# Patient Record
Sex: Male | Born: 1960 | Race: White | Hispanic: No | Marital: Married | Smoking: Former smoker
Health system: Southern US, Community
[De-identification: ages and names within clinical notes are randomized; demographics above are authoritative.]

## PROBLEM LIST (undated history)

## (undated) DIAGNOSIS — J449 Chronic obstructive pulmonary disease, unspecified: Secondary | ICD-10-CM

## (undated) DIAGNOSIS — F419 Anxiety disorder, unspecified: Secondary | ICD-10-CM

## (undated) DIAGNOSIS — G4730 Sleep apnea, unspecified: Secondary | ICD-10-CM

## (undated) DIAGNOSIS — K5792 Diverticulitis of intestine, part unspecified, without perforation or abscess without bleeding: Secondary | ICD-10-CM

## (undated) DIAGNOSIS — IMO0002 Reserved for concepts with insufficient information to code with codable children: Secondary | ICD-10-CM

## (undated) DIAGNOSIS — K219 Gastro-esophageal reflux disease without esophagitis: Secondary | ICD-10-CM

## (undated) DIAGNOSIS — R519 Headache, unspecified: Secondary | ICD-10-CM

## (undated) DIAGNOSIS — I639 Cerebral infarction, unspecified: Secondary | ICD-10-CM

## (undated) DIAGNOSIS — K759 Inflammatory liver disease, unspecified: Secondary | ICD-10-CM

## (undated) DIAGNOSIS — R590 Localized enlarged lymph nodes: Secondary | ICD-10-CM

## (undated) DIAGNOSIS — R51 Headache: Secondary | ICD-10-CM

## (undated) DIAGNOSIS — R569 Unspecified convulsions: Secondary | ICD-10-CM

## (undated) DIAGNOSIS — J45909 Unspecified asthma, uncomplicated: Secondary | ICD-10-CM

## (undated) DIAGNOSIS — I10 Essential (primary) hypertension: Secondary | ICD-10-CM

## (undated) HISTORY — PX: CHOLECYSTECTOMY: SHX55

## (undated) HISTORY — PX: ELBOW SURGERY: SHX618

## (undated) HISTORY — DX: Gastro-esophageal reflux disease without esophagitis: K21.9

## (undated) HISTORY — PX: CERVICAL DISC SURGERY: SHX588

## (undated) HISTORY — DX: Essential (primary) hypertension: I10

## (undated) HISTORY — DX: Reserved for concepts with insufficient information to code with codable children: IMO0002

## (undated) HISTORY — DX: Chronic obstructive pulmonary disease, unspecified: J44.9

---

## 2000-01-30 ENCOUNTER — Encounter: Payer: Self-pay | Admitting: Neurosurgery

## 2000-01-30 ENCOUNTER — Ambulatory Visit (HOSPITAL_COMMUNITY): Admission: RE | Admit: 2000-01-30 | Discharge: 2000-01-30 | Payer: Self-pay | Admitting: Neurosurgery

## 2000-08-19 ENCOUNTER — Emergency Department (HOSPITAL_COMMUNITY): Admission: EM | Admit: 2000-08-19 | Discharge: 2000-08-19 | Payer: Self-pay

## 2000-08-19 ENCOUNTER — Encounter: Payer: Self-pay | Admitting: Neurological Surgery

## 2000-09-30 ENCOUNTER — Ambulatory Visit (HOSPITAL_COMMUNITY): Admission: RE | Admit: 2000-09-30 | Discharge: 2000-09-30 | Payer: Self-pay | Admitting: Neurosurgery

## 2000-09-30 ENCOUNTER — Encounter: Payer: Self-pay | Admitting: Neurosurgery

## 2000-10-11 ENCOUNTER — Encounter: Payer: Self-pay | Admitting: Neurosurgery

## 2000-10-13 ENCOUNTER — Encounter: Payer: Self-pay | Admitting: Neurosurgery

## 2000-10-13 ENCOUNTER — Inpatient Hospital Stay (HOSPITAL_COMMUNITY): Admission: RE | Admit: 2000-10-13 | Discharge: 2000-10-15 | Payer: Self-pay | Admitting: Neurosurgery

## 2000-11-03 ENCOUNTER — Encounter: Payer: Self-pay | Admitting: Neurosurgery

## 2000-11-03 ENCOUNTER — Encounter: Admission: RE | Admit: 2000-11-03 | Discharge: 2000-11-03 | Payer: Self-pay | Admitting: Neurosurgery

## 2003-04-21 ENCOUNTER — Encounter: Payer: Self-pay | Admitting: Emergency Medicine

## 2003-04-21 ENCOUNTER — Inpatient Hospital Stay (HOSPITAL_COMMUNITY): Admission: EM | Admit: 2003-04-21 | Discharge: 2003-04-23 | Payer: Self-pay | Admitting: Emergency Medicine

## 2003-04-22 ENCOUNTER — Encounter: Payer: Self-pay | Admitting: Internal Medicine

## 2003-04-22 ENCOUNTER — Encounter: Payer: Self-pay | Admitting: Cardiology

## 2003-12-18 ENCOUNTER — Inpatient Hospital Stay (HOSPITAL_COMMUNITY): Admission: EM | Admit: 2003-12-18 | Discharge: 2003-12-22 | Payer: Self-pay | Admitting: Emergency Medicine

## 2004-05-09 ENCOUNTER — Ambulatory Visit (HOSPITAL_COMMUNITY): Admission: RE | Admit: 2004-05-09 | Discharge: 2004-05-09 | Payer: Self-pay | Admitting: Neurosurgery

## 2004-05-26 ENCOUNTER — Ambulatory Visit (HOSPITAL_COMMUNITY): Admission: RE | Admit: 2004-05-26 | Discharge: 2004-05-26 | Payer: Self-pay | Admitting: Neurosurgery

## 2004-06-22 ENCOUNTER — Ambulatory Visit (HOSPITAL_COMMUNITY): Admission: RE | Admit: 2004-06-22 | Discharge: 2004-06-22 | Payer: Self-pay | Admitting: Pulmonary Disease

## 2004-06-24 ENCOUNTER — Ambulatory Visit (HOSPITAL_COMMUNITY): Admission: RE | Admit: 2004-06-24 | Discharge: 2004-06-24 | Payer: Self-pay | Admitting: Pulmonary Disease

## 2004-10-27 ENCOUNTER — Emergency Department (HOSPITAL_COMMUNITY): Admission: EM | Admit: 2004-10-27 | Discharge: 2004-10-28 | Payer: Self-pay | Admitting: Emergency Medicine

## 2006-05-01 ENCOUNTER — Ambulatory Visit (HOSPITAL_COMMUNITY): Admission: RE | Admit: 2006-05-01 | Discharge: 2006-05-01 | Payer: Self-pay | Admitting: Pulmonary Disease

## 2006-05-11 ENCOUNTER — Ambulatory Visit (HOSPITAL_COMMUNITY): Admission: RE | Admit: 2006-05-11 | Discharge: 2006-05-11 | Payer: Self-pay | Admitting: Pulmonary Disease

## 2006-08-02 ENCOUNTER — Ambulatory Visit: Payer: Self-pay | Admitting: Cardiology

## 2006-11-24 ENCOUNTER — Emergency Department (HOSPITAL_COMMUNITY): Admission: EM | Admit: 2006-11-24 | Discharge: 2006-11-24 | Payer: Self-pay | Admitting: Emergency Medicine

## 2006-11-27 ENCOUNTER — Ambulatory Visit: Payer: Self-pay | Admitting: Orthopedic Surgery

## 2006-12-11 ENCOUNTER — Ambulatory Visit: Payer: Self-pay | Admitting: Orthopedic Surgery

## 2008-04-29 ENCOUNTER — Ambulatory Visit (HOSPITAL_COMMUNITY): Admission: RE | Admit: 2008-04-29 | Discharge: 2008-04-29 | Payer: Self-pay | Admitting: Family Medicine

## 2009-11-12 ENCOUNTER — Ambulatory Visit (HOSPITAL_COMMUNITY): Admission: RE | Admit: 2009-11-12 | Discharge: 2009-11-12 | Payer: Self-pay | Admitting: Family Medicine

## 2010-10-03 ENCOUNTER — Encounter: Payer: Self-pay | Admitting: Neurosurgery

## 2010-11-17 ENCOUNTER — Ambulatory Visit (HOSPITAL_COMMUNITY)
Admission: RE | Admit: 2010-11-17 | Discharge: 2010-11-17 | Disposition: A | Discharge status: Home / Self Care | Payer: Medicare Other | Source: Ambulatory Visit | Attending: Internal Medicine | Admitting: Internal Medicine

## 2010-11-17 ENCOUNTER — Ambulatory Visit (INDEPENDENT_AMBULATORY_CARE_PROVIDER_SITE_OTHER): Payer: Medicare Other | Admitting: Internal Medicine

## 2010-11-17 ENCOUNTER — Other Ambulatory Visit (INDEPENDENT_AMBULATORY_CARE_PROVIDER_SITE_OTHER): Payer: Self-pay | Admitting: Internal Medicine

## 2010-11-17 DIAGNOSIS — R1310 Dysphagia, unspecified: Secondary | ICD-10-CM

## 2010-11-29 ENCOUNTER — Encounter (HOSPITAL_BASED_OUTPATIENT_CLINIC_OR_DEPARTMENT_OTHER): Payer: Medicare Other | Admitting: Internal Medicine

## 2010-11-29 ENCOUNTER — Ambulatory Visit (HOSPITAL_COMMUNITY)
Admission: RE | Admit: 2010-11-29 | Discharge: 2010-11-29 | Disposition: A | Discharge status: Home / Self Care | Payer: Medicare Other | Source: Ambulatory Visit | Attending: Internal Medicine | Admitting: Internal Medicine

## 2010-11-29 DIAGNOSIS — Z79899 Other long term (current) drug therapy: Secondary | ICD-10-CM | POA: Insufficient documentation

## 2010-11-29 DIAGNOSIS — I10 Essential (primary) hypertension: Secondary | ICD-10-CM | POA: Insufficient documentation

## 2010-11-29 DIAGNOSIS — R1310 Dysphagia, unspecified: Secondary | ICD-10-CM

## 2010-11-29 DIAGNOSIS — J449 Chronic obstructive pulmonary disease, unspecified: Secondary | ICD-10-CM | POA: Insufficient documentation

## 2010-11-29 DIAGNOSIS — K2980 Duodenitis without bleeding: Secondary | ICD-10-CM | POA: Insufficient documentation

## 2010-11-29 DIAGNOSIS — J4489 Other specified chronic obstructive pulmonary disease: Secondary | ICD-10-CM | POA: Insufficient documentation

## 2010-11-29 DIAGNOSIS — E119 Type 2 diabetes mellitus without complications: Secondary | ICD-10-CM | POA: Insufficient documentation

## 2010-11-29 DIAGNOSIS — K2970 Gastritis, unspecified, without bleeding: Secondary | ICD-10-CM | POA: Insufficient documentation

## 2010-11-30 LAB — H. PYLORI ANTIBODY, IGG: H Pylori IgG: <0.4 {ISR}

## 2010-12-14 NOTE — Op Note (Signed)
  NAMEHAYWOOD, [name]               ACCOUNT NO.:  [id number]  MEDICAL RECORD NO.:  [id number]           PATIENT TYPE:  O  LOCATION:  DAYP                          FACILITY:  [facility]  PHYSICIAN:  Lionel December, M.D.    DATE OF BIRTH:  1960-12-15  DATE OF PROCEDURE:  11/29/2010 DATE OF DISCHARGE:                              OPERATIVE REPORT   PROCEDURE:  Esophagogastroduodenoscopy with esophageal dilation.   INDICATIONS:  [name] is a 50 year old Caucasian male with multiple medical problems who has dysphagia primarily to solids.  He had barium study recently which did not show any esophageal abnormality.  The patient is on NSAID therapy among other things.  He is undergoing diagnostic and possibly therapeutic EGD.  Procedure risks were reviewed with the patient and informed consent was obtained.  MEDICATIONS FOR CONSCIOUS SEDATION:  Cetacaine spray for pharyngeal topical anesthesia, Demerol 50 mg IV, and Versed 6 mg IV.  FINDINGS:  Procedure performed in the Endoscopy Suite.  The patient's vital signs and O2 sat were monitored during the procedure and remained stable.  The patient was placed in left lateral recumbent position. Pentax videoscope was passed via oropharynx into laryngopharynx beyond. Laryngeal inlet appeared to be normal with very symmetrical vocal cords.  Esophagus:  Mucosa of the esophagus normal.  There was focal erythema at GE junction which was located at 39 cm from the incisors, but no ring or stricture was noted.  Hiatus was at 41.  Stomach:  It was empty and distended, very well insufflation.  Folds of proximal stomach were normal.  Examination of mucosa at body was normal. There were multiple antral erosions along with 3-4 mm prepyloric ulcer which appeared to be healing.  There was also edema and erythema to mucosa of pyloric channel, but it was patent.  Angularis, fundus, and cardia were examined by retroflexion of scope and were normal.  Duodenum:  Few  scattered bulbar erosions were noted, but no ulcer crater was present.  Scope was passed in second part of duodenal mucosa and folds were normal.  Endoscope was withdrawn.  Esophageal dilation was performed by passing 56-French Maloney dilator full insertion.  Esophageal mucosa reexamined postdilation and no disruption noted.  The patient tolerated the procedure well.  Small sliding hiatal hernia with mild changes of esophagitis limited to GE junction.  No evidence of the ring, stricture or web.  Nevertheless esophagus dilated by passing 56-French [facility] dilator.  Erosive antral gastritis and bulbar duodenitis along with 3-4 mm prepyloric ulcer.  Suspect this may be due to NSAID therapy, but need to rule out H. pylori infection.  RECOMMENDATIONS:  Antireflux measures.  The patient is advised to cut back on the dose of naproxen at least on Sundays.  We will check his H. pylori serology today.  Pantoprazole 40 mg p.o. q.a.m., prescription given for 30 with 11 refills.     Lionel December, M.D.     NR/MEDQ  D:  11/29/2010  T:  11/29/2010  Job:  [id number]  Electronically Signed by Lionel December M.D. on 12/14/2010 12:44:54 PM

## 2010-12-14 NOTE — Consult Note (Signed)
[name], [name]               ACCOUNT NO.:  [id number]  MEDICAL RECORD NO.:  [id number]           PATIENT TYPE:  LOCATION:                                 FACILITY:  PHYSICIAN:  Lionel December, M.D.    DATE OF BIRTH:  [birth year]  DATE OF CONSULTATION: DATE OF DISCHARGE:                                CONSULTATION   HISTORY OF PRESENT ILLNESS:  [name] is a 50 year old male referred to our office by Dr. Regino Schultze for dysphagia.  [name] [name] states he has had symptoms of dysphagia for a couple of years.  His symptoms have been worse in the past 6 months.  He states he is choking checking on his vitamins and meats are lodging.  He has to cough the bolus back up. Foods are lodging in his upper esophagus.  Please note, he has had neck surgery x4 in the past.  He complains of a lot of pressure in his upper esophagus.  He says dysphagia is occurring with most every meal.  He has had a 10-15 pound weight loss over the past 6 months, but is gradually putting it back on.  He says his appetite is good.  He does, however, state he at times chokes on his saliva.  He occasionally has heartburn. No fever or fatigue.  ALLERGIES:  No known allergies.  HOME MEDICATIONS: 1. Ventolin 2 puffs as needed. 2. Xanax 2 mg 1 tablet every 6 hours as needed. 3. OxyContin 40 mg 1 tablet 3 times a day. 4. Oxycodone 15 mg every 4 hours as needed. 5. Carbamazepine 200 mg 1 tablet twice a day. 6. Cipro 500 mg 1 tablet twice a day for prostatitis. 7. Prednisone 5 mg a day. 8. Lisinopril 10 mg a day. 9. Tizanidine 4 mg 1 tablet 3 times a day. 10.Naproxen 500 mg twice a day. 11.Spiriva nebulizer 18 mcg once a day.  SURGERIES:  He has had neck surgery x4.  He has had a cholecystectomy and he had a joint removed from his right elbow.  MEDICAL HISTORY: 1. Hypertension. 2. COPD. 3. Chronic back pain. 4. Prostatitis. 5. Seizures.  FAMILY HISTORY:  His mother is alive in good health.  His father is alive  in good health with hypertension.  Two sisters in good health.  SOCIAL HISTORY:  He is married.  He does, however, have a good relationship with his ex-wife.  He smokes 1 pack a day for 40 years.  He rarely drinks alcohol and he has 3 children in good health.  OBJECTIVE:  VITAL SIGNS:  His weight is 184.  His height is 5 feet and 8 inches.  His temperature is 98.5.  his blood pressure is 160/94.  His pulse is 80. HEENT:   His conjunctivae is pink. His sclerae are anicteric. NECK:  His thyroid is normal.  There is no cervical lymphadenopathy. LUNGS:  Clear. HEART:  Regular rate and rhythm. ABDOMEN:  Soft.  Bowel sounds are positive.  No masses. EXTREMITIES:  There is no edema to his extremities.  LABORATORY DATA:  On September 17, 2010, his hemoglobin was 15.8 and hematocrit 46.8.  ASSESSMENT:  [name] [name] is a 50 year old male presenting today with complaints of dysphagia to solids and sometimes liquids.  He also has pill dysphagia.  This has been occurring for over 2 years which has progressively worsened over the last 6 months, with weight loss.  RECOMMENDATIONS:  I did discuss this case with Dr. Karilyn Cota.  We will get a barium pill study on him and further recommendations once we have the study back and he will be scheduled today for this.    ______________________________ Dorene Ar, NP   ______________________________ Lionel December, M.D.    TS/MEDQ  D:  11/17/2010  T:  11/18/2010  Job:  [id number]  cc:   Kirk Ruths, M.D. Fax: [phone]  Electronically Signed by Dorene Ar PA on 12/03/2010 11:40:39 AM Electronically Signed by Lionel December M.D. on 12/14/2010 12:43:59 PM

## 2011-01-28 NOTE — Consult Note (Signed)
NAME:  [name] [name], [name] [name]                         ACCOUNT NO.:  [id number]   MEDICAL RECORD NO.:  [id number]                   PATIENT TYPE:  INP   LOCATION:  3731                                 FACILITY:  [facility]   PHYSICIAN:  Meade Maw, M.D.                 DATE OF BIRTH:  05/24/1961   DATE OF CONSULTATION:  DATE OF DISCHARGE:                                   CONSULTATION   PRIMARY CARE PHYSICIAN:  Donia Guiles, M.D.   INDICATION FOR CONSULT:  Chest pain.   HISTORY OF PRESENT ILLNESS:  [name] [name] is a 50 year old gentleman who presented  to the emergency room with ongoing substernal chest pain.  He notes that the  first episode of chest pain actually occurred approximately eight months  prior to this presentation.  It resolved in approximately one-and-a-half  hours spontaneously and was described as a 3/10 in severity.  He had a  subsequent episode on Sunday which persisted for about 45 minutes,  associated with dizziness and resolved spontaneously in one-and-a-half  hours.  He subsequently had another episode which was more severe in nature  and prompted his presentation to the ER.  There was again associated  dizziness and diaphoresis, but no nausea or vomiting.  His coronary risk  factors are significant for male sex, equivocal family history (father had  myocardial infarction at age 52), and ongoing tobacco use.  His cholesterol  status is unknown.  There is no history of hypertension or diabetes.   PAST MEDICAL HISTORY:  Significant for sleep apnea which was diagnosed in  2001.   PAST SURGICAL HISTORY:  Significant for:  1. Cervical fusion in 1995, 1997, and 1998.  2. Status post cholecystectomy in 1994.   CURRENT MEDICATIONS:  1. Vioxx 25 mg daily.  2. Valium 5 mg p.r.n.   ALLERGIES:  He has no known drug allergies.   FAMILY HISTORY:  His father had myocardial infarction at age 86.  Two  sisters are in good health.   SOCIAL HISTORY:  He continues to smoke one pack  per day.  Occasional  alcohol.  He has been disabled since 1995 and is not involved in any regular  exercise program.   REVIEW OF SYSTEMS:  He has noted no significant gastritis.  Denies nausea  and vomiting.  No change in his stool.  Review of systems is otherwise  negative.   PHYSICAL EXAMINATION:  GENERAL APPEARANCE:  A 50 year old male appearing  older than his stated age.  VITAL SIGNS:  The blood pressure is 110-140 systolic and in the 70s  diastolic.  The heart rate is 60.  His O2 saturation is 100% on 1.5 L.  HEENT:  He has limited range of motion of his neck.  There is no neck vein  distention.  No carotid bruits are noted.  CARDIOVASCULAR:  Regular rate and rhythm.  Normal S1 and S2.  No murmurs,  rubs, or gallops are noted.  His PMI is not displaced.  ABDOMEN:  Soft, benign, and nontender.  No unusual bruits or pulsations are  noted.  EXTREMITIES:  No peripheral edema.  His distal pulses are equal and  palpable.   LABORATORY DATA:  His ECG reveals a sinus rhythm, normal axis, normal R-wave  progression, and no ST changes.  Serial cardiac enzymes have been negative.  His white count is 7.6 and hemoglobin 15.  He has normal electrolytes.   IMPRESSION:  1. A 50 year old gentleman with recurrent chest pain.  Limited risk factors.     The chest pain has been relieved, however, during this admission with     sublingual nitroglycerin.  In that he has normal EKG and normal cardiac     enzymes, we will schedule for an adenosine Cardiolite for further     evaluation.  The patient is unable to ambulate on the treadmill secondary     to his neck fusion.  I agree with Pepcid 20 mg p.o. b.i.d.  Will continue     with Lovenox and aspirin as you have ordered.  2. Ongoing tobacco use.  Smoking cessation was strongly discussed.  Will     obtain a smoking cessation consult as well.  3. Health maintenance.  The patient will need a fasting lipid profile.                                                Meade Maw, M.D.    HP/MEDQ  D:  04/22/2003  T:  04/22/2003  Job:  [id number]

## 2011-01-28 NOTE — H&P (Signed)
[place]  Patient:    [name] [name], [name] [name]                      MRN: [id number] Adm. Date:  [id number] Attending:  Armanda Heritage CC:         [facility]   History and Physical  EMERGENCY ROOM VISIT  CHIEF COMPLAINT:  Larey Seat in the bathtub this morning.  HISTORY OF PRESENT ILLNESS:  The patient is a 50 year old right-handed individual who has had previous neck surgeries, the last being in 1999.  He has a C4-5 arthrodesis with a plate fixation (performed by Dr. Payton Doughty). He also has other arthrodesis in the lower cervical spine.  The patient notes that he slipped and fell in the bathtub, hitting the left side of his head.  He complains of pain in the region of the right shoulder and the interscapular region on the right side, as well as some occasional dysesthesias in the right arm and hand.  He contacted me this afternoon and I advised the patient to come in for some x-rays of his neck.  He notes that his neck is particularly tender in the posterior aspect of the neck and down to the region of both shoulders, and the interscapular area.  He denies any lower extremity dysfunction or any problems with bowel or bladder control.  PAST MEDICAL HISTORY:  Notable that the patient is generally healthy.  He reports no significant medical problems, no chronic medication usage.  PHYSICAL EXAMINATION:  GENERAL:  Reveals an alert and oriented individual, in no overt distress.  NECK:  Tenderness to palpation in the right parascapular areas and interscapular region; also on the right midline portion of the neck.  NEUROLOGIC:  Motor strength in the upper extremities reveals the deltoids, triceps, biceps grips to be 4/5 on the right side and 5/5 on the left.  The deep tendon reflexes are 2+ in the biceps, 1+ in the triceps, 2+ in the patellae, 1+ in the Achilles.  Babinski downgoing.  Station and gait are normal.  Cranial nerve  examination is within normal limits.  RADIOGRAPHIC:  The examination reveals that his arthrodesis from C4 on down appears to be stable.  No abnormal motion is detected.  However, on flexion and extension films the patient does not allow for significant motion in the upper portions of his cervical spine.  RECOMMENDATION:  I advised at this time that the patient maintain himself in the cervical collar.  He has Vicodin and Valium at home for pain medication and muscle relaxer.  He will be in contact with the office during this coming week, if by Tuesday he does not have significant relief of his symptoms or if he feels the symptoms are worsening. DD:  08/19/00 TD:  08/20/00 Job: [id number]

## 2011-01-28 NOTE — Group Therapy Note (Signed)
NAME:  [name] [name], [name] [name]                         ACCOUNT NO.:  [id number]   MEDICAL RECORD NO.:  [id number]                   PATIENT TYPE:  INP   LOCATION:  A213                                 FACILITY:  [facility]   PHYSICIAN:  Edward L. Juanetta Gosling, M.D.             DATE OF BIRTH:  Sep 16, 1960   DATE OF PROCEDURE:  12/20/2003  DATE OF DISCHARGE:                                   PROGRESS NOTE   PROBLEM:  Chronic obstructive pulmonary disease with exacerbation.   SUBJECTIVE:  [name] says that he is better, but still having a lot of  problems with cough, congestion, etc.   OBJECTIVE:  His exam today shows that his temperature is 97.4, pulse 103,  respirations 18, blood pressure 137/76.  O2 saturation 95% on 2-1/2 liters.  His chest shows rhonchi and it makes him cough when he takes a deep breath.   ASSESSMENT:  He is about the same.   PLAN:  Continue treatments including his O2 nebulizer treatments, steroids,  and antibiotics.      ___________________________________________                                            Oneal Deputy Juanetta Gosling, M.D.   ELH/MEDQ  D:  12/20/2003  T:  12/20/2003  Job:  [id number]

## 2011-01-28 NOTE — H&P (Signed)
NAME:  [name] [name], [name] [name]                         ACCOUNT NO.:  [id number]   MEDICAL RECORD NO.:  [id number]                   PATIENT TYPE:  INP   LOCATION:  A213                                 FACILITY:  [facility]   PHYSICIAN:  Tesfaye D. Felecia Shelling, M.D.              DATE OF BIRTH:  Jul 21, 1961   DATE OF ADMISSION:  12/18/2003  DATE OF DISCHARGE:                                HISTORY & PHYSICAL   CHIEF COMPLAINT:  Chest pain.   HISTORY OF PRESENT ILLNESS:  This is a 50 year old male patient with a  history of seizure disorder and bronchitis.  He came to the emergency room  with the above complaint.  The patient claims that he had a severe chest  pain since last three days.  The pain is in his mid sternal area.  The  patient feels like something is sitting on his chest.  He has also tightness  and difficulty breathing.  The patient has intermittent cough with yellowish  phlegm.  He has a history of tobacco smoking for several years.  The patient  had a similar episode in July of last year for which he was admitted to  [facility] and was evaluated.  The patient was diagnosed as a  possible case of bronchitis with a possible bronchospasm, and esophagitis.  He was discharged for Protonix 40 mg p.o. daily.  However, the patient is  not currently taking the medication.  He continued to smoke cigarettes  heavily.  The patient was evaluated in the emergency room and a CT scan of  the chest was done to rule out thoracic aneurysm.  His CT scan of the chest  was negative.  The patient had cardiac enzymes and EKG which was also  negative.  The patient felt better after he was given nitroglycerin and  oxygen in the emergency room.  The patient was then admitted for further  evaluation and treatment.   REVIEW OF SYSTEMS:  The patient has no headache, fever, chills or abdominal  pain.  No nausea, dysuria, urgency, or frequency of urination.  The patient  complains of tightness of the chest, cough  and intermittent wheezing.   PAST MEDICAL HISTORY:  1. History of recurrent chest pain.  2. History of esophagitis.  3. Bronchitis.  4. Seizure disorder.  5. C3-C4 disk prolapse.   CURRENT MEDICATIONS:  1. Carbatrol 200 mg p.o. daily.  2. Valium p.r.n.  3. Vicodin 5/500, one tablet p.o. p.r.n.   SOCIAL HISTORY:  The patient has been smoking for over 35 years.  The  patient denies any history of alcohol or substance abuse.   FAMILY HISTORY:  The patient's father died at the age of 33 due to  myocardial infarction.  His mother has no cardiac history.   PHYSICAL EXAMINATION:  GENERAL:  The patient is alert, awake, sick looking.  VITAL SIGNS:  Blood pressure 113/77, pulse 85, respiratory rate  24,  temperature 98 degrees Fahrenheit.  HEENT:  Pupils are equal and reactive.  NECK:  Supple.  CHEST:  Decreased air entry, bilateral rhonchi and expiratory wheezes.  CARDIOVASCULAR:  First and second heart sounds heard, no murmur, no gallop.  ABDOMEN:  Soft, relaxed.  Bowel sounds positive.  No mass, no organomegaly.  EXTREMITIES:  No leg edema.   LABORATORY DATA:  CBC:  WBC 6.4, hemoglobin 17.0, hematocrit 38.4.  Platelets 241.  Sodium 136, potassium 3.7, chloride 106, carbon dioxide 22,  glucose 104, BUN 11, creatinine 1.0.  CPK 86, CK-MB 1.2, and troponin less  than 0.01.   ASSESSMENT:  1. Chest pain, etiology unclear, rule out unstable angina.  However,     possibility of gastroesophageal reflux disease with esophagitis has to be     considered.  2. Possible chronic obstructive pulmonary disease.  3. History of seizure disorder.  4. History of discogenic disease.  5. Nicotine addition.   PLAN:  1. We will continue serial EKG's and cardiac enzymes.  2. We will start the patient on nebulizer treatment and oral antibiotics.  3. We will start the patient also on proton pump inhibitors.  4. We will continue the patient on his regular medications.      ___________________________________________                                         Eustaquio Maize Felecia Shelling, M.D.   TDF/MEDQ  D:  12/18/2003  T:  12/19/2003  Job:  [id number]

## 2011-01-28 NOTE — Discharge Summary (Signed)
NAME:  [name] [name], BHATNAGAR                         ACCOUNT NO.:  [id number]   MEDICAL RECORD NO.:  [id number]                   PATIENT TYPE:  INP   LOCATION:  A213                                 FACILITY:  [facility]   PHYSICIAN:  Edward L. Juanetta Gosling, M.D.             DATE OF BIRTH:  1961/01/19   DATE OF ADMISSION:  12/18/2003  DATE OF DISCHARGE:                                 DISCHARGE SUMMARY   FINAL DISCHARGE DIAGNOSES:  1. Chronic obstructive pulmonary disease with exacerbation.  2. Chest pain, secondary to #1.  3. Myocardial infarction ruled out.  4. Seizure disorder.  5. Chronic back pain.  6. Gastroesophageal reflux disease.  7. C3-C4 disk prolapse.   HISTORY:  This is a 50 year old with a history of seizure disorder and  bronchitis.  He came to the emergency room because of chest pain for about 3  days which is in the midsternal area and feels like someone is sitting on  his chest.  He had a similar episode in July 2004 and was worked up and had  no evidence of myocardial infarction or of coronary disease at all.  He was  felt to have bronchitis with bronchospasm and esophagitis discharged on  Protonix which he stopped.  He has continued to smoke about 3 packs of  cigarettes daily.   PHYSICAL EXAMINATION:  GENERAL:  His physical exam on admission showed that  he is alert, awake, acute ill.  VITAL SIGNS:  The blood pressure 113/77, pulse is 85, respirations 24,  temperature 98.  CHEST:  His chest showed decreased breath sounds with some rhonchi and  wheezes.  HEART:  Heart is regular without gallop.  ABDOMEN:  Soft.  EXTREMITIES:  Extremities showed no edema.   LAB WORK:  Showed that his white count is 6400, hemoglobin 17.  Electrolytes  are normal.  CK 86, MB of 1.2, troponin less than 0.01.   HOSPITAL COURSE:  He was ruled out for myocardial infarction with serial  cardiac enzymes.  He was placed on intravenous antibiotics, IV steroids and  inhaled bronchodilators and  improved.  He remained hypoxic; however, and is  discharged home.   DISCHARGE MEDICATIONS:  1. Levaquin 500 mg daily x7 days.  2. A Medrol Dosepak.  3. Albuterol and Atrovent nebulizer treatments 4 times a day.  4. O2 at 2-1/2 to 3 liters.  He is going to continue his previous medications which include:  1. Carbatrol 200 mg daily.  2. Valium 10 mg up to 6 times a day.  3. Hydrocodone q.4h. p.r.n.   DISCHARGE INSTRUCTIONS:  He is going to follow up in my office in about a  month.     ___________________________________________  Edward L. Juanetta Gosling, M.D.   ELH/MEDQ  D:  12/22/2003  T:  12/22/2003  Job:  [id number]

## 2011-01-28 NOTE — Group Therapy Note (Signed)
NAME:  AMED, DATTA                         ACCOUNT NO.:  [id number]   MEDICAL RECORD NO.:  [id number]                   PATIENT TYPE:  INP   LOCATION:  A213                                 FACILITY:  [facility]   PHYSICIAN:  Edward L. Juanetta Gosling, M.D.             DATE OF BIRTH:  July 13, 1961   DATE OF PROCEDURE:  DATE OF DISCHARGE:                                   PROGRESS NOTE   PROBLEM:  Chronic obstructive pulmonary disease with exacerbation.   SUBJECTIVE:  Mr. Orourke continues to cough and be congested, but overall  feels better and wants to go home.  He has no other new complaints today.  His chest pain has resolved.   PHYSICAL EXAMINATION:  GENERAL:  He still appears to be mildly dyspneic, but  he says that he is back to baseline for him.  VITAL SIGNS:  Temperature 98.9; pulse 97; respirations 20; blood pressure  130/76; O2 sats 97% on three liters, but 87% on room air.  CHEST:  Clearer, but still with some rhonchi.  HEART:  Regular.  ABDOMEN:  Soft.   ASSESSMENT:  He is better.   PLAN:  He is going to be discharged home today.  Please see discharge  summary for details.      ___________________________________________                                            Oneal Deputy Juanetta Gosling, M.D.   ELH/MEDQ  D:  12/22/2003  T:  12/22/2003  Job:  [id number]

## 2011-01-28 NOTE — H&P (Signed)
NAME:  [name] [name], [name] [name]                         ACCOUNT NO.:  [id number]   MEDICAL RECORD NO.:  [id number]                   PATIENT TYPE:  INP   LOCATION:  3731                                 FACILITY:  [facility]   PHYSICIAN:  Sherin Quarry, MD                   DATE OF BIRTH:  1960/10/13   DATE OF ADMISSION:  04/21/2003  DATE OF DISCHARGE:                                HISTORY & PHYSICAL   PROBLEM LIST:  1. Chest pain, rule out myocardial infarction.  2. A 35 pack year smoking history.  3. Family history of myocardial infarction.  4. History of four previous cervical laminectomies, most recently in 2001.   HISTORY OF PRESENT ILLNESS:  This 50 year old man reports that on the night  prior to admission before going to sleep he noted the sudden onset of  midsternal chest pain associated with dizziness that lasted for about 1-1/2  hours and then resolved spontaneously.  He felt slightly nauseated during  this period.  He went to sleep and felt well when he got up.  Then this  evening while riding in a car, he noted the acute recurrence of chest pain  with associated diaphoresis and dizziness again described as substernal.  This episode lasted about 45 minutes, and resulted in his wife bringing him  to the emergency room where his evaluation has included normal labs, normal  enzymes, and negative electrocardiogram.  He is admitted to rule out  myocardial infarction.  His wife reports that about eight months prior to  this presentation, he had an episode of chest pain, also which apparently  was not associated with exertion, and lasted for about 30 to 45 minutes.  He  mentioned this to his wife, but did not want to see a doctor about it.   ALLERGIES:  No known drug allergies.   CURRENT MEDICATIONS:  1. Vioxx 25 mg daily.  2. Valium 5 mg p.r.n. for anxiety.  3. Occasional multivitamins.   PAST MEDICAL HISTORY:  He has had no serious illnesses.   PAST SURGICAL HISTORY:  The patient  reports he has had four previous  cervical laminectomies performed by Dr. Channing Mutters, most recently in 2001.   FAMILY HISTORY:  The patient's father had a MI at age 52 and underwent a  stent.  His brother and sister are said to be in good health.  His mother  does not have any history of cardiac disease.   SOCIAL HISTORY:  The patient smokes one pack of cigarettes a day.  He denies  alcohol or drug abuse.  He has been disabled since 1995, due to neck  problems.   REVIEW OF SYSTEMS:  HEAD:  He denies headache or dizziness.  EYES:  He  denies visual blurring or diplopia.  EARS, NOSE, AND THROAT:  He denies  earaches, sinus pain, or sore throat.  CHEST:  Denies coughing or wheezing.  He has not noticed any previous history of dyspnea on exertion.  CARDIOVASCULAR:  See above.  GASTROINTESTINAL:  There has been no recent  history of indigestion or heartburn.  No melena or hematochezia.  GENITOURINARY:  Denies dysuria or urinary frequency.  RHEUMATOLOGIC:  Denies  back pain or joint pain.  HEMATOLOGIC:  He denies easy bleeding or bruising.  NEUROLOGIC:  He denies history of seizure or stroke.   PHYSICAL EXAMINATION:  HEENT:  Within normal limits.  NECK:  The carotids are 2+ without bruits.  CHEST:  Clear.  BACK:  No severe point tenderness.  CARDIOVASCULAR:  Normal S1 and S2.  There are no murmurs, rubs, or gallops.  ABDOMEN:  Benign, normal bowel sounds without masses, tenderness, or  organomegaly.  NEUROLOGIC:  Normal.  EXTREMITIES:  Normal.   LABORATORY DATA:  Laboratory studies were reviewed and included negative  cardiac enzymes.  Electrocardiogram was within normal limits.   ASSESSMENT AND PLAN:  The patient will be admitted to rule out myocardial  infarction.  He is instructed in the importance of discontinuing cigarette  smoking.  A Cardiolite stress will be arranged if enzymes are negative to  further evaluate his heart status, and lipid profile will also be checked.                                                 Sherin Quarry, MD    SY/MEDQ  D:  04/21/2003  T:  04/22/2003  Job:  [id number]   cc:   Donia Guiles, M.D.  [address] [place]  [place] [address]  Fax: [phone]   Lyn Records III, M.D.  [address]  [place]  [place] [address]  Fax: [phone]

## 2011-01-28 NOTE — Procedures (Signed)
NAMEKAIYON, HYNES               ACCOUNT NO.:  [id number]   MEDICAL RECORD NO.:  [id number]          PATIENT TYPE:  OUT   LOCATION:  RESP                          FACILITY:  [facility]   PHYSICIAN:  Edward L. Juanetta Gosling, M.D.DATE OF BIRTH:  Jan 08, 1961   DATE OF PROCEDURE:  06/24/2004  DATE OF DISCHARGE:                              PULMONARY FUNCTION TEST   RESULTS:  1.  Spirometry shows a severe ventilatory defect with evidence of air flow      obstruction.  2.  Lung volumes show reduction in total lung capacity suggesting a separate      restrictive change.  3.  DLCO is near normal which makes me wonder if the total lung capacity      measurement is accurate.  4.  There is significant response to inhaled bronchodilator.  5.  Arterial blood gas is normal on 3 L of O2.     Edwa   ELH/MEDQ  D:  06/24/2004  T:  06/24/2004  Job:  [id number]

## 2011-01-28 NOTE — H&P (Signed)
[place]. [facility]  Patient:    [name] [name], [name] [name]                        MRN: [id number] Adm. Date:  10/13/00 Attending:  Payton Doughty, M.D.                         History and Physical  ADMISSION DIAGNOSIS:  Herniated disk at C3-4.  HISTORY OF PRESENT ILLNESS:  This is a 50 year old right-handed white gentleman who has had a C3-4, C4-5, C5-6, and C6-7 anterior cervical diskectomy and fusion in the past.  Postoperatively, he has done well.  He fell in the bathtub in December and has had increasing pain in his neck and out his right arm and he presents now with a herniated disk at C3-4 for an anterior cervical diskectomy and fusion.  PAST MEDICAL HISTORY:  He has no significant medical problems.  He is not on any medication at this time other than p.r.n. pain medicines.  ALLERGIES:  No known drug allergies.  PAST SURGICAL HISTORY:  He had a cholecystectomy in March of 1993.  SOCIAL HISTORY:  He smokes a pack of cigarettes a day and drinks alcohol socially.  FAMILY HISTORY:  Mother is 47 and in good health.  Father is 9 and in good health.  REVIEW OF SYSTEMS:  Noncontributory for bowel or bladder dysfunction.  He does have neck and arm pain.  PHYSICAL EXAMINATION:  HEENT:  Within normal limits.  NECK:  Limited range of motion with pain turning in either direction.  CHEST:  Diffuse crackles.  HEART:  Regular rate and rhythm.  ABDOMEN:  Nontender with no hepatosplenomegaly.  EXTREMITIES:  Without clubbing, cyanosis, or edema.  GENITOURINARY:  Deferred.  Peripheral pulses are good.  NEUROLOGICAL:  He is awake, alert, and oriented.  Cranial nerves II-XII intact.  Motor examination shows 5/5 strength throughout the upper and lower extremities.  There is no current sensory deficit.  Reflexes are 1 throughout the upper extremities, 1 at the knees, 1 at the ankles.  Toes are downgoing bilaterally.  Hoffmans is negative.  His MRI shows a large  herniated disk at C3-4 slightly eccentric to the left with a large midline component.  IMPRESSION:  Herniated disk at C3-4 with intractable neck pain.  PLAN:  Anterior cervical diskectomy and fusion at C3-4.  The risks and benefits of this approach have been discussed with him and he wishes to proceed. DD:  10/13/00 TD:  10/14/00 Job: [id number]

## 2011-01-28 NOTE — Group Therapy Note (Signed)
NAME:  [name] [name], [name] [name]                         ACCOUNT NO.:  [id number]   MEDICAL RECORD NO.:  [id number]                   PATIENT TYPE:  INP   LOCATION:  A213                                 FACILITY:  [facility]   PHYSICIAN:  Edward L. Juanetta Gosling, M.D.             DATE OF BIRTH:  [birth year]   DATE OF PROCEDURE:  DATE OF DISCHARGE:                                   PROGRESS NOTE   PROBLEM:  Chronic obstructive pulmonary disease with exacerbation.   SUBJECTIVE:  [name] [name] is a little better.  His medications were not how  he was taking them at home and I think that he actually had some  benzodiazepine withdrawal syndrome.  He says that he is feeling a little  better now.  He got up last night and fell.   PHYSICAL EXAMINATION:  VITAL SIGNS:  Temperature 96.7; pulse 96;  respirations 20; blood pressure 124/65; O2 sat 95% on 2.5 liters and it  drops when he takes his oxygen off.  CHEST:  Fairly clear, but with decreased breath sounds and rhonchi when he  coughs.  HEART:  Regular.  ABDOMEN:  Soft, but he is complaining of nausea now.   ASSESSMENT:  He continues to be quite sick.   PLAN:  To continue with his current treatments, including nebulizers,  antibiotics, etc.  I have changed his medications for his anxiety to try to  get it more like what he is taking at home.      ___________________________________________                                            Oneal Deputy. Juanetta Gosling, M.D.   ELH/MEDQ  D:  12/21/2003  T:  12/22/2003  Job:  [id number]

## 2011-01-28 NOTE — Group Therapy Note (Signed)
NAME:  [name], [name]                         ACCOUNT NO.:  [id number]   MEDICAL RECORD NO.:  [id number]                   PATIENT TYPE:  INP   LOCATION:  A213                                 FACILITY:  [facility]   PHYSICIAN:  Edward L. Juanetta Gosling, M.D.             DATE OF BIRTH:  [birth year]   DATE OF PROCEDURE:  DATE OF DISCHARGE:                                   PROGRESS NOTE   PROBLEM:  Chronic obstructive pulmonary disease with exacerbation.   SUBJECTIVE:  [name] says he feels better.  He has a lot less  congestion.  He has no other complaints except his IV is painful.   OBJECTIVE:  His exam shows his temperature is 97.1, pulse 85, respirations  20.  Blood pressure 126/71.  O2 saturations are 99%.  His chest is fairly  clear.  His heart is regular.  His abdomen is soft.  Extremities showed no  edema.   ASSESSMENT:  He is much improved.   PLAN:  Move his IV.  I am going to go ahead and put him on Rocephin as well  as his other antibiotics.  I am going to give him some intravenous steroids.      ___________________________________________                                            Oneal Deputy Juanetta Gosling, M.D.   ELH/MEDQ  D:  12/19/2003  T:  12/19/2003  Job:  [id number]

## 2011-01-28 NOTE — Op Note (Signed)
[place]. [facility]  Patient:    [name] [name], [name] [name]                      MRN: [id number] Proc. Date: 10/13/00 Adm. Date:  [id number] Disc. Date: [id number] Attending:  Emeterio Reeve                           Operative Report  PREOPERATIVE DIAGNOSIS:  Herniated disk at C3-C4.  POSTOPERATIVE DIAGNOSIS:  Herniated disk at C3-C4.  OPERATION PERFORMED:  C3-C4 anterior cervical diskectomy and fusion with tether plate.  SURGEON:  Payton Doughty, M.D.  ANESTHESIA:  General endotracheal.  PREP:  Sterile Betadine prep and scrub with alcohol wipe.  COMPLICATIONS:  None.  ASSISTANT:    Stefani Dama, M.D.  DESCRIPTION OF PROCEDURE:  The patient is a 50 year old right-handed white gentleman with C3-4 disk.  He had had previous fusions at C4-5, C5-6 and C6-7. The patient was taken to the operating room, smoothly anesthetized and intubated, and placed supine on the operating table.  Following shave, prep and drape in the usual sterile fashion, the skin was incised in the midline at the medial border of the sternocleidomastoid on the left side.  Platysma was identified, elevated, divided and undermined.  Sternocleidomastoid was identified and medial dissection revealed a carotid artery at its bifurcation and it was retracted laterally.  The trachea and esophagus retracted laterally to the right, exposing the bones of the anterior cervical spine.  A marker was placed and intraoperative x-ray was obtained to confirm correctness of level. Having confirmed correctness of level, the longus colli was taken down bilaterally, slightly above and below the disk space and the disk removed grossly.  Then the operating microscope was brought in and working through the operating microscope, the anterior epidural space was dissected and herniated disk was removed.  The disk itself had not penetrated the posterior longitudinal ligament but had simply elevated it.  Following  division of the posterior longitudinal ligament removing all disk material, the neural foramina were carefully explored and found to be open.  Bleeding was controlled with thrombin-soaked Gelfoam.  A 7 mm bone graft was fashioned from patellar allograft and tapped into place.  A 14 mm tether plate was then placed with 12 mm screws, two in C3 and two in C4.  Intraoperative x-ray showed good placement of the plate, bone graft and screws.  The wound was irrigated and hemostasis ensured.  The platysma was reapproximated with 3-0 Vicryl in interrupted fashion and subcutaneous tissues reapproximated with 3-0 Vicryl in interrupted.  The skin was closed with 4-0 Vicryl in running subcuticular fashion.  Benzoin and Steri-Strips were placed and made occlusive with Telfa and Op-Site.  The patient was then placed in a [place] collar and returned to the recovery room in good condition. DD:  10/16/00 TD:  10/16/00 Job: [id number]

## 2011-01-28 NOTE — Procedures (Signed)
[name] [name], [name] [name]               ACCOUNT NO.:  [id number]   MEDICAL RECORD NO.:  [id number]          PATIENT TYPE:  OUT   LOCATION:  RAD                           FACILITY:  [facility]   PHYSICIAN:  Vida Roller, M.D.   DATE OF BIRTH:  1961/07/10   DATE OF PROCEDURE:  06/22/2004  DATE OF DISCHARGE:                                  ECHOCARDIOGRAM   PRIMARY CARE PHYSICIAN:  Dr. Ramon Dredge L. Hawkins.   TAPE NUMBER:  LB [id number], tape count 4118 through [id number].   INDICATION:  A 50 year old man with shortness of breath.  No previous  echocardiograms.   TECHNICAL QUALITY:  Adequate.   M-MODE TRACINGS:  1.  The aorta is 30 mm.  2.  The left atrium is 43 mm.  3.  The septum is 10 mm.  4.  Posterior wall is 10 mm.  5.  Diastolic dimension is 44 mm.  6.  Left ventricular systolic dimension is 31 mm.   2-D AND DOPPLER IMAGING:  1.  The left ventricle is normal size with normal systolic function.      Estimated ejection fraction is 60-65%.  There is no left ventricular      hypertrophy.  There are no wall-motion abnormalities.  Diastolic      function was not assessed.  2.  The right ventricle is top normal in size with normal systolic function.  3.  Both atria appear to be slightly enlarged, the left greater than right.  4.  The aortic valve is morphologically unremarkable with no stenosis or      regurgitation.  5.  The mitral valve is normal with trivial insufficiency. No stenosis is      seen.  6.  The tricuspid valve is unremarkable with no stenosis or regurgitation.  7.  The pulmonic valve is not well seen.  8.  The pericardial structures are without effusion.  9.  Inferior vena cava appears to be normal size.  10. The ascending aorta is not well seen.     Trey Paula   JH/MEDQ  D:  06/22/2004  T:  06/22/2004  Job:  [id number]

## 2011-01-28 NOTE — Procedures (Signed)
[place]               ACCOUNT NO.:  [id number]   MEDICAL RECORD NO.:  [id number]          PATIENT TYPE:  OUT   LOCATION:                                FACILITY:  [facility]   PHYSICIAN:  Dani Gobble, MD       DATE OF BIRTH:  1961/06/23   DATE OF PROCEDURE:  DATE OF DISCHARGE:                                  ECHOCARDIOGRAM   REFERRING:  Dr. Juanetta Gosling, Dr. Domingo Sep   INDICATION:  A 50 year old gentleman with TIAs without prior cardiac  history.   The technical quality of the study is reasonable.   The aorta measures normally at 2.8 cm.   The left atrium also measures normally at 3.7 cm.  The patient appeared to  be in sinus rhythm with frequent ectopic beats.  No obvious clots or masses  were noted but this study is not adequate to assess for subtle findings.   The interventricular septum and posterior wall are within normal limits at  1.0 cm for each.   The aortic valve is trileaflet with normal leaflet excursion.  No  significant aortic insufficiency is noted.  Doppler interrogation of the  aortic valve is within normal limits.   The mitral valve also appears structurally normal.  Mitral valve prolapse is  noted.  Trivial mitral regurgitation is noted.  Doppler interrogation of the  mitral valve is within normal limits.   The pulmonic valve is not well visualized.   The tricuspid valve is also not well visualized but appears to be grossly  structurally normal.  Mild tricuspid regurgitation is noted.   The left ventricle measures normal in size with the LV IDD measured at 4.6  cm and the LV ISD measured at 3.5 cm.  Overall left ventricular systolic  function is low-normal to normal.  No regional wall motion abnormality is  noted.   The right atrium appears borderline generous, while the right ventricle is  moderately dilated with preserved right ventricular systolic function.   No significant pericardial effusion is noted.   IMPRESSION:  1. Trivial mitral and  mild tricuspid regurgitation.  2. Normal left ventricular size with left systolic function low-normal to      normal.  No regional wall motion abnormalities are noted.  3. Moderate right ventricular enlargement with preserved right ventricular      systolic function.  4. No obvious clots, masses or vegetations were noted but this study is      not adequate to assess for subtle findings.  Consider transesophageal      echocardiogram if clinically indicated.           ______________________________  Dani Gobble, MD     AB/MEDQ  D:  05/16/2006  T:  05/17/2006  Job:  [id number]   cc:   Ramon Dredge L. Juanetta Gosling, M.D.  Fax: [phone]

## 2011-01-28 NOTE — Discharge Summary (Signed)
NAME:  [name] [name], [name] [name]                         ACCOUNT NO.:  [id number]   MEDICAL RECORD NO.:  [id number]                   PATIENT TYPE:  INP   LOCATION:  3731                                 FACILITY:  [facility]   PHYSICIAN:  Corinna L. Lendell Caprice, MD             DATE OF BIRTH:  11-Jan-1961   DATE OF ADMISSION:  04/21/2003  DATE OF DISCHARGE:  04/23/2003                                 DISCHARGE SUMMARY   DIAGNOSES:  1. Chest pain, suspect bronchitis with bronchospasm versus gastritis or     esophagitis.  2. Tobacco abuse.   DISCHARGE MEDICATIONS:  1. Protonix 40 mg p.o. q.d.  2. Z-Pack.  3. Combivent two puffs q.4h. p.r.n. shortness of breath.   FOLLOW UP:  Follow up with Dr. Arvilla Market at 11:30 a.m. on August 30.   ACTIVITY:  Ad-lib.   DIET:  Regular diet.   He is encouraged to quit smoking.   CONDITION ON DISCHARGE:  Stable.   PERTINENT LABORATORY DATA:  His cardiac enzymes and troponins were negative  x3. Total cholesterol was 177, triglycerides 189, HDL 44, LDL 95. CBC was  normal.   SPECIAL STUDIES AND RADIOLOGY:  Chest x-ray negative. Stress Cardiolite  showed no ischemia or infarct. EF was 51%. EKGs showed normal sinus rhythm  with possible septal infarct which was done serially and remained  essentially unchanged. Spiral CT of the chest, wet reading, negative for  pulmonary embolus.   CONSULTANTS:  Dr. Fraser Din.   HISTORY AND HOSPITAL COURSE:  [name] [name] is a 50 year old white male  smoker who presented to the emergency room with complaints of chest pain and  shortness of breath. It was substernal. It was relieved by nitroglycerin. He  was admitted to telemetry where he ruled out for myocardial infarction. He  continued to have chest pain periodically, however. Again this was  alleviated by nitroglycerin. His physical examination admission was normal  as were his vital signs and oxygen saturations. He remained in normal sinus  rhythm. He was started on aspirin  and Lovenox and subsequently nitroglycerin  paste. This was stopped, however, due to an episode of asymptomatic  hypotension to a systolic of 80. At the time of discharge, he had no further  chest pain, but he was complaining of nonproductive cough. He had no  wheezing, no fevers, and no leukocytosis. He also had a negative  CT of the chest for pulmonary embolus. This is a wet reading, and the  official result is at this time pending. He is being discharged on a Z-Pack  with Combivent as needed for shortness of breath as well as Protonix. As he  continues to have this pain, he may warrant further GI workup.  Corinna L. Lendell Caprice, MD    CLS/MEDQ  D:  04/23/2003  T:  04/24/2003  Job:  [id number]   cc:   Meade Maw, M.D.  [address]  [place]  [place] [address]  Fax: [phone]   Donia Guiles, M.D.  [address] [place]  [place] [address]  Fax: [phone]

## 2011-03-07 ENCOUNTER — Emergency Department (HOSPITAL_COMMUNITY)
Admission: EM | Admit: 2011-03-07 | Discharge: 2011-03-07 | Disposition: A | Discharge status: Home / Self Care | Payer: Medicare Other | Attending: Emergency Medicine | Admitting: Emergency Medicine

## 2011-03-07 ENCOUNTER — Emergency Department (HOSPITAL_COMMUNITY): Payer: Medicare Other

## 2011-03-07 DIAGNOSIS — F17200 Nicotine dependence, unspecified, uncomplicated: Secondary | ICD-10-CM | POA: Insufficient documentation

## 2011-03-07 DIAGNOSIS — G40909 Epilepsy, unspecified, not intractable, without status epilepticus: Secondary | ICD-10-CM | POA: Insufficient documentation

## 2011-03-07 DIAGNOSIS — R071 Chest pain on breathing: Secondary | ICD-10-CM | POA: Insufficient documentation

## 2011-03-07 DIAGNOSIS — R112 Nausea with vomiting, unspecified: Secondary | ICD-10-CM | POA: Insufficient documentation

## 2011-03-07 DIAGNOSIS — J40 Bronchitis, not specified as acute or chronic: Secondary | ICD-10-CM | POA: Insufficient documentation

## 2011-03-07 DIAGNOSIS — M542 Cervicalgia: Secondary | ICD-10-CM | POA: Insufficient documentation

## 2011-03-07 DIAGNOSIS — J4489 Other specified chronic obstructive pulmonary disease: Secondary | ICD-10-CM | POA: Insufficient documentation

## 2011-03-07 DIAGNOSIS — J449 Chronic obstructive pulmonary disease, unspecified: Secondary | ICD-10-CM | POA: Insufficient documentation

## 2011-03-07 LAB — CBC
Hemoglobin: 18 g/dL — ABNORMAL HIGH (ref 13.0–17.0)
MCV: 88.9 fL (ref 78.0–100.0)

## 2011-03-07 LAB — DIFFERENTIAL
Basophils Relative: 1 % (ref 0–1)
Eosinophils Absolute: 0 10*3/uL (ref 0.0–0.7)
Eosinophils Relative: 0 % (ref 0–5)
Lymphs Abs: 1.1 10*3/uL (ref 0.7–4.0)
Monocytes Absolute: 0.6 10*3/uL (ref 0.1–1.0)
Monocytes Relative: 11 % (ref 3–12)
Neutrophils Relative %: 71 % (ref 43–77)

## 2011-03-07 LAB — TROPONIN I: Troponin I: <0.3 ng/mL (ref <0.30)

## 2011-03-07 LAB — BASIC METABOLIC PANEL
CO2: 24 mEq/L (ref 19–32)
Chloride: 101 mEq/L (ref 96–112)
Potassium: 4.2 mEq/L (ref 3.5–5.1)

## 2011-03-07 LAB — CK TOTAL AND CKMB (NOT AT ARMC)
CK, MB: 1.3 ng/mL (ref 0.3–4.0)
Relative Index: INVALID (ref 0.0–2.5)
Total CK: 95 U/L (ref 7–232)

## 2011-03-17 ENCOUNTER — Other Ambulatory Visit (HOSPITAL_COMMUNITY): Payer: Self-pay | Admitting: Family Medicine

## 2011-03-17 DIAGNOSIS — R29898 Other symptoms and signs involving the musculoskeletal system: Secondary | ICD-10-CM

## 2011-03-17 DIAGNOSIS — K2970 Gastritis, unspecified, without bleeding: Secondary | ICD-10-CM

## 2011-03-17 DIAGNOSIS — Z09 Encounter for follow-up examination after completed treatment for conditions other than malignant neoplasm: Secondary | ICD-10-CM

## 2011-03-17 DIAGNOSIS — K2990 Gastroduodenitis, unspecified, without bleeding: Secondary | ICD-10-CM

## 2011-03-17 DIAGNOSIS — M5090 Cervical disc disorder, unspecified, unspecified cervical region: Secondary | ICD-10-CM

## 2011-03-18 ENCOUNTER — Ambulatory Visit (HOSPITAL_COMMUNITY)
Admission: RE | Admit: 2011-03-18 | Discharge: 2011-03-18 | Disposition: A | Discharge status: Home / Self Care | Payer: Medicare Other | Source: Ambulatory Visit | Attending: Family Medicine | Admitting: Family Medicine

## 2011-03-18 DIAGNOSIS — M5090 Cervical disc disorder, unspecified, unspecified cervical region: Secondary | ICD-10-CM

## 2011-03-18 DIAGNOSIS — Z09 Encounter for follow-up examination after completed treatment for conditions other than malignant neoplasm: Secondary | ICD-10-CM

## 2011-03-18 DIAGNOSIS — M542 Cervicalgia: Secondary | ICD-10-CM | POA: Insufficient documentation

## 2011-03-18 DIAGNOSIS — R29898 Other symptoms and signs involving the musculoskeletal system: Secondary | ICD-10-CM

## 2011-03-18 DIAGNOSIS — Z981 Arthrodesis status: Secondary | ICD-10-CM | POA: Insufficient documentation

## 2011-05-19 ENCOUNTER — Other Ambulatory Visit: Payer: Self-pay | Admitting: Neurosurgery

## 2011-05-19 DIAGNOSIS — M47812 Spondylosis without myelopathy or radiculopathy, cervical region: Secondary | ICD-10-CM

## 2011-05-20 ENCOUNTER — Ambulatory Visit
Admission: RE | Admit: 2011-05-20 | Discharge: 2011-05-20 | Disposition: A | Discharge status: Home / Self Care | Payer: Medicare Other | Source: Ambulatory Visit | Attending: Neurosurgery | Admitting: Neurosurgery

## 2011-05-20 ENCOUNTER — Other Ambulatory Visit: Payer: Self-pay | Admitting: Neurosurgery

## 2011-05-20 DIAGNOSIS — M47812 Spondylosis without myelopathy or radiculopathy, cervical region: Secondary | ICD-10-CM

## 2011-05-20 DIAGNOSIS — M47816 Spondylosis without myelopathy or radiculopathy, lumbar region: Secondary | ICD-10-CM

## 2011-05-20 DIAGNOSIS — M542 Cervicalgia: Secondary | ICD-10-CM

## 2011-05-20 MED ORDER — IOHEXOL 300 MG/ML  SOLN
1.0000 mL | Freq: Once | INTRAMUSCULAR | Status: AC | PRN
  Administered 2011-05-20: 1 mL via EPIDURAL

## 2011-05-20 MED ORDER — TRIAMCINOLONE ACETONIDE 40 MG/ML IJ SUSP (RADIOLOGY)
60.0000 mg | Freq: Once | INTRAMUSCULAR | Status: AC
  Administered 2011-05-20: 60 mg via EPIDURAL

## 2011-05-27 ENCOUNTER — Ambulatory Visit
Admission: RE | Admit: 2011-05-27 | Discharge: 2011-05-27 | Disposition: A | Discharge status: Home / Self Care | Payer: Medicare Other | Source: Ambulatory Visit | Attending: Neurosurgery | Admitting: Neurosurgery

## 2011-05-27 DIAGNOSIS — M47816 Spondylosis without myelopathy or radiculopathy, lumbar region: Secondary | ICD-10-CM

## 2011-06-27 ENCOUNTER — Other Ambulatory Visit: Payer: Self-pay | Admitting: Neurosurgery

## 2011-06-27 DIAGNOSIS — M47816 Spondylosis without myelopathy or radiculopathy, lumbar region: Secondary | ICD-10-CM

## 2011-07-11 ENCOUNTER — Other Ambulatory Visit: Payer: Medicare Other

## 2011-07-11 ENCOUNTER — Ambulatory Visit
Admission: RE | Admit: 2011-07-11 | Discharge: 2011-07-11 | Disposition: A | Discharge status: Home / Self Care | Payer: Medicare Other | Source: Ambulatory Visit | Attending: Neurosurgery | Admitting: Neurosurgery

## 2011-07-11 DIAGNOSIS — M47816 Spondylosis without myelopathy or radiculopathy, lumbar region: Secondary | ICD-10-CM

## 2011-07-11 MED ORDER — METHYLPREDNISOLONE ACETATE 40 MG/ML INJ SUSP (RADIOLOG
120.0000 mg | Freq: Once | INTRAMUSCULAR | Status: AC
  Administered 2011-07-11: 120 mg via EPIDURAL

## 2011-07-11 MED ORDER — IOHEXOL 180 MG/ML  SOLN
1.0000 mL | Freq: Once | INTRAMUSCULAR | Status: AC | PRN
  Administered 2011-07-11: 1 mL via EPIDURAL

## 2011-07-22 ENCOUNTER — Encounter: Payer: Self-pay | Admitting: Internal Medicine

## 2011-07-25 ENCOUNTER — Ambulatory Visit (INDEPENDENT_AMBULATORY_CARE_PROVIDER_SITE_OTHER): Payer: Medicare Other | Admitting: Gastroenterology

## 2011-07-25 ENCOUNTER — Encounter: Payer: Self-pay | Admitting: Gastroenterology

## 2011-07-25 VITALS — BP 131/78 | HR 75 | Temp 97.6°F | Ht 66.0 in | Wt 177.4 lb

## 2011-07-25 DIAGNOSIS — Z1211 Encounter for screening for malignant neoplasm of colon: Secondary | ICD-10-CM | POA: Insufficient documentation

## 2011-07-25 DIAGNOSIS — Z139 Encounter for screening, unspecified: Secondary | ICD-10-CM

## 2011-07-25 NOTE — Progress Notes (Signed)
Cc to PCP 

## 2011-07-25 NOTE — Patient Instructions (Signed)
We have set you up for a colonoscopy with Dr. Jena Gauss in the near future.  Further recommendations to follow when this is completed.

## 2011-07-25 NOTE — Progress Notes (Addendum)
Primary Care Physician:  MCGOUGH,WILLIAM M, MD Primary Gastroenterologist:  Dr. Rourk   Chief Complaint  Patient presents with  . Colonoscopy    HPI:   [name] is a 50-year-old male who presents today for a visit prior to initial screening colonoscopy. He denies any abdominal pain, melena, hematochezia, constipation, diarrhea. He denies any weight loss or lack of appetite. He is completely devoid of any lower or upper GI symptoms.  As of note, he is on several narcotics for chronic back pain. However, he is adamantly refusing proceeding in the OR. He states he will do "just fine" with standard sedation. I discussed with him the reasons we proceed with Propofol in certain cases, and he still wants to "try" standard sedation. I did inform him that there is a possibility his procedure could be rescheduled if sedation was not adequate. He thoroughly understands this.   Past Medical History  Diagnosis Date  . COPD (chronic obstructive pulmonary disease)   . DDD (degenerative disc disease)   . DM (diabetes mellitus)     Type II  . HTN (hypertension)   . GERD (gastroesophageal reflux disease)     Past Surgical History  Procedure Date  . Cholecystectomy   . Cervical disc surgery     X4  . Elbow surgery     right    Current Outpatient Prescriptions  Medication Sig Dispense Refill  . alprazolam (XANAX) 2 MG tablet Take 2 mg by mouth at bedtime as needed.        . butalbital-aspirin-caffeine (BUTALBITAL COMPOUND/ASA) 50-325-40 MG per tablet Take 1 tablet by mouth 2 (two) times daily as needed.        . carbamazepine (TEGRETOL) 200 MG tablet Take 200 mg by mouth 3 (three) times daily.        . lisinopril (PRINIVIL,ZESTRIL) 20 MG tablet Take 20 mg by mouth daily.        . naproxen (NAPROSYN) 500 MG tablet Take 500 mg by mouth 2 (two) times daily with a meal.        . ondansetron (ZOFRAN) 4 MG tablet Take 4 mg by mouth every 8 (eight) hours as needed.        . oxyCODONE (OXYCONTIN) 15  MG TB12 Take 15 mg by mouth every 12 (twelve) hours.        . oxyCODONE (OXYCONTIN) 40 MG 12 hr tablet Take 40 mg by mouth every 12 (twelve) hours.        . pantoprazole (PROTONIX) 40 MG tablet Take 40 mg by mouth daily.        . predniSONE (DELTASONE) 5 MG tablet Take 5 mg by mouth daily.          Allergies as of 07/25/2011  . (No Known Allergies)    Family History  Problem Relation Age of Onset  . Colon cancer Neg Hx     History   Social History  . Marital Status: Divorced    Spouse Name: N/A    Number of Children: N/A  . Years of Education: N/A   Occupational History  . disability    Social History Main Topics  . Smoking status: Current Some Day Smoker    Types: Cigarettes  . Smokeless tobacco: Not on file   Comment: 1-3 cigarettes   . Alcohol Use: No  . Drug Use: No  . Sexually Active: Not on file   Other Topics Concern  . Not on file   Social History Narrative  . No   narrative on file    Review of Systems: Gen: Denies any fever, chills, fatigue, weight loss, lack of appetite.  CV: Denies chest pain, heart palpitations, peripheral edema, syncope.  Resp: Denies shortness of breath at rest or with exertion. Denies wheezing or cough.  GI: Denies dysphagia or odynophagia. Denies jaundice, hematemesis, fecal incontinence. GU : Denies urinary burning, urinary frequency, urinary hesitancy MS: Denies joint pain, muscle weakness, cramps, or limitation of movement.  Derm: Denies rash, itching, dry skin Psych: Denies depression, anxiety, memory loss, and confusion Heme: Denies bruising, bleeding, and enlarged lymph nodes.  Physical Exam: BP 131/78  Pulse 75  Temp(Src) 97.6 F (36.4 C) (Temporal)  Ht 5' 6" (1.676 m)  Wt 177 lb 6.4 oz (80.468 kg)  BMI 28.63 kg/m2 General:   Alert and oriented. Pleasant and cooperative. Well-nourished and well-developed.  Head:  Normocephalic and atraumatic. Eyes:  Without icterus, sclera clear and conjunctiva pink.  Ears:  Normal  auditory acuity. Nose:  No deformity, discharge,  or lesions. Mouth:  No deformity or lesions, oral mucosa pink.  Neck:  Supple, without mass or thyromegaly. Lungs:  Clear to auscultation bilaterally. No wheezes, rales, or rhonchi. No distress.  Heart:  S1, S2 present without murmurs appreciated.  Abdomen:  +BS, soft, non-tender and non-distended. No HSM noted. No guarding or rebound. No masses appreciated.  Rectal:  Deferred  Msk:  Symmetrical without gross deformities. Normal posture. Extremities:  Without clubbing or edema. Neurologic:  Alert and  oriented x4;  grossly normal neurologically. Skin:  Intact without significant lesions or rashes. Cervical Nodes:  No significant cervical adenopathy. Psych:  Alert and cooperative. Normal mood and affect.    

## 2011-07-25 NOTE — Assessment & Plan Note (Signed)
50 year old male with need for initial screening colonoscopy. He has no lower or upper GI symptoms at this time. He denies any melena or hematochezia, change in bowel habits. He is on chronic narcotics for back pain; however, he is refusing to proceed with the case in the OR with Propofol. The reason for recommending this avenue of sedation was discussed in detail with him; I also informed him of the possibility of having to postpone the case if he does not tolerate standard sedation. He is continuing to refuse Propofol.   We will proceed with standard sedation. Proceed with TCS with Dr. Jena Gauss in near future: the risks, benefits, and alternatives have been discussed with the patient in detail. The patient states understanding and desires to proceed.

## 2011-07-25 NOTE — Consult Note (Signed)
Primary Care Physician:  Kirk Ruths, MD Primary Gastroenterologist:  Dr. Jena Gauss   Chief Complaint  Patient presents with  . Colonoscopy    HPI:   [name] [name] is a 50 year old male who presents today for a visit prior to initial screening colonoscopy. He denies any abdominal pain, melena, hematochezia, constipation, diarrhea. He denies any weight loss or lack of appetite. He is completely devoid of any lower or upper GI symptoms.  As of note, he is on several narcotics for chronic back pain. However, he is adamantly refusing proceeding in the OR. He states he will do "just fine" with standard sedation. I discussed with him the reasons we proceed with Propofol in certain cases, and he still wants to "try" standard sedation. I did inform him that there is a possibility his procedure could be rescheduled if sedation was not adequate. He thoroughly understands this.   Past Medical History  Diagnosis Date  . COPD (chronic obstructive pulmonary disease)   . DDD (degenerative disc disease)   . DM (diabetes mellitus)     Type II  . HTN (hypertension)   . GERD (gastroesophageal reflux disease)     Past Surgical History  Procedure Date  . Cholecystectomy   . Cervical disc surgery     X4  . Elbow surgery     right    Current Outpatient Prescriptions  Medication Sig Dispense Refill  . alprazolam (XANAX) 2 MG tablet Take 2 mg by mouth at bedtime as needed.        . butalbital-aspirin-caffeine (BUTALBITAL COMPOUND/ASA) 50-325-40 MG per tablet Take 1 tablet by mouth 2 (two) times daily as needed.        . carbamazepine (TEGRETOL) 200 MG tablet Take 200 mg by mouth 3 (three) times daily.        Marland Kitchen lisinopril (PRINIVIL,ZESTRIL) 20 MG tablet Take 20 mg by mouth daily.        . naproxen (NAPROSYN) 500 MG tablet Take 500 mg by mouth 2 (two) times daily with a meal.        . ondansetron (ZOFRAN) 4 MG tablet Take 4 mg by mouth every 8 (eight) hours as needed.        Marland Kitchen oxyCODONE (OXYCONTIN) 15  MG TB12 Take 15 mg by mouth every 12 (twelve) hours.        Marland Kitchen oxyCODONE (OXYCONTIN) 40 MG 12 hr tablet Take 40 mg by mouth every 12 (twelve) hours.        . pantoprazole (PROTONIX) 40 MG tablet Take 40 mg by mouth daily.        . predniSONE (DELTASONE) 5 MG tablet Take 5 mg by mouth daily.          Allergies as of 07/25/2011  . (No Known Allergies)    Family History  Problem Relation Age of Onset  . Colon cancer Neg Hx     History   Social History  . Marital Status: Divorced    Spouse Name: N/A    Number of Children: N/A  . Years of Education: N/A   Occupational History  . disability    Social History Main Topics  . Smoking status: Current Some Day Smoker    Types: Cigarettes  . Smokeless tobacco: Not on file   Comment: 1-3 cigarettes   . Alcohol Use: No  . Drug Use: No  . Sexually Active: Not on file   Other Topics Concern  . Not on file   Social History Narrative  . No  narrative on file    Review of Systems: Gen: Denies any fever, chills, fatigue, weight loss, lack of appetite.  CV: Denies chest pain, heart palpitations, peripheral edema, syncope.  Resp: Denies shortness of breath at rest or with exertion. Denies wheezing or cough.  GI: Denies dysphagia or odynophagia. Denies jaundice, hematemesis, fecal incontinence. GU : Denies urinary burning, urinary frequency, urinary hesitancy MS: Denies joint pain, muscle weakness, cramps, or limitation of movement.  Derm: Denies rash, itching, dry skin Psych: Denies depression, anxiety, memory loss, and confusion Heme: Denies bruising, bleeding, and enlarged lymph nodes.  Physical Exam: BP 131/78  Pulse 75  Temp(Src) 97.6 F (36.4 C) (Temporal)  Ht 5\' 6"  (1.676 m)  Wt 177 lb 6.4 oz (80.468 kg)  BMI 28.63 kg/m2 General:   Alert and oriented. Pleasant and cooperative. Well-nourished and well-developed.  Head:  Normocephalic and atraumatic. Eyes:  Without icterus, sclera clear and conjunctiva pink.  Ears:  Normal  auditory acuity. Nose:  No deformity, discharge,  or lesions. Mouth:  No deformity or lesions, oral mucosa pink.  Neck:  Supple, without mass or thyromegaly. Lungs:  Clear to auscultation bilaterally. No wheezes, rales, or rhonchi. No distress.  Heart:  S1, S2 present without murmurs appreciated.  Abdomen:  +BS, soft, non-tender and non-distended. No HSM noted. No guarding or rebound. No masses appreciated.  Rectal:  Deferred  Msk:  Symmetrical without gross deformities. Normal posture. Extremities:  Without clubbing or edema. Neurologic:  Alert and  oriented x4;  grossly normal neurologically. Skin:  Intact without significant lesions or rashes. Cervical Nodes:  No significant cervical adenopathy. Psych:  Alert and cooperative. Normal mood and affect.

## 2011-08-15 ENCOUNTER — Encounter (HOSPITAL_COMMUNITY): Payer: Self-pay | Admitting: Pharmacy Technician

## 2011-08-18 ENCOUNTER — Other Ambulatory Visit: Payer: Self-pay | Admitting: Neurosurgery

## 2011-08-18 DIAGNOSIS — M47812 Spondylosis without myelopathy or radiculopathy, cervical region: Secondary | ICD-10-CM

## 2011-08-19 ENCOUNTER — Ambulatory Visit
Admission: RE | Admit: 2011-08-19 | Discharge: 2011-08-19 | Disposition: A | Discharge status: Home / Self Care | Payer: Medicare Other | Source: Ambulatory Visit | Attending: Neurosurgery | Admitting: Neurosurgery

## 2011-08-19 DIAGNOSIS — M47812 Spondylosis without myelopathy or radiculopathy, cervical region: Secondary | ICD-10-CM

## 2011-08-19 MED ORDER — IOHEXOL 300 MG/ML  SOLN
1.0000 mL | Freq: Once | INTRAMUSCULAR | Status: AC | PRN
  Administered 2011-08-19: 1 mL via EPIDURAL

## 2011-08-19 MED ORDER — TRIAMCINOLONE ACETONIDE 40 MG/ML IJ SUSP (RADIOLOGY)
60.0000 mg | Freq: Once | INTRAMUSCULAR | Status: AC
  Administered 2011-08-19: 60 mg via EPIDURAL

## 2011-08-23 MED ORDER — SODIUM CHLORIDE 0.45 % IV SOLN
Freq: Once | INTRAVENOUS | Status: AC
  Administered 2011-08-24: 07:00:00 via INTRAVENOUS

## 2011-08-24 ENCOUNTER — Other Ambulatory Visit: Payer: Self-pay | Admitting: Internal Medicine

## 2011-08-24 ENCOUNTER — Ambulatory Visit (HOSPITAL_COMMUNITY)
Admission: RE | Admit: 2011-08-24 | Discharge: 2011-08-24 | Disposition: A | Discharge status: Home / Self Care | Payer: Medicare Other | Source: Ambulatory Visit | Attending: Internal Medicine | Admitting: Internal Medicine

## 2011-08-24 ENCOUNTER — Encounter (HOSPITAL_COMMUNITY)
Admission: RE | Disposition: A | Discharge status: Home / Self Care | Payer: Self-pay | Source: Ambulatory Visit | Attending: Internal Medicine

## 2011-08-24 ENCOUNTER — Encounter (HOSPITAL_COMMUNITY): Payer: Self-pay

## 2011-08-24 DIAGNOSIS — D126 Benign neoplasm of colon, unspecified: Secondary | ICD-10-CM | POA: Insufficient documentation

## 2011-08-24 DIAGNOSIS — Z139 Encounter for screening, unspecified: Secondary | ICD-10-CM

## 2011-08-24 DIAGNOSIS — J449 Chronic obstructive pulmonary disease, unspecified: Secondary | ICD-10-CM | POA: Insufficient documentation

## 2011-08-24 DIAGNOSIS — J4489 Other specified chronic obstructive pulmonary disease: Secondary | ICD-10-CM | POA: Insufficient documentation

## 2011-08-24 DIAGNOSIS — Z1211 Encounter for screening for malignant neoplasm of colon: Secondary | ICD-10-CM

## 2011-08-24 DIAGNOSIS — Z79899 Other long term (current) drug therapy: Secondary | ICD-10-CM | POA: Insufficient documentation

## 2011-08-24 DIAGNOSIS — K5730 Diverticulosis of large intestine without perforation or abscess without bleeding: Secondary | ICD-10-CM

## 2011-08-24 DIAGNOSIS — I10 Essential (primary) hypertension: Secondary | ICD-10-CM | POA: Insufficient documentation

## 2011-08-24 DIAGNOSIS — E119 Type 2 diabetes mellitus without complications: Secondary | ICD-10-CM | POA: Insufficient documentation

## 2011-08-24 HISTORY — DX: Anxiety disorder, unspecified: F41.9

## 2011-08-24 HISTORY — DX: Unspecified convulsions: R56.9

## 2011-08-24 HISTORY — PX: COLONOSCOPY: SHX5424

## 2011-08-24 SURGERY — COLONOSCOPY
Anesthesia: Moderate Sedation

## 2011-08-24 MED ORDER — MEPERIDINE HCL 100 MG/ML IJ SOLN
INTRAMUSCULAR | Status: AC
  Filled 2011-08-24: qty 2

## 2011-08-24 MED ORDER — MIDAZOLAM HCL 5 MG/5ML IJ SOLN
INTRAMUSCULAR | Status: AC
  Filled 2011-08-24: qty 10

## 2011-08-24 MED ORDER — SIMETHICONE 40 MG/0.6ML PO SUSP
ORAL | Status: DC | PRN
  Administered 2011-08-24: 07:00:00

## 2011-08-24 NOTE — H&P (Signed)
  I have seen & examined the patient prior to the procedure(s) today and reviewed the history and physical/consultation.  There have been no changes.  After consideration of the risks, benefits, alternatives and imponderables, the patient has consented to the procedure(s).   

## 2011-08-24 NOTE — Op Note (Signed)
[facility] [address] [place], [place]  [address]  COLONOSCOPY PROCEDURE REPORT  PATIENT:  Graves, Nipp  MR#:  [id number] BIRTHDATE:  08/01/1961, 50 yrs. old  GENDER:  male ENDOSCOPIST:  R. Roetta Sessions, MD FACP [facility] REF. BY:           Dr. Maretta Los B. PROCEDURE DATE:  08/24/2011 PROCEDURE:   Colonoscopy  with biopsy  INDICATIONS:   First-ever average risk screening colonoscopy. the patient wishes to have this done without any sedation  INFORMED CONSENT:  The risks, benefits, alternatives and imponderables including but not limited to bleeding, perforation as well as the possibility of a missed lesion have been reviewed. The potential for biopsy, lesion removal, etc. have also been discussed.  Questions have been answered.  All parties agreeable. Please see the history and physical in the medical record for more information.  MEDICATIONS:   none  DESCRIPTION OF PROCEDURE:  After a digital rectal exam was performed, the EC-[id number] ([id number]) colonoscope was advanced from the anus through the rectum and colon to the area of the cecum, ileocecal valve and appendiceal orifice.  The cecum was deeply intubated.  These structures were well-seen and photographed for the record.  From the level of the cecum and ileocecal valve, the scope was slowly and cautiously withdrawn.  The mucosal surfaces were carefully surveyed utilizing scope tip deflection to facilitate fold flattening as needed.  The scope was pulled down into the rectum where a thorough examination including retroflexion was performed. <<PROCEDUREIMAGES>>  FINDINGS:   Adequate preparation. anal papilla; otherwise normal rectum. Sigmoid diverticula. 4 mm area of pale mucosa with an erythematous border in the mid descending colon (see image 5). remainder of the colonic mucosa appeared normal  THERAPEUTIC / DIAGNOSTIC MANEUVERS PERFORMED: abnormal area of colonic mucosa described above  COMPLICATIONS:   none.    Patient tolerated the procedure very well without any conscious sedation whatsoever.  CECAL WITHDRAWAL TIME: 10 minutes  IMPRESSION:    Colonic diverticulosis. Small focal lesion in the mid descending colon of uncertain significance - Status post biopsy  RECOMMENDATIONS:   Diverticulosis information provided. Follow up on pathology.  ______________________________ R. Roetta Sessions, MD Caleen Essex  CC:  Karleen Hampshire, MD  n. eSIGNED:   R. Roetta Sessions at 08/24/2011 08:07 AM  Alarie, Kathlene November, [phone]

## 2011-09-01 ENCOUNTER — Telehealth: Payer: Self-pay | Admitting: Gastroenterology

## 2011-09-01 NOTE — Telephone Encounter (Signed)
Spoke with pt- he said that RMR only took a bx of the polyp and didn't remove the entire polyp. Pt doesn't want to wait 5 years to have it removed and wants to know if RMR can go back in now and remove the whole polyp. Pt is concerned that it will turn into cancer before he is to come back in 5 years.   Informed pt that I would let RMR know and would call him back next week. Pt ok to wait for response until then.

## 2011-09-01 NOTE — Telephone Encounter (Signed)
Pt called with questions regarding the letter he received about his bx results- please call him @ [phone] or [phone]

## 2011-09-07 NOTE — Telephone Encounter (Signed)
I called patient back. I explained in this gentleman this polyp was tiny and was excised with the cold biopsy technique. He doesn't need a colonoscopy for 5 years. He understands.                                                                              I spoke to patient about biopsies. This was a very small polyp. It was excised completely with cold biopsy forcep technique. No need to do anything else other than to have the patient come back in 5 years for repeat colonoscopy

## 2011-09-08 ENCOUNTER — Encounter (HOSPITAL_COMMUNITY): Payer: Self-pay | Admitting: Internal Medicine

## 2011-09-16 DIAGNOSIS — Z6827 Body mass index (BMI) 27.0-27.9, adult: Secondary | ICD-10-CM | POA: Diagnosis not present

## 2011-09-16 DIAGNOSIS — G8929 Other chronic pain: Secondary | ICD-10-CM | POA: Diagnosis not present

## 2011-09-16 DIAGNOSIS — E119 Type 2 diabetes mellitus without complications: Secondary | ICD-10-CM | POA: Diagnosis not present

## 2011-10-27 DIAGNOSIS — M47812 Spondylosis without myelopathy or radiculopathy, cervical region: Secondary | ICD-10-CM | POA: Diagnosis not present

## 2011-10-27 DIAGNOSIS — M47817 Spondylosis without myelopathy or radiculopathy, lumbosacral region: Secondary | ICD-10-CM | POA: Diagnosis not present

## 2011-12-13 DIAGNOSIS — Z6828 Body mass index (BMI) 28.0-28.9, adult: Secondary | ICD-10-CM | POA: Diagnosis not present

## 2011-12-13 DIAGNOSIS — G8929 Other chronic pain: Secondary | ICD-10-CM | POA: Diagnosis not present

## 2011-12-13 DIAGNOSIS — F411 Generalized anxiety disorder: Secondary | ICD-10-CM | POA: Diagnosis not present

## 2011-12-13 DIAGNOSIS — E119 Type 2 diabetes mellitus without complications: Secondary | ICD-10-CM | POA: Diagnosis not present

## 2011-12-20 DIAGNOSIS — E119 Type 2 diabetes mellitus without complications: Secondary | ICD-10-CM | POA: Diagnosis not present

## 2011-12-28 DIAGNOSIS — H2589 Other age-related cataract: Secondary | ICD-10-CM | POA: Diagnosis not present

## 2011-12-28 DIAGNOSIS — H2510 Age-related nuclear cataract, unspecified eye: Secondary | ICD-10-CM | POA: Diagnosis not present

## 2012-02-02 DIAGNOSIS — M47817 Spondylosis without myelopathy or radiculopathy, lumbosacral region: Secondary | ICD-10-CM | POA: Diagnosis not present

## 2012-02-02 DIAGNOSIS — M47812 Spondylosis without myelopathy or radiculopathy, cervical region: Secondary | ICD-10-CM | POA: Diagnosis not present

## 2012-02-29 DIAGNOSIS — H5200 Hypermetropia, unspecified eye: Secondary | ICD-10-CM | POA: Diagnosis not present

## 2012-03-09 DIAGNOSIS — G8929 Other chronic pain: Secondary | ICD-10-CM | POA: Diagnosis not present

## 2012-03-09 DIAGNOSIS — I10 Essential (primary) hypertension: Secondary | ICD-10-CM | POA: Diagnosis not present

## 2012-03-09 DIAGNOSIS — Z6828 Body mass index (BMI) 28.0-28.9, adult: Secondary | ICD-10-CM | POA: Diagnosis not present

## 2012-05-10 ENCOUNTER — Inpatient Hospital Stay (HOSPITAL_COMMUNITY)
Admission: EM | Admit: 2012-05-10 | Discharge: 2012-05-12 | DRG: 069 | Disposition: A | Discharge status: Home / Self Care | Payer: Medicare Other | Attending: Internal Medicine | Admitting: Internal Medicine

## 2012-05-10 ENCOUNTER — Emergency Department (HOSPITAL_COMMUNITY): Payer: Medicare Other

## 2012-05-10 ENCOUNTER — Encounter (HOSPITAL_COMMUNITY): Payer: Self-pay | Admitting: Vascular Surgery

## 2012-05-10 DIAGNOSIS — IMO0002 Reserved for concepts with insufficient information to code with codable children: Secondary | ICD-10-CM

## 2012-05-10 DIAGNOSIS — I208 Other forms of angina pectoris: Secondary | ICD-10-CM

## 2012-05-10 DIAGNOSIS — I209 Angina pectoris, unspecified: Secondary | ICD-10-CM | POA: Diagnosis not present

## 2012-05-10 DIAGNOSIS — G459 Transient cerebral ischemic attack, unspecified: Secondary | ICD-10-CM | POA: Diagnosis not present

## 2012-05-10 DIAGNOSIS — E119 Type 2 diabetes mellitus without complications: Secondary | ICD-10-CM | POA: Diagnosis not present

## 2012-05-10 DIAGNOSIS — F411 Generalized anxiety disorder: Secondary | ICD-10-CM | POA: Diagnosis present

## 2012-05-10 DIAGNOSIS — R079 Chest pain, unspecified: Secondary | ICD-10-CM

## 2012-05-10 DIAGNOSIS — Z87891 Personal history of nicotine dependence: Secondary | ICD-10-CM

## 2012-05-10 DIAGNOSIS — J449 Chronic obstructive pulmonary disease, unspecified: Secondary | ICD-10-CM | POA: Diagnosis present

## 2012-05-10 DIAGNOSIS — Z8249 Family history of ischemic heart disease and other diseases of the circulatory system: Secondary | ICD-10-CM

## 2012-05-10 DIAGNOSIS — I10 Essential (primary) hypertension: Secondary | ICD-10-CM | POA: Diagnosis not present

## 2012-05-10 DIAGNOSIS — R569 Unspecified convulsions: Secondary | ICD-10-CM | POA: Diagnosis present

## 2012-05-10 DIAGNOSIS — K219 Gastro-esophageal reflux disease without esophagitis: Secondary | ICD-10-CM | POA: Diagnosis present

## 2012-05-10 DIAGNOSIS — R0789 Other chest pain: Secondary | ICD-10-CM | POA: Diagnosis present

## 2012-05-10 DIAGNOSIS — J4489 Other specified chronic obstructive pulmonary disease: Secondary | ICD-10-CM | POA: Diagnosis present

## 2012-05-10 DIAGNOSIS — M5030 Other cervical disc degeneration, unspecified cervical region: Secondary | ICD-10-CM | POA: Diagnosis present

## 2012-05-10 DIAGNOSIS — R0602 Shortness of breath: Secondary | ICD-10-CM | POA: Diagnosis not present

## 2012-05-10 DIAGNOSIS — G8929 Other chronic pain: Secondary | ICD-10-CM

## 2012-05-10 DIAGNOSIS — Z79899 Other long term (current) drug therapy: Secondary | ICD-10-CM

## 2012-05-10 LAB — CBC WITH DIFFERENTIAL/PLATELET
Basophils Relative: 1 % (ref 0–1)
Hemoglobin: 16.9 g/dL (ref 13.0–17.0)
Lymphs Abs: 2 10*3/uL (ref 0.7–4.0)
MCHC: 35.1 g/dL (ref 30.0–36.0)
Monocytes Relative: 6 % (ref 3–12)
Neutro Abs: 4.4 10*3/uL (ref 1.7–7.7)
Neutrophils Relative %: 63 % (ref 43–77)
RBC: 5.4 MIL/uL (ref 4.22–5.81)
WBC: 7 10*3/uL (ref 4.0–10.5)

## 2012-05-10 LAB — TROPONIN I
Troponin I: <0.3 ng/mL (ref <0.30)
Troponin I: <0.3 ng/mL (ref <0.30)

## 2012-05-10 LAB — BASIC METABOLIC PANEL
BUN: 15 mg/dL (ref 6–23)
Chloride: 104 mEq/L (ref 96–112)
GFR calc Af Amer: >90 mL/min (ref >90)
Potassium: 4.1 mEq/L (ref 3.5–5.1)

## 2012-05-10 LAB — GLUCOSE, CAPILLARY: Glucose-Capillary: 114 mg/dL — ABNORMAL HIGH (ref 70–99)

## 2012-05-10 MED ORDER — OXYCODONE HCL 5 MG PO TABS
15.0000 mg | ORAL_TABLET | Freq: Four times a day (QID) | ORAL | Status: DC | PRN
  Administered 2012-05-11: 15 mg via ORAL
  Filled 2012-05-10: qty 3

## 2012-05-10 MED ORDER — LISINOPRIL 10 MG PO TABS
20.0000 mg | ORAL_TABLET | Freq: Two times a day (BID) | ORAL | Status: DC
  Administered 2012-05-11 – 2012-05-12 (×2): 20 mg via ORAL
  Filled 2012-05-10 (×3): qty 2

## 2012-05-10 MED ORDER — ALPRAZOLAM 1 MG PO TABS
2.0000 mg | ORAL_TABLET | Freq: Every evening | ORAL | Status: DC | PRN
  Filled 2012-05-10: qty 1
  Filled 2012-05-10: qty 2

## 2012-05-10 MED ORDER — ALBUTEROL SULFATE HFA 108 (90 BASE) MCG/ACT IN AERS
2.0000 | INHALATION_SPRAY | Freq: Four times a day (QID) | RESPIRATORY_TRACT | Status: DC | PRN
Start: 2012-05-10 — End: 2012-05-12

## 2012-05-10 MED ORDER — NAPROXEN 250 MG PO TABS
500.0000 mg | ORAL_TABLET | Freq: Two times a day (BID) | ORAL | Status: DC
  Administered 2012-05-10 – 2012-05-12 (×3): 500 mg via ORAL
  Filled 2012-05-10 (×4): qty 2

## 2012-05-10 MED ORDER — ACETAMINOPHEN 325 MG PO TABS
650.0000 mg | ORAL_TABLET | ORAL | Status: DC | PRN
  Administered 2012-05-10: 650 mg via ORAL
  Filled 2012-05-10: qty 2

## 2012-05-10 MED ORDER — SIMVASTATIN 20 MG PO TABS
20.0000 mg | ORAL_TABLET | Freq: Every day | ORAL | Status: DC
  Administered 2012-05-10 – 2012-05-11 (×2): 20 mg via ORAL
  Filled 2012-05-10 (×2): qty 1

## 2012-05-10 MED ORDER — SODIUM CHLORIDE 0.9 % IJ SOLN: 3.0000 mL | INTRAMUSCULAR | Status: DC | PRN

## 2012-05-10 MED ORDER — ASPIRIN 81 MG PO CHEW
324.0000 mg | CHEWABLE_TABLET | Freq: Once | ORAL | Status: AC
  Administered 2012-05-10: 324 mg via ORAL
  Filled 2012-05-10: qty 4

## 2012-05-10 MED ORDER — ONDANSETRON HCL 4 MG/2ML IJ SOLN: 4.0000 mg | Freq: Three times a day (TID) | INTRAMUSCULAR | Status: DC | PRN

## 2012-05-10 MED ORDER — METOPROLOL TARTRATE 25 MG PO TABS
12.5000 mg | ORAL_TABLET | Freq: Two times a day (BID) | ORAL | Status: DC
  Administered 2012-05-10 (×2): 12.5 mg via ORAL
  Filled 2012-05-10 (×2): qty 1

## 2012-05-10 MED ORDER — SODIUM CHLORIDE 0.9 % IV SOLN: INTRAVENOUS | Status: DC

## 2012-05-10 MED ORDER — OXYCODONE HCL 20 MG PO TB12: 40.0000 mg | ORAL_TABLET | Freq: Two times a day (BID) | ORAL | Status: DC

## 2012-05-10 MED ORDER — MORPHINE SULFATE 2 MG/ML IJ SOLN: 1.0000 mg | INTRAMUSCULAR | Status: DC | PRN

## 2012-05-10 MED ORDER — SODIUM CHLORIDE 0.9 % IV SOLN: 250.0000 mL | INTRAVENOUS | Status: DC | PRN

## 2012-05-10 MED ORDER — NITROGLYCERIN 0.4 MG SL SUBL: 0.4000 mg | SUBLINGUAL_TABLET | SUBLINGUAL | Status: DC | PRN

## 2012-05-10 MED ORDER — OXYCODONE HCL 15 MG PO TB12
15.0000 mg | ORAL_TABLET | Freq: Two times a day (BID) | ORAL | Status: DC
  Administered 2012-05-10 (×2): 15 mg via ORAL
  Filled 2012-05-10 (×2): qty 1

## 2012-05-10 MED ORDER — OXYCODONE HCL 20 MG PO TB12
40.0000 mg | ORAL_TABLET | Freq: Two times a day (BID) | ORAL | Status: DC
  Filled 2012-05-10 (×2): qty 2

## 2012-05-10 MED ORDER — INSULIN ASPART 100 UNIT/ML ~~LOC~~ SOLN
0.0000 [IU] | Freq: Three times a day (TID) | SUBCUTANEOUS | Status: DC
  Administered 2012-05-10: 3 [IU] via SUBCUTANEOUS

## 2012-05-10 MED ORDER — LISINOPRIL 10 MG PO TABS
20.0000 mg | ORAL_TABLET | Freq: Every day | ORAL | Status: DC
  Administered 2012-05-10: 20 mg via ORAL
  Filled 2012-05-10: qty 2

## 2012-05-10 MED ORDER — ASPIRIN EC 81 MG PO TBEC
81.0000 mg | DELAYED_RELEASE_TABLET | Freq: Every day | ORAL | Status: DC
  Administered 2012-05-11 – 2012-05-12 (×2): 81 mg via ORAL
  Filled 2012-05-10 (×2): qty 1

## 2012-05-10 MED ORDER — CARBAMAZEPINE 200 MG PO TABS
200.0000 mg | ORAL_TABLET | Freq: Two times a day (BID) | ORAL | Status: DC
  Administered 2012-05-10 – 2012-05-12 (×5): 200 mg via ORAL
  Filled 2012-05-10 (×6): qty 1

## 2012-05-10 MED ORDER — ENOXAPARIN SODIUM 40 MG/0.4ML ~~LOC~~ SOLN
40.0000 mg | SUBCUTANEOUS | Status: DC
  Administered 2012-05-10 – 2012-05-11 (×2): 40 mg via SUBCUTANEOUS
  Filled 2012-05-10 (×2): qty 0.4

## 2012-05-10 MED ORDER — ONDANSETRON HCL 4 MG/2ML IJ SOLN
4.0000 mg | Freq: Four times a day (QID) | INTRAMUSCULAR | Status: DC | PRN
  Administered 2012-05-11 (×2): 4 mg via INTRAVENOUS
  Filled 2012-05-10 (×2): qty 2

## 2012-05-10 MED ORDER — INSULIN ASPART 100 UNIT/ML ~~LOC~~ SOLN: 0.0000 [IU] | Freq: Every day | SUBCUTANEOUS | Status: DC

## 2012-05-10 MED ORDER — SODIUM CHLORIDE 0.9 % IJ SOLN
3.0000 mL | Freq: Two times a day (BID) | INTRAMUSCULAR | Status: DC
  Administered 2012-05-10 – 2012-05-11 (×4): 3 mL via INTRAVENOUS
  Filled 2012-05-10 (×3): qty 3

## 2012-05-10 NOTE — ED Provider Notes (Signed)
History   This chart was scribed for [name] Octave, MD by Charolett Bumpers . The patient was seen in room APA02/APA02. Patient's care was started at 1023.    CSN: [id number]  Arrival date & time 05/10/12  1001   First MD Initiated Contact with Patient 05/10/12 1023      Chief Complaint  Patient presents with  . Chest Pain    (Consider location/radiation/quality/duration/timing/severity/associated sxs/prior treatment) HPI [name] [name] is a 51 y.o. male who presents to the Emergency Department complaining of constant, moderate, chest pain described as heaviness with an onset of 2 days ago. Pt reports that 5 days ago, he had left-sided numbness/weakness in arm and leg and chest pain described as heaviness lasting an hour and half that radiated to arm. Pt reports that the chest pain returned 2 days ago in which he took a Nitroglycerin with some relief, but reports the chest pain has been constant since. Pt reports he was laying in bed with onset. Pt reports that he is still experiencing some currently left-sided weakness and chest pain. Pt also complains of a headache that has been constant for the past week. Pt denies any trouble talking or swallowing. Pt reports a h/o 4 neck surgeries and still has a herniated disc in neck. Pt denies any cardiac hx. Pt reports that his last stress test was in 2000. Pt denies any h/o MI, stents, CVA, hyperlipidemia. Pt reports a h/o HTN, diabetes, COPD. Pt is not a current smoker.   PCP: Dr. Regino Schultze Neurologist: Dr. Channing Mutters Past Medical History  Diagnosis Date  . COPD (chronic obstructive pulmonary disease)   . DDD (degenerative disc disease)   . DM (diabetes mellitus)     Type II  . HTN (hypertension)   . GERD (gastroesophageal reflux disease)   . Seizures     last seizure 7 yrs ago-unknown etiology  . Anxiety     Past Surgical History  Procedure Date  . Cholecystectomy   . Cervical disc surgery     X4  . Elbow surgery     right  .  Colonoscopy 08/24/2011    Procedure: COLONOSCOPY;  Surgeon: Corbin Ade, MD;  Location: [facility];  Service: Endoscopy;  Laterality: N/A;  7:30AM    Family History  Problem Relation Age of Onset  . Colon cancer Neg Hx   . Anesthesia problems Neg Hx   . Hypotension Neg Hx   . Malignant hyperthermia Neg Hx   . Pseudochol deficiency Neg Hx   . Hypertension Father   . Heart attack Father     History  Substance Use Topics  . Smoking status: Former Smoker -- 1.5 packs/day for 30 years    Types: Cigarettes    Quit date: 06/24/2011  . Smokeless tobacco: Not on file   Comment: 1-3 cigarettes   . Alcohol Use: No      Review of Systems A complete 10 system review of systems was obtained and all systems are negative except as noted in the HPI and PMH.   Allergies  Review of patient's allergies indicates no known allergies.  Home Medications   No current outpatient prescriptions on file.  BP 161/89  Pulse 69  Temp 98.1 F (36.7 C) (Oral)  Resp 24  Ht 5\' 8"  (1.727 m)  Wt 182 lb 9.6 oz (82.827 kg)  BMI 27.76 kg/m2  SpO2 95%  Physical Exam  Nursing note and vitals reviewed. Constitutional: He is oriented to person, place, and time.  He appears well-developed and well-nourished. No distress.  HENT:  Head: Normocephalic and atraumatic.  Right Ear: External ear normal.  Left Ear: External ear normal.  Nose: Nose normal.  Eyes: Conjunctivae and EOM are normal. Pupils are equal, round, and reactive to light.       No nystagmus.   Neck: Normal range of motion. Neck supple. No tracheal deviation present.  Cardiovascular: Normal rate, regular rhythm and normal heart sounds.   Pulmonary/Chest: Effort normal and breath sounds normal. No respiratory distress. He has no wheezes.  Abdominal: Soft. Bowel sounds are normal. He exhibits no distension. There is no tenderness.  Musculoskeletal: Normal range of motion. He exhibits no edema.       Grip strength equal. Strength 5/5  throughout.   Neurological: He is alert and oriented to person, place, and time. No cranial nerve deficit or sensory deficit. Coordination normal.       Finger to nose test normal without ataxia. Cranial nerves 3-12 intact. Negative pronator drift.   Skin: Skin is warm and dry.  Psychiatric: He has a normal mood and affect. His behavior is normal.    ED Course  Procedures (including critical care time)  DIAGNOSTIC STUDIES: Oxygen Saturation is 96% on room air, adequate by my interpretation.    COORDINATION OF CARE:  10:33-Discussed planned course of treatment with the patient including blood work, CT of head, chest x-ray, and aspirin, who is agreeable at this time.   10:45-Medication Orders: Aspirin chewable tablet 324 mg-once.   12:38-Consultation to hospitalist. Discussed pt's case. Pt to be admitted.  Results for orders placed during the hospital encounter of 05/10/12  CBC WITH DIFFERENTIAL      Component Value Range   WBC 7.0  4.0 - 10.5 K/uL   RBC 5.40  4.22 - 5.81 MIL/uL   Hemoglobin 16.9  13.0 - 17.0 g/dL   HCT 96.0  45.4 - 09.8 %   MCV 89.3  78.0 - 100.0 fL   MCH 31.3  26.0 - 34.0 pg   MCHC 35.1  30.0 - 36.0 g/dL   RDW 11.9  14.7 - 82.9 %   Platelets 208  150 - 400 K/uL   Neutrophils Relative 63  43 - 77 %   Neutro Abs 4.4  1.7 - 7.7 K/uL   Lymphocytes Relative 28  12 - 46 %   Lymphs Abs 2.0  0.7 - 4.0 K/uL   Monocytes Relative 6  3 - 12 %   Monocytes Absolute 0.4  0.1 - 1.0 K/uL   Eosinophils Relative 2  0 - 5 %   Eosinophils Absolute 0.2  0.0 - 0.7 K/uL   Basophils Relative 1  0 - 1 %   Basophils Absolute 0.1  0.0 - 0.1 K/uL  BASIC METABOLIC PANEL      Component Value Range   Sodium 136  135 - 145 mEq/L   Potassium 4.1  3.5 - 5.1 mEq/L   Chloride 104  96 - 112 mEq/L   CO2 22  19 - 32 mEq/L   Glucose, Bld 135 (*) 70 - 99 mg/dL   BUN 15  6 - 23 mg/dL   Creatinine, Ser 5.62  0.50 - 1.35 mg/dL   Calcium 9.5  8.4 - 13.0 mg/dL   GFR calc non Af Amer >90  >90  mL/min   GFR calc Af Amer >90  >90 mL/min  TROPONIN I      Component Value Range   Troponin I <0.30  <0.30  ng/mL  D-DIMER, QUANTITATIVE      Component Value Range   D-Dimer, Quant 0.29  0.00 - 0.48 ug/mL-FEU  TROPONIN I      Component Value Range   Troponin I <0.30  <0.30 ng/mL    Ct Head Wo Contrast  05/10/2012  *RADIOLOGY REPORT*  Clinical Data:  Severe hypertension.  Diabetes  CT HEAD WITHOUT CONTRAST  Technique:  Contiguous axial images were obtained from the base of the skull through the vertex without contrast  Comparison:  None.  Findings:  The brain has a normal appearance without evidence for hemorrhage, acute infarction, hydrocephalus, or mass lesion.  There is no extra axial fluid collection.  The skull and paranasal sinuses are normal.  IMPRESSION: Normal CT of the head without contrast.   Original Report Authenticated By: Camelia Phenes, M.D.    Dg Chest Portable 1 View  05/10/2012  *RADIOLOGY REPORT*  Clinical Data: 5-day history of intermittent chest pain.  History of hypertension and COPD.  PORTABLE CHEST - 1 VIEW 05/10/2012 1027 hours:  Comparison: Portable chest x-ray 03/07/2011 [facility]. Two-view chest x-ray 11/12/2009 [facility].  Findings: Cardiomediastinal silhouette unremarkable, unchanged. Lungs clear.  Bronchovascular markings normal.  Pulmonary vascularity normal.  No pneumothorax.  No pleural effusions.  IMPRESSION: No acute cardiopulmonary disease.   Original Report Authenticated By: Arnell Sieving, M.D.      1. TIA (transient ischemic attack)   2. Chest pain   3. Angina at rest   4. Chronic pain   5. Diabetes mellitus   6. HTN (hypertension)       MDM  Left-sided weakness and numbness 5 days ago that lasted about an hour. Recurrent left-sided chest heaviness since then.  Concern for TIA versus CVA. ABCD2 score 5.  Chest pain appears to be atypical for angina but the patient does have risk factors including diabetes  hypertension hyperlipidemia and history of smoking.  If CVA, would expect to have CT findings given that numbness occurred 5 days ago. Admission for chest pain rule out d/w Dr. Benjamine Mola.  Date: 05/10/2012  Rate: 88  Rhythm: normal sinus rhythm and premature atrial contractions (PAC)  QRS Axis: normal  Intervals: normal  ST/T Wave abnormalities: normal  Conduction Disutrbances:right bundle branch block  Narrative Interpretation:   Old EKG Reviewed: none available    I personally performed the services described in this documentation, which was scribed in my presence.  The recorded information has been reviewed and considered.      [name] Octave, MD 05/10/12 [phone]

## 2012-05-10 NOTE — ED Notes (Signed)
Pt reports that on Saturday he experienced and episode of chest tightness and left arm and leg numbness. Numbness lasted about an hour and a half. Chest tightness decreased but never really went away and has been having ongoing chest tightness since Saturday. Went to neurologist (has had 4 neck operations and has a herniated disk in his neck and back.) and was told to come here. Pt reports he is still having a little chest tightness about 5/10 also reports chronic right arm pain, states he cannot tell if the tightness made the arm pain worse. Also has episodes of nausea, light headiness, and SOB.

## 2012-05-10 NOTE — H&P (Signed)
[facility] History and Physical  [name] [name] FAO:[id number] DOB: 1961/02/25 DOA: 05/10/2012  Referring physician: ER PCP: Kirk Ruths, MD   Chief Complaint: chest pain/nausea  HPI: [name] [name] is a 51 y.o. male who has pMHx of HTN, DM, COPD, former smoker.  Who had episode of chest pain on Sunday AM, "like an elephant sitting on his chest".  This was accompanied by weakness and numbness on left side (arm and leg).  This episode went away within 90 minutes.  He then developed another episode on Tuesday that want away when he took his father's nitro.  He came to the ER after seeing his PCP who was concerned about his symptoms.  Has had a head ache for 1 week and associated nausea with the chest pain.  He reports having a chemical stress test years ago but he states he was unable to tolerate it to finish.    Has had 4 neck surgeries in the past.     Review of Systems: all systems reviewed, negative unless stated above   Past Medical History  Diagnosis Date  . COPD (chronic obstructive pulmonary disease)   . DDD (degenerative disc disease)   . DM (diabetes mellitus)     Type II  . HTN (hypertension)   . GERD (gastroesophageal reflux disease)   . Seizures     last seizure 7 yrs ago-unknown etiology  . Anxiety    Past Surgical History  Procedure Date  . Cholecystectomy   . Cervical disc surgery     X4  . Elbow surgery     right  . Colonoscopy 08/24/2011    Procedure: COLONOSCOPY;  Surgeon: Corbin Ade, MD;  Location: [facility];  Service: Endoscopy;  Laterality: N/A;  7:30AM   Social History:  reports that he quit smoking about 10 months ago. His smoking use included Cigarettes. He has a 45 pack-year smoking history. He does not have any smokeless tobacco history on file. He reports that he does not drink alcohol or use illicit drugs. From home   No Known Allergies  Family History  Problem Relation Age of Onset  . Colon cancer Neg Hx   . Anesthesia  problems Neg Hx   . Hypotension Neg Hx   . Malignant hyperthermia Neg Hx   . Pseudochol deficiency Neg Hx   . Hypertension Father   . Heart attack Father     Prior to Admission medications   Medication Sig Start Date End Date Taking? Authorizing Provider  albuterol (PROVENTIL HFA;VENTOLIN HFA) 108 (90 BASE) MCG/ACT inhaler Inhale 2 puffs into the lungs every 6 (six) hours as needed. Shortness of breath    Yes Historical Provider, MD  ondansetron (ZOFRAN) 4 MG tablet Take 4 mg by mouth every 8 (eight) hours as needed. Nausea   Yes Historical Provider, MD  alprazolam Prudy Feeler) 2 MG tablet Take 2 mg by mouth at bedtime as needed. sleep    Historical Provider, MD  carbamazepine (TEGRETOL) 200 MG tablet Take 200 mg by mouth 2 (two) times daily.     Historical Provider, MD  lisinopril (PRINIVIL,ZESTRIL) 20 MG tablet Take 20 mg by mouth daily.      Historical Provider, MD  naproxen (NAPROSYN) 500 MG tablet Take 500 mg by mouth 2 (two) times daily with a meal.      Historical Provider, MD  oxyCODONE (OXYCONTIN) 15 MG TB12 Take 15 mg by mouth 2 (two) times daily as needed. Pain    Historical Provider,  MD  oxyCODONE (OXYCONTIN) 40 MG 12 hr tablet Take 40 mg by mouth 2 (two) times daily as needed. Pain    Historical Provider, MD  predniSONE (DELTASONE) 5 MG tablet Take 5 mg by mouth daily.      Historical Provider, MD   Physical Exam: Filed Vitals:   05/10/12 1015 05/10/12 1130 05/10/12 1200 05/10/12 1230  BP: 168/89 139/88 165/97 137/81  Pulse: 87 76 72 73  Temp: 98.5 F (36.9 C)     TempSrc: Oral     Resp: 17 24 17 23   Height: 5\' 8"  (1.727 m)     Weight: 83.915 kg (185 lb)     SpO2: 96% 94% 97% 94%     General:  A+Ox3, NAD,   Eyes: WNL  ENT: WNL  Neck: supple  Cardiovascular: rrr  Respiratory: dec BS B/l, no wheezing  Abdomen: +BS, soft, NT/ND  Skin: no rashes or lesions  Musculoskeletal: moves all 4 extremities  Psychiatric: no SI/no HI  Neurologic: no focal deficits, CN  2-12 intact  Labs on Admission:  Basic Metabolic Panel:  Lab 05/10/12 1610  NA 136  K 4.1  CL 104  CO2 22  GLUCOSE 135*  BUN 15  CREATININE 0.88  CALCIUM 9.5  MG --  PHOS --   Liver Function Tests: No results found for this basename: AST:5,ALT:5,ALKPHOS:5,BILITOT:5,PROT:5,ALBUMIN:5 in the last 168 hours No results found for this basename: LIPASE:5,AMYLASE:5 in the last 168 hours No results found for this basename: AMMONIA:5 in the last 168 hours CBC:  Lab 05/10/12 1021  WBC 7.0  NEUTROABS 4.4  HGB 16.9  HCT 48.2  MCV 89.3  PLT 208   Cardiac Enzymes:  Lab 05/10/12 1021  CKTOTAL --  CKMB --  CKMBINDEX --  TROPONINI <0.30    BNP (last 3 results) No results found for this basename: PROBNP:3 in the last 8760 hours CBG: No results found for this basename: GLUCAP:5 in the last 168 hours  Radiological Exams on Admission: Ct Head Wo Contrast  05/10/2012  *RADIOLOGY REPORT*  Clinical Data:  Severe hypertension.  Diabetes  CT HEAD WITHOUT CONTRAST  Technique:  Contiguous axial images were obtained from the base of the skull through the vertex without contrast  Comparison:  None.  Findings:  The brain has a normal appearance without evidence for hemorrhage, acute infarction, hydrocephalus, or mass lesion.  There is no extra axial fluid collection.  The skull and paranasal sinuses are normal.  IMPRESSION: Normal CT of the head without contrast.   Original Report Authenticated By: Camelia Phenes, M.D.    Dg Chest Portable 1 View  05/10/2012  *RADIOLOGY REPORT*  Clinical Data: 5-day history of intermittent chest pain.  History of hypertension and COPD.  PORTABLE CHEST - 1 VIEW 05/10/2012 1027 hours:  Comparison: Portable chest x-ray 03/07/2011 [facility]. Two-view chest x-ray 11/12/2009 [facility].  Findings: Cardiomediastinal silhouette unremarkable, unchanged. Lungs clear.  Bronchovascular markings normal.  Pulmonary vascularity normal.  No pneumothorax.  No  pleural effusions.  IMPRESSION: No acute cardiopulmonary disease.   Original Report Authenticated By: Arnell Sieving, M.D.     EKG: RBBB, sinus with PAC  Assessment/Plan Active Problems:  Angina at rest  HTN (hypertension)  Chronic pain  Diabetes mellitus   1. Angina- at rest and SOB with exertion, patient had a history of smoking, DM, HTN, had a stress test years ago with complications, ask cardiology to see, trend troponins: negative so far, EKG, echo, check FLP 2. Chronic  neck pain- resume home medications 3. HTN- add BB 4. DM- check HgbA1c and place on SSI 5. ?TIA- CT scan of his head did not show any stoke (symptoms on Sunday morning).  Monitor on tele and for further symptoms 6. COPD- not on home O2, not having wheezing although he does have SOB with exertion    Code Status: full Family Communication: patient at bedside Disposition Plan: home when better  Time spent: > 70 min  Taylorann Tkach [facility] Pager [phone]  If 7PM-7AM, please contact night-coverage [email or web address] Password [email or web address] 05/10/2012, 1:31 PM

## 2012-05-10 NOTE — ED Notes (Signed)
hospitalist in to see pt.

## 2012-05-10 NOTE — ED Notes (Signed)
Patient transported to CT. Pt stable, alert, and ABCs intact.

## 2012-05-10 NOTE — Progress Notes (Signed)
*  PRELIMINARY RESULTS* Echocardiogram 2D Echocardiogram has been performed.  Conrad [place] 05/10/2012, 3:16 PM

## 2012-05-10 NOTE — Consult Note (Signed)
CARDIOLOGY CONSULT NOTE  Patient ID: SABAN HEINLEN MRN: [id number] DOB/AGE: 1961-04-12 51 y.o.  Admit date: 05/10/2012 Referring Physician:[facility] Primary PhysicianMCGOUGH,WILLIAM M, MD Reason for Consultation: Chest Pain  Active Problems:  Angina at rest  HTN (hypertension)  Chronic pain  Diabetes mellitus  HPI: Mr. Venard is a 51 year old patient of Dr. Regino Schultze and Dr. 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 The events began 5 days ago while lying in bed, with sudden onset of chest pressure, and left-sided numbness on the left side of his face down to the left arm and leg with numbness and tingling. He states it lasted about an hour and a half. He normally has some numbness and tingling in both hands, but this was more severe and unilateral, this had not occurred before, to that severity.  The sensation returned to his face and left arm and leg, but chest pressure remained. He states the pressure in his chest was not severe, but he did have some difficulty taking deep breaths. He states the following few days he was in his usual state of health but continued to have some pressure in his chest which was not limiting to him. On Tuesday, he again had recurrence of the left-sided numbness and tingling into his left arm with increased chest pressure. He saw Dr. Channing Mutters, who stated his blood pressure was mildly elevated in the 145-150 range and he referred him back to his PCP. On arrival to his PCPs office he complained of recurrent chest pressure and was referred to the emergency room.    In the emergency room the patient's blood pressure was found to be 168/89, heart rate 87, afebrile. Review of her labs demonstrated no evidence of hypokalemia, renal insufficiency, or anemia.  Cardiac enzymes are found be negative x3. Secondary to recurrent pressure in his chest and multiple cardiovascular risk factors, we are asked for cardiac evaluation and recommendations. Echocardiogram has been ordered and completed with results pending.  Review of systems complete and found to be negative unless listed above   Past Medical History  Diagnosis Date  . COPD (chronic obstructive pulmonary disease)   . DDD (degenerative disc disease)   . DM (diabetes mellitus)     Type II  . HTN (hypertension)   . GERD (gastroesophageal reflux disease)   . Seizures     last seizure 7 yrs ago-unknown etiology  . Anxiety     Family History  Problem Relation Age of Onset  . Colon cancer Neg Hx   . Anesthesia problems Neg Hx   . Hypotension Neg Hx   . Malignant hyperthermia Neg Hx   . Pseudochol deficiency Neg Hx   . Hypertension Father   . Heart attack Father     History   Social History  . Marital Status: Divorced    Spouse Name: N/A    Number of Children: N/A  . Years of Education: N/A   Occupational History  . disability    Social History Main Topics  . Smoking status: Former Smoker -- 1.5 packs/day for 30 years    Types: Cigarettes    Quit date: 06/24/2011  . Smokeless tobacco: Not on file   Comment: 1-3 cigarettes   . Alcohol Use: No  . Drug Use: No  . Sexually Active: Yes  Birth Control/ Protection: None   Other Topics Concern  . Not on file   Social History Narrative  . No narrative on file   Past Surgical History  Procedure Date  . Cholecystectomy   . Cervical disc surgery     X4  . Elbow surgery     right  . Colonoscopy 08/24/2011    Procedure: COLONOSCOPY;  Surgeon: Corbin Ade, MD;  Location: [facility];  Service: Endoscopy;  Laterality: N/A;  7:30AM     Prescriptions prior to admission  Medication Sig Dispense Refill  . albuterol (PROVENTIL HFA;VENTOLIN HFA) 108 (90 BASE) MCG/ACT inhaler Inhale 2 puffs into the lungs every 6 (six) hours  as needed. Shortness of breath       . ondansetron (ZOFRAN) 4 MG tablet Take 4 mg by mouth every 8 (eight) hours as needed. Nausea      . alprazolam (XANAX) 2 MG tablet Take 2 mg by mouth at bedtime as needed. sleep      . carbamazepine (TEGRETOL) 200 MG tablet Take 200 mg by mouth 2 (two) times daily.       Marland Kitchen lisinopril (PRINIVIL,ZESTRIL) 20 MG tablet Take 20 mg by mouth daily.        . naproxen (NAPROSYN) 500 MG tablet Take 500 mg by mouth 2 (two) times daily with a meal.        . oxyCODONE (OXYCONTIN) 15 MG TB12 Take 15 mg by mouth 2 (two) times daily as needed. Pain      . oxyCODONE (OXYCONTIN) 40 MG 12 hr tablet Take 40 mg by mouth 2 (two) times daily as needed. Pain      . predniSONE (DELTASONE) 5 MG tablet Take 5 mg by mouth daily.         Stress Test 2007 FINDINGS CLINICAL DATA: CHEST PAIN. MYOCARDIAL PERFUSION STUDY - 04/22/2003 THE PATIENT UNDERWENT A SINGLE DAY ADENOSINE CARDIOLITE STUDY WITH 10 mCi  SESTAMIBI INJECTED IV PRIOR TO REST IMAGES AND 30 mCi PRIOR TO STRESS IMAGES. MYOCARDIAL PERFUSION WITH SPECT NO REVERSIBLE OR IRREVERSIBLE DEFECTS ARE SEEN TO SUGGEST ISCHEMIA OR  INFARCTION. EJECTION FRACTION THE EJECTION FRACTION IS 51% WITH END-DIASTOLIC VOLUME OF 118 ML AND  END-SYSTOLIC VOLUME OF 58 ML. WALL MOTION ANALYSIS THERE IS NORMAL WALL MOTION AND WALL THICKENING IN ALL REGIONS. IMPRESSION NO EVIDENCE OF ISCHEMIA OR INFARCTION. EJECTION FRACTION 51%.   Physical Exam: Blood pressure 161/89, pulse 72, temperature 98.1 F (36.7 C), temperature source Oral, resp. rate 24, height 5\' 8"  (1.727 m), weight 182 lb 9.6 oz (82.827 kg), SpO2 98.00%.   General: Well developed, well nourished, in no acute distress Head: Eyes PERRLA, No xanthomas.   Normal cephalic and atramatic  Lungs: Clear bilaterally to auscultation and percussion. Heart: HRRR S1 S2, without MRG.  Pulses are 2+ & equal.            No carotid bruit. No JVD.  No abdominal bruits. No femoral  bruits. Abdomen: Bowel sounds are positive, abdomen soft and non-tender without masses or                  Hernia's noted. Msk:  Back normal, normal gait. Normal strength and tone for age. Extremities: No clubbing, cyanosis or edema.  DP +1 Neuro: Alert and oriented X 3. Difficult to completely close his hands to a grip secondary to mild Edema. Psych:  Good affect, responds appropriately  Labs:   Lab Results  Component Value Date   WBC  7.0 05/10/2012   HGB 16.9 05/10/2012   HCT 48.2 05/10/2012   MCV 89.3 05/10/2012   PLT 208 05/10/2012    Lab 05/10/12 1021  NA 136  K 4.1  CL 104  CO2 22  BUN 15  CREATININE 0.88  CALCIUM 9.5  PROT --  BILITOT --  ALKPHOS --  ALT --  AST --  GLUCOSE 135*   Lab Results  Component Value Date   CKTOTAL 95 03/07/2011   CKMB 1.3 03/07/2011   TROPONINI <0.30 05/10/2012        Radiology: Ct Head Wo Contrast  05/10/2012  *RADIOLOGY REPORT*  Clinical Data:  Severe hypertension.  Diabetes  CT HEAD WITHOUT CONTRAST  Technique:  Contiguous axial images were obtained from the base of the skull through the vertex without contrast  Comparison:  None.  Findings:  The brain has a normal appearance without evidence for hemorrhage, acute infarction, hydrocephalus, or mass lesion.  There is no extra axial fluid collection.  The skull and paranasal sinuses are normal.  IMPRESSION: Normal CT of the head without contrast.   Original Report Authenticated By: Camelia Phenes, M.D.    Dg Chest Portable 1 View  05/10/2012  *RADIOLOGY REPORT*  Clinical Data: 5-day history of intermittent chest pain.  History of hypertension and COPD.  PORTABLE CHEST - 1 VIEW 05/10/2012 1027 hours:  Comparison: Portable chest x-ray 03/07/2011 [facility]. Two-view chest x-ray 11/12/2009 [facility].  Findings: Cardiomediastinal silhouette unremarkable, unchanged. Lungs clear.  Bronchovascular markings normal.  Pulmonary vascularity normal.  No pneumothorax.  No pleural  effusions.  IMPRESSION: No acute cardiopulmonary disease.   Original Report Authenticated By: Arnell Sieving, M.D.    EKG:NSR with PAC's, Incomplete RBBB.  ASSESSMENT AND PLAN:   1. Chest Pain: Patient has typical and atypical features for cardiac etiology of chest discomfort. Cardiac enzymes are found be negative. Echocardiogram has been completed with results pending. Has cardiovascular risk factors. It is  reasonable to proceed with a Myoview for diagnostic/prognostic purposes. This will be completed in the morning with further recommendations based upon test results. In the interim the patient should continue aspirin, and statin. It is possible that the pain he is experiencing is related to cervical disc disease.  2. Hypertension: Blood pressure was initially elevated on admission, review of blood pressure trend does show elevation in blood pressure this afternoon as well. He is currently on lisinopril 20 mg daily along with metoprolol 12.5 mg twice a day. Can consider increasing the lisinopril to 20 mg twice a day if blood pressure remains elevated into the evening hours. We will follow  3. Chronic pain with cervical disc disease: He is followed by Dr. Channing Mutters in [place], with known history of multiple neck surgeries.  Cannot rule out neurological component to his hemiparesis, although transient.  Signed: Bettey Mare. Lyman Bishop NP [facility] 05/10/2012, 4:01 PM Co-Sign MD   Attending note:  Patient seen and examined. Reviewed available records as well as a database reported above. He is now admitted to the hospital with recent episodes of chest pressure associated with or near the time of a feeling of left-sided tingling and numbness spanning from his head to his leg. This has occurred at rest. Also feeling of shortness of breath when he has chest discomfort. He has had at least 2 or 3 episodes of this recently, some in the past as well. He has no documented history of CAD, underwent  stress testing back in 2004  that demonstrated no ischemia.  10 he has been hemodynamically stable, somewhat hypertensive. Lungs are clear without labored breathing, cardiac exam without gallop, soft systolic murmur. No peripheral edema. Lab work shows normal renal function, normal troponin levels, normal hemoglobin and platelet count. Head CT negative without contrast. Echocardiogram shows normal LVEF with no focal wall motion abnormalities and no major valvular abnormalities. ECG nonspecific with right bundle branch block.  Plan will be to arrange an inpatient Lexiscan Myoview for tomorrow morning for further assessment of ischemia. In the meanwhile, agree with aspirin and statin therapy. Followup ECG in the morning.  Jonelle Sidle, M.D., F.A.C.C.

## 2012-05-10 NOTE — ED Notes (Addendum)
Pt also reports dull achy HA x 1 week. States CP is worse at night and when lying down. See triage note for additional details.

## 2012-05-11 ENCOUNTER — Inpatient Hospital Stay (HOSPITAL_COMMUNITY): Payer: Medicare Other

## 2012-05-11 ENCOUNTER — Encounter (HOSPITAL_COMMUNITY): Payer: Self-pay | Admitting: Cardiology

## 2012-05-11 ENCOUNTER — Encounter (HOSPITAL_COMMUNITY): Payer: Self-pay

## 2012-05-11 DIAGNOSIS — K219 Gastro-esophageal reflux disease without esophagitis: Secondary | ICD-10-CM | POA: Diagnosis present

## 2012-05-11 DIAGNOSIS — IMO0002 Reserved for concepts with insufficient information to code with codable children: Secondary | ICD-10-CM | POA: Diagnosis not present

## 2012-05-11 DIAGNOSIS — R079 Chest pain, unspecified: Secondary | ICD-10-CM

## 2012-05-11 DIAGNOSIS — G459 Transient cerebral ischemic attack, unspecified: Secondary | ICD-10-CM | POA: Diagnosis not present

## 2012-05-11 DIAGNOSIS — F411 Generalized anxiety disorder: Secondary | ICD-10-CM | POA: Diagnosis present

## 2012-05-11 DIAGNOSIS — I209 Angina pectoris, unspecified: Secondary | ICD-10-CM | POA: Diagnosis not present

## 2012-05-11 DIAGNOSIS — R569 Unspecified convulsions: Secondary | ICD-10-CM | POA: Diagnosis present

## 2012-05-11 DIAGNOSIS — G8929 Other chronic pain: Secondary | ICD-10-CM | POA: Diagnosis not present

## 2012-05-11 DIAGNOSIS — M5030 Other cervical disc degeneration, unspecified cervical region: Secondary | ICD-10-CM | POA: Diagnosis not present

## 2012-05-11 DIAGNOSIS — R29898 Other symptoms and signs involving the musculoskeletal system: Secondary | ICD-10-CM | POA: Diagnosis not present

## 2012-05-11 DIAGNOSIS — J449 Chronic obstructive pulmonary disease, unspecified: Secondary | ICD-10-CM | POA: Diagnosis not present

## 2012-05-11 DIAGNOSIS — Z87891 Personal history of nicotine dependence: Secondary | ICD-10-CM | POA: Diagnosis not present

## 2012-05-11 DIAGNOSIS — R0789 Other chest pain: Secondary | ICD-10-CM | POA: Diagnosis not present

## 2012-05-11 DIAGNOSIS — I10 Essential (primary) hypertension: Secondary | ICD-10-CM | POA: Diagnosis not present

## 2012-05-11 DIAGNOSIS — Z79899 Other long term (current) drug therapy: Secondary | ICD-10-CM | POA: Diagnosis not present

## 2012-05-11 DIAGNOSIS — E119 Type 2 diabetes mellitus without complications: Secondary | ICD-10-CM | POA: Diagnosis not present

## 2012-05-11 DIAGNOSIS — Z8249 Family history of ischemic heart disease and other diseases of the circulatory system: Secondary | ICD-10-CM | POA: Diagnosis not present

## 2012-05-11 LAB — LIPID PANEL
Cholesterol: 171 mg/dL (ref 0–200)
HDL: 37 mg/dL — ABNORMAL LOW (ref >39)
Total CHOL/HDL Ratio: 4.6 RATIO
Triglycerides: 196 mg/dL — ABNORMAL HIGH (ref <150)
VLDL: 39 mg/dL (ref 0–40)

## 2012-05-11 LAB — GLUCOSE, CAPILLARY: Glucose-Capillary: 96 mg/dL (ref 70–99)

## 2012-05-11 LAB — BASIC METABOLIC PANEL
Calcium: 9.7 mg/dL (ref 8.4–10.5)
Creatinine, Ser: 1.14 mg/dL (ref 0.50–1.35)
GFR calc non Af Amer: 72 mL/min — ABNORMAL LOW (ref >90)
Sodium: 137 mEq/L (ref 135–145)

## 2012-05-11 LAB — CBC
MCH: 31.7 pg (ref 26.0–34.0)
MCHC: 35 g/dL (ref 30.0–36.0)
MCV: 90.5 fL (ref 78.0–100.0)
Platelets: 199 10*3/uL (ref 150–400)
RBC: 4.76 MIL/uL (ref 4.22–5.81)
RDW: 12.2 % (ref 11.5–15.5)

## 2012-05-11 LAB — HEMOGLOBIN A1C
Hgb A1c MFr Bld: 5.7 % — ABNORMAL HIGH (ref <5.7)
Mean Plasma Glucose: 117 mg/dL — ABNORMAL HIGH (ref <117)

## 2012-05-11 MED ORDER — BIOTENE DRY MOUTH MT LIQD
15.0000 mL | Freq: Two times a day (BID) | OROMUCOSAL | Status: DC
  Administered 2012-05-11 – 2012-05-12 (×3): 15 mL via OROMUCOSAL

## 2012-05-11 MED ORDER — TECHNETIUM TC 99M TETROFOSMIN IV KIT
10.0000 | PACK | Freq: Once | INTRAVENOUS | Status: AC | PRN
  Administered 2012-05-11: 9.5 via INTRAVENOUS

## 2012-05-11 MED ORDER — TECHNETIUM TC 99M TETROFOSMIN IV KIT
30.0000 | PACK | Freq: Once | INTRAVENOUS | Status: AC | PRN
  Administered 2012-05-11: 30 via INTRAVENOUS

## 2012-05-11 MED ORDER — OXYCODONE HCL 20 MG PO TB12: 40.0000 mg | ORAL_TABLET | Freq: Three times a day (TID) | ORAL | Status: DC | PRN

## 2012-05-11 MED ORDER — SODIUM CHLORIDE 0.9 % IJ SOLN
INTRAMUSCULAR | Status: AC
  Administered 2012-05-11: 10 mL via INTRAVENOUS
  Filled 2012-05-11: qty 10

## 2012-05-11 MED ORDER — OXYCODONE HCL 5 MG PO TABS
15.0000 mg | ORAL_TABLET | Freq: Four times a day (QID) | ORAL | Status: DC | PRN
  Administered 2012-05-11: 15 mg via ORAL
  Filled 2012-05-11: qty 3

## 2012-05-11 MED ORDER — LEVOFLOXACIN 500 MG PO TABS
750.0000 mg | ORAL_TABLET | Freq: Every day | ORAL | Status: DC
  Administered 2012-05-11 – 2012-05-12 (×2): 750 mg via ORAL
  Filled 2012-05-11 (×4): qty 1

## 2012-05-11 MED ORDER — SODIUM CHLORIDE 0.9 % IJ SOLN
INTRAMUSCULAR | Status: AC
  Administered 2012-05-11: 10 mL
  Filled 2012-05-11: qty 3

## 2012-05-11 MED ORDER — REGADENOSON 0.4 MG/5ML IV SOLN
INTRAVENOUS | Status: AC
  Administered 2012-05-11: 0.4 mg via INTRAVENOUS
  Filled 2012-05-11: qty 5

## 2012-05-11 MED ORDER — MECLIZINE HCL 12.5 MG PO TABS
25.0000 mg | ORAL_TABLET | Freq: Two times a day (BID) | ORAL | Status: DC | PRN
  Administered 2012-05-11: 25 mg via ORAL
  Filled 2012-05-11: qty 2

## 2012-05-11 NOTE — Progress Notes (Addendum)
[facility] PROGRESS NOTE  [name] [name] RUE:[id number] DOB: 1961-05-17 DOA: 05/10/2012 PCP: Kirk Ruths, MD  Assessment/Plan: Active Problems:  Angina at rest  HTN (hypertension)  Chronic pain  Diabetes mellitus  1. Angina- for stress test today, ASA, echo with mild diastolic dfxn 2. Chronic neck pain- resume home medications- takes PRN 3. HTN- was not able to tolerate BB due to bradycardia, continue lisinopril 4. DM-  HgbA1c: 5.7 and place on SSI 96-200 5. ?TIA- CT scan of his head did not show any stoke (symptoms on Sunday morning). Monitor on tele and for further symptoms 6. COPD- not on home O2, not having wheezing although he does have SOB with exertion- coughing up green sputum so will place on levaquin  Code Status: full Family Communication: patient at bedside Disposition Plan: home after stress test if ok   Consultants:  cardiology  Procedures: Stress test  Antibiotics:  levaquin  HPI/Subjective: Coughing up sputum this AM, greenish in color   Objective: Filed Vitals:   05/10/12 1554 05/10/12 2100 05/11/12 0424 05/11/12 0752  BP:  115/72 111/71   Pulse: 72 64 52 63  Temp:  98.5 F (36.9 C) 97.6 F (36.4 C)   TempSrc:  Oral Oral   Resp: 24 22 20    Height:      Weight:      SpO2: 98% 99% 100% 94%    Intake/Output Summary (Last 24 hours) at 05/11/12 0917 Last data filed at 05/11/12 0706  Gross per 24 hour  Intake    973 ml  Output      4 ml  Net    969 ml   Filed Weights   05/10/12 1015 05/10/12 1403  Weight: 83.915 kg (185 lb) 82.827 kg (182 lb 9.6 oz)    Exam:   General: A+Ox3, NAD  Cardiovascular: rrr  Respiratory: clear anterior, no wheezing  Abdomen: +BS, soft, NT/ND  Data Reviewed: Basic Metabolic Panel:  Lab 05/11/12 1478 05/10/12 1021  NA 137 136  K 4.1 4.1  CL 103 104  CO2 29 22  GLUCOSE 110* 135*  BUN 20 15  CREATININE 1.14 0.88  CALCIUM 9.7 9.5  MG -- --  PHOS -- --   Liver Function Tests: No  results found for this basename: AST:5,ALT:5,ALKPHOS:5,BILITOT:5,PROT:5,ALBUMIN:5 in the last 168 hours No results found for this basename: LIPASE:5,AMYLASE:5 in the last 168 hours No results found for this basename: AMMONIA:5 in the last 168 hours CBC:  Lab 05/11/12 0211 05/10/12 1021  WBC 7.4 7.0  NEUTROABS -- 4.4  HGB 15.1 16.9  HCT 43.1 48.2  MCV 90.5 89.3  PLT 199 208   Cardiac Enzymes:  Lab 05/11/12 0210 05/10/12 1956 05/10/12 1411 05/10/12 1021  CKTOTAL -- -- -- --  CKMB -- -- -- --  CKMBINDEX -- -- -- --  TROPONINI <0.30 <0.30 <0.30 <0.30   BNP (last 3 results) No results found for this basename: PROBNP:3 in the last 8760 hours CBG:  Lab 05/11/12 0756 05/10/12 2135 05/10/12 1620  GLUCAP 96 114* 200*    No results found for this or any previous visit (from the past 240 hour(s)).   Studies: Ct Head Wo Contrast  05/10/2012  *RADIOLOGY REPORT*  Clinical Data:  Severe hypertension.  Diabetes  CT HEAD WITHOUT CONTRAST  Technique:  Contiguous axial images were obtained from the base of the skull through the vertex without contrast  Comparison:  None.  Findings:  The brain has a normal appearance without evidence for hemorrhage, acute  infarction, hydrocephalus, or mass lesion.  There is no extra axial fluid collection.  The skull and paranasal sinuses are normal.  IMPRESSION: Normal CT of the head without contrast.   Original Report Authenticated By: Camelia Phenes, M.D.    Dg Chest Portable 1 View  05/10/2012  *RADIOLOGY REPORT*  Clinical Data: 5-day history of intermittent chest pain.  History of hypertension and COPD.  PORTABLE CHEST - 1 VIEW 05/10/2012 1027 hours:  Comparison: Portable chest x-ray 03/07/2011 [facility]. Two-view chest x-ray 11/12/2009 [facility].  Findings: Cardiomediastinal silhouette unremarkable, unchanged. Lungs clear.  Bronchovascular markings normal.  Pulmonary vascularity normal.  No pneumothorax.  No pleural effusions.  IMPRESSION:  No acute cardiopulmonary disease.   Original Report Authenticated By: Arnell Sieving, M.D.     Scheduled Meds:   . antiseptic oral rinse  15 mL Mouth Rinse BID  . aspirin  324 mg Oral Once  . aspirin EC  81 mg Oral Daily  . carbamazepine  200 mg Oral BID  . enoxaparin (LOVENOX) injection  40 mg Subcutaneous Q24H  . insulin aspart  0-15 Units Subcutaneous TID WC  . insulin aspart  0-5 Units Subcutaneous QHS  . levofloxacin  750 mg Oral Daily  . lisinopril  20 mg Oral BID  . naproxen  500 mg Oral BID WC  . simvastatin  20 mg Oral q1800  . sodium chloride  3 mL Intravenous Q12H  . sodium chloride      . DISCONTD: sodium chloride   Intravenous STAT  . DISCONTD: lisinopril  20 mg Oral Daily  . DISCONTD: metoprolol tartrate  12.5 mg Oral BID  . DISCONTD: oxyCODONE  15 mg Oral Q12H  . DISCONTD: oxyCODONE  40 mg Oral Q12H  . DISCONTD: oxyCODONE  40 mg Oral Q12H   Continuous Infusions:   Active Problems:  Angina at rest  HTN (hypertension)  Chronic pain  Diabetes mellitus    Time spent: 25 min    Marlin Canary  [facility] Pager [phone] 05/11/2012, 9:17 AM  LOS: 1 day

## 2012-05-11 NOTE — Progress Notes (Signed)
Pt returned from stress test.  Pt c/o of nausea.  Stated he had the "dry heaves" during the test.  PRN Zofran administered.  Fara Chute, RN 05/11/2012

## 2012-05-11 NOTE — Plan of Care (Signed)
Problem: Phase I Progression Outcomes Goal: Voiding-avoid urinary catheter unless indicated Outcome: Completed/Met Date Met:  05/11/12 Pt ambulating to bathroom with supervision.

## 2012-05-11 NOTE — Progress Notes (Addendum)
SUBJECTIVE: Dizziness this morning. Coughing up green sputum. Still has mild intermittent chest pain.  LABS: Basic Metabolic Panel:  Basename 05/11/12 0211 05/10/12 1021  NA 137 136  K 4.1 4.1  CL 103 104  CO2 29 22  GLUCOSE 110* 135*  BUN 20 15  CREATININE 1.14 0.88  CALCIUM 9.7 9.5  MG -- --  PHOS -- --     CBC:  Basename 05/11/12 0211 05/10/12 1021  WBC 7.4 7.0  NEUTROABS -- 4.4  HGB 15.1 16.9  HCT 43.1 48.2  MCV 90.5 89.3  PLT 199 208   Cardiac Enzymes:  Basename 05/11/12 0210 05/10/12 1956 05/10/12 1411  CKTOTAL -- -- --  CKMB -- -- --  CKMBINDEX -- -- --  TROPONINI <0.30 <0.30 <0.30   BNP: No components found with this basename: POCBNP:3 D-Dimer:  Basename 05/10/12 1021  DDIMER 0.29   Hemoglobin A1C:  Basename 05/10/12 1021  HGBA1C 5.7*   Fasting Lipid Panel:  Basename 05/11/12 0219  CHOL 171  HDL 37*  LDLCALC 95  TRIG 161*  CHOLHDL 4.6  LDLDIRECT --   RADIOLOGY: Ct Head Wo Contrast  05/10/2012  *RADIOLOGY REPORT*  Clinical Data:  Severe hypertension.  Diabetes  CT HEAD WITHOUT CONTRAST  Technique:  Contiguous axial images were obtained from the base of the skull through the vertex without contrast  Comparison:  None.  Findings:  The brain has a normal appearance without evidence for hemorrhage, acute infarction, hydrocephalus, or mass lesion.  There is no extra axial fluid collection.  The skull and paranasal sinuses are normal.  IMPRESSION: Normal CT of the head without contrast.   Original Report Authenticated By: Camelia Phenes, M.D.    Dg Chest Portable 1 View  05/10/2012  *RADIOLOGY REPORT*  Clinical Data: 5-day history of intermittent chest pain.  History of hypertension and COPD.  PORTABLE CHEST - 1 VIEW 05/10/2012 1027 hours:  Comparison: Portable chest x-ray 03/07/2011 [facility]. Two-view chest x-ray 11/12/2009 [facility].  Findings: Cardiomediastinal silhouette unremarkable, unchanged. Lungs clear.  Bronchovascular  markings normal.  Pulmonary vascularity normal.  No pneumothorax.  No pleural effusions.  IMPRESSION: No acute cardiopulmonary disease.   Original Report Authenticated By: Arnell Sieving, M.D.     PHYSICAL EXAM BP 111/71  Pulse 63  Temp 97.6 F (36.4 C) (Oral)  Resp 20  Ht 5\' 8"  (1.727 m)  Wt 182 lb 9.6 oz (82.827 kg)  BMI 27.76 kg/m2  SpO2 94% General: Well developed, well nourished, in no acute distress Head: Eyes PERRLA, No xanthomas.   Normal cephalic and atramatic  Lungs: Clear bilaterally to auscultation and percussion. Heart: HRRR S1 S2, bradycardic No MRG .  Pulses are 2+ & equal.            No carotid bruit. No JVD.  No abdominal bruits. No femoral bruits. Abdomen: Bowel sounds are positive, abdomen soft and non-tender without masses or                  Hernia's noted. Msk:  Back normal, normal gait. Normal strength and tone for age. Extremities: No clubbing, cyanosis or edema.  DP +1 Neuro: Alert and oriented X 3. Psych:  Good affect, responds appropriately  TELEMETRY: Reviewed telemetry pt in Sinus bradycardia rates into the 40's.:  ASSESSMENT AND PLAN:  1. Chest Pain: Planned for stress test todau. He had significant bradycardia overnight the 40s and has had this morning some dizziness and lightheadedness. Discontinue metoprolol for now. Also started  on antibiotics by primary service with COPD, possible URI.. More recommendations post stress test.  Bettey Mare. Lyman Bishop NP [facility] 05/11/2012, 8:11 AM   Attending note:  Patient seen and examined. Reviewed interval database. Cardiac enzymes are normal. ECG shows sinus bradycardia with PAC and probable early repolarization changes. LV function normal by echocardiogram done yesterday. Plan is to pursue a Myoview today for ischemic evaluation, further plans to follow.  Jonelle Sidle, M.D., F.A.C.C.

## 2012-05-11 NOTE — Progress Notes (Signed)
UR Chart Review Completed  

## 2012-05-11 NOTE — Progress Notes (Signed)
Patient still complaining of nausea but is going to try to eat dinner.  Dizziness is better, HR better.  Plan to monitor overnight and hope for D/C in AM  [facility]

## 2012-05-11 NOTE — Plan of Care (Signed)
Problem: Phase II Progression Outcomes Goal: Stress Test if indicated Outcome: Completed/Met Date Met:  05/11/12 Completed today.

## 2012-05-11 NOTE — Progress Notes (Signed)
Stress Lab Nurses Notes - [facility] 05/11/2012  Reason for doing test: Chest Pain  Type of test: Steffanie Dunn  Nurse performing test: Marlena Clipper, RN  Nuclear Medicine Tech: Lyndel Pleasure  Echo Tech: Not Applicable  MD performing test: Joni Reining NP  Family MD: Dr. Cherre Huger  Test explained and consent signed: yes  IV started: 22g jelco, Saline lock flushed, No redness or edema and Saline lock from floor  Symptoms: Hot and nauseated Treatment/Intervention: None  Reason test stopped: protocol completed  After recovery IV was: No redness or edema and Saline Lock flushed  Patient to return to Nuc. Med at :13:45  Patient discharged: Transported back to room 322 via WC  Patient's Condition upon discharge was: stable  Comments: Patient had a lexiscan/myoview stress test. Resting HR was 60 and resting BP was 101/71 and peak HR was 93 and peak BP was 125/62. Symptoms resolved in recovery.  Angelica Pou

## 2012-05-11 NOTE — Progress Notes (Signed)
Pt ambulated to bathroom and back to bed.  Pt c/o of dizziness after ambulating.  BP 105/65.  MD notified.  Dr. Benjamine Mola returned page.  Stated she would enter order for Antivert prn.  Fara Chute, RN 05/11/2012

## 2012-05-12 ENCOUNTER — Other Ambulatory Visit: Payer: Self-pay

## 2012-05-12 DIAGNOSIS — G459 Transient cerebral ischemic attack, unspecified: Principal | ICD-10-CM

## 2012-05-12 LAB — GLUCOSE, CAPILLARY

## 2012-05-12 MED ORDER — LISINOPRIL 20 MG PO TABS: 20.0000 mg | ORAL_TABLET | Freq: Two times a day (BID) | ORAL | Status: DC

## 2012-05-12 MED ORDER — SIMVASTATIN 20 MG PO TABS: 20.0000 mg | ORAL_TABLET | Freq: Every day | ORAL | Status: DC

## 2012-05-12 MED ORDER — LEVOFLOXACIN 750 MG PO TABS: 750.0000 mg | ORAL_TABLET | Freq: Every day | ORAL | Status: AC

## 2012-05-12 MED ORDER — ASPIRIN 81 MG PO TBEC: 81.0000 mg | DELAYED_RELEASE_TABLET | Freq: Every day | ORAL | Status: DC

## 2012-05-12 MED ORDER — LEVOFLOXACIN 750 MG PO TABS: 750.0000 mg | ORAL_TABLET | Freq: Every day | ORAL | Status: DC

## 2012-05-12 MED ORDER — ASPIRIN 81 MG PO TBEC: 81.0000 mg | DELAYED_RELEASE_TABLET | Freq: Every day | ORAL | Status: AC

## 2012-05-12 NOTE — Progress Notes (Signed)
Contacted Dr. Blake Divine.  Pt stated that he has been off of oxygen for the past two years.  He has been off of oxygen today and has had O2 sats greater than 95%.  He dropped below 90% one time.  Dr. Blake Divine said to ambulate patient in the hallway and monitor O2 sats while on room air.  His O2 sats were greater than 95% the entire time with one episode of dropping below 90%; however, it quickly returned to 98% on room air.  Dr. Blake Divine notified of results.  Fara Chute, RN 05/12/2012.

## 2012-05-12 NOTE — Discharge Summary (Signed)
Physician Discharge Summary  [name] [name] ZOX:[id number] DOB: 09-May-1961 DOA: 05/10/2012  PCP: Kirk Ruths, MD  Admit date: 05/10/2012 Discharge date: 05/12/2012  Recommendations for Outpatient Follow-up:  1. Follow up with PCP in 2 weeks. Recommend checking blood pressure once a day and follow it up with PCP .   Discharge Diagnoses:  Active Problems:  Angina at rest  HTN (hypertension)  Chronic pain  Diabetes mellitus COPD Atypical chest pain Bronchitis     Discharge Condition: stable  Diet recommendation: low salt diet   Filed Weights   05/10/12 1015 05/10/12 1403  Weight: 83.915 kg (185 lb) 82.827 kg (182 lb 9.6 oz)    History of present illness:  [name] [name] is a 51 y.o. male who has pMHx of HTN, DM, COPD, former smoker. Who had episode of chest pain on Sunday AM, "like an elephant sitting on his chest". This was accompanied by weakness and numbness on left side (arm and leg). This episode went away within 90 minutes. He then developed another episode on Tuesday that want away when he took his father's nitro. He came to the ER after seeing his PCP who was concerned about his symptoms. Has had a head ache for 1 week and associated nausea with the chest pain. He was admitted for evaluation of the chest pain and TIA like symptoms.   Hospital Course:  1.  Atypical chest pain: his cardiac enzymes have been negative, EKG does not show any ischemic changes. He underwent a myoview stress test yesterday, showed no definite ischemic defects. Echo showed mild diastolic dysfunction. LVEF is 58%. He is currently chest pain free.  2. Chronic neck pain- resume home medications- takes PRN 3. HTN- was not able to tolerate BB due to bradycardia,. Hence increased his dose of lisinopril to twice daily.  4. DM- HgbA1c: 5.7 , not on any medications at home.  5. ?TIA- CT scan of his head did not show any stoke (symptoms on Sunday morning). Hs symptoms have resolved. He will be  discharged on aspirin 81 mg for secondary prevention of stroke. 6. COPD- not on home O2, not having wheezing although he did have have SOB with exertion- coughing up green sputum which has improved. Started him on Levaquin for bronchitis and will complete the course of 7 days of antibiotics.       Procedures:  Myoview test.   Consultations:  cardiology  Discharge Exam: Filed Vitals:   05/12/12 0926  BP: 114/76  Pulse: 69  Temp:   Resp:    Filed Vitals:   05/11/12 2111 05/12/12 0542 05/12/12 0804 05/12/12 0926  BP: 102/68 91/58  114/76  Pulse: 70 67  69  Temp: 98 F (36.7 C) 98 F (36.7 C)    TempSrc: Oral Oral    Resp: 20 20    Height:      Weight:      SpO2: 95% 98% 98% 95%   General: A+Ox3, NAD  Cardiovascular: s1s2 normal, RRR, no MRG Respiratory: CTAB, no wheezing or rhonchi. Abdomen: +BS, soft, NT/ND Extremities: no pedal edema.   Discharge Instructions  Discharge Orders    Future Orders Please Complete By Expires   Diet - low sodium heart healthy      Discharge instructions      Comments:   Follow up with PCP as recommended. Check BP ONCE every morning and note it, to show it to PCP at the office visit.   Activity as tolerated - No restrictions  Medication List  As of 05/12/2012  9:55 AM   TAKE these medications         albuterol 108 (90 BASE) MCG/ACT inhaler   Commonly known as: PROVENTIL HFA;VENTOLIN HFA   Inhale 2 puffs into the lungs every 6 (six) hours as needed. Shortness of breath      alprazolam 2 MG tablet   Commonly known as: XANAX   Take 2 mg by mouth at bedtime as needed. sleep      aspirin 81 MG EC tablet   Take 1 tablet (81 mg total) by mouth daily.      carbamazepine 200 MG tablet   Commonly known as: TEGRETOL   Take 200 mg by mouth 2 (two) times daily.      CINNAMON PO   Take 1 capsule by mouth daily.      levofloxacin 750 MG tablet   Commonly known as: LEVAQUIN   Take 1 tablet (750 mg total) by mouth daily.       lisinopril 20 MG tablet   Commonly known as: PRINIVIL,ZESTRIL   Take 1 tablet (20 mg total) by mouth 2 (two) times daily.      naproxen 500 MG tablet   Commonly known as: NAPROSYN   Take 500 mg by mouth 2 (two) times daily with a meal.      oxyCODONE 15 MG immediate release tablet   Commonly known as: ROXICODONE   Take 15 mg by mouth 4 (four) times daily as needed. Pain      oxyCODONE 40 MG 12 hr tablet   Commonly known as: OXYCONTIN   Take 40 mg by mouth 3 (three) times daily as needed. Pain      predniSONE 5 MG tablet   Commonly known as: DELTASONE   Take 5 mg by mouth daily.      simvastatin 20 MG tablet   Commonly known as: ZOCOR   Take 1 tablet (20 mg total) by mouth daily at 6 PM.              The results of significant diagnostics from this hospitalization (including imaging, microbiology, ancillary and laboratory) are listed below for reference.    Significant Diagnostic Studies: Ct Head Wo Contrast  05/10/2012  *RADIOLOGY REPORT*  Clinical Data:  Severe hypertension.  Diabetes  CT HEAD WITHOUT CONTRAST  Technique:  Contiguous axial images were obtained from the base of the skull through the vertex without contrast  Comparison:  None.  Findings:  The brain has a normal appearance without evidence for hemorrhage, acute infarction, hydrocephalus, or mass lesion.  There is no extra axial fluid collection.  The skull and paranasal sinuses are normal.  IMPRESSION: Normal CT of the head without contrast.   Original Report Authenticated By: Camelia Phenes, M.D.    Nm Myocar Single W/spect W/wall Motion And Ef  05/11/2012  Ordering Physician: Joni Reining  Reading Physician: Jonelle Sidle  Clinical Data: 51 year old male with history of hypertension, COPD, diabetes, cervical spine disease now admitted with recent chest pain and arm numbness.  He has ruled out for myocardial infarction and is referred for the assessment of ischemia.  NUCLEAR MEDICINE STRESS MYOVIEW STUDY  WITH SPECT AND LEFT VENTRICULAR EJECTION FRACTION  Radionuclide Data: One-day rest/stress protocol performed with 10/30 mCi of Tc-44m Myoview.  Stress Data: Lexiscan bolus was given per protocol.  Heart rate increased from 61 beats per minute up to 94 beats per minute and blood pressure increased from 101/71 up to  125/62.  No diagnostic ST-segment changes were noted and there were no arrhythmias.  EKG: Baseline tracing shows sinus rhythm at 63 beats per minute with probable early repolarization.  Decreased R-wave progression is also noted.  Scintigraphic Data: Analysis of the raw perfusion data finds diaphragmatic attenuation mainly affecting the rest imaging with gut uptake also noted at stress imaging.  Tomographic views obtained using the short axis, vertical long axis, and horizontal long axis planes.  There is a mild intensity inferior basal defect noted that is most prominent at rest and consistent with attenuation.  No clear evidence of focal ischemia is noted.  Gated imaging reveals an EDV of 79, ESV of 33, T I D ratio 1.04, and LVEF of 58%.  IMPRESSION: Low risk Lexiscan Myoview as outlined.  There were no diagnostic ST- segment changes or arrhythmias.  Perfusion imaging shows evidence of diaphragmatic attenuation affecting the inferior wall, no definite ischemic defects.  LVEF is normal at 58%.   Original Report Authenticated By: Belinda Fisher    Dg Chest Portable 1 View  05/10/2012  *RADIOLOGY REPORT*  Clinical Data: 5-day history of intermittent chest pain.  History of hypertension and COPD.  PORTABLE CHEST - 1 VIEW 05/10/2012 1027 hours:  Comparison: Portable chest x-ray 03/07/2011 [facility]. Two-view chest x-ray 11/12/2009 [facility].  Findings: Cardiomediastinal silhouette unremarkable, unchanged. Lungs clear.  Bronchovascular markings normal.  Pulmonary vascularity normal.  No pneumothorax.  No pleural effusions.  IMPRESSION: No acute cardiopulmonary disease.   Original Report  Authenticated By: Arnell Sieving, M.D.     Microbiology: No results found for this or any previous visit (from the past 240 hour(s)).   Labs: Basic Metabolic Panel:  Lab 05/11/12 4540 05/10/12 1021  NA 137 136  K 4.1 4.1  CL 103 104  CO2 29 22  GLUCOSE 110* 135*  BUN 20 15  CREATININE 1.14 0.88  CALCIUM 9.7 9.5  MG -- --  PHOS -- --   Liver Function Tests: No results found for this basename: AST:5,ALT:5,ALKPHOS:5,BILITOT:5,PROT:5,ALBUMIN:5 in the last 168 hours No results found for this basename: LIPASE:5,AMYLASE:5 in the last 168 hours No results found for this basename: AMMONIA:5 in the last 168 hours CBC:  Lab 05/11/12 0211 05/10/12 1021  WBC 7.4 7.0  NEUTROABS -- 4.4  HGB 15.1 16.9  HCT 43.1 48.2  MCV 90.5 89.3  PLT 199 208   Cardiac Enzymes:  Lab 05/11/12 0210 05/10/12 1956 05/10/12 1411 05/10/12 1021  CKTOTAL -- -- -- --  CKMB -- -- -- --  CKMBINDEX -- -- -- --  TROPONINI <0.30 <0.30 <0.30 <0.30   BNP: BNP (last 3 results) No results found for this basename: PROBNP:3 in the last 8760 hours CBG:  Lab 05/12/12 0722 05/11/12 2109 05/11/12 1639 05/11/12 1135 05/11/12 0756  GLUCAP 106* 125* 101* 96 96    Time coordinating discharge: 45 minutes  Signed:  Shanda Cadotte  [facility] 05/12/2012, 9:55 AM

## 2012-05-12 NOTE — Progress Notes (Signed)
Pt and pt's spouse provided with discharge instructions and four prescriptions for discharge.  Pt and spouse verbalized understanding of d/c instructions.  Pt stable at time of d/c.  Transported by NT via wheelchair to main entrance for d/c.

## 2012-06-01 DIAGNOSIS — F411 Generalized anxiety disorder: Secondary | ICD-10-CM | POA: Diagnosis not present

## 2012-06-01 DIAGNOSIS — G8929 Other chronic pain: Secondary | ICD-10-CM | POA: Diagnosis not present

## 2012-07-12 DIAGNOSIS — M47817 Spondylosis without myelopathy or radiculopathy, lumbosacral region: Secondary | ICD-10-CM | POA: Diagnosis not present

## 2012-07-12 DIAGNOSIS — M47812 Spondylosis without myelopathy or radiculopathy, cervical region: Secondary | ICD-10-CM | POA: Diagnosis not present

## 2012-07-12 DIAGNOSIS — M5126 Other intervertebral disc displacement, lumbar region: Secondary | ICD-10-CM | POA: Diagnosis not present

## 2012-08-02 DIAGNOSIS — E119 Type 2 diabetes mellitus without complications: Secondary | ICD-10-CM | POA: Diagnosis not present

## 2012-08-02 DIAGNOSIS — Z125 Encounter for screening for malignant neoplasm of prostate: Secondary | ICD-10-CM | POA: Diagnosis not present

## 2012-08-02 DIAGNOSIS — Z0000 Encounter for general adult medical examination without abnormal findings: Secondary | ICD-10-CM | POA: Diagnosis not present

## 2012-08-02 DIAGNOSIS — Z6827 Body mass index (BMI) 27.0-27.9, adult: Secondary | ICD-10-CM | POA: Diagnosis not present

## 2012-08-02 DIAGNOSIS — Z23 Encounter for immunization: Secondary | ICD-10-CM | POA: Diagnosis not present

## 2012-08-30 DIAGNOSIS — Z961 Presence of intraocular lens: Secondary | ICD-10-CM | POA: Diagnosis not present

## 2012-09-03 DIAGNOSIS — Z6828 Body mass index (BMI) 28.0-28.9, adult: Secondary | ICD-10-CM | POA: Diagnosis not present

## 2012-09-03 DIAGNOSIS — G8929 Other chronic pain: Secondary | ICD-10-CM | POA: Diagnosis not present

## 2012-09-03 DIAGNOSIS — I10 Essential (primary) hypertension: Secondary | ICD-10-CM | POA: Diagnosis not present

## 2012-10-15 DIAGNOSIS — M47812 Spondylosis without myelopathy or radiculopathy, cervical region: Secondary | ICD-10-CM | POA: Diagnosis not present

## 2012-11-28 DIAGNOSIS — E119 Type 2 diabetes mellitus without complications: Secondary | ICD-10-CM | POA: Diagnosis not present

## 2012-11-28 DIAGNOSIS — H524 Presbyopia: Secondary | ICD-10-CM | POA: Diagnosis not present

## 2012-11-30 DIAGNOSIS — I10 Essential (primary) hypertension: Secondary | ICD-10-CM | POA: Diagnosis not present

## 2012-11-30 DIAGNOSIS — G8929 Other chronic pain: Secondary | ICD-10-CM | POA: Diagnosis not present

## 2012-11-30 DIAGNOSIS — F411 Generalized anxiety disorder: Secondary | ICD-10-CM | POA: Diagnosis not present

## 2012-11-30 DIAGNOSIS — J438 Other emphysema: Secondary | ICD-10-CM | POA: Diagnosis not present

## 2013-02-14 DIAGNOSIS — M47817 Spondylosis without myelopathy or radiculopathy, lumbosacral region: Secondary | ICD-10-CM | POA: Diagnosis not present

## 2013-02-14 DIAGNOSIS — M47812 Spondylosis without myelopathy or radiculopathy, cervical region: Secondary | ICD-10-CM | POA: Diagnosis not present

## 2013-02-14 DIAGNOSIS — M5126 Other intervertebral disc displacement, lumbar region: Secondary | ICD-10-CM | POA: Diagnosis not present

## 2013-03-01 DIAGNOSIS — Z681 Body mass index (BMI) 19 or less, adult: Secondary | ICD-10-CM | POA: Diagnosis not present

## 2013-03-01 DIAGNOSIS — E1129 Type 2 diabetes mellitus with other diabetic kidney complication: Secondary | ICD-10-CM | POA: Diagnosis not present

## 2013-03-01 DIAGNOSIS — F411 Generalized anxiety disorder: Secondary | ICD-10-CM | POA: Diagnosis not present

## 2013-03-01 DIAGNOSIS — N182 Chronic kidney disease, stage 2 (mild): Secondary | ICD-10-CM | POA: Diagnosis not present

## 2013-03-01 DIAGNOSIS — I10 Essential (primary) hypertension: Secondary | ICD-10-CM | POA: Diagnosis not present

## 2013-06-03 DIAGNOSIS — Z23 Encounter for immunization: Secondary | ICD-10-CM | POA: Diagnosis not present

## 2013-06-03 DIAGNOSIS — I10 Essential (primary) hypertension: Secondary | ICD-10-CM | POA: Diagnosis not present

## 2013-06-03 DIAGNOSIS — Z6827 Body mass index (BMI) 27.0-27.9, adult: Secondary | ICD-10-CM | POA: Diagnosis not present

## 2013-06-03 DIAGNOSIS — G8929 Other chronic pain: Secondary | ICD-10-CM | POA: Diagnosis not present

## 2013-06-03 DIAGNOSIS — F411 Generalized anxiety disorder: Secondary | ICD-10-CM | POA: Diagnosis not present

## 2013-06-13 DIAGNOSIS — M5126 Other intervertebral disc displacement, lumbar region: Secondary | ICD-10-CM | POA: Diagnosis not present

## 2013-06-13 DIAGNOSIS — M47812 Spondylosis without myelopathy or radiculopathy, cervical region: Secondary | ICD-10-CM | POA: Diagnosis not present

## 2013-06-13 DIAGNOSIS — M47817 Spondylosis without myelopathy or radiculopathy, lumbosacral region: Secondary | ICD-10-CM | POA: Diagnosis not present

## 2013-06-13 DIAGNOSIS — G500 Trigeminal neuralgia: Secondary | ICD-10-CM | POA: Diagnosis not present

## 2013-06-14 DIAGNOSIS — J320 Chronic maxillary sinusitis: Secondary | ICD-10-CM | POA: Diagnosis not present

## 2013-06-14 DIAGNOSIS — J322 Chronic ethmoidal sinusitis: Secondary | ICD-10-CM | POA: Diagnosis not present

## 2013-06-14 DIAGNOSIS — J342 Deviated nasal septum: Secondary | ICD-10-CM | POA: Diagnosis not present

## 2013-06-14 DIAGNOSIS — J3489 Other specified disorders of nose and nasal sinuses: Secondary | ICD-10-CM | POA: Diagnosis not present

## 2013-06-14 DIAGNOSIS — R209 Unspecified disturbances of skin sensation: Secondary | ICD-10-CM | POA: Diagnosis not present

## 2013-07-04 DIAGNOSIS — G500 Trigeminal neuralgia: Secondary | ICD-10-CM | POA: Diagnosis not present

## 2013-07-04 DIAGNOSIS — M47812 Spondylosis without myelopathy or radiculopathy, cervical region: Secondary | ICD-10-CM | POA: Diagnosis not present

## 2013-08-06 DIAGNOSIS — M5126 Other intervertebral disc displacement, lumbar region: Secondary | ICD-10-CM | POA: Diagnosis not present

## 2013-08-23 DIAGNOSIS — Z6828 Body mass index (BMI) 28.0-28.9, adult: Secondary | ICD-10-CM | POA: Diagnosis not present

## 2013-08-23 DIAGNOSIS — K149 Disease of tongue, unspecified: Secondary | ICD-10-CM | POA: Diagnosis not present

## 2013-09-02 DIAGNOSIS — E785 Hyperlipidemia, unspecified: Secondary | ICD-10-CM | POA: Diagnosis not present

## 2013-09-02 DIAGNOSIS — Z79899 Other long term (current) drug therapy: Secondary | ICD-10-CM | POA: Diagnosis not present

## 2013-09-02 DIAGNOSIS — E1129 Type 2 diabetes mellitus with other diabetic kidney complication: Secondary | ICD-10-CM | POA: Diagnosis not present

## 2013-09-02 DIAGNOSIS — Z125 Encounter for screening for malignant neoplasm of prostate: Secondary | ICD-10-CM | POA: Diagnosis not present

## 2013-09-02 DIAGNOSIS — G8929 Other chronic pain: Secondary | ICD-10-CM | POA: Diagnosis not present

## 2013-09-02 DIAGNOSIS — Z6828 Body mass index (BMI) 28.0-28.9, adult: Secondary | ICD-10-CM | POA: Diagnosis not present

## 2013-09-02 DIAGNOSIS — I10 Essential (primary) hypertension: Secondary | ICD-10-CM | POA: Diagnosis not present

## 2013-11-05 DIAGNOSIS — M5126 Other intervertebral disc displacement, lumbar region: Secondary | ICD-10-CM | POA: Diagnosis not present

## 2013-11-05 DIAGNOSIS — M47812 Spondylosis without myelopathy or radiculopathy, cervical region: Secondary | ICD-10-CM | POA: Diagnosis not present

## 2013-11-05 DIAGNOSIS — M47817 Spondylosis without myelopathy or radiculopathy, lumbosacral region: Secondary | ICD-10-CM | POA: Diagnosis not present

## 2013-11-27 DIAGNOSIS — F09 Unspecified mental disorder due to known physiological condition: Secondary | ICD-10-CM | POA: Diagnosis not present

## 2013-11-29 DIAGNOSIS — F411 Generalized anxiety disorder: Secondary | ICD-10-CM | POA: Diagnosis not present

## 2013-11-29 DIAGNOSIS — I10 Essential (primary) hypertension: Secondary | ICD-10-CM | POA: Diagnosis not present

## 2013-11-29 DIAGNOSIS — Z6828 Body mass index (BMI) 28.0-28.9, adult: Secondary | ICD-10-CM | POA: Diagnosis not present

## 2013-11-29 DIAGNOSIS — G8929 Other chronic pain: Secondary | ICD-10-CM | POA: Diagnosis not present

## 2013-12-17 DIAGNOSIS — F09 Unspecified mental disorder due to known physiological condition: Secondary | ICD-10-CM | POA: Diagnosis not present

## 2014-01-09 DIAGNOSIS — H60399 Other infective otitis externa, unspecified ear: Secondary | ICD-10-CM | POA: Diagnosis not present

## 2014-01-09 DIAGNOSIS — H9209 Otalgia, unspecified ear: Secondary | ICD-10-CM | POA: Diagnosis not present

## 2014-02-06 DIAGNOSIS — M5126 Other intervertebral disc displacement, lumbar region: Secondary | ICD-10-CM | POA: Diagnosis not present

## 2014-02-06 DIAGNOSIS — M47817 Spondylosis without myelopathy or radiculopathy, lumbosacral region: Secondary | ICD-10-CM | POA: Diagnosis not present

## 2014-02-06 DIAGNOSIS — M47812 Spondylosis without myelopathy or radiculopathy, cervical region: Secondary | ICD-10-CM | POA: Diagnosis not present

## 2014-02-07 DIAGNOSIS — M47817 Spondylosis without myelopathy or radiculopathy, lumbosacral region: Secondary | ICD-10-CM | POA: Diagnosis not present

## 2014-02-07 DIAGNOSIS — M5126 Other intervertebral disc displacement, lumbar region: Secondary | ICD-10-CM | POA: Diagnosis not present

## 2014-02-07 DIAGNOSIS — M48061 Spinal stenosis, lumbar region without neurogenic claudication: Secondary | ICD-10-CM | POA: Diagnosis not present

## 2014-02-07 DIAGNOSIS — M5137 Other intervertebral disc degeneration, lumbosacral region: Secondary | ICD-10-CM | POA: Diagnosis not present

## 2014-02-07 DIAGNOSIS — M519 Unspecified thoracic, thoracolumbar and lumbosacral intervertebral disc disorder: Secondary | ICD-10-CM | POA: Diagnosis not present

## 2014-02-27 DIAGNOSIS — E119 Type 2 diabetes mellitus without complications: Secondary | ICD-10-CM | POA: Diagnosis not present

## 2014-02-27 DIAGNOSIS — E785 Hyperlipidemia, unspecified: Secondary | ICD-10-CM | POA: Diagnosis not present

## 2014-02-27 DIAGNOSIS — J449 Chronic obstructive pulmonary disease, unspecified: Secondary | ICD-10-CM | POA: Diagnosis not present

## 2014-02-27 DIAGNOSIS — I10 Essential (primary) hypertension: Secondary | ICD-10-CM | POA: Diagnosis not present

## 2014-02-27 DIAGNOSIS — Z0000 Encounter for general adult medical examination without abnormal findings: Secondary | ICD-10-CM | POA: Diagnosis not present

## 2014-03-10 DIAGNOSIS — M47817 Spondylosis without myelopathy or radiculopathy, lumbosacral region: Secondary | ICD-10-CM | POA: Diagnosis not present

## 2014-03-10 DIAGNOSIS — M5126 Other intervertebral disc displacement, lumbar region: Secondary | ICD-10-CM | POA: Diagnosis not present

## 2014-03-10 DIAGNOSIS — M47812 Spondylosis without myelopathy or radiculopathy, cervical region: Secondary | ICD-10-CM | POA: Diagnosis not present

## 2014-03-24 DIAGNOSIS — M47817 Spondylosis without myelopathy or radiculopathy, lumbosacral region: Secondary | ICD-10-CM | POA: Diagnosis not present

## 2014-03-24 DIAGNOSIS — M5137 Other intervertebral disc degeneration, lumbosacral region: Secondary | ICD-10-CM | POA: Diagnosis not present

## 2014-03-24 DIAGNOSIS — M47812 Spondylosis without myelopathy or radiculopathy, cervical region: Secondary | ICD-10-CM | POA: Diagnosis not present

## 2014-03-28 DIAGNOSIS — F17200 Nicotine dependence, unspecified, uncomplicated: Secondary | ICD-10-CM | POA: Diagnosis not present

## 2014-03-28 DIAGNOSIS — J449 Chronic obstructive pulmonary disease, unspecified: Secondary | ICD-10-CM | POA: Diagnosis not present

## 2014-03-28 DIAGNOSIS — Z981 Arthrodesis status: Secondary | ICD-10-CM | POA: Diagnosis not present

## 2014-03-28 DIAGNOSIS — R569 Unspecified convulsions: Secondary | ICD-10-CM | POA: Diagnosis not present

## 2014-03-28 DIAGNOSIS — Z79899 Other long term (current) drug therapy: Secondary | ICD-10-CM | POA: Diagnosis not present

## 2014-03-28 DIAGNOSIS — I10 Essential (primary) hypertension: Secondary | ICD-10-CM | POA: Diagnosis not present

## 2014-03-28 DIAGNOSIS — M47817 Spondylosis without myelopathy or radiculopathy, lumbosacral region: Secondary | ICD-10-CM | POA: Diagnosis not present

## 2014-03-28 DIAGNOSIS — F411 Generalized anxiety disorder: Secondary | ICD-10-CM | POA: Diagnosis not present

## 2014-03-28 DIAGNOSIS — K219 Gastro-esophageal reflux disease without esophagitis: Secondary | ICD-10-CM | POA: Diagnosis not present

## 2014-03-28 DIAGNOSIS — M5137 Other intervertebral disc degeneration, lumbosacral region: Secondary | ICD-10-CM | POA: Diagnosis not present

## 2014-03-28 DIAGNOSIS — M47812 Spondylosis without myelopathy or radiculopathy, cervical region: Secondary | ICD-10-CM | POA: Diagnosis not present

## 2014-05-08 DIAGNOSIS — M76899 Other specified enthesopathies of unspecified lower limb, excluding foot: Secondary | ICD-10-CM | POA: Diagnosis not present

## 2014-05-08 DIAGNOSIS — M47817 Spondylosis without myelopathy or radiculopathy, lumbosacral region: Secondary | ICD-10-CM | POA: Diagnosis not present

## 2014-05-08 DIAGNOSIS — M47812 Spondylosis without myelopathy or radiculopathy, cervical region: Secondary | ICD-10-CM | POA: Diagnosis not present

## 2014-05-08 DIAGNOSIS — M5137 Other intervertebral disc degeneration, lumbosacral region: Secondary | ICD-10-CM | POA: Diagnosis not present

## 2014-05-15 DIAGNOSIS — Z79899 Other long term (current) drug therapy: Secondary | ICD-10-CM | POA: Diagnosis not present

## 2014-05-15 DIAGNOSIS — R569 Unspecified convulsions: Secondary | ICD-10-CM | POA: Diagnosis not present

## 2014-05-15 DIAGNOSIS — K219 Gastro-esophageal reflux disease without esophagitis: Secondary | ICD-10-CM | POA: Diagnosis not present

## 2014-05-15 DIAGNOSIS — IMO0002 Reserved for concepts with insufficient information to code with codable children: Secondary | ICD-10-CM | POA: Diagnosis not present

## 2014-05-15 DIAGNOSIS — M76899 Other specified enthesopathies of unspecified lower limb, excluding foot: Secondary | ICD-10-CM | POA: Diagnosis not present

## 2014-05-15 DIAGNOSIS — J449 Chronic obstructive pulmonary disease, unspecified: Secondary | ICD-10-CM | POA: Diagnosis not present

## 2014-05-15 DIAGNOSIS — M5137 Other intervertebral disc degeneration, lumbosacral region: Secondary | ICD-10-CM | POA: Diagnosis not present

## 2014-05-15 DIAGNOSIS — I10 Essential (primary) hypertension: Secondary | ICD-10-CM | POA: Diagnosis not present

## 2014-05-15 DIAGNOSIS — M47812 Spondylosis without myelopathy or radiculopathy, cervical region: Secondary | ICD-10-CM | POA: Diagnosis not present

## 2014-05-15 DIAGNOSIS — F411 Generalized anxiety disorder: Secondary | ICD-10-CM | POA: Diagnosis not present

## 2014-05-15 DIAGNOSIS — M47817 Spondylosis without myelopathy or radiculopathy, lumbosacral region: Secondary | ICD-10-CM | POA: Diagnosis not present

## 2014-05-15 DIAGNOSIS — Z981 Arthrodesis status: Secondary | ICD-10-CM | POA: Diagnosis not present

## 2014-05-15 DIAGNOSIS — F17200 Nicotine dependence, unspecified, uncomplicated: Secondary | ICD-10-CM | POA: Diagnosis not present

## 2014-05-30 DIAGNOSIS — M26609 Unspecified temporomandibular joint disorder, unspecified side: Secondary | ICD-10-CM | POA: Diagnosis not present

## 2014-05-30 DIAGNOSIS — Z6827 Body mass index (BMI) 27.0-27.9, adult: Secondary | ICD-10-CM | POA: Diagnosis not present

## 2014-05-30 DIAGNOSIS — I10 Essential (primary) hypertension: Secondary | ICD-10-CM | POA: Diagnosis not present

## 2014-05-30 DIAGNOSIS — J449 Chronic obstructive pulmonary disease, unspecified: Secondary | ICD-10-CM | POA: Diagnosis not present

## 2014-05-30 DIAGNOSIS — E119 Type 2 diabetes mellitus without complications: Secondary | ICD-10-CM | POA: Diagnosis not present

## 2014-05-30 DIAGNOSIS — F411 Generalized anxiety disorder: Secondary | ICD-10-CM | POA: Diagnosis not present

## 2014-05-30 DIAGNOSIS — E785 Hyperlipidemia, unspecified: Secondary | ICD-10-CM | POA: Diagnosis not present

## 2014-05-30 DIAGNOSIS — G8929 Other chronic pain: Secondary | ICD-10-CM | POA: Diagnosis not present

## 2014-06-25 DIAGNOSIS — M7062 Trochanteric bursitis, left hip: Secondary | ICD-10-CM | POA: Diagnosis not present

## 2014-06-25 DIAGNOSIS — M47816 Spondylosis without myelopathy or radiculopathy, lumbar region: Secondary | ICD-10-CM | POA: Diagnosis not present

## 2014-06-25 DIAGNOSIS — M5136 Other intervertebral disc degeneration, lumbar region: Secondary | ICD-10-CM | POA: Diagnosis not present

## 2014-06-25 DIAGNOSIS — M47812 Spondylosis without myelopathy or radiculopathy, cervical region: Secondary | ICD-10-CM | POA: Diagnosis not present

## 2014-07-02 DIAGNOSIS — G500 Trigeminal neuralgia: Secondary | ICD-10-CM | POA: Diagnosis not present

## 2014-07-02 DIAGNOSIS — M47816 Spondylosis without myelopathy or radiculopathy, lumbar region: Secondary | ICD-10-CM | POA: Diagnosis not present

## 2014-07-02 DIAGNOSIS — M5126 Other intervertebral disc displacement, lumbar region: Secondary | ICD-10-CM | POA: Diagnosis not present

## 2014-07-02 DIAGNOSIS — M4302 Spondylolysis, cervical region: Secondary | ICD-10-CM | POA: Diagnosis not present

## 2014-08-21 DIAGNOSIS — E1129 Type 2 diabetes mellitus with other diabetic kidney complication: Secondary | ICD-10-CM | POA: Diagnosis not present

## 2014-08-21 DIAGNOSIS — Z6827 Body mass index (BMI) 27.0-27.9, adult: Secondary | ICD-10-CM | POA: Diagnosis not present

## 2014-08-21 DIAGNOSIS — H6091 Unspecified otitis externa, right ear: Secondary | ICD-10-CM | POA: Diagnosis not present

## 2014-08-21 DIAGNOSIS — Z23 Encounter for immunization: Secondary | ICD-10-CM | POA: Diagnosis not present

## 2014-08-21 DIAGNOSIS — G894 Chronic pain syndrome: Secondary | ICD-10-CM | POA: Diagnosis not present

## 2014-10-01 DIAGNOSIS — E663 Overweight: Secondary | ICD-10-CM | POA: Diagnosis not present

## 2014-10-01 DIAGNOSIS — Z6828 Body mass index (BMI) 28.0-28.9, adult: Secondary | ICD-10-CM | POA: Diagnosis not present

## 2014-10-01 DIAGNOSIS — G894 Chronic pain syndrome: Secondary | ICD-10-CM | POA: Diagnosis not present

## 2014-10-23 DIAGNOSIS — M7732 Calcaneal spur, left foot: Secondary | ICD-10-CM | POA: Diagnosis not present

## 2014-12-16 DIAGNOSIS — M5126 Other intervertebral disc displacement, lumbar region: Secondary | ICD-10-CM | POA: Diagnosis not present

## 2014-12-26 DIAGNOSIS — M5127 Other intervertebral disc displacement, lumbosacral region: Secondary | ICD-10-CM | POA: Diagnosis not present

## 2014-12-26 DIAGNOSIS — M5126 Other intervertebral disc displacement, lumbar region: Secondary | ICD-10-CM | POA: Diagnosis not present

## 2014-12-26 DIAGNOSIS — M47816 Spondylosis without myelopathy or radiculopathy, lumbar region: Secondary | ICD-10-CM | POA: Diagnosis not present

## 2014-12-26 DIAGNOSIS — M4806 Spinal stenosis, lumbar region: Secondary | ICD-10-CM | POA: Diagnosis not present

## 2015-01-15 DIAGNOSIS — M5126 Other intervertebral disc displacement, lumbar region: Secondary | ICD-10-CM | POA: Diagnosis not present

## 2015-01-15 DIAGNOSIS — M47816 Spondylosis without myelopathy or radiculopathy, lumbar region: Secondary | ICD-10-CM | POA: Diagnosis not present

## 2015-01-15 DIAGNOSIS — M4302 Spondylolysis, cervical region: Secondary | ICD-10-CM | POA: Diagnosis not present

## 2015-01-22 DIAGNOSIS — F419 Anxiety disorder, unspecified: Secondary | ICD-10-CM | POA: Diagnosis not present

## 2015-01-22 DIAGNOSIS — E782 Mixed hyperlipidemia: Secondary | ICD-10-CM | POA: Diagnosis not present

## 2015-01-22 DIAGNOSIS — G894 Chronic pain syndrome: Secondary | ICD-10-CM | POA: Diagnosis not present

## 2015-01-22 DIAGNOSIS — I10 Essential (primary) hypertension: Secondary | ICD-10-CM | POA: Diagnosis not present

## 2015-02-16 DIAGNOSIS — Z981 Arthrodesis status: Secondary | ICD-10-CM | POA: Diagnosis not present

## 2015-02-16 DIAGNOSIS — J449 Chronic obstructive pulmonary disease, unspecified: Secondary | ICD-10-CM | POA: Diagnosis not present

## 2015-02-16 DIAGNOSIS — M5136 Other intervertebral disc degeneration, lumbar region: Secondary | ICD-10-CM | POA: Diagnosis not present

## 2015-02-16 DIAGNOSIS — M47816 Spondylosis without myelopathy or radiculopathy, lumbar region: Secondary | ICD-10-CM | POA: Diagnosis not present

## 2015-02-16 DIAGNOSIS — K219 Gastro-esophageal reflux disease without esophagitis: Secondary | ICD-10-CM | POA: Diagnosis not present

## 2015-02-16 DIAGNOSIS — R569 Unspecified convulsions: Secondary | ICD-10-CM | POA: Diagnosis not present

## 2015-02-16 DIAGNOSIS — M47812 Spondylosis without myelopathy or radiculopathy, cervical region: Secondary | ICD-10-CM | POA: Diagnosis not present

## 2015-02-16 DIAGNOSIS — G8929 Other chronic pain: Secondary | ICD-10-CM | POA: Diagnosis not present

## 2015-02-16 DIAGNOSIS — F419 Anxiety disorder, unspecified: Secondary | ICD-10-CM | POA: Diagnosis not present

## 2015-02-16 DIAGNOSIS — Z87891 Personal history of nicotine dependence: Secondary | ICD-10-CM | POA: Diagnosis not present

## 2015-02-16 DIAGNOSIS — I10 Essential (primary) hypertension: Secondary | ICD-10-CM | POA: Diagnosis not present

## 2015-02-16 DIAGNOSIS — M7062 Trochanteric bursitis, left hip: Secondary | ICD-10-CM | POA: Diagnosis not present

## 2015-02-16 DIAGNOSIS — Z79899 Other long term (current) drug therapy: Secondary | ICD-10-CM | POA: Diagnosis not present

## 2015-03-18 DIAGNOSIS — M47816 Spondylosis without myelopathy or radiculopathy, lumbar region: Secondary | ICD-10-CM | POA: Diagnosis not present

## 2015-03-18 DIAGNOSIS — M47812 Spondylosis without myelopathy or radiculopathy, cervical region: Secondary | ICD-10-CM | POA: Diagnosis not present

## 2015-03-18 DIAGNOSIS — M5126 Other intervertebral disc displacement, lumbar region: Secondary | ICD-10-CM | POA: Diagnosis not present

## 2015-03-18 DIAGNOSIS — M7062 Trochanteric bursitis, left hip: Secondary | ICD-10-CM | POA: Diagnosis not present

## 2015-03-18 DIAGNOSIS — M5136 Other intervertebral disc degeneration, lumbar region: Secondary | ICD-10-CM | POA: Diagnosis not present

## 2015-04-17 DIAGNOSIS — E663 Overweight: Secondary | ICD-10-CM | POA: Diagnosis not present

## 2015-04-17 DIAGNOSIS — F419 Anxiety disorder, unspecified: Secondary | ICD-10-CM | POA: Diagnosis not present

## 2015-04-17 DIAGNOSIS — J449 Chronic obstructive pulmonary disease, unspecified: Secondary | ICD-10-CM | POA: Diagnosis not present

## 2015-04-17 DIAGNOSIS — I10 Essential (primary) hypertension: Secondary | ICD-10-CM | POA: Diagnosis not present

## 2015-04-17 DIAGNOSIS — Z79899 Other long term (current) drug therapy: Secondary | ICD-10-CM | POA: Diagnosis not present

## 2015-04-17 DIAGNOSIS — E782 Mixed hyperlipidemia: Secondary | ICD-10-CM | POA: Diagnosis not present

## 2015-04-17 DIAGNOSIS — G894 Chronic pain syndrome: Secondary | ICD-10-CM | POA: Diagnosis not present

## 2015-04-17 DIAGNOSIS — Z1389 Encounter for screening for other disorder: Secondary | ICD-10-CM | POA: Diagnosis not present

## 2015-04-17 DIAGNOSIS — Z6828 Body mass index (BMI) 28.0-28.9, adult: Secondary | ICD-10-CM | POA: Diagnosis not present

## 2015-07-01 DIAGNOSIS — M47816 Spondylosis without myelopathy or radiculopathy, lumbar region: Secondary | ICD-10-CM | POA: Diagnosis not present

## 2015-07-01 DIAGNOSIS — M4302 Spondylolysis, cervical region: Secondary | ICD-10-CM | POA: Diagnosis not present

## 2015-07-16 DIAGNOSIS — H6690 Otitis media, unspecified, unspecified ear: Secondary | ICD-10-CM | POA: Diagnosis not present

## 2015-07-16 DIAGNOSIS — Z23 Encounter for immunization: Secondary | ICD-10-CM | POA: Diagnosis not present

## 2015-07-16 DIAGNOSIS — Z6828 Body mass index (BMI) 28.0-28.9, adult: Secondary | ICD-10-CM | POA: Diagnosis not present

## 2015-07-16 DIAGNOSIS — Z1389 Encounter for screening for other disorder: Secondary | ICD-10-CM | POA: Diagnosis not present

## 2015-07-16 DIAGNOSIS — F419 Anxiety disorder, unspecified: Secondary | ICD-10-CM | POA: Diagnosis not present

## 2015-07-16 DIAGNOSIS — G894 Chronic pain syndrome: Secondary | ICD-10-CM | POA: Diagnosis not present

## 2015-09-15 DIAGNOSIS — H66004 Acute suppurative otitis media without spontaneous rupture of ear drum, recurrent, right ear: Secondary | ICD-10-CM | POA: Diagnosis not present

## 2015-09-15 DIAGNOSIS — Z6828 Body mass index (BMI) 28.0-28.9, adult: Secondary | ICD-10-CM | POA: Diagnosis not present

## 2015-09-15 DIAGNOSIS — J069 Acute upper respiratory infection, unspecified: Secondary | ICD-10-CM | POA: Diagnosis not present

## 2015-09-15 DIAGNOSIS — H9201 Otalgia, right ear: Secondary | ICD-10-CM | POA: Diagnosis not present

## 2015-09-15 DIAGNOSIS — Z1389 Encounter for screening for other disorder: Secondary | ICD-10-CM | POA: Diagnosis not present

## 2015-09-15 DIAGNOSIS — E663 Overweight: Secondary | ICD-10-CM | POA: Diagnosis not present

## 2015-09-23 ENCOUNTER — Encounter (HOSPITAL_COMMUNITY): Payer: Self-pay

## 2015-09-23 ENCOUNTER — Emergency Department (HOSPITAL_COMMUNITY): Payer: Medicare Other

## 2015-09-23 ENCOUNTER — Emergency Department (HOSPITAL_COMMUNITY)
Admission: EM | Admit: 2015-09-23 | Discharge: 2015-09-23 | Disposition: A | Discharge status: Home / Self Care | Payer: Medicare Other | Attending: Emergency Medicine | Admitting: Emergency Medicine

## 2015-09-23 DIAGNOSIS — Y9389 Activity, other specified: Secondary | ICD-10-CM | POA: Insufficient documentation

## 2015-09-23 DIAGNOSIS — I10 Essential (primary) hypertension: Secondary | ICD-10-CM | POA: Diagnosis not present

## 2015-09-23 DIAGNOSIS — S63502A Unspecified sprain of left wrist, initial encounter: Secondary | ICD-10-CM | POA: Diagnosis not present

## 2015-09-23 DIAGNOSIS — W000XXA Fall on same level due to ice and snow, initial encounter: Secondary | ICD-10-CM | POA: Diagnosis not present

## 2015-09-23 DIAGNOSIS — Z79899 Other long term (current) drug therapy: Secondary | ICD-10-CM | POA: Diagnosis not present

## 2015-09-23 DIAGNOSIS — Y9289 Other specified places as the place of occurrence of the external cause: Secondary | ICD-10-CM | POA: Diagnosis not present

## 2015-09-23 DIAGNOSIS — Z8739 Personal history of other diseases of the musculoskeletal system and connective tissue: Secondary | ICD-10-CM | POA: Diagnosis not present

## 2015-09-23 DIAGNOSIS — M25532 Pain in left wrist: Secondary | ICD-10-CM | POA: Diagnosis not present

## 2015-09-23 DIAGNOSIS — J449 Chronic obstructive pulmonary disease, unspecified: Secondary | ICD-10-CM | POA: Diagnosis not present

## 2015-09-23 DIAGNOSIS — F419 Anxiety disorder, unspecified: Secondary | ICD-10-CM | POA: Insufficient documentation

## 2015-09-23 DIAGNOSIS — Y998 Other external cause status: Secondary | ICD-10-CM | POA: Diagnosis not present

## 2015-09-23 DIAGNOSIS — Z87891 Personal history of nicotine dependence: Secondary | ICD-10-CM | POA: Insufficient documentation

## 2015-09-23 DIAGNOSIS — Z7952 Long term (current) use of systemic steroids: Secondary | ICD-10-CM | POA: Diagnosis not present

## 2015-09-23 DIAGNOSIS — Z8719 Personal history of other diseases of the digestive system: Secondary | ICD-10-CM | POA: Insufficient documentation

## 2015-09-23 DIAGNOSIS — Z791 Long term (current) use of non-steroidal anti-inflammatories (NSAID): Secondary | ICD-10-CM | POA: Diagnosis not present

## 2015-09-23 DIAGNOSIS — S6992XA Unspecified injury of left wrist, hand and finger(s), initial encounter: Secondary | ICD-10-CM | POA: Diagnosis not present

## 2015-09-23 NOTE — Discharge Instructions (Signed)
Wrist Sprain A wrist sprain is a stretch or tear in the strong, fibrous tissues (ligaments) that connect your wrist bones. The ligaments of your wrist may be easily sprained. There are three types of wrist sprains.  Grade 1. The ligament is not stretched or torn, but the sprain causes pain.  Grade 2. The ligament is stretched or partially torn. You may be able to move your wrist, but not very much.  Grade 3. The ligament or muscle completely tears. You may find it difficult or extremely painful to move your wrist even a little. CAUSES Often, wrist sprains are a result of a fall or an injury. The force of the impact causes the fibers of your ligament to stretch too much or tear. Common causes of wrist sprains include:  Overextending your wrist while catching a ball with your hands.  Repetitive or strenuous extension or bending of your wrist.  Landing on your hand during a fall. RISK FACTORS  Having previous wrist injuries.  Playing contact sports, such as boxing or wrestling.  Participating in activities in which falling is common.  Having poor wrist strength and flexibility. SIGNS AND SYMPTOMS  Wrist pain.  Wrist tenderness.  Inflammation or bruising of the wrist area.  Hearing a "pop" or feeling a tear at the time of the injury.  Decreased wrist movement due to pain, stiffness, or weakness. DIAGNOSIS Your health care provider will examine your wrist. In some cases, an X-ray will be taken to make sure you did not break any bones. If your health care provider thinks that you tore a ligament, he or she may order an MRI of your wrist. TREATMENT Treatment involves resting and icing your wrist. You may also need to take pain medicines to help lessen pain and inflammation. Your health care provider may recommend keeping your wrist still (immobilized) with a splint to help your sprain heal. When the splint is no longer necessary, you may need to perform strengthening and stretching  exercises. These exercises help you to regain strength and full range of motion in your wrist. Surgery is not usually needed for wrist sprains unless the ligament completely tears. HOME CARE INSTRUCTIONS  Rest your wrist. Do not do things that cause pain.  Wear your wrist splint as directed by your health care provider.  Take medicines only as directed by your health care provider.  To ease pain and swelling, apply ice to the injured area.  Put ice in a plastic bag.  Place a towel between your skin and the bag.  Leave the ice on for 20 minutes, 2-3 times a day. SEEK MEDICAL CARE IF:  Your pain, discomfort, or swelling gets worse even with treatment.  You feel sudden numbness in your hand.   This information is not intended to replace advice given to you by your health care provider. Make sure you discuss any questions you have with your health care provider.   Document Released: 05/02/2014 Document Reviewed: 05/02/2014 Elsevier Interactive Patient Education [employer].

## 2015-09-23 NOTE — ED Notes (Signed)
[name] [name] at bedside.

## 2015-09-23 NOTE — ED Notes (Signed)
Pt reports slipped on ice yesterday and c/o pain to left wrist.  Pt can  Move fingers and make a fist.

## 2015-09-26 NOTE — ED Provider Notes (Signed)
CSN: [id number]     Arrival date & time 09/23/15  1312 History   First MD Initiated Contact with Patient 09/23/15 1333     Chief Complaint  Patient presents with  . Wrist Pain     (Consider location/radiation/quality/duration/timing/severity/associated sxs/prior Treatment) HPI   [name] [name] is a 55 y.o. male who presents to the Emergency Department complaining of left wrist pain.  He reports a mechanical fall on the ice, landing on his hand. He denies other injuries, but reports pain with movement of the wrist and with gripping.  He has not tried any medications for symptom relief.  He denies elbow pain, numbness or weakness of the left hand.     Past Medical History  Diagnosis Date  . COPD (chronic obstructive pulmonary disease) ([place])   . DDD (degenerative disc disease)   . HTN (hypertension)   . GERD (gastroesophageal reflux disease)   . Seizures ([place])     last seizure 7 yrs ago-unknown etiology  . Anxiety    Past Surgical History  Procedure Laterality Date  . Cholecystectomy    . Cervical disc surgery      X4  . Elbow surgery      right  . Colonoscopy  08/24/2011    Procedure: COLONOSCOPY;  Surgeon: Daneil Dolin, MD;  Location: [facility];  Service: Endoscopy;  Laterality: N/A;  7:30AM   Family History  Problem Relation Age of Onset  . Colon cancer Neg Hx   . Anesthesia problems Neg Hx   . Hypotension Neg Hx   . Malignant hyperthermia Neg Hx   . Pseudochol deficiency Neg Hx   . Hypertension Father   . Heart attack Father    Social History  Substance Use Topics  . Smoking status: Former Smoker -- 1.50 packs/day for 30 years    Types: Cigarettes    Quit date: 06/24/2011  . Smokeless tobacco: None     Comment: 1-3 cigarettes   . Alcohol Use: No    Review of Systems  Constitutional: Negative for fever and chills.  Musculoskeletal: Positive for arthralgias (left wrist pain). Negative for joint swelling.  Skin: Negative for color change and wound.   Neurological: Negative for weakness and numbness.  All other systems reviewed and are negative.     Allergies  Review of patient's allergies indicates no known allergies.  Home Medications   Prior to Admission medications   Medication Sig Start Date End Date Taking? Authorizing Provider  albuterol (PROVENTIL HFA;VENTOLIN HFA) 108 (90 BASE) MCG/ACT inhaler Inhale 2 puffs into the lungs every 6 (six) hours as needed. Shortness of breath    Yes Historical Provider, MD  alprazolam Duanne Moron) 2 MG tablet Take 2 mg by mouth at bedtime as needed. sleep   Yes Historical Provider, MD  amLODipine (NORVASC) 5 MG tablet Take 5 mg by mouth daily.   Yes Historical Provider, MD  carbamazepine (TEGRETOL) 200 MG tablet Take 200 mg by mouth 2 (two) times daily.    Yes Historical Provider, MD  lisinopril (PRINIVIL,ZESTRIL) 20 MG tablet Take 20 mg by mouth 2 (two) times daily.   Yes Historical Provider, MD  naproxen (NAPROSYN) 500 MG tablet Take 500 mg by mouth 2 (two) times daily with a meal.     Yes Historical Provider, MD  oxyCODONE (ROXICODONE) 15 MG immediate release tablet Take 15 mg by mouth 4 (four) times daily as needed. Pain   Yes Historical Provider, MD  predniSONE (DELTASONE) 5 MG tablet Take 5  mg by mouth daily.     Yes Historical Provider, MD   BP 192/94 mmHg  Pulse 82  Temp(Src) 98.9 F (37.2 C) (Oral)  Resp 18  Ht '5\' 6"'$  (1.676 m)  Wt 81.647 kg  BMI 29.07 kg/m2  SpO2 98% Physical Exam  Constitutional: He is oriented to person, place, and time. He appears well-developed and well-nourished.  HENT:  Head: Atraumatic.  Neck: Normal range of motion.  Cardiovascular: Normal rate, regular rhythm and intact distal pulses.   Pulmonary/Chest: Effort normal. No respiratory distress.  Musculoskeletal: He exhibits tenderness. He exhibits no edema.  ttp of the dorsal left wrist.  No erythema or bony deformity.  Radial pulse is brisk, distal sensation intact.  No proximal tenderness.  Compartments  are soft.   Neurological: He is alert and oriented to person, place, and time.  Skin: Skin is warm. No rash noted. No erythema.  Psychiatric: He has a normal mood and affect.  Nursing note and vitals reviewed.   ED Course  Procedures (including critical care time) Labs Review Labs Reviewed - No data to display  Imaging Review Dg Wrist Complete Left  09/23/2015  CLINICAL DATA:  Slip and fall yesterday with wrist pain, initial encounter EXAM: LEFT WRIST - COMPLETE 3+ VIEW COMPARISON:  None. FINDINGS: There is no evidence of fracture or dislocation. There is no evidence of arthropathy or other focal bone abnormality. Soft tissues are unremarkable. IMPRESSION: No acute abnormality noted. Electronically Signed   By: Inez Catalina M.D.   On: 09/23/2015 13:42    I have personally reviewed and evaluated these images and lab results as part of my medical decision-making.   EKG Interpretation None      MDM   Final diagnoses:  Sprain of wrist, left, initial encounter    Wrist splint applied, remains NV intact.  Likely sprain, but discussed possible ligamentous injury as well.  Pt agrees to symptomatic tx and orthopedic f/u if not improving.      Kem Parkinson, PA-C 09/26/15 1652  Milton Ferguson, MD 09/29/15 [phone]

## 2015-09-30 DIAGNOSIS — M4302 Spondylolysis, cervical region: Secondary | ICD-10-CM | POA: Diagnosis not present

## 2015-09-30 DIAGNOSIS — G500 Trigeminal neuralgia: Secondary | ICD-10-CM | POA: Diagnosis not present

## 2015-09-30 DIAGNOSIS — M7732 Calcaneal spur, left foot: Secondary | ICD-10-CM | POA: Diagnosis not present

## 2015-09-30 DIAGNOSIS — M25532 Pain in left wrist: Secondary | ICD-10-CM | POA: Diagnosis not present

## 2015-10-06 DIAGNOSIS — H9209 Otalgia, unspecified ear: Secondary | ICD-10-CM | POA: Diagnosis not present

## 2015-10-06 DIAGNOSIS — H6983 Other specified disorders of Eustachian tube, bilateral: Secondary | ICD-10-CM | POA: Diagnosis not present

## 2015-10-06 DIAGNOSIS — H903 Sensorineural hearing loss, bilateral: Secondary | ICD-10-CM | POA: Diagnosis not present

## 2015-10-16 DIAGNOSIS — Z6829 Body mass index (BMI) 29.0-29.9, adult: Secondary | ICD-10-CM | POA: Diagnosis not present

## 2015-10-16 DIAGNOSIS — E785 Hyperlipidemia, unspecified: Secondary | ICD-10-CM | POA: Diagnosis not present

## 2015-10-16 DIAGNOSIS — Z1389 Encounter for screening for other disorder: Secondary | ICD-10-CM | POA: Diagnosis not present

## 2015-10-16 DIAGNOSIS — R1900 Intra-abdominal and pelvic swelling, mass and lump, unspecified site: Secondary | ICD-10-CM | POA: Diagnosis not present

## 2015-10-16 DIAGNOSIS — I10 Essential (primary) hypertension: Secondary | ICD-10-CM | POA: Diagnosis not present

## 2015-10-16 DIAGNOSIS — N433 Hydrocele, unspecified: Secondary | ICD-10-CM | POA: Diagnosis not present

## 2015-10-16 DIAGNOSIS — Z79899 Other long term (current) drug therapy: Secondary | ICD-10-CM | POA: Diagnosis not present

## 2015-10-16 DIAGNOSIS — J449 Chronic obstructive pulmonary disease, unspecified: Secondary | ICD-10-CM | POA: Diagnosis not present

## 2015-10-16 DIAGNOSIS — H9201 Otalgia, right ear: Secondary | ICD-10-CM | POA: Diagnosis not present

## 2015-10-16 DIAGNOSIS — Z125 Encounter for screening for malignant neoplasm of prostate: Secondary | ICD-10-CM | POA: Diagnosis not present

## 2015-10-16 DIAGNOSIS — E119 Type 2 diabetes mellitus without complications: Secondary | ICD-10-CM | POA: Diagnosis not present

## 2015-10-16 DIAGNOSIS — N182 Chronic kidney disease, stage 2 (mild): Secondary | ICD-10-CM | POA: Diagnosis not present

## 2015-11-04 ENCOUNTER — Other Ambulatory Visit (HOSPITAL_COMMUNITY): Payer: Self-pay | Admitting: Internal Medicine

## 2015-11-04 DIAGNOSIS — IMO0002 Reserved for concepts with insufficient information to code with codable children: Secondary | ICD-10-CM

## 2015-11-04 DIAGNOSIS — R229 Localized swelling, mass and lump, unspecified: Principal | ICD-10-CM

## 2015-11-06 ENCOUNTER — Ambulatory Visit (HOSPITAL_COMMUNITY)
Admission: RE | Admit: 2015-11-06 | Discharge: 2015-11-06 | Disposition: A | Discharge status: Home / Self Care | Payer: Medicare Other | Source: Ambulatory Visit | Attending: Internal Medicine | Admitting: Internal Medicine

## 2015-11-06 DIAGNOSIS — N509 Disorder of male genital organs, unspecified: Secondary | ICD-10-CM | POA: Insufficient documentation

## 2015-11-06 DIAGNOSIS — R229 Localized swelling, mass and lump, unspecified: Secondary | ICD-10-CM

## 2015-11-06 DIAGNOSIS — N433 Hydrocele, unspecified: Secondary | ICD-10-CM | POA: Insufficient documentation

## 2015-11-06 DIAGNOSIS — IMO0002 Reserved for concepts with insufficient information to code with codable children: Secondary | ICD-10-CM

## 2015-12-07 DIAGNOSIS — N4281 Prostatodynia syndrome: Secondary | ICD-10-CM | POA: Diagnosis not present

## 2015-12-07 DIAGNOSIS — N451 Epididymitis: Secondary | ICD-10-CM | POA: Diagnosis not present

## 2015-12-07 DIAGNOSIS — R39198 Other difficulties with micturition: Secondary | ICD-10-CM | POA: Diagnosis not present

## 2015-12-08 DIAGNOSIS — N451 Epididymitis: Secondary | ICD-10-CM | POA: Diagnosis not present

## 2015-12-08 DIAGNOSIS — N4281 Prostatodynia syndrome: Secondary | ICD-10-CM | POA: Diagnosis not present

## 2015-12-30 DIAGNOSIS — M4302 Spondylolysis, cervical region: Secondary | ICD-10-CM | POA: Diagnosis not present

## 2015-12-30 DIAGNOSIS — M5126 Other intervertebral disc displacement, lumbar region: Secondary | ICD-10-CM | POA: Diagnosis not present

## 2015-12-30 DIAGNOSIS — M47816 Spondylosis without myelopathy or radiculopathy, lumbar region: Secondary | ICD-10-CM | POA: Diagnosis not present

## 2016-01-14 DIAGNOSIS — J9801 Acute bronchospasm: Secondary | ICD-10-CM | POA: Diagnosis not present

## 2016-01-14 DIAGNOSIS — N4281 Prostatodynia syndrome: Secondary | ICD-10-CM | POA: Diagnosis not present

## 2016-01-14 DIAGNOSIS — Z1389 Encounter for screening for other disorder: Secondary | ICD-10-CM | POA: Diagnosis not present

## 2016-01-14 DIAGNOSIS — N451 Epididymitis: Secondary | ICD-10-CM | POA: Diagnosis not present

## 2016-01-14 DIAGNOSIS — J449 Chronic obstructive pulmonary disease, unspecified: Secondary | ICD-10-CM | POA: Diagnosis not present

## 2016-01-14 DIAGNOSIS — I10 Essential (primary) hypertension: Secondary | ICD-10-CM | POA: Diagnosis not present

## 2016-01-14 DIAGNOSIS — R39198 Other difficulties with micturition: Secondary | ICD-10-CM | POA: Diagnosis not present

## 2016-01-14 DIAGNOSIS — Z6828 Body mass index (BMI) 28.0-28.9, adult: Secondary | ICD-10-CM | POA: Diagnosis not present

## 2016-01-14 DIAGNOSIS — J209 Acute bronchitis, unspecified: Secondary | ICD-10-CM | POA: Diagnosis not present

## 2016-04-04 DIAGNOSIS — J449 Chronic obstructive pulmonary disease, unspecified: Secondary | ICD-10-CM | POA: Diagnosis not present

## 2016-04-04 DIAGNOSIS — E782 Mixed hyperlipidemia: Secondary | ICD-10-CM | POA: Diagnosis not present

## 2016-04-04 DIAGNOSIS — Z1389 Encounter for screening for other disorder: Secondary | ICD-10-CM | POA: Diagnosis not present

## 2016-04-04 DIAGNOSIS — E1129 Type 2 diabetes mellitus with other diabetic kidney complication: Secondary | ICD-10-CM | POA: Diagnosis not present

## 2016-04-04 DIAGNOSIS — G894 Chronic pain syndrome: Secondary | ICD-10-CM | POA: Diagnosis not present

## 2016-04-04 DIAGNOSIS — I10 Essential (primary) hypertension: Secondary | ICD-10-CM | POA: Diagnosis not present

## 2016-04-04 DIAGNOSIS — Z6828 Body mass index (BMI) 28.0-28.9, adult: Secondary | ICD-10-CM | POA: Diagnosis not present

## 2016-04-04 DIAGNOSIS — Z0001 Encounter for general adult medical examination with abnormal findings: Secondary | ICD-10-CM | POA: Diagnosis not present

## 2016-04-04 DIAGNOSIS — F419 Anxiety disorder, unspecified: Secondary | ICD-10-CM | POA: Diagnosis not present

## 2016-04-13 DIAGNOSIS — M4302 Spondylolysis, cervical region: Secondary | ICD-10-CM | POA: Diagnosis not present

## 2016-04-13 DIAGNOSIS — M5126 Other intervertebral disc displacement, lumbar region: Secondary | ICD-10-CM | POA: Diagnosis not present

## 2016-04-13 DIAGNOSIS — M47816 Spondylosis without myelopathy or radiculopathy, lumbar region: Secondary | ICD-10-CM | POA: Diagnosis not present

## 2016-07-12 DIAGNOSIS — I2510 Atherosclerotic heart disease of native coronary artery without angina pectoris: Secondary | ICD-10-CM | POA: Diagnosis not present

## 2016-07-12 DIAGNOSIS — E782 Mixed hyperlipidemia: Secondary | ICD-10-CM | POA: Diagnosis not present

## 2016-07-12 DIAGNOSIS — Z23 Encounter for immunization: Secondary | ICD-10-CM | POA: Diagnosis not present

## 2016-07-12 DIAGNOSIS — I10 Essential (primary) hypertension: Secondary | ICD-10-CM | POA: Diagnosis not present

## 2016-07-12 DIAGNOSIS — E119 Type 2 diabetes mellitus without complications: Secondary | ICD-10-CM | POA: Diagnosis not present

## 2016-07-12 DIAGNOSIS — Z6827 Body mass index (BMI) 27.0-27.9, adult: Secondary | ICD-10-CM | POA: Diagnosis not present

## 2016-07-12 DIAGNOSIS — Z1389 Encounter for screening for other disorder: Secondary | ICD-10-CM | POA: Diagnosis not present

## 2016-07-13 DIAGNOSIS — M4302 Spondylolysis, cervical region: Secondary | ICD-10-CM | POA: Diagnosis not present

## 2016-07-25 ENCOUNTER — Encounter: Payer: Self-pay | Admitting: Internal Medicine

## 2016-08-11 ENCOUNTER — Emergency Department (HOSPITAL_COMMUNITY)
Admission: EM | Admit: 2016-08-11 | Discharge: 2016-08-11 | Disposition: A | Discharge status: Home / Self Care | Payer: Medicare Other | Attending: Emergency Medicine | Admitting: Emergency Medicine

## 2016-08-11 ENCOUNTER — Emergency Department (HOSPITAL_COMMUNITY): Payer: Medicare Other

## 2016-08-11 ENCOUNTER — Other Ambulatory Visit: Payer: Self-pay

## 2016-08-11 ENCOUNTER — Encounter (HOSPITAL_COMMUNITY): Payer: Self-pay

## 2016-08-11 DIAGNOSIS — Z79899 Other long term (current) drug therapy: Secondary | ICD-10-CM | POA: Diagnosis not present

## 2016-08-11 DIAGNOSIS — R072 Precordial pain: Secondary | ICD-10-CM | POA: Diagnosis not present

## 2016-08-11 DIAGNOSIS — R079 Chest pain, unspecified: Secondary | ICD-10-CM | POA: Diagnosis not present

## 2016-08-11 DIAGNOSIS — R0601 Orthopnea: Secondary | ICD-10-CM | POA: Diagnosis not present

## 2016-08-11 DIAGNOSIS — Z6828 Body mass index (BMI) 28.0-28.9, adult: Secondary | ICD-10-CM | POA: Diagnosis not present

## 2016-08-11 DIAGNOSIS — I10 Essential (primary) hypertension: Secondary | ICD-10-CM | POA: Insufficient documentation

## 2016-08-11 DIAGNOSIS — Z87891 Personal history of nicotine dependence: Secondary | ICD-10-CM | POA: Diagnosis not present

## 2016-08-11 DIAGNOSIS — H539 Unspecified visual disturbance: Secondary | ICD-10-CM | POA: Insufficient documentation

## 2016-08-11 DIAGNOSIS — J449 Chronic obstructive pulmonary disease, unspecified: Secondary | ICD-10-CM | POA: Diagnosis not present

## 2016-08-11 DIAGNOSIS — J441 Chronic obstructive pulmonary disease with (acute) exacerbation: Secondary | ICD-10-CM

## 2016-08-11 DIAGNOSIS — R0602 Shortness of breath: Secondary | ICD-10-CM | POA: Diagnosis not present

## 2016-08-11 DIAGNOSIS — E663 Overweight: Secondary | ICD-10-CM | POA: Diagnosis not present

## 2016-08-11 DIAGNOSIS — R21 Rash and other nonspecific skin eruption: Secondary | ICD-10-CM | POA: Insufficient documentation

## 2016-08-11 DIAGNOSIS — E119 Type 2 diabetes mellitus without complications: Secondary | ICD-10-CM | POA: Diagnosis not present

## 2016-08-11 LAB — CBC WITH DIFFERENTIAL/PLATELET
Basophils Absolute: 0 10*3/uL (ref 0.0–0.1)
Basophils Relative: 1 %
EOS ABS: 0.1 10*3/uL (ref 0.0–0.7)
Eosinophils Relative: 2 %
HEMATOCRIT: 47.1 % (ref 39.0–52.0)
HEMOGLOBIN: 16.6 g/dL (ref 13.0–17.0)
LYMPHS ABS: 2.2 10*3/uL (ref 0.7–4.0)
LYMPHS PCT: 31 %
MCH: 31.9 pg (ref 26.0–34.0)
MCHC: 35.2 g/dL (ref 30.0–36.0)
MCV: 90.4 fL (ref 78.0–100.0)
MONOS PCT: 7 %
Monocytes Absolute: 0.5 10*3/uL (ref 0.1–1.0)
NEUTROS PCT: 59 %
Neutro Abs: 4.3 10*3/uL (ref 1.7–7.7)
Platelets: 224 10*3/uL (ref 150–400)
RBC: 5.21 MIL/uL (ref 4.22–5.81)
RDW: 12.3 % (ref 11.5–15.5)
WBC: 7.1 10*3/uL (ref 4.0–10.5)

## 2016-08-11 LAB — BASIC METABOLIC PANEL
Anion gap: 9 (ref 5–15)
BUN: 15 mg/dL (ref 6–20)
CHLORIDE: 104 mmol/L (ref 101–111)
CO2: 25 mmol/L (ref 22–32)
CREATININE: 1 mg/dL (ref 0.61–1.24)
Calcium: 9.3 mg/dL (ref 8.9–10.3)
GFR calc Af Amer: >60 mL/min (ref >60)
GFR calc non Af Amer: >60 mL/min (ref >60)
GLUCOSE: 109 mg/dL — AB (ref 65–99)
POTASSIUM: 4 mmol/L (ref 3.5–5.1)
Sodium: 138 mmol/L (ref 135–145)

## 2016-08-11 LAB — TROPONIN I

## 2016-08-11 LAB — BRAIN NATRIURETIC PEPTIDE: B NATRIURETIC PEPTIDE 5: 41 pg/mL (ref 0.0–100.0)

## 2016-08-11 LAB — D-DIMER, QUANTITATIVE: D-Dimer, Quant: <0.27 ug/mL-FEU (ref 0.00–0.50)

## 2016-08-11 MED ORDER — ALBUTEROL SULFATE (2.5 MG/3ML) 0.083% IN NEBU
5.0000 mg | INHALATION_SOLUTION | Freq: Once | RESPIRATORY_TRACT | Status: AC
  Administered 2016-08-11: 5 mg via RESPIRATORY_TRACT
  Filled 2016-08-11: qty 6

## 2016-08-11 MED ORDER — PREDNISONE 10 MG PO TABS: 40.0000 mg | ORAL_TABLET | Freq: Every day | ORAL | 0 refills | Status: DC

## 2016-08-11 NOTE — ED Triage Notes (Signed)
Pt reports chest tightness and sob x 3 weeks.  Reports is worse at night when he lays down.  Pt reports hands feel swollen.  Denies any n/v or lightheadedness.

## 2016-08-11 NOTE — Discharge Instructions (Signed)
Workup for the chest pain and shortness of breath here without any acute findings. Suspicious for exacerbation of COPD. Patient felt much better after albuterol Atrovent nebulizer. Patient has albuterol at home he'll continue to use it for the next 7 days. Also will be started on a course of prednisone for the next 5 days.

## 2016-08-11 NOTE — ED Provider Notes (Signed)
[facility] DEPT Provider Note   CSN: [id number] Arrival date & time: 08/11/16  7322  By signing my name below, I, Rayna Sexton, attest that this documentation has been prepared under the direction and in the presence of Fredia Sorrow, MD. Electronically Signed: Rayna Sexton, ED Scribe. 08/11/16. 10:01 AM.   History   Chief Complaint Chief Complaint  Patient presents with  . Chest Pain    HPI HPI Comments: VIC ESCO is a 55 y.o. male with a h/o COPD, HTN, DM and angina who presents to the Emergency Department complaining of mild SOB x 3 weeks. He states his SOB worsens when lying supine or with exertion. He reports associated, mild, chest tightness, hand swelling, and diaphoresis. Pt also reports mild blurry vision onset a few days ago as well as a mild rash on his bilateral legs which he states began after beginning use of a new fabric softener. Pt denies a h/o stenting or any cardiac surgeries. Pt is not on anticoagulation. He denies nausea, vomiting or other associated symptoms at this time.   The history is provided by the patient and medical records. No language interpreter was used.  Chest Pain   This is a new problem. The current episode started more than 1 week ago. The problem occurs constantly. The problem has not changed since onset.The pain is associated with exertion. The pain is present in the substernal region. The pain is mild. The pain does not radiate. The symptoms are aggravated by certain positions and exertion. Associated symptoms include back pain (chronic), cough (productive) and shortness of breath. Pertinent negatives include no abdominal pain, no fever, no headaches, no nausea and no vomiting. Risk factors include male gender and obesity.  His past medical history is significant for COPD.    Past Medical History:  Diagnosis Date  . Anxiety   . COPD (chronic obstructive pulmonary disease) ([place])   . DDD (degenerative disc disease)   . GERD  (gastroesophageal reflux disease)   . HTN (hypertension)   . Seizures ([place])    last seizure 7 yrs ago-unknown etiology    Patient Active Problem List   Diagnosis Date Noted  . Angina at rest [facility]) 05/10/2012  . HTN (hypertension) 05/10/2012  . Chronic pain 05/10/2012  . Diabetes mellitus ([place]) 05/10/2012  . Screening for colon cancer 07/25/2011    Past Surgical History:  Procedure Laterality Date  . CERVICAL DISC SURGERY     X4  . CHOLECYSTECTOMY    . COLONOSCOPY  08/24/2011   Procedure: COLONOSCOPY;  Surgeon: Daneil Dolin, MD;  Location: [facility] ENDO SUITE;  Service: Endoscopy;  Laterality: N/A;  7:30AM  . ELBOW SURGERY     right       Home Medications    Prior to Admission medications   Medication Sig Start Date End Date Taking? Authorizing Provider  albuterol (PROVENTIL HFA;VENTOLIN HFA) 108 (90 BASE) MCG/ACT inhaler Inhale 2 puffs into the lungs every 6 (six) hours as needed. Shortness of breath    Yes Historical Provider, MD  alprazolam Duanne Moron) 2 MG tablet Take 2 mg by mouth at bedtime as needed. sleep   Yes Historical Provider, MD  carbamazepine (TEGRETOL) 200 MG tablet Take 200 mg by mouth 2 (two) times daily.    Yes Historical Provider, MD  cyclobenzaprine (FLEXERIL) 10 MG tablet 1 tablet daily as needed. 07/11/16  Yes Historical Provider, MD  fluticasone (FLONASE) 50 MCG/ACT nasal spray 2 sprays 2 (two) times daily. 07/11/16  Yes Historical Provider, MD  lisinopril (PRINIVIL,ZESTRIL) 20 MG tablet Take 20 mg by mouth 2 (two) times daily.   Yes Historical Provider, MD  naproxen (NAPROSYN) 500 MG tablet Take 500 mg by mouth 2 (two) times daily with a meal.     Yes Historical Provider, MD  oxyCODONE (ROXICODONE) 15 MG immediate release tablet Take 15 mg by mouth 4 (four) times daily as needed. Pain   Yes Historical Provider, MD  pantoprazole (PROTONIX) 40 MG tablet 1 tablet daily. 07/11/16  Yes Historical Provider, MD  tamsulosin (FLOMAX) 0.4 MG CAPS capsule 1 capsule  daily. 07/11/16  Yes Historical Provider, MD  predniSONE (DELTASONE) 10 MG tablet Take 4 tablets (40 mg total) by mouth daily. 08/11/16   Fredia Sorrow, MD    Family History Family History  Problem Relation Age of Onset  . Hypertension Father   . Heart attack Father   . Colon cancer Neg Hx   . Anesthesia problems Neg Hx   . Hypotension Neg Hx   . Malignant hyperthermia Neg Hx   . Pseudochol deficiency Neg Hx     Social History Social History  Substance Use Topics  . Smoking status: Former Smoker    Packs/day: 1.50    Years: 30.00    Types: Cigarettes    Quit date: 06/24/2011  . Smokeless tobacco: Never Used     Comment: 1-3 cigarettes   . Alcohol use No     Allergies   Patient has no known allergies.   Review of Systems Review of Systems  Constitutional: Negative for chills and fever.  HENT: Negative for congestion, postnasal drip, rhinorrhea, sinus pressure and sore throat.   Eyes: Positive for visual disturbance.  Respiratory: Positive for cough (productive), chest tightness and shortness of breath.   Cardiovascular: Positive for chest pain. Negative for leg swelling.  Gastrointestinal: Negative for abdominal pain, diarrhea, nausea and vomiting.  Genitourinary: Negative for dysuria and hematuria.  Musculoskeletal: Positive for back pain (chronic).  Skin: Positive for rash.  Neurological: Negative for syncope, light-headedness and headaches.  Hematological: Does not bruise/bleed easily.  Psychiatric/Behavioral: Negative for confusion.  All other systems reviewed and are negative.  Physical Exam Updated Vital Signs BP 142/82   Pulse 67   Temp 98.2 F (36.8 C) (Oral)   Resp 14   Ht '5\' 6"'$  (1.676 m)   Wt 81.6 kg   SpO2 99%   BMI 29.05 kg/m   Physical Exam  Constitutional: He is oriented to person, place, and time. He appears well-developed and well-nourished. No distress.  HENT:  Head: Normocephalic and atraumatic.  Mouth/Throat: Oropharynx is clear  and moist and mucous membranes are normal.  Eyes: Conjunctivae and EOM are normal. Pupils are equal, round, and reactive to light. No scleral icterus.  Neck: Normal range of motion. Neck supple. No tracheal deviation present.  Cardiovascular: Normal rate, regular rhythm, normal heart sounds and intact distal pulses.  Exam reveals no gallop and no friction rub.   No murmur heard. Pulmonary/Chest: Effort normal and breath sounds normal. No respiratory distress. He has no wheezes. He has no rales.  99% on RA during examination   Abdominal: Soft. Bowel sounds are normal. There is no tenderness.  Musculoskeletal: Normal range of motion. He exhibits no edema.  No edema noted to the bilateral legs.   Neurological: He is alert and oriented to person, place, and time. No cranial nerve deficit. He exhibits normal muscle tone. Coordination normal.  Skin: Skin is warm and dry.  Psychiatric: He has a normal  mood and affect. His behavior is normal.  Nursing note and vitals reviewed.  ED Treatments / Results  Labs (all labs ordered are listed, but only abnormal results are displayed) Labs Reviewed  BASIC METABOLIC PANEL - Abnormal; Notable for the following:       Result Value   Glucose, Bld 109 (*)    All other components within normal limits  CBC WITH DIFFERENTIAL/PLATELET  TROPONIN I  BRAIN NATRIURETIC PEPTIDE  D-DIMER, QUANTITATIVE (NOT AT [facility])    EKG  EKG Interpretation None       Radiology Dg Chest 2 View  Result Date: 08/11/2016 CLINICAL DATA:  Shortness of breath and chest tightness for the past 3 weeks. Patient also reports hand swelling. History of COPD, diabetes, and hypertension. Discontinued smoking 5 years ago. EXAM: CHEST  2 VIEW COMPARISON:  Chest x-ray of May 10, 2012 FINDINGS: The lungs are well-expanded. There is no hemidiaphragm flattening. There is no focal infiltrate. The heart is normal in size. The pulmonary vascularity is not engorged. The mediastinum is normal  in width. There is no pleural effusion or pneumothorax. The observed bony thorax exhibits no acute abnormality. IMPRESSION: Mild chronic bronchitic changes. There is no active cardiopulmonary disease. Electronically Signed   By: David  Martinique M.D.   On: 08/11/2016 09:33    Procedures Procedures  DIAGNOSTIC STUDIES: Oxygen Saturation is 98% on RA, normal by my interpretation.    COORDINATION OF CARE: 9:58 AM Discussed next steps with pt. Pt verbalized understanding and is agreeable with the plan.    Medications Ordered in ED Medications  albuterol (PROVENTIL) (2.5 MG/3ML) 0.083% nebulizer solution 5 mg (5 mg Nebulization Given 08/11/16 1025)     Initial Impression / Assessment and Plan / ED Course  I have reviewed the triage vital signs and the nursing notes.  Pertinent labs & imaging results that were available during my care of the patient were reviewed by me and considered in my medical decision making (see chart for details).  Clinical Course    Patient followed by Dr. Delice Bison office. Referred in for chest pain and shortness of breath. Symptoms of been present for 2-3 weeks. Workup here troponin negative d-dimer negative BNP nonelevated chest x-ray negative. No acute findings. Suspicious for exacerbation of COPD. Patient treated here with albuterol Atrovent with improvement. No distinct wheezing upon arrival but did have bilateral rhonchi. Will be discharged home continuing his albuterol inhaler and using prednisone for the next 5 days and make an appointment to follow-up with his doctor.  I personally performed the services described in this documentation, which was scribed in my presence. The recorded information has been reviewed and is accurate.    Final Clinical Impressions(s) / ED Diagnoses   Final diagnoses:  Chest pain, unspecified type  COPD exacerbation (HCC)    New Prescriptions New Prescriptions   PREDNISONE (DELTASONE) 10 MG TABLET    Take 4 tablets (40 mg total)  by mouth daily.     Fredia Sorrow, MD 08/11/16 1339

## 2016-08-11 NOTE — ED Notes (Signed)
RT paged.

## 2016-10-12 DIAGNOSIS — Z6828 Body mass index (BMI) 28.0-28.9, adult: Secondary | ICD-10-CM | POA: Diagnosis not present

## 2016-10-12 DIAGNOSIS — G894 Chronic pain syndrome: Secondary | ICD-10-CM | POA: Diagnosis not present

## 2016-10-12 DIAGNOSIS — I10 Essential (primary) hypertension: Secondary | ICD-10-CM | POA: Diagnosis not present

## 2016-10-12 DIAGNOSIS — J449 Chronic obstructive pulmonary disease, unspecified: Secondary | ICD-10-CM | POA: Diagnosis not present

## 2016-10-13 DIAGNOSIS — M4302 Spondylolysis, cervical region: Secondary | ICD-10-CM | POA: Diagnosis not present

## 2016-10-26 ENCOUNTER — Emergency Department (HOSPITAL_COMMUNITY): Payer: Medicare Other

## 2016-10-26 ENCOUNTER — Inpatient Hospital Stay (HOSPITAL_COMMUNITY): Payer: Medicare Other

## 2016-10-26 ENCOUNTER — Inpatient Hospital Stay (HOSPITAL_COMMUNITY)
Admission: EM | Admit: 2016-10-26 | Discharge: 2016-10-28 | DRG: 069 | Disposition: A | Discharge status: Home / Self Care | Payer: Medicare Other | Attending: Internal Medicine | Admitting: Internal Medicine

## 2016-10-26 ENCOUNTER — Encounter (HOSPITAL_COMMUNITY): Payer: Self-pay | Admitting: Emergency Medicine

## 2016-10-26 DIAGNOSIS — R531 Weakness: Secondary | ICD-10-CM | POA: Diagnosis not present

## 2016-10-26 DIAGNOSIS — I679 Cerebrovascular disease, unspecified: Secondary | ICD-10-CM | POA: Diagnosis present

## 2016-10-26 DIAGNOSIS — I10 Essential (primary) hypertension: Secondary | ICD-10-CM | POA: Diagnosis not present

## 2016-10-26 DIAGNOSIS — F419 Anxiety disorder, unspecified: Secondary | ICD-10-CM | POA: Diagnosis present

## 2016-10-26 DIAGNOSIS — Z8249 Family history of ischemic heart disease and other diseases of the circulatory system: Secondary | ICD-10-CM

## 2016-10-26 DIAGNOSIS — E119 Type 2 diabetes mellitus without complications: Secondary | ICD-10-CM | POA: Diagnosis present

## 2016-10-26 DIAGNOSIS — Z981 Arthrodesis status: Secondary | ICD-10-CM | POA: Diagnosis not present

## 2016-10-26 DIAGNOSIS — R51 Headache: Secondary | ICD-10-CM | POA: Diagnosis not present

## 2016-10-26 DIAGNOSIS — M542 Cervicalgia: Secondary | ICD-10-CM | POA: Diagnosis not present

## 2016-10-26 DIAGNOSIS — Z87891 Personal history of nicotine dependence: Secondary | ICD-10-CM | POA: Diagnosis not present

## 2016-10-26 DIAGNOSIS — R4781 Slurred speech: Secondary | ICD-10-CM | POA: Diagnosis not present

## 2016-10-26 DIAGNOSIS — Z79899 Other long term (current) drug therapy: Secondary | ICD-10-CM

## 2016-10-26 DIAGNOSIS — K219 Gastro-esophageal reflux disease without esophagitis: Secondary | ICD-10-CM | POA: Diagnosis present

## 2016-10-26 DIAGNOSIS — J449 Chronic obstructive pulmonary disease, unspecified: Secondary | ICD-10-CM | POA: Diagnosis present

## 2016-10-26 DIAGNOSIS — F17210 Nicotine dependence, cigarettes, uncomplicated: Secondary | ICD-10-CM | POA: Diagnosis present

## 2016-10-26 DIAGNOSIS — M1990 Unspecified osteoarthritis, unspecified site: Secondary | ICD-10-CM | POA: Diagnosis present

## 2016-10-26 DIAGNOSIS — R911 Solitary pulmonary nodule: Secondary | ICD-10-CM | POA: Diagnosis not present

## 2016-10-26 DIAGNOSIS — Z79891 Long term (current) use of opiate analgesic: Secondary | ICD-10-CM

## 2016-10-26 DIAGNOSIS — Z9181 History of falling: Secondary | ICD-10-CM

## 2016-10-26 DIAGNOSIS — I6522 Occlusion and stenosis of left carotid artery: Secondary | ICD-10-CM | POA: Diagnosis not present

## 2016-10-26 DIAGNOSIS — H538 Other visual disturbances: Secondary | ICD-10-CM | POA: Diagnosis not present

## 2016-10-26 DIAGNOSIS — G459 Transient cerebral ischemic attack, unspecified: Secondary | ICD-10-CM | POA: Diagnosis not present

## 2016-10-26 DIAGNOSIS — R203 Hyperesthesia: Secondary | ICD-10-CM | POA: Diagnosis not present

## 2016-10-26 DIAGNOSIS — R569 Unspecified convulsions: Secondary | ICD-10-CM | POA: Diagnosis not present

## 2016-10-26 DIAGNOSIS — G8929 Other chronic pain: Secondary | ICD-10-CM | POA: Diagnosis present

## 2016-10-26 DIAGNOSIS — R918 Other nonspecific abnormal finding of lung field: Secondary | ICD-10-CM | POA: Diagnosis not present

## 2016-10-26 LAB — URINALYSIS, ROUTINE W REFLEX MICROSCOPIC
BILIRUBIN URINE: NEGATIVE
Glucose, UA: NEGATIVE mg/dL
HGB URINE DIPSTICK: NEGATIVE
Ketones, ur: NEGATIVE mg/dL
Leukocytes, UA: NEGATIVE
Nitrite: NEGATIVE
PROTEIN: NEGATIVE mg/dL
Specific Gravity, Urine: 1.004 — ABNORMAL LOW (ref 1.005–1.030)
pH: 6 (ref 5.0–8.0)

## 2016-10-26 LAB — COMPREHENSIVE METABOLIC PANEL
ALK PHOS: 73 U/L (ref 38–126)
ALT: 24 U/L (ref 17–63)
AST: 19 U/L (ref 15–41)
Albumin: 4.3 g/dL (ref 3.5–5.0)
Anion gap: 8 (ref 5–15)
BUN: 12 mg/dL (ref 6–20)
CALCIUM: 9.4 mg/dL (ref 8.9–10.3)
CHLORIDE: 103 mmol/L (ref 101–111)
CO2: 26 mmol/L (ref 22–32)
CREATININE: 0.97 mg/dL (ref 0.61–1.24)
GFR calc Af Amer: >60 mL/min (ref >60)
Glucose, Bld: 104 mg/dL — ABNORMAL HIGH (ref 65–99)
Potassium: 4.5 mmol/L (ref 3.5–5.1)
Sodium: 137 mmol/L (ref 135–145)
Total Bilirubin: 0.9 mg/dL (ref 0.3–1.2)
Total Protein: 7.2 g/dL (ref 6.5–8.1)

## 2016-10-26 LAB — DIFFERENTIAL
BASOS ABS: 0 10*3/uL (ref 0.0–0.1)
BASOS PCT: 1 %
Eosinophils Absolute: 0.1 10*3/uL (ref 0.0–0.7)
Eosinophils Relative: 1 %
LYMPHS ABS: 1.8 10*3/uL (ref 0.7–4.0)
LYMPHS PCT: 27 %
MONOS PCT: 7 %
Monocytes Absolute: 0.4 10*3/uL (ref 0.1–1.0)
NEUTROS ABS: 4.1 10*3/uL (ref 1.7–7.7)
Neutrophils Relative %: 64 %

## 2016-10-26 LAB — ETHANOL: Alcohol, Ethyl (B): <5 mg/dL (ref <5)

## 2016-10-26 LAB — PROTIME-INR
INR: 0.99
Prothrombin Time: 13.1 seconds (ref 11.4–15.2)

## 2016-10-26 LAB — RAPID URINE DRUG SCREEN, HOSP PERFORMED
AMPHETAMINES: NOT DETECTED
BARBITURATES: NOT DETECTED
BENZODIAZEPINES: NOT DETECTED
Cocaine: NOT DETECTED
Opiates: NOT DETECTED
Tetrahydrocannabinol: NOT DETECTED

## 2016-10-26 LAB — CBC
HEMATOCRIT: 45.2 % (ref 39.0–52.0)
HEMOGLOBIN: 15.9 g/dL (ref 13.0–17.0)
MCH: 31.2 pg (ref 26.0–34.0)
MCHC: 35.2 g/dL (ref 30.0–36.0)
MCV: 88.6 fL (ref 78.0–100.0)
Platelets: 201 10*3/uL (ref 150–400)
RBC: 5.1 MIL/uL (ref 4.22–5.81)
RDW: 12.4 % (ref 11.5–15.5)
WBC: 6.4 10*3/uL (ref 4.0–10.5)

## 2016-10-26 LAB — C-REACTIVE PROTEIN

## 2016-10-26 LAB — APTT: APTT: 31 s (ref 24–36)

## 2016-10-26 LAB — I-STAT TROPONIN, ED: TROPONIN I, POC: 0 ng/mL (ref 0.00–0.08)

## 2016-10-26 LAB — SEDIMENTATION RATE: Sed Rate: 4 mm/hr (ref 0–16)

## 2016-10-26 MED ORDER — ASPIRIN 325 MG PO TABS
325.0000 mg | ORAL_TABLET | Freq: Every day | ORAL | Status: DC
  Administered 2016-10-26 – 2016-10-28 (×3): 325 mg via ORAL
  Filled 2016-10-26 (×3): qty 1

## 2016-10-26 MED ORDER — METOCLOPRAMIDE HCL 5 MG/ML IJ SOLN
10.0000 mg | Freq: Once | INTRAMUSCULAR | Status: AC
  Administered 2016-10-26: 10 mg via INTRAVENOUS
  Filled 2016-10-26: qty 2

## 2016-10-26 MED ORDER — DIPHENHYDRAMINE HCL 50 MG/ML IJ SOLN
25.0000 mg | Freq: Once | INTRAMUSCULAR | Status: AC
  Administered 2016-10-26: 25 mg via INTRAVENOUS
  Filled 2016-10-26: qty 1

## 2016-10-26 MED ORDER — ASPIRIN 300 MG RE SUPP: 300.0000 mg | Freq: Every day | RECTAL | Status: DC

## 2016-10-26 MED ORDER — IOPAMIDOL (ISOVUE-370) INJECTION 76%
100.0000 mL | Freq: Once | INTRAVENOUS | Status: AC | PRN
  Administered 2016-10-26: 100 mL via INTRAVENOUS

## 2016-10-26 MED ORDER — ENOXAPARIN SODIUM 40 MG/0.4ML ~~LOC~~ SOLN
40.0000 mg | SUBCUTANEOUS | Status: DC
  Administered 2016-10-27: 40 mg via SUBCUTANEOUS
  Filled 2016-10-26: qty 0.4

## 2016-10-26 MED ORDER — ALBUTEROL SULFATE (2.5 MG/3ML) 0.083% IN NEBU
2.5000 mg | INHALATION_SOLUTION | Freq: Four times a day (QID) | RESPIRATORY_TRACT | Status: DC | PRN
  Filled 2016-10-26: qty 3

## 2016-10-26 MED ORDER — ALBUTEROL SULFATE HFA 108 (90 BASE) MCG/ACT IN AERS
2.0000 | INHALATION_SPRAY | Freq: Four times a day (QID) | RESPIRATORY_TRACT | Status: DC | PRN
Start: 2016-10-26 — End: 2016-10-26

## 2016-10-26 MED ORDER — SENNOSIDES-DOCUSATE SODIUM 8.6-50 MG PO TABS: 1.0000 | ORAL_TABLET | Freq: Every evening | ORAL | Status: DC | PRN

## 2016-10-26 MED ORDER — FLUTICASONE PROPIONATE 50 MCG/ACT NA SUSP
2.0000 | Freq: Two times a day (BID) | NASAL | Status: DC
  Administered 2016-10-26 – 2016-10-28 (×4): 2 via NASAL
  Filled 2016-10-26: qty 16

## 2016-10-26 MED ORDER — HYDRALAZINE HCL 20 MG/ML IJ SOLN: 10.0000 mg | Freq: Three times a day (TID) | INTRAMUSCULAR | Status: DC | PRN

## 2016-10-26 MED ORDER — TAMSULOSIN HCL 0.4 MG PO CAPS
0.4000 mg | ORAL_CAPSULE | Freq: Every day | ORAL | Status: DC
  Administered 2016-10-26 – 2016-10-28 (×3): 0.4 mg via ORAL
  Filled 2016-10-26 (×3): qty 1

## 2016-10-26 MED ORDER — SODIUM CHLORIDE 0.9 % IV SOLN
INTRAVENOUS | Status: DC
  Administered 2016-10-26 – 2016-10-28 (×3): via INTRAVENOUS

## 2016-10-26 MED ORDER — ACETAMINOPHEN 160 MG/5ML PO SOLN: 650.0000 mg | ORAL | Status: DC | PRN

## 2016-10-26 MED ORDER — CYCLOBENZAPRINE HCL 10 MG PO TABS: 10.0000 mg | ORAL_TABLET | Freq: Three times a day (TID) | ORAL | Status: DC | PRN

## 2016-10-26 MED ORDER — CARBAMAZEPINE 200 MG PO TABS
200.0000 mg | ORAL_TABLET | Freq: Two times a day (BID) | ORAL | Status: DC
  Administered 2016-10-26 – 2016-10-28 (×4): 200 mg via ORAL
  Filled 2016-10-26 (×4): qty 1

## 2016-10-26 MED ORDER — SODIUM CHLORIDE 0.9 % IV BOLUS (SEPSIS)
1000.0000 mL | Freq: Once | INTRAVENOUS | Status: AC
  Administered 2016-10-26: 1000 mL via INTRAVENOUS

## 2016-10-26 MED ORDER — ACETAMINOPHEN 325 MG PO TABS
650.0000 mg | ORAL_TABLET | ORAL | Status: DC | PRN
  Administered 2016-10-26 – 2016-10-28 (×5): 650 mg via ORAL
  Filled 2016-10-26 (×5): qty 2

## 2016-10-26 MED ORDER — STROKE: EARLY STAGES OF RECOVERY BOOK
Freq: Once | Status: AC
  Administered 2016-10-27: 10:00:00
  Filled 2016-10-26: qty 1

## 2016-10-26 MED ORDER — ALPRAZOLAM 1 MG PO TABS
2.0000 mg | ORAL_TABLET | Freq: Every evening | ORAL | Status: DC | PRN
  Filled 2016-10-26: qty 2

## 2016-10-26 MED ORDER — ACETAMINOPHEN 650 MG RE SUPP: 650.0000 mg | RECTAL | Status: DC | PRN

## 2016-10-26 MED ORDER — PANTOPRAZOLE SODIUM 40 MG PO TBEC
40.0000 mg | DELAYED_RELEASE_TABLET | Freq: Every day | ORAL | Status: DC
  Administered 2016-10-26 – 2016-10-28 (×3): 40 mg via ORAL
  Filled 2016-10-26 (×3): qty 1

## 2016-10-26 MED ORDER — OXYCODONE HCL 5 MG PO TABS
5.0000 mg | ORAL_TABLET | Freq: Four times a day (QID) | ORAL | Status: DC | PRN
  Administered 2016-10-28 (×2): 5 mg via ORAL
  Filled 2016-10-26 (×3): qty 1

## 2016-10-26 NOTE — ED Triage Notes (Addendum)
Pt c/o right side neck pain and ha with cool numbness since 1130 yesterday. Pt also c/o increased blurry vision in right eye. Pt sent by pcp for evaluation.

## 2016-10-26 NOTE — H&P (Signed)
[facility] History and Physical  [name] [name] DGL:[id number] DOB: 1960/12/30 DOA: 10/26/2016  Referring physician:  PCP: [name], MD  Specialists:   Chief Complaint: right side neckpain, blurry vision   HPI: [name] [name] is a 56 y.o. male with PMH of COPD, HTN, Seizures, Anxiety, Cervical fusion, DJD presented with sudden onset of right sided neck pain associated with headache yesterday morning around 11.00 AM. He reports right sided numbness, mild weakness, blurry vision and slurred speech during this episode. Headache is resolved after 20 minutes. However, he was still experiencing mild blurry vision, mild generalized weakness and presented today for evaluation. He states that he had some dizziness, no vertigo. He reports chronic mild paresthesia and residual weakness from old c spine surgery. He denies seizures, no recent head or neck trauma. No fevers, no chest pains, no gi symptoms.  -ED: CTA neck was negative for dissection.  hospitalist is asked for admission stroke work up   Review of Systems: The patient denies anorexia, fever, weight loss,, vision loss, decreased hearing, hoarseness, chest pain, syncope, dyspnea on exertion, peripheral edema, balance deficits, hemoptysis, abdominal pain, melena, hematochezia, severe indigestion/heartburn, hematuria, incontinence, genital sores, muscle weakness, suspicious skin lesions, transient blindness, difficulty walking, depression, unusual weight change, abnormal bleeding, enlarged lymph nodes, angioedema, and breast masses.    Past Medical History:  Diagnosis Date  . Anxiety   . COPD (chronic obstructive pulmonary disease) ([place])   . DDD (degenerative disc disease)   . GERD (gastroesophageal reflux disease)   . HTN (hypertension)   . Seizures ([place])    last seizure 7 yrs ago-unknown etiology   Past Surgical History:  Procedure Laterality Date  . CERVICAL DISC SURGERY     X4  . CHOLECYSTECTOMY    . COLONOSCOPY   08/24/2011   Procedure: COLONOSCOPY;  Surgeon: Daneil Dolin, MD;  Location: [facility];  Service: Endoscopy;  Laterality: N/A;  7:30AM  . ELBOW SURGERY     right   Social History:  reports that he quit smoking about 5 years ago. His smoking use included Cigarettes. He has a 45.00 pack-year smoking history. He has never used smokeless tobacco. He reports that he does not drink alcohol or use drugs. Home;  where does patient live--home, ALF, SNF? and with whom if at home? Ys;  Can patient participate in ADLs?  No Known Allergies  Family History  Problem Relation Age of Onset  . Hypertension Father   . Heart attack Father   . Colon cancer Neg Hx   . Anesthesia problems Neg Hx   . Hypotension Neg Hx   . Malignant hyperthermia Neg Hx   . Pseudochol deficiency Neg Hx     (be sure to complete)  Prior to Admission medications   Medication Sig Start Date End Date Taking? Authorizing Provider  albuterol (PROVENTIL HFA;VENTOLIN HFA) 108 (90 BASE) MCG/ACT inhaler Inhale 2 puffs into the lungs every 6 (six) hours as needed. Shortness of breath    Yes Historical Provider, MD  alprazolam Duanne Moron) 2 MG tablet Take 2 mg by mouth at bedtime as needed. sleep   Yes Historical Provider, MD  amLODipine (NORVASC) 10 MG tablet Take 10 mg by mouth daily.   Yes Historical Provider, MD  carbamazepine (TEGRETOL) 200 MG tablet Take 200 mg by mouth 2 (two) times daily.    Yes Historical Provider, MD  cyclobenzaprine (FLEXERIL) 10 MG tablet 1 tablet daily as needed. 07/11/16  Yes Historical Provider, MD  fluticasone (FLONASE) 50  MCG/ACT nasal spray 2 sprays 2 (two) times daily. 07/11/16  Yes Historical Provider, MD  lisinopril (PRINIVIL,ZESTRIL) 20 MG tablet Take 20 mg by mouth 2 (two) times daily.   Yes Historical Provider, MD  naproxen (NAPROSYN) 500 MG tablet Take 500 mg by mouth 2 (two) times daily with a meal.     Yes Historical Provider, MD  OVER THE COUNTER MEDICATION Take 1 capsule by mouth once. CBD    Yes Historical Provider, MD  oxyCODONE (ROXICODONE) 15 MG immediate release tablet Take 15 mg by mouth 4 (four) times daily as needed. Pain   Yes Historical Provider, MD  oxycodone (ROXICODONE) 30 MG immediate release tablet Take 1 tablet by mouth 3 (three) times daily as needed for pain. 10/14/16  Yes Historical Provider, MD  pantoprazole (PROTONIX) 40 MG tablet 1 tablet daily. 07/11/16  Yes Historical Provider, MD  tamsulosin (FLOMAX) 0.4 MG CAPS capsule 1 capsule daily. 07/11/16  Yes Historical Provider, MD   Physical Exam: Vitals:   10/26/16 1400 10/26/16 1430  BP: 161/99 165/88  Pulse: 62 65  Resp: 12 21  Temp:       General:  Alert, no distress   Eyes: EOM-I  ENT: no oral ulcers   Neck: supple  Cardiovascular: s1.s2 rrr  Respiratory: CTA BL  Abdomen: soft, nt, nd   Skin: no rash   Musculoskeletal: no leg edema  Psychiatric: no hallucinations   Neurologic: CN 2-12 intact. Motor left 5/5 right upper and lower extremity probable 4/5. Sensation is decreased at C spine on right   Labs on Admission:  Basic Metabolic Panel:  Recent Labs Lab 10/26/16 1113  NA 137  K 4.5  CL 103  CO2 26  GLUCOSE 104*  BUN 12  CREATININE 0.97  CALCIUM 9.4   Liver Function Tests:  Recent Labs Lab 10/26/16 1113  AST 19  ALT 24  ALKPHOS 73  BILITOT 0.9  PROT 7.2  ALBUMIN 4.3   No results for input(s): LIPASE, AMYLASE in the last 168 hours. No results for input(s): AMMONIA in the last 168 hours. CBC:  Recent Labs Lab 10/26/16 1113  WBC 6.4  NEUTROABS 4.1  HGB 15.9  HCT 45.2  MCV 88.6  PLT 201   Cardiac Enzymes: No results for input(s): CKTOTAL, CKMB, CKMBINDEX, TROPONINI in the last 168 hours.  BNP (last 3 results)  Recent Labs  08/11/16 0912  BNP 41.0    ProBNP (last 3 results) No results for input(s): PROBNP in the last 8760 hours.  CBG: No results for input(s): GLUCAP in the last 168 hours.  Radiological Exams on Admission: Ct Angio Head W Or  Wo Contrast  Result Date: 10/26/2016 CLINICAL DATA:  56 year old male with right side headache neck pain blurred vision and weakness since 1130 hours yesterday. Initial encounter. EXAM: CT ANGIOGRAPHY HEAD AND NECK TECHNIQUE: Multidetector CT imaging of the head and neck was performed using the standard protocol during bolus administration of intravenous contrast. Multiplanar CT image reconstructions and MIPs were obtained to evaluate the vascular anatomy. Carotid stenosis measurements (when applicable) are obtained utilizing NASCET criteria, using the distal internal carotid diameter as the denominator. CONTRAST:  100 mL Isovue 370 COMPARISON:  Head CT without contrast 05/10/2012. Cervical spine MRI 03/18/2011. Brain MRI 05/01/2006 FINDINGS: CT HEAD Brain: Cerebral volume is stable and within normal limits. No midline shift, ventriculomegaly, mass effect, evidence of mass lesion, intracranial hemorrhage or evidence of cortically based acute infarction. Gray-white matter differentiation is within normal limits throughout the brain.  No cortical encephalomalacia identified. Calvarium and skull base: Negative. No acute osseous abnormality identified. Paranasal sinuses: Visualized paranasal sinuses and mastoids are stable and well pneumatized. Orbits: Stable orbit and scalp soft tissues. CTA NECK Skeleton: Extensive previous cervical spine fusion. C3-C4 and C5-C6 ACDF hardware remains in place. No acute osseous abnormality identified. Absent maxillary dentition. Upper chest: Up to 10 mm lateral left upper lobe subpleural lung nodules (series 8, image 6). Negative visible right lung parenchyma. No superior mediastinal or visible hilar lymphadenopathy. Other neck: Thyroid, larynx, pharynx, parapharyngeal spaces, retropharyngeal space, sublingual space, submandibular glands and parotid glands are within normal limits. No cervical lymphadenopathy. Aortic arch: 4 vessel arch configuration, the left vertebral artery arises  directly from the arch. Minimal arch atherosclerosis. No great vessel origin stenosis. Right carotid system: No brachiocephalic or right CCA stenosis with mild peripheral soft plaque. Mild soft and minimal calcified plaque at the right carotid bifurcation with no stenosis. Negative cervical right ICA otherwise. Left carotid system: No left CCA stenosis. Low-density plaque in the posterior and medial left ICA origin and bulb (series 8, image 84) without associated stenosis. Negative cervical left ICA otherwise. Vertebral arteries: No proximal right subclavian artery stenosis. There is mild soft plaque in the right V1 segment with about 50% stenosis (series 8, image 41) of the proximal right vertebral artery. The right vertebral artery is mildly dominant, and otherwise negative to the skullbase. The left vertebral artery arises directly from the arch and is mildly non dominant. No left vertebral artery stenosis to the skullbase. CTA HEAD Posterior circulation: Dominant distal right vertebral artery with no stenosis. Both PICA origins are patent. The non dominant left vertebral artery is diminutive beyond the PICA. No basilar artery stenosis. SCA origins are normal. Fetal type bilateral PCA origins. Bilateral PCA branches are within normal limits. Anterior circulation: Both ICA siphons are patent, the right appears mildly dominant. No siphon plaque or stenosis identified. Normal ophthalmic and posterior communicating artery origins. Normal carotid termini, MCA and ACA origins. Anterior communicating artery and bilateral ACA branches are normal. Left MCA M1 segment, trifurcation, and left MCA branches are normal. Right MCA M1 segment, bifurcation, and right MCA branches appear normal. Venous sinuses: Patent. Anatomic variants: Left vertebral artery arises directly from the arch. Mildly dominant right vertebral artery. Fetal type bilateral PCA origins. Delayed phase: No abnormal enhancement identified. Review of the MIP  images confirms the above findings IMPRESSION: 1. Low-density soft plaque at the left ICA origin, but no associated stenosis. Moderate stenosis of the dominant right vertebral artery V1 segment due to soft plaque. 2. Otherwise negative Neck CTA, with otherwise minimal atherosclerosis in the neck. And Negative intracranial CTA. 3. Stable and normal CT appearance of the brain. 4. Partially visible subpleural left upper lobe lung nodules up to 10 mm. Recommend follow-up chest CT without contrast now to fully characterize the lungs and provide disposition for these nodules. 5. No acute findings in the neck. Previous extensive cervical spine fusion. Electronically Signed   By: Genevie Ann M.D.   On: 10/26/2016 13:15   Ct Angio Neck W And/or Wo Contrast  Result Date: 10/26/2016 CLINICAL DATA:  56 year old male with right side headache neck pain blurred vision and weakness since 1130 hours yesterday. Initial encounter. EXAM: CT ANGIOGRAPHY HEAD AND NECK TECHNIQUE: Multidetector CT imaging of the head and neck was performed using the standard protocol during bolus administration of intravenous contrast. Multiplanar CT image reconstructions and MIPs were obtained to evaluate the vascular anatomy. Carotid stenosis  measurements (when applicable) are obtained utilizing NASCET criteria, using the distal internal carotid diameter as the denominator. CONTRAST:  100 mL Isovue 370 COMPARISON:  Head CT without contrast 05/10/2012. Cervical spine MRI 03/18/2011. Brain MRI 05/01/2006 FINDINGS: CT HEAD Brain: Cerebral volume is stable and within normal limits. No midline shift, ventriculomegaly, mass effect, evidence of mass lesion, intracranial hemorrhage or evidence of cortically based acute infarction. Gray-white matter differentiation is within normal limits throughout the brain. No cortical encephalomalacia identified. Calvarium and skull base: Negative. No acute osseous abnormality identified. Paranasal sinuses: Visualized  paranasal sinuses and mastoids are stable and well pneumatized. Orbits: Stable orbit and scalp soft tissues. CTA NECK Skeleton: Extensive previous cervical spine fusion. C3-C4 and C5-C6 ACDF hardware remains in place. No acute osseous abnormality identified. Absent maxillary dentition. Upper chest: Up to 10 mm lateral left upper lobe subpleural lung nodules (series 8, image 6). Negative visible right lung parenchyma. No superior mediastinal or visible hilar lymphadenopathy. Other neck: Thyroid, larynx, pharynx, parapharyngeal spaces, retropharyngeal space, sublingual space, submandibular glands and parotid glands are within normal limits. No cervical lymphadenopathy. Aortic arch: 4 vessel arch configuration, the left vertebral artery arises directly from the arch. Minimal arch atherosclerosis. No great vessel origin stenosis. Right carotid system: No brachiocephalic or right CCA stenosis with mild peripheral soft plaque. Mild soft and minimal calcified plaque at the right carotid bifurcation with no stenosis. Negative cervical right ICA otherwise. Left carotid system: No left CCA stenosis. Low-density plaque in the posterior and medial left ICA origin and bulb (series 8, image 84) without associated stenosis. Negative cervical left ICA otherwise. Vertebral arteries: No proximal right subclavian artery stenosis. There is mild soft plaque in the right V1 segment with about 50% stenosis (series 8, image 41) of the proximal right vertebral artery. The right vertebral artery is mildly dominant, and otherwise negative to the skullbase. The left vertebral artery arises directly from the arch and is mildly non dominant. No left vertebral artery stenosis to the skullbase. CTA HEAD Posterior circulation: Dominant distal right vertebral artery with no stenosis. Both PICA origins are patent. The non dominant left vertebral artery is diminutive beyond the PICA. No basilar artery stenosis. SCA origins are normal. Fetal type  bilateral PCA origins. Bilateral PCA branches are within normal limits. Anterior circulation: Both ICA siphons are patent, the right appears mildly dominant. No siphon plaque or stenosis identified. Normal ophthalmic and posterior communicating artery origins. Normal carotid termini, MCA and ACA origins. Anterior communicating artery and bilateral ACA branches are normal. Left MCA M1 segment, trifurcation, and left MCA branches are normal. Right MCA M1 segment, bifurcation, and right MCA branches appear normal. Venous sinuses: Patent. Anatomic variants: Left vertebral artery arises directly from the arch. Mildly dominant right vertebral artery. Fetal type bilateral PCA origins. Delayed phase: No abnormal enhancement identified. Review of the MIP images confirms the above findings IMPRESSION: 1. Low-density soft plaque at the left ICA origin, but no associated stenosis. Moderate stenosis of the dominant right vertebral artery V1 segment due to soft plaque. 2. Otherwise negative Neck CTA, with otherwise minimal atherosclerosis in the neck. And Negative intracranial CTA. 3. Stable and normal CT appearance of the brain. 4. Partially visible subpleural left upper lobe lung nodules up to 10 mm. Recommend follow-up chest CT without contrast now to fully characterize the lungs and provide disposition for these nodules. 5. No acute findings in the neck. Previous extensive cervical spine fusion. Electronically Signed   By: Genevie Ann M.D.   On:  10/26/2016 13:15    EKG: Independently reviewed.   Assessment/Plan Active Problems:   HTN (hypertension)   Blurry vision   Slurred speech   56 y.o. male with PMH of COPD, HTN, Seizures, Anxiety, Cervical fusion, DJD presented with sudden onset of right sided neck pain associated with headache with right sided numbness, mild weakness, blurry vision and slurred speech  Blurry vision and slurred speech yesterday. associated with right sided weakness. Unclear etiology. R/o  stroke vs TIA. CTA neck: no dissection -we will obtain stroke work up with mri brain, echo, lipid profile, ha1c, start aspirin. Monitor on tele, neuro checks. Will consult neurology. May need EEG  COPD. No s/s of exacerbation. CT neck showed subpleural left upper lobe lung nodules up to 10 mm. Recommend follow-up chest CT without contrast now to fully characterize the lungs.   Seizure history. Denies recent seizures. Cont home regimen  HTN. On amlodipine, lisinopril at home. Will hold due to probable acute stroke work up pend. Prn hydralazine for now, resume BP meds AM Chronic pain. H/o C spine fusion. On prn oxy at home.     Neurology;  if consultant consulted, please document name and whether formally or informally consulted  Code Status: full (must indicate code status--if unknown or must be presumed, indicate so) Family Communication: d/w patient, ED/MD (indicate person spoken with, if applicable, with phone number if by telephone) Disposition Plan: home 24-48 hrs  (indicate anticipated LOS)  Time spent: >45 minutes   Kinnie Feil [facility] Pager [phone]  If 7PM-7AM, please contact night-coverage [email or web address] Password [email or web address] 10/26/2016, 3:11 PM

## 2016-10-26 NOTE — ED Notes (Signed)
Pt resting quietly. No distress.

## 2016-10-26 NOTE — ED Provider Notes (Signed)
[facility] DEPT Provider Note   CSN: [id number] Arrival date & time: 10/26/16  1018   By signing my name below, I, Hilbert Odor, attest that this documentation has been prepared under the direction and in the presence of Sherwood Gambler, MD. Electronically Signed: Hilbert Odor, Scribe. 10/26/16. 11:12 AM. History   Chief Complaint Chief Complaint  Patient presents with  . Headache    The history is provided by the patient. No language interpreter was used.   HPI Comments: [name] [name] is a 56 y.o. male who presents to the Emergency Department complaining of sharp right-sided headache that began yesterday. He states that the headache was sudden. He reports feeling kind of numb on the right side of his head at that time but it has mostly resolved. He rates the pain at the time of the incident 10/10, but states that pain is currently 3/10. He states that he has never felt anything like this in the past. He hasn't taken anything for this pain. He reports that he was driving down the road when he started to experience this pain. He then developed some blurry vision which lasted for around 30 minutes before he could drive. He is still experiencing some blurry vision in his right eye that is not quite back to normal. He also reports neck pain, nausea, dizziness, bilateral leg weakness, and some ear pressure. He states that his dizziness has resolved. He states that his neck pain was kind of throbbing at that time. The patient is also experiencing some pressure behind his eye. He denies vomiting, CP, SOB, and leg swelling. He also denies taking blood thinners.  Past Medical History:  Diagnosis Date  . Anxiety   . COPD (chronic obstructive pulmonary disease) ([place])   . DDD (degenerative disc disease)   . GERD (gastroesophageal reflux disease)   . HTN (hypertension)   . Seizures ([place])    last seizure 7 yrs ago-unknown etiology    Patient Active Problem List   Diagnosis Date Noted    . Angina at rest [facility]) 05/10/2012  . HTN (hypertension) 05/10/2012  . Chronic pain 05/10/2012  . Diabetes mellitus ([place]) 05/10/2012  . Screening for colon cancer 07/25/2011    Past Surgical History:  Procedure Laterality Date  . CERVICAL DISC SURGERY     X4  . CHOLECYSTECTOMY    . COLONOSCOPY  08/24/2011   Procedure: COLONOSCOPY;  Surgeon: Daneil Dolin, MD;  Location: [facility] ENDO SUITE;  Service: Endoscopy;  Laterality: N/A;  7:30AM  . ELBOW SURGERY     right       Home Medications    Prior to Admission medications   Medication Sig Start Date End Date Taking? Authorizing Provider  albuterol (PROVENTIL HFA;VENTOLIN HFA) 108 (90 BASE) MCG/ACT inhaler Inhale 2 puffs into the lungs every 6 (six) hours as needed. Shortness of breath    Yes Historical Provider, MD  alprazolam Duanne Moron) 2 MG tablet Take 2 mg by mouth at bedtime as needed. sleep   Yes Historical Provider, MD  amLODipine (NORVASC) 10 MG tablet Take 10 mg by mouth daily.   Yes Historical Provider, MD  carbamazepine (TEGRETOL) 200 MG tablet Take 200 mg by mouth 2 (two) times daily.    Yes Historical Provider, MD  cyclobenzaprine (FLEXERIL) 10 MG tablet 1 tablet daily as needed. 07/11/16  Yes Historical Provider, MD  fluticasone (FLONASE) 50 MCG/ACT nasal spray 2 sprays 2 (two) times daily. 07/11/16  Yes Historical Provider, MD  lisinopril (PRINIVIL,ZESTRIL) 20 MG tablet  Take 20 mg by mouth 2 (two) times daily.   Yes Historical Provider, MD  naproxen (NAPROSYN) 500 MG tablet Take 500 mg by mouth 2 (two) times daily with a meal.     Yes Historical Provider, MD  OVER THE COUNTER MEDICATION Take 1 capsule by mouth once. CBD   Yes Historical Provider, MD  oxyCODONE (ROXICODONE) 15 MG immediate release tablet Take 15 mg by mouth 4 (four) times daily as needed. Pain   Yes Historical Provider, MD  oxycodone (ROXICODONE) 30 MG immediate release tablet Take 1 tablet by mouth 3 (three) times daily as needed for pain. 10/14/16  Yes  Historical Provider, MD  pantoprazole (PROTONIX) 40 MG tablet 1 tablet daily. 07/11/16  Yes Historical Provider, MD  tamsulosin (FLOMAX) 0.4 MG CAPS capsule 1 capsule daily. 07/11/16  Yes Historical Provider, MD    Family History Family History  Problem Relation Age of Onset  . Hypertension Father   . Heart attack Father   . Colon cancer Neg Hx   . Anesthesia problems Neg Hx   . Hypotension Neg Hx   . Malignant hyperthermia Neg Hx   . Pseudochol deficiency Neg Hx     Social History Social History  Substance Use Topics  . Smoking status: Former Smoker    Packs/day: 1.50    Years: 30.00    Types: Cigarettes    Quit date: 06/24/2011  . Smokeless tobacco: Never Used     Comment: 1-3 cigarettes   . Alcohol use No     Allergies   Patient has no known allergies.   Review of Systems Review of Systems  Eyes: Positive for visual disturbance.  Respiratory: Negative for shortness of breath.   Cardiovascular: Negative for chest pain and leg swelling.  Gastrointestinal: Negative for vomiting.  Musculoskeletal: Positive for neck pain.  Neurological: Positive for dizziness, weakness and headaches.  All other systems reviewed and are negative.    Physical Exam Updated Vital Signs BP 156/93   Pulse 60   Temp 98.1 F (36.7 C)   Resp 16   Ht 5' 6"  (1.676 m)   Wt 183 lb (83 kg)   SpO2 99%   BMI 29.54 kg/m   Physical Exam  Constitutional: He is oriented to person, place, and time. He appears well-developed and well-nourished.  HENT:  Head: Normocephalic and atraumatic.  Right Ear: External ear normal.  Left Ear: External ear normal.  Nose: Nose normal.  mild tenderness over right temple  Eyes: Conjunctivae and EOM are normal. Pupils are equal, round, and reactive to light. Right eye exhibits no discharge. Left eye exhibits no discharge.  Neck: Neck supple.  Mild right lateral neck tenderness without swelling. No carotid bruits.  Cardiovascular: Normal rate, regular  rhythm and normal heart sounds.   Pulmonary/Chest: Effort normal and breath sounds normal.  Abdominal: Soft. There is no tenderness.  Musculoskeletal: He exhibits no edema.  Neurological: He is alert and oriented to person, place, and time.  Cranial nerve 3-12 grossly intact except for subjective for right facial numbness. Mild weakness in the RUE and RLE. Normal finger to nose.   Skin: Skin is warm and dry.  Nursing note and vitals reviewed.    ED Treatments / Results  DIAGNOSTIC STUDIES: Oxygen Saturation is 100% on RA, normal by my interpretation.    COORDINATION OF CARE: 10:48 AM Discussed treatment plan with pt at bedside and pt agreed to plan. I will check the patient's CT head.  Labs (all labs ordered  are listed, but only abnormal results are displayed) Labs Reviewed  COMPREHENSIVE METABOLIC PANEL - Abnormal; Notable for the following:       Result Value   Glucose, Bld 104 (*)    All other components within normal limits  URINALYSIS, ROUTINE W REFLEX MICROSCOPIC - Abnormal; Notable for the following:    Color, Urine STRAW (*)    Specific Gravity, Urine 1.004 (*)    All other components within normal limits  ETHANOL  PROTIME-INR  APTT  CBC  DIFFERENTIAL  RAPID URINE DRUG SCREEN, HOSP PERFORMED  SEDIMENTATION RATE  C-REACTIVE PROTEIN  I-STAT TROPOININ, ED    EKG  EKG Interpretation  Date/Time:  Wednesday October 26 2016 10:33:25 EST Ventricular Rate:  81 PR Interval:    QRS Duration: 98 QT Interval:  381 QTC Calculation: 443 R Axis:   38 Text Interpretation:  Sinus rhythm Left atrial enlargement no significant change since Nov 2017 Confirmed by Regenia Skeeter MD, Teresa Lemmerman [phone]) on 10/26/2016 10:49:53 AM       Radiology Ct Angio Head W Or Wo Contrast  Result Date: 10/26/2016 CLINICAL DATA:  56 year old male with right side headache neck pain blurred vision and weakness since 1130 hours yesterday. Initial encounter. EXAM: CT ANGIOGRAPHY HEAD AND NECK TECHNIQUE:  Multidetector CT imaging of the head and neck was performed using the standard protocol during bolus administration of intravenous contrast. Multiplanar CT image reconstructions and MIPs were obtained to evaluate the vascular anatomy. Carotid stenosis measurements (when applicable) are obtained utilizing NASCET criteria, using the distal internal carotid diameter as the denominator. CONTRAST:  100 mL Isovue 370 COMPARISON:  Head CT without contrast 05/10/2012. Cervical spine MRI 03/18/2011. Brain MRI 05/01/2006 FINDINGS: CT HEAD Brain: Cerebral volume is stable and within normal limits. No midline shift, ventriculomegaly, mass effect, evidence of mass lesion, intracranial hemorrhage or evidence of cortically based acute infarction. Gray-white matter differentiation is within normal limits throughout the brain. No cortical encephalomalacia identified. Calvarium and skull base: Negative. No acute osseous abnormality identified. Paranasal sinuses: Visualized paranasal sinuses and mastoids are stable and well pneumatized. Orbits: Stable orbit and scalp soft tissues. CTA NECK Skeleton: Extensive previous cervical spine fusion. C3-C4 and C5-C6 ACDF hardware remains in place. No acute osseous abnormality identified. Absent maxillary dentition. Upper chest: Up to 10 mm lateral left upper lobe subpleural lung nodules (series 8, image 6). Negative visible right lung parenchyma. No superior mediastinal or visible hilar lymphadenopathy. Other neck: Thyroid, larynx, pharynx, parapharyngeal spaces, retropharyngeal space, sublingual space, submandibular glands and parotid glands are within normal limits. No cervical lymphadenopathy. Aortic arch: 4 vessel arch configuration, the left vertebral artery arises directly from the arch. Minimal arch atherosclerosis. No great vessel origin stenosis. Right carotid system: No brachiocephalic or right CCA stenosis with mild peripheral soft plaque. Mild soft and minimal calcified plaque at  the right carotid bifurcation with no stenosis. Negative cervical right ICA otherwise. Left carotid system: No left CCA stenosis. Low-density plaque in the posterior and medial left ICA origin and bulb (series 8, image 84) without associated stenosis. Negative cervical left ICA otherwise. Vertebral arteries: No proximal right subclavian artery stenosis. There is mild soft plaque in the right V1 segment with about 50% stenosis (series 8, image 41) of the proximal right vertebral artery. The right vertebral artery is mildly dominant, and otherwise negative to the skullbase. The left vertebral artery arises directly from the arch and is mildly non dominant. No left vertebral artery stenosis to the skullbase. CTA HEAD Posterior circulation: Dominant distal  right vertebral artery with no stenosis. Both PICA origins are patent. The non dominant left vertebral artery is diminutive beyond the PICA. No basilar artery stenosis. SCA origins are normal. Fetal type bilateral PCA origins. Bilateral PCA branches are within normal limits. Anterior circulation: Both ICA siphons are patent, the right appears mildly dominant. No siphon plaque or stenosis identified. Normal ophthalmic and posterior communicating artery origins. Normal carotid termini, MCA and ACA origins. Anterior communicating artery and bilateral ACA branches are normal. Left MCA M1 segment, trifurcation, and left MCA branches are normal. Right MCA M1 segment, bifurcation, and right MCA branches appear normal. Venous sinuses: Patent. Anatomic variants: Left vertebral artery arises directly from the arch. Mildly dominant right vertebral artery. Fetal type bilateral PCA origins. Delayed phase: No abnormal enhancement identified. Review of the MIP images confirms the above findings IMPRESSION: 1. Low-density soft plaque at the left ICA origin, but no associated stenosis. Moderate stenosis of the dominant right vertebral artery V1 segment due to soft plaque. 2. Otherwise  negative Neck CTA, with otherwise minimal atherosclerosis in the neck. And Negative intracranial CTA. 3. Stable and normal CT appearance of the brain. 4. Partially visible subpleural left upper lobe lung nodules up to 10 mm. Recommend follow-up chest CT without contrast now to fully characterize the lungs and provide disposition for these nodules. 5. No acute findings in the neck. Previous extensive cervical spine fusion. Electronically Signed   By: Genevie Ann M.D.   On: 10/26/2016 13:15   Ct Angio Neck W And/or Wo Contrast  Result Date: 10/26/2016 CLINICAL DATA:  56 year old male with right side headache neck pain blurred vision and weakness since 1130 hours yesterday. Initial encounter. EXAM: CT ANGIOGRAPHY HEAD AND NECK TECHNIQUE: Multidetector CT imaging of the head and neck was performed using the standard protocol during bolus administration of intravenous contrast. Multiplanar CT image reconstructions and MIPs were obtained to evaluate the vascular anatomy. Carotid stenosis measurements (when applicable) are obtained utilizing NASCET criteria, using the distal internal carotid diameter as the denominator. CONTRAST:  100 mL Isovue 370 COMPARISON:  Head CT without contrast 05/10/2012. Cervical spine MRI 03/18/2011. Brain MRI 05/01/2006 FINDINGS: CT HEAD Brain: Cerebral volume is stable and within normal limits. No midline shift, ventriculomegaly, mass effect, evidence of mass lesion, intracranial hemorrhage or evidence of cortically based acute infarction. Gray-white matter differentiation is within normal limits throughout the brain. No cortical encephalomalacia identified. Calvarium and skull base: Negative. No acute osseous abnormality identified. Paranasal sinuses: Visualized paranasal sinuses and mastoids are stable and well pneumatized. Orbits: Stable orbit and scalp soft tissues. CTA NECK Skeleton: Extensive previous cervical spine fusion. C3-C4 and C5-C6 ACDF hardware remains in place. No acute osseous  abnormality identified. Absent maxillary dentition. Upper chest: Up to 10 mm lateral left upper lobe subpleural lung nodules (series 8, image 6). Negative visible right lung parenchyma. No superior mediastinal or visible hilar lymphadenopathy. Other neck: Thyroid, larynx, pharynx, parapharyngeal spaces, retropharyngeal space, sublingual space, submandibular glands and parotid glands are within normal limits. No cervical lymphadenopathy. Aortic arch: 4 vessel arch configuration, the left vertebral artery arises directly from the arch. Minimal arch atherosclerosis. No great vessel origin stenosis. Right carotid system: No brachiocephalic or right CCA stenosis with mild peripheral soft plaque. Mild soft and minimal calcified plaque at the right carotid bifurcation with no stenosis. Negative cervical right ICA otherwise. Left carotid system: No left CCA stenosis. Low-density plaque in the posterior and medial left ICA origin and bulb (series 8, image 84) without associated stenosis.  Negative cervical left ICA otherwise. Vertebral arteries: No proximal right subclavian artery stenosis. There is mild soft plaque in the right V1 segment with about 50% stenosis (series 8, image 41) of the proximal right vertebral artery. The right vertebral artery is mildly dominant, and otherwise negative to the skullbase. The left vertebral artery arises directly from the arch and is mildly non dominant. No left vertebral artery stenosis to the skullbase. CTA HEAD Posterior circulation: Dominant distal right vertebral artery with no stenosis. Both PICA origins are patent. The non dominant left vertebral artery is diminutive beyond the PICA. No basilar artery stenosis. SCA origins are normal. Fetal type bilateral PCA origins. Bilateral PCA branches are within normal limits. Anterior circulation: Both ICA siphons are patent, the right appears mildly dominant. No siphon plaque or stenosis identified. Normal ophthalmic and posterior  communicating artery origins. Normal carotid termini, MCA and ACA origins. Anterior communicating artery and bilateral ACA branches are normal. Left MCA M1 segment, trifurcation, and left MCA branches are normal. Right MCA M1 segment, bifurcation, and right MCA branches appear normal. Venous sinuses: Patent. Anatomic variants: Left vertebral artery arises directly from the arch. Mildly dominant right vertebral artery. Fetal type bilateral PCA origins. Delayed phase: No abnormal enhancement identified. Review of the MIP images confirms the above findings IMPRESSION: 1. Low-density soft plaque at the left ICA origin, but no associated stenosis. Moderate stenosis of the dominant right vertebral artery V1 segment due to soft plaque. 2. Otherwise negative Neck CTA, with otherwise minimal atherosclerosis in the neck. And Negative intracranial CTA. 3. Stable and normal CT appearance of the brain. 4. Partially visible subpleural left upper lobe lung nodules up to 10 mm. Recommend follow-up chest CT without contrast now to fully characterize the lungs and provide disposition for these nodules. 5. No acute findings in the neck. Previous extensive cervical spine fusion. Electronically Signed   By: Genevie Ann M.D.   On: 10/26/2016 13:15    Procedures Procedures (including critical care time)  Medications Ordered in ED Medications  sodium chloride 0.9 % bolus 1,000 mL (0 mLs Intravenous Stopped 10/26/16 1249)  metoCLOPramide (REGLAN) injection 10 mg (10 mg Intravenous Given 10/26/16 1123)  diphenhydrAMINE (BENADRYL) injection 25 mg (25 mg Intravenous Given 10/26/16 1122)  iopamidol (ISOVUE-370) 76 % injection 100 mL (100 mLs Intravenous Contrast Given 10/26/16 1234)     Initial Impression / Assessment and Plan / ED Course  I have reviewed the triage vital signs and the nursing notes.  Pertinent labs & imaging results that were available during my care of the patient were reviewed by me and considered in my medical  decision making (see chart for details).  Clinical Course as of Oct 27 1443  Wed Oct 26, 2016  1104 Given neck pain/HA will start with CT angio head/neck. Slight RUE/RLE weakness although with right sided headache this is not very consistent with CVA.Symptoms started yesterday, not a code stroke. Will treat headache  [SG]    Clinical Course User Index [SG] Sherwood Gambler, MD    Patient's headache is improved, and also blurry vision a little better. ESR 4, highly doubt temporal arteritis. Still weakness on RUE/RLE compared to left, although is mild but noticeable. Given left carotid plaque seen he will need MRI and stroke workup. Although this wouldn't explain right sided headache with right sided weakness. D/w Dr. [name], recommends stroke workup. Hospitalist to admit.  Final Clinical Impressions(s) / ED Diagnoses   Final diagnoses:  Right sided weakness    New  Prescriptions New Prescriptions   No medications on file   I personally performed the services described in this documentation, which was scribed in my presence. The recorded information has been reviewed and is accurate.    Sherwood Gambler, MD 10/26/16 1447

## 2016-10-27 ENCOUNTER — Inpatient Hospital Stay (HOSPITAL_COMMUNITY): Payer: Medicare Other

## 2016-10-27 DIAGNOSIS — R918 Other nonspecific abnormal finding of lung field: Secondary | ICD-10-CM

## 2016-10-27 DIAGNOSIS — R569 Unspecified convulsions: Secondary | ICD-10-CM

## 2016-10-27 DIAGNOSIS — I10 Essential (primary) hypertension: Secondary | ICD-10-CM

## 2016-10-27 DIAGNOSIS — H538 Other visual disturbances: Secondary | ICD-10-CM

## 2016-10-27 DIAGNOSIS — G459 Transient cerebral ischemic attack, unspecified: Secondary | ICD-10-CM

## 2016-10-27 LAB — ECHOCARDIOGRAM COMPLETE
AV Area VTI: 2.06 cm2
AV Mean grad: 5 mmHg
AV Peak grad: 11 mmHg
AV area mean vel ind: 0.94 cm2/m2
AV vel: 1.58
AVAREAMEANV: 1.87 cm2
AVAREAVTIIND: 0.79 cm2/m2
AVCELMEANRAT: 0.74
AVLVOTPG: 8 mmHg
AVPKVEL: 169 cm/s
Ao pk vel: 0.81 m/s
CHL CUP AV PEAK INDEX: 1.03
CHL CUP AV VALUE AREA INDEX: 0.79
CHL CUP DOP CALC LVOT VTI: 26.8 cm
DOP CAL AO MEAN VELOCITY: 107 cm/s
EERAT: 11.62
EWDT: 232 ms
FS: 41 % (ref 28–44)
Height: 66 in
IVS/LV PW RATIO, ED: 0.88
LA diam index: 1.61 cm/m2
LA vol A4C: 56.6 ml
LA vol index: 25.4 mL/m2
LASIZE: 32 mm
LAVOL: 50.5 mL
LDCA: 2.54 cm2
LEFT ATRIUM END SYS DIAM: 32 mm
LV E/e' medial: 11.62
LV E/e'average: 11.62
LV PW d: 10.7 mm — AB (ref 0.6–1.1)
LV SIMPSON'S DISK: 57
LV dias vol index: 35 mL/m2
LV dias vol: 69 mL (ref 62–150)
LV e' LATERAL: 11.1 cm/s
LV sys vol index: 15 mL/m2
LV sys vol: 30 mL (ref 21–61)
LVOT SV: 68 mL
LVOT diameter: 18 mm
LVOT peak VTI: 0.62 cm
LVOTPV: 137 cm/s
MV Dec: 232
MVPG: 7 mmHg
MVPKAVEL: 88.1 m/s
MVPKEVEL: 129 m/s
RV LATERAL S' VELOCITY: 15.1 cm/s
Stroke v: 40 ml
TAPSE: 25.8 mm
TDI e' lateral: 11.1
TDI e' medial: 10
VTI: 43 cm
Valve area: 1.58 cm2
WEIGHTICAEL: 2931 [oz_av]

## 2016-10-27 LAB — LIPID PANEL
CHOL/HDL RATIO: 4.3 ratio
Cholesterol: 171 mg/dL (ref 0–200)
HDL: 40 mg/dL — ABNORMAL LOW (ref >40)
LDL CALC: 107 mg/dL — AB (ref 0–99)
Triglycerides: 118 mg/dL (ref <150)
VLDL: 24 mg/dL (ref 0–40)

## 2016-10-27 MED ORDER — ATORVASTATIN CALCIUM 20 MG PO TABS
20.0000 mg | ORAL_TABLET | Freq: Every day | ORAL | Status: DC
  Administered 2016-10-27: 20 mg via ORAL
  Filled 2016-10-27: qty 1

## 2016-10-27 MED ORDER — LISINOPRIL 10 MG PO TABS
20.0000 mg | ORAL_TABLET | Freq: Two times a day (BID) | ORAL | Status: DC
  Administered 2016-10-27 – 2016-10-28 (×3): 20 mg via ORAL
  Filled 2016-10-27 (×3): qty 2

## 2016-10-27 MED ORDER — AMLODIPINE BESYLATE 5 MG PO TABS
10.0000 mg | ORAL_TABLET | Freq: Every day | ORAL | Status: DC
  Administered 2016-10-27 – 2016-10-28 (×2): 10 mg via ORAL
  Filled 2016-10-27 (×2): qty 2

## 2016-10-27 MED ORDER — ALBUTEROL SULFATE (2.5 MG/3ML) 0.083% IN NEBU
2.5000 mg | INHALATION_SOLUTION | Freq: Three times a day (TID) | RESPIRATORY_TRACT | Status: DC
  Administered 2016-10-27 – 2016-10-28 (×3): 2.5 mg via RESPIRATORY_TRACT
  Filled 2016-10-27 (×3): qty 3

## 2016-10-27 NOTE — Progress Notes (Signed)
Patient ambulated around unit twice with SBA. Gait steady. Patient tolerated well until he returned to room and c/o dizziness and being very hot. VS per flow sheet. Room temperature adjusted. Patient sitting on edge of bed. Denies needs at this time.

## 2016-10-27 NOTE — Care Management Note (Signed)
Case Management Note  Patient Details  Name: [name] [name] MRN: [id number] Date of Birth: [birth year]  Subjective/Objective:                  Pt admitted with HTN and r/o CVA. Pt is from home, He is ind with ADL's. He has PCP, transportation to appointments and no difficulty affording or managing medications. He plans to return home with self care.   Action/Plan: No CM needs anticipated.   Expected Discharge Date:  10/28/16               Expected Discharge Plan:  Home/Self Care  In-House Referral:  NA  Discharge planning Services  CM Consult  Post Acute Care Choice:  NA Choice offered to:  NA  Status of Service:  Completed, signed off  Sherald Barge, RN 10/27/2016, 10:15 AM

## 2016-10-27 NOTE — Progress Notes (Signed)
PROGRESS NOTE  [name] [name] NTZ:[id number] DOB: 04-14-1961 DOA: 10/26/2016 PCP: [name], MD  HPI/Recap of past 24 hours:  Report feeling dizzy, speech is clear Wife in room  Assessment/Plan: Active Problems:   HTN (hypertension)   Blurry vision   Slurred speech   TIA (transient ischemic attack)   56 y.o. male with PMH of COPD, HTN, Seizures, Anxiety, Cervical fusion, DJD presented with sudden onset of right sided neck pain associated with headache with right sided numbness, mild weakness, blurry vision and slurred speech  Headache, dizziness, Blurry vision and slurred speech associated with right sided weakness.   MRI brain: No acute or significant intracranial finding. Few punctate white matter foci, typical for age. 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 This could be a cause of right-sided neck pain and headache. CTA headneck: "1. Low-density soft plaque at the left ICA origin, but no associated stenosis. Moderate stenosis of the dominant right vertebral artery V1 segment due to soft plaque. 2. Otherwise negative Neck CTA, with otherwise minimal atherosclerosis in the neck. And Negative intracranial CTA. 3. Stable and normal CT appearance of the brain."  Esr/crp wnl, ldl 107, a1c pending, Neurology consulted, partial seizure? EEG? May need ophthalmology /ENT follow up    H/o Seizure.  Cont home regimen tegretol , report last seizure was many years ago.    HTN. On amlodipine, lisinopril was held initially due to probable acute stroke work up. Mri negative for stroke, resume home bp meds.  Left upper lobe Lung nodule: incidental findings on CT: chest CT without contrast: "11 mm lobulated subpleural nodule along the lateral left upper lobe." Case discussed with pulmonology Dr [name] who recommended outpatient follow up with him for PET/CT scan and then possible biopsy.   COPD.  No s/s of exacerbation.  Chronic pain. H/o C  spine fusion. On prn oxy at home.      Code Status: full  Family Communication: d/w patient, and his wife at bedside Disposition Plan: home 24-48 hrs  with neurology clearance   Consultants:  Neurology Dr [name]  Procedures:  MRI brain  CTA head/neck  Antibiotics:  none   Objective: BP (!) 158/68 (BP Location: Right Arm)   Pulse 73   Temp 98.6 F (37 C) (Oral)   Resp 20   Ht _0  (1.676 m)   Wt 83.1 kg (183 lb 3 oz)   SpO2 96%   BMI 29.57 kg/m   Intake/Output Summary (Last 24 hours) at 10/27/16 1557 Last data filed at 10/27/16 1500  Gross per 24 hour  Intake           2717.5 ml  Output              950 ml  Net           1767.5 ml   Filed Weights   10/26/16 1024 10/26/16 1733  Weight: 83 kg (183 lb) 83.1 kg (183 lb 3 oz)    Exam:   General:  NAD, no nystagmus   Cardiovascular: RRR  Respiratory: CTABL  Abdomen: Soft/ND/NT, positive BS  Musculoskeletal: No Edema  Neuro: aaox3, no focal weakness  Data Reviewed: Basic Metabolic Panel:  Recent Labs Lab 10/26/16 1113  NA 137  K 4.5  CL 103  CO2 26  GLUCOSE 104*  BUN 12  CREATININE 0.97  CALCIUM 9.4   Liver Function Tests:  Recent Labs Lab 10/26/16 1113  AST 19  ALT 24  ALKPHOS 73  BILITOT 0.9  PROT 7.2  ALBUMIN 4.3   No results for input(s): LIPASE, AMYLASE in the last 168 hours. No results for input(s): AMMONIA in the last 168 hours. CBC:  Recent Labs Lab 10/26/16 1113  WBC 6.4  NEUTROABS 4.1  HGB 15.9  HCT 45.2  MCV 88.6  PLT 201   Cardiac Enzymes:   No results for input(s): CKTOTAL, CKMB, CKMBINDEX, TROPONINI in the last 168 hours. BNP (last 3 results)  Recent Labs  08/11/16 0912  BNP 41.0    ProBNP (last 3 results) No results for input(s): PROBNP in the last 8760 hours.  CBG: No results for input(s): GLUCAP in the last 168 hours.  No results found for this or any previous visit (from the past 240 hour(s)).   Studies: Dg Chest 2  View  Result Date: 10/26/2016 CLINICAL DATA:  Slurred speech EXAM: CHEST  2 VIEW COMPARISON:  08/11/2016 FINDINGS: Heart and mediastinal contours are within normal limits. No focal opacities or effusions. No acute bony abnormality. IMPRESSION: No active cardiopulmonary disease. Electronically Signed   By: Rolm Baptise M.D.   On: 10/26/2016 16:11   Ct Chest Wo Contrast  Result Date: 10/26/2016 CLINICAL DATA:  Left upper lobe nodule on CTA head/neck EXAM: CT CHEST WITHOUT CONTRAST TECHNIQUE: Multidetector CT imaging of the chest was performed following the standard protocol without IV contrast. COMPARISON:  Chest radiographs dated 10/26/2016 FINDINGS: Cardiovascular: The heart is normal in size. No pericardial effusion. No evidence of thoracic aortic aneurysm. Mediastinum/Nodes: Small mediastinal lymph nodes which do not meet pathologic CT size criteria. Visualized thyroid is unremarkable. Lungs/Pleura: Lobulated, bilobed subpleural nodule along the lateral left upper lobe (series 4/images 63-67), measuring up to 11 mm in maximal dimension. The lungs are otherwise clear. No focal consolidation. No pleural effusion or pneumothorax. Upper Abdomen: Visualized upper abdomen is notable for cholecystectomy clips. Musculoskeletal: Degenerative changes of the visualized thoracolumbar spine. IMPRESSION: 11 mm lobulated subpleural nodule along the lateral left upper lobe. Consider one of the following in 3 months for both low-risk and high-risk individuals: (a) repeat chest CT, (b) follow-up PET-CT, or (c) tissue sampling. This recommendation follows the consensus statement: Guidelines for Management of Incidental Pulmonary Nodules Detected on CT Images: From the Fleischner Society 2017; Radiology 2017; 284:228-243. Electronically Signed   By: Julian Hy M.D.   On: 10/26/2016 17:47   Mr Brain Wo Contrast  Result Date: 10/26/2016 CLINICAL DATA:  Blurred vision in the right eye. Right-sided neck pain and  headache. Abnormal CTA. EXAM: MRI HEAD WITHOUT CONTRAST TECHNIQUE: Multiplanar, multiecho pulse sequences of the brain and surrounding structures were obtained without intravenous contrast. COMPARISON:  CT study same day.  MRI 05/01/2006 FINDINGS: Brain: Diffusion imaging does not show any acute or subacute infarction. The brainstem and cerebellum are normal. Cerebral hemispheres show minimal small vessel change of the white matter, typical for age. No cortical or large vessel territory infarction. No mass lesion, hemorrhage, hydrocephalus or extra-axial collection. Lateral ventricular right-to-left asymmetry is a normal variant. Vascular: Major vessels at the base of the brain show flow. Skull and upper cervical spine: No calvarial abnormality seen. Arthropathy at the C0-C1 articulation on the right with joint effusion. This could be a cause of right-sided neck pain and headache. Sinuses/Orbits: No significant sinus disease. Previous intra-ocular lens implant on the right. Other: None significant IMPRESSION: No acute or significant intracranial finding. Few punctate white matter foci, typical for age. 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 This  could be a cause of right-sided neck pain and headache. Electronically Signed   By: Nelson Chimes M.D.   On: 10/26/2016 16:50    Scheduled Meds: . albuterol  2.5 mg Nebulization TID  . amLODipine  10 mg Oral Daily  . aspirin  300 mg Rectal Daily   Or  . aspirin  325 mg Oral Daily  . carbamazepine  200 mg Oral BID  . enoxaparin (LOVENOX) injection  40 mg Subcutaneous Q24H  . fluticasone  2 spray Each Nare BID  . lisinopril  20 mg Oral BID  . pantoprazole  40 mg Oral Daily  . tamsulosin  0.4 mg Oral Daily    Continuous Infusions: . sodium chloride 75 mL/hr at 10/26/16 1802     Time spent: 6mns  Aiven Kampe MD, PhD  [facility] Pager [phone] If 7PM-7AM, please contact night-coverage at [email or web address], password  [facility]2/15/2018, 3:57 PM  LOS: 1 day

## 2016-10-27 NOTE — Progress Notes (Signed)
*  PRELIMINARY RESULTS* Echocardiogram 2D Echocardiogram has been performed.  [name] [name] 10/27/2016, 10:25 AM

## 2016-10-27 NOTE — Progress Notes (Signed)
Patient ambulated around unit once SBA, sat down for a couple minutes for RN to silence an IV pump, then ambulated around unit two more times. Patient tolerated well. Patient denies dizziness since last time ambulating around unit before midnight. Patient feels like symptoms are improving, except new tingling feeling to the top of his head. Will continue to monitor.

## 2016-10-27 NOTE — Progress Notes (Signed)
PT Cancellation Note  Patient Details Name: NATNAEL BIEDERMAN MRN: [id number] DOB: [birth year]   Cancelled Treatment:    Reason Eval/Treat Not Completed: PT screened, no needs identified, will sign off (Pt reports that he has been ambulating independently in the room to the bathroom, and he has already ambulated several times around the unit.  He expressed that his mobility is at baseline. Therefore, no skilled PT needs, will sign off. )   Beth Rhylan Gross, PT, DPT X: [phone]

## 2016-10-27 NOTE — Progress Notes (Signed)
SLP Cancellation Note  Patient Details Name: [name] [name] MRN: [id number] DOB: Nov 16, 1960   Cancelled treatment:       Reason Eval/Treat Not Completed: SLP screened, no needs identified, will sign off. Pt and family report no acute changes in swallowing, speech, language, or cognition.  Thank you,  Genene Churn, [place]  [place] 10/27/2016, 5:04 PM

## 2016-10-27 NOTE — Evaluation (Signed)
Occupational Therapy Evaluation Patient Details Name: [name] [name] MRN: [id number] DOB: [birth year] Today's Date: 10/27/2016    History of Present Illness [name] [name] is a 56 y.o. male with PMH of COPD, HTN, Seizures, Anxiety, Cervical fusion, DJD presented with sudden onset of right sided neck pain associated with headache yesterday morning around 11.00 AM. He reports right sided numbness, mild weakness, blurry vision and slurred speech during this episode. Headache is resolved after 20 minutes. However, he was still experiencing mild blurry vision, mild generalized weakness and presented today for evaluation. He states that he had some dizziness, no vertigo. He reports chronic mild paresthesia and residual weakness from old c spine surgery.   Clinical Impression   Pt received in bed, oriented x4, agreeable to OT evaluation. Pt reports continued HA, vision is slightly improved compared to arrival. During evaluation pt demonstrates mod independence with ADL completion and functional mobility tasks. Pt with slight deficits in RUE strength however is [facility]; pt does have hx of RUE surgeries. Sensation and coordination are intact. Pt wife is available 24/7 for any assistance required, no further OT services required at this time as pt is at his baseline with functional task completion.     Follow Up Recommendations  No OT follow up    Equipment Recommendations  None recommended by OT       Precautions / Restrictions Precautions Precautions: None Restrictions Weight Bearing Restrictions: No      Mobility Bed Mobility Overal bed mobility: Modified Independent                Transfers Overall transfer level: Modified independent Equipment used: None             General transfer comment: Pt uses cane at baseline due to 4 neck surgeries         ADL Overall ADL's : Modified independent Eating/Feeding: Modified independent;Bed level   Grooming: Wash/dry  hands;Modified independent;Standing                   Toilet Transfer: Modified Building services engineer and Hygiene: Modified independent;Sit to/from stand       Functional mobility during ADLs: Modified independent       Vision Vision Assessment?: Yes Eye Alignment: Within Functional Limits Ocular Range of Motion: Within Functional Limits Alignment/Gaze Preference: Within Defined Limits Tracking/Visual Pursuits: Able to track stimulus in all quads without difficulty Saccades: Within functional limits Convergence: Within functional limits Visual Fields: No apparent deficits          Pertinent Vitals/Pain Pain Assessment: 0-10 Pain Score: 5  Pain Location: headache Pain Descriptors / Indicators: Headache Pain Intervention(s): Limited activity within patient's tolerance;Premedicated before session     Hand Dominance Right   Extremity/Trunk Assessment Upper Extremity Assessment Upper Extremity Assessment: RUE deficits/detail RUE Deficits / Details: RUE slightly weaker than LUE; hx of sx on RUE limiting ROM at baseline. Overall strength is WFL, sensation and coordination are intact   Lower Extremity Assessment Lower Extremity Assessment: Defer to PT evaluation   Cervical / Trunk Assessment Cervical / Trunk Assessment: Normal   Communication Communication Communication: No difficulties   Cognition Arousal/Alertness: Awake/alert Behavior During Therapy: WFL for tasks assessed/performed Overall Cognitive Status: Within Functional Limits for tasks assessed                                Home Living Family/patient expects to be discharged  to:: Private residence Living Arrangements: Spouse/significant other Available Help at Discharge: Family;Available 24 hours/day Type of Home: House Home Access: Stairs to enter [employer] of Steps: 2 Entrance Stairs-Rails: None Home Layout: One level      Bathroom Shower/Tub: Occupational psychologist: Standard     Home Equipment: Cane - single point;Shower seat - built in          Prior Functioning/Environment Level of Independence: Independent with assistive device(s)        Comments: Uses cane         OT Problem List: Other (comment) (visual deficits)    End of Session Equipment Utilized During Treatment: Gait belt  Activity Tolerance: Patient tolerated treatment well Patient left: in bed;with call bell/phone within reach;with family/visitor present   Time: 6629-4765 OT Time Calculation (min): 20 min Charges:  OT General Charges $OT Visit: 1 Procedure OT Evaluation $OT Eval Low Complexity: 1 Procedure Guadelupe Sabin, OTR/L  [phone] 10/27/2016, 9:45 AM

## 2016-10-28 DIAGNOSIS — R51 Headache: Secondary | ICD-10-CM | POA: Diagnosis not present

## 2016-10-28 DIAGNOSIS — M542 Cervicalgia: Secondary | ICD-10-CM | POA: Diagnosis not present

## 2016-10-28 DIAGNOSIS — R203 Hyperesthesia: Secondary | ICD-10-CM | POA: Diagnosis not present

## 2016-10-28 LAB — HEMOGLOBIN A1C
HEMOGLOBIN A1C: 5.5 % (ref 4.8–5.6)
MEAN PLASMA GLUCOSE: 111 mg/dL

## 2016-10-28 MED ORDER — ASPIRIN EC 81 MG PO TBEC: 81.0000 mg | DELAYED_RELEASE_TABLET | Freq: Every day | ORAL | 0 refills | Status: DC

## 2016-10-28 MED ORDER — ATORVASTATIN CALCIUM 20 MG PO TABS: 20.0000 mg | ORAL_TABLET | Freq: Every day | ORAL | 0 refills | Status: DC

## 2016-10-28 MED ORDER — MECLIZINE HCL 12.5 MG PO TABS: 12.5000 mg | ORAL_TABLET | Freq: Three times a day (TID) | ORAL | 0 refills | Status: DC | PRN

## 2016-10-28 NOTE — Discharge Summary (Signed)
Discharge Summary  [name] [name] UUV:[id number] DOB: [birth year]  PCP: [name] [name]., MD  Admit date: 10/26/2016 Discharge date: 10/28/2016  Time spent: 11mns  Recommendations for Outpatient Follow-up:  1. F/u with PMD within a week  for hospital discharge follow up, repeat cbc/bmp at follow up 2. F/u with neurology Dr DMerlene Laughter3. F/u with spine surgeon Dr [name] 4. F/u with Dr hLuan Pullingfor lung nodule  Discharge Diagnoses:  Active Hospital Problems   Diagnosis Date Noted  . Blurry vision 10/26/2016  . Slurred speech 10/26/2016  . TIA (transient ischemic attack) 10/26/2016  . HTN (hypertension) 05/10/2012    Resolved Hospital Problems   Diagnosis Date Noted Date Resolved  No resolved problems to display.    Discharge Condition: stable  Diet recommendation: heart healthy  Filed Weights   10/26/16 1024 10/26/16 1733  Weight: 83 kg (183 lb) 83.1 kg (183 lb 3 oz)    History of present illness:  PCP: [name] Herring, MD  Specialists:   Chief Complaint: right side neckpain, blurry vision   HPI: [name] PENDERis a 56y.o. male with PMH of COPD, HTN, Seizures, Anxiety, Cervical fusion, DJD presented with sudden onset of right sided neck pain associated with headache yesterday morning around 11.00 AM. He reports right sided numbness, mild weakness, blurry vision and slurred speech during this episode. Headache is resolved after 20 minutes. However, he was still experiencing mild blurry vision, mild generalized weakness and presented today for evaluation. He states that he had some dizziness, no vertigo. He reports chronic mild paresthesia and residual weakness from old c spine surgery. He denies seizures, no recent head or neck trauma. No fevers, no chest pains, no gi symptoms.  -ED: CTA neck was negative for dissection.  hospitalist is asked for admission stroke work up   Review of Systems: The patient denies anorexia, fever, weight loss,, vision loss, decreased  hearing, hoarseness, chest pain, syncope, dyspnea on exertion, peripheral edema, balance deficits, hemoptysis, abdominal pain, melena, hematochezia, severe indigestion/heartburn, hematuria, incontinence, genital sores, muscle weakness, suspicious skin lesions, transient blindness, difficulty walking, depression, unusual weight change, abnormal bleeding, enlarged lymph nodes, angioedema, and breast masses.   Hospital Course:  Active Problems:   HTN (hypertension)   Blurry vision   Slurred speech   TIA (transient ischemic attack)   56y.o. malewith PMH of COPD, HTN, Seizures, Anxiety, Cervical fusion, DJD presented with sudden onset of right sided neck pain associated with headache with right sided numbness, mild weakness, blurry vision and slurred speech  Headache, dizziness, Blurry vision and slurred speechassociated with right sided weakness. Symptom has much improved at discharge, he ambulated with steady gain, no speech problems. He report some times he took oxycodone from neck pain, symptom from oxycodone?   MRI [name]: No acute or significant intracranial finding. Few punctate white matter foci, typical for age. 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 This could be a cause of right-sided neck pain and headache. CTA headneck: "1. Low-density soft plaque at the left ICA origin, but no associated stenosis. Moderate stenosis of the dominant right vertebral artery V1 segment due to soft plaque. 2. Otherwise negative Neck CTA, with otherwise minimal atherosclerosis in the neck. And Negative intracranial CTA. 3. Stable and normal CT appearance of the [name]."  Esr/crp wnl, ldl 107, a1c 5.5, Neurology consulted, he is cleared by neurology to discharge home and follow up with neurology outpatient.    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 He is to follow with spine surgery Dr [name].  H/o Seizure.  Cont home regimen tegretol ,  report last seizure was many years ago.    HTN. On amlodipine, lisinopril was held initially due to probable acute stroke work up. Mri negative for stroke, resume home bp meds. Started on asa  HLD; started on statin  Left upper lobe Lung nodule: incidental findings on CT: chest CT without contrast: "11 mm lobulated subpleural nodule along the lateral left upper lobe." Case discussed with pulmonology Dr [name] who recommended outpatient follow up with him for PET/CT scan and then possible biopsy.   COPD. No s/s of exacerbation.  Chronic pain. H/o C spine fusion. On prn oxy at home.      Code Status:full Family Communication:d/w patient, and his wife at bedside Disposition Plan:home on 2/16 with neurology clearance   Consultants:  Neurology Dr [name]  Procedures:  MRI [name]  CTA head/neck  Antibiotics:  none   Discharge Exam: BP (!) 145/80 (BP Location: Right Arm)   Pulse 69   Temp 97.7 F (36.5 C) (Oral)   Resp 18   Ht 5' 6"  (1.676 m)   Wt 83.1 kg (183 lb 3 oz)   SpO2 98%   BMI 29.57 kg/m   General: NAD Cardiovascular: RRR Respiratory: CTABL  Discharge Instructions You were cared for by a hospitalist during your hospital stay. If you have any questions about your discharge medications or the care you received while you were in the hospital after you are discharged, you can call the unit and asked to speak with the hospitalist on call if the hospitalist that took care of you is not available. Once you are discharged, your primary care physician will handle any further medical issues. Please note that NO REFILLS for any discharge medications will be authorized once you are discharged, as it is imperative that you return to your primary care physician (or establish a relationship with a primary care physician if you do not have one) for your aftercare needs so that they can reassess your need for medications and monitor your lab  values.  Discharge Instructions    Diet - low sodium heart healthy    Complete by:  As directed    Increase activity slowly    Complete by:  As directed      Allergies as of 10/28/2016   No Known Allergies     Medication List    TAKE these medications   albuterol 108 (90 Base) MCG/ACT inhaler Commonly known as:  PROVENTIL HFA;VENTOLIN HFA Inhale 2 puffs into the lungs every 6 (six) hours as needed. Shortness of breath   alprazolam 2 MG tablet Commonly known as:  XANAX Take 2 mg by mouth at bedtime as needed. sleep   amLODipine 10 MG tablet Commonly known as:  NORVASC Take 10 mg by mouth daily.   aspirin EC 81 MG tablet Take 1 tablet (81 mg total) by mouth daily.   atorvastatin 20 MG tablet Commonly known as:  LIPITOR Take 1 tablet (20 mg total) by mouth daily at 6 PM.   carbamazepine 200 MG tablet Commonly known as:  TEGRETOL Take 200 mg by mouth 2 (two) times daily.   cyclobenzaprine 10 MG tablet Commonly known as:  FLEXERIL 1 tablet daily as needed.   fluticasone 50 MCG/ACT nasal spray Commonly known as:  FLONASE 2 sprays 2 (two) times daily.   lisinopril 20 MG tablet Commonly known as:  PRINIVIL,ZESTRIL Take 20  mg by mouth 2 (two) times daily.   meclizine 12.5 MG tablet Commonly known as:  ANTIVERT Take 1 tablet (12.5 mg total) by mouth 3 (three) times daily as needed for dizziness.   naproxen 500 MG tablet Commonly known as:  NAPROSYN Take 500 mg by mouth 2 (two) times daily with a meal.   OVER THE COUNTER MEDICATION Take 1 capsule by mouth once. CBD   oxyCODONE 15 MG immediate release tablet Commonly known as:  ROXICODONE Take 15 mg by mouth 4 (four) times daily as needed. Pain What changed:  Another medication with the same name was removed. Continue taking this medication, and follow the directions you see here.   pantoprazole 40 MG tablet Commonly known as:  PROTONIX 1 tablet daily.   tamsulosin 0.4 MG Caps capsule Commonly known as:   FLOMAX 1 capsule daily.      No Known Allergies Follow-up Information    HAWKINS,EDWARD L, MD Follow up in 2 week(s).   Specialty:  Pulmonary Disease Why:  11 mm lobulated subpleural nodule along the lateral left upper lobe. outpatient PET/CT scan and possible biopsy Contact information: [address] [place] [address] [phone]        FUSCO,LAWRENCE J., MD Follow up in 1 week(s).   Specialty:  Internal Medicine Why:  hospital discharge follow up, consider cardiology referral for heart murmur pmd to refer patient to have outpatient Physical therapy Contact information: [address] [place] [address] [phone]        Glenna Fellows, MD Follow up in 2 week(s).   Specialty:  Neurosurgery Contact information: [address] [place] [address] [phone]            The results of significant diagnostics from this hospitalization (including imaging, microbiology, ancillary and laboratory) are listed below for reference.    Significant Diagnostic Studies: Ct Angio Head W Or Wo Contrast  Result Date: 10/26/2016 CLINICAL DATA:  56 year old male with right side headache neck pain blurred vision and weakness since 1130 hours yesterday. Initial encounter. EXAM: CT ANGIOGRAPHY HEAD AND NECK TECHNIQUE: Multidetector CT imaging of the head and neck was performed using the standard protocol during bolus administration of intravenous contrast. Multiplanar CT image reconstructions and MIPs were obtained to evaluate the vascular anatomy. Carotid stenosis measurements (when applicable) are obtained utilizing NASCET criteria, using the distal internal carotid diameter as the denominator. CONTRAST:  100 mL Isovue 370 COMPARISON:  Head CT without contrast 05/10/2012. Cervical spine MRI 03/18/2011. [name] MRI 05/01/2006 FINDINGS: CT HEAD [name]: Cerebral volume is stable and within normal limits. No midline shift, ventriculomegaly, mass effect,  evidence of mass lesion, intracranial hemorrhage or evidence of cortically based acute infarction. Gray-white matter differentiation is within normal limits throughout the [name]. No cortical encephalomalacia identified. Calvarium and skull base: Negative. No acute osseous abnormality identified. Paranasal sinuses: Visualized paranasal sinuses and mastoids are stable and well pneumatized. Orbits: Stable orbit and scalp soft tissues. CTA NECK Skeleton: Extensive previous cervical spine fusion. C3-C4 and C5-C6 ACDF hardware remains in place. No acute osseous abnormality identified. Absent maxillary dentition. Upper chest: Up to 10 mm lateral left upper lobe subpleural lung nodules (series 8, image 6). Negative visible right lung parenchyma. No superior mediastinal or visible hilar lymphadenopathy. Other neck: Thyroid, larynx, pharynx, parapharyngeal spaces, retropharyngeal space, sublingual space, submandibular glands and parotid glands are within normal limits. No cervical lymphadenopathy. Aortic arch: 4 vessel arch configuration, the left vertebral artery arises directly from  the arch. Minimal arch atherosclerosis. No great vessel origin stenosis. Right carotid system: No brachiocephalic or right CCA stenosis with mild peripheral soft plaque. Mild soft and minimal calcified plaque at the right carotid bifurcation with no stenosis. Negative cervical right ICA otherwise. Left carotid system: No left CCA stenosis. Low-density plaque in the posterior and medial left ICA origin and bulb (series 8, image 84) without associated stenosis. Negative cervical left ICA otherwise. Vertebral arteries: No proximal right subclavian artery stenosis. There is mild soft plaque in the right V1 segment with about 50% stenosis (series 8, image 41) of the proximal right vertebral artery. The right vertebral artery is mildly dominant, and otherwise negative to the skullbase. The left vertebral artery arises directly from the arch and is  mildly non dominant. No left vertebral artery stenosis to the skullbase. CTA HEAD Posterior circulation: Dominant distal right vertebral artery with no stenosis. Both PICA origins are patent. The non dominant left vertebral artery is diminutive beyond the PICA. No basilar artery stenosis. SCA origins are normal. Fetal type bilateral PCA origins. Bilateral PCA branches are within normal limits. Anterior circulation: Both ICA siphons are patent, the right appears mildly dominant. No siphon plaque or stenosis identified. Normal ophthalmic and posterior communicating artery origins. Normal carotid termini, MCA and ACA origins. Anterior communicating artery and bilateral ACA branches are normal. Left MCA M1 segment, trifurcation, and left MCA branches are normal. Right MCA M1 segment, bifurcation, and right MCA branches appear normal. Venous sinuses: Patent. Anatomic variants: Left vertebral artery arises directly from the arch. Mildly dominant right vertebral artery. Fetal type bilateral PCA origins. Delayed phase: No abnormal enhancement identified. Review of the MIP images confirms the above findings IMPRESSION: 1. Low-density soft plaque at the left ICA origin, but no associated stenosis. Moderate stenosis of the dominant right vertebral artery V1 segment due to soft plaque. 2. Otherwise negative Neck CTA, with otherwise minimal atherosclerosis in the neck. And Negative intracranial CTA. 3. Stable and normal CT appearance of the [name]. 4. Partially visible subpleural left upper lobe lung nodules up to 10 mm. Recommend follow-up chest CT without contrast now to fully characterize the lungs and provide disposition for these nodules. 5. No acute findings in the neck. Previous extensive cervical spine fusion. Electronically Signed   By: Genevie Ann M.D.   On: 10/26/2016 13:15   Dg Chest 2 View  Result Date: 10/26/2016 CLINICAL DATA:  Slurred speech EXAM: CHEST  2 VIEW COMPARISON:  08/11/2016 FINDINGS: Heart and  mediastinal contours are within normal limits. No focal opacities or effusions. No acute bony abnormality. IMPRESSION: No active cardiopulmonary disease. Electronically Signed   By: Rolm Baptise M.D.   On: 10/26/2016 16:11   Ct Angio Neck W And/or Wo Contrast  Result Date: 10/26/2016 CLINICAL DATA:  57 year old male with right side headache neck pain blurred vision and weakness since 1130 hours yesterday. Initial encounter. EXAM: CT ANGIOGRAPHY HEAD AND NECK TECHNIQUE: Multidetector CT imaging of the head and neck was performed using the standard protocol during bolus administration of intravenous contrast. Multiplanar CT image reconstructions and MIPs were obtained to evaluate the vascular anatomy. Carotid stenosis measurements (when applicable) are obtained utilizing NASCET criteria, using the distal internal carotid diameter as the denominator. CONTRAST:  100 mL Isovue 370 COMPARISON:  Head CT without contrast 05/10/2012. Cervical spine MRI 03/18/2011. [name] MRI 05/01/2006 FINDINGS: CT HEAD [name]: Cerebral volume is stable and within normal limits. No midline shift, ventriculomegaly, mass effect, evidence of mass lesion, intracranial hemorrhage or  evidence of cortically based acute infarction. Gray-white matter differentiation is within normal limits throughout the [name]. No cortical encephalomalacia identified. Calvarium and skull base: Negative. No acute osseous abnormality identified. Paranasal sinuses: Visualized paranasal sinuses and mastoids are stable and well pneumatized. Orbits: Stable orbit and scalp soft tissues. CTA NECK Skeleton: Extensive previous cervical spine fusion. C3-C4 and C5-C6 ACDF hardware remains in place. No acute osseous abnormality identified. Absent maxillary dentition. Upper chest: Up to 10 mm lateral left upper lobe subpleural lung nodules (series 8, image 6). Negative visible right lung parenchyma. No superior mediastinal or visible hilar lymphadenopathy. Other neck: Thyroid,  larynx, pharynx, parapharyngeal spaces, retropharyngeal space, sublingual space, submandibular glands and parotid glands are within normal limits. No cervical lymphadenopathy. Aortic arch: 4 vessel arch configuration, the left vertebral artery arises directly from the arch. Minimal arch atherosclerosis. No great vessel origin stenosis. Right carotid system: No brachiocephalic or right CCA stenosis with mild peripheral soft plaque. Mild soft and minimal calcified plaque at the right carotid bifurcation with no stenosis. Negative cervical right ICA otherwise. Left carotid system: No left CCA stenosis. Low-density plaque in the posterior and medial left ICA origin and bulb (series 8, image 84) without associated stenosis. Negative cervical left ICA otherwise. Vertebral arteries: No proximal right subclavian artery stenosis. There is mild soft plaque in the right V1 segment with about 50% stenosis (series 8, image 41) of the proximal right vertebral artery. The right vertebral artery is mildly dominant, and otherwise negative to the skullbase. The left vertebral artery arises directly from the arch and is mildly non dominant. No left vertebral artery stenosis to the skullbase. CTA HEAD Posterior circulation: Dominant distal right vertebral artery with no stenosis. Both PICA origins are patent. The non dominant left vertebral artery is diminutive beyond the PICA. No basilar artery stenosis. SCA origins are normal. Fetal type bilateral PCA origins. Bilateral PCA branches are within normal limits. Anterior circulation: Both ICA siphons are patent, the right appears mildly dominant. No siphon plaque or stenosis identified. Normal ophthalmic and posterior communicating artery origins. Normal carotid termini, MCA and ACA origins. Anterior communicating artery and bilateral ACA branches are normal. Left MCA M1 segment, trifurcation, and left MCA branches are normal. Right MCA M1 segment, bifurcation, and right MCA branches  appear normal. Venous sinuses: Patent. Anatomic variants: Left vertebral artery arises directly from the arch. Mildly dominant right vertebral artery. Fetal type bilateral PCA origins. Delayed phase: No abnormal enhancement identified. Review of the MIP images confirms the above findings IMPRESSION: 1. Low-density soft plaque at the left ICA origin, but no associated stenosis. Moderate stenosis of the dominant right vertebral artery V1 segment due to soft plaque. 2. Otherwise negative Neck CTA, with otherwise minimal atherosclerosis in the neck. And Negative intracranial CTA. 3. Stable and normal CT appearance of the [name]. 4. Partially visible subpleural left upper lobe lung nodules up to 10 mm. Recommend follow-up chest CT without contrast now to fully characterize the lungs and provide disposition for these nodules. 5. No acute findings in the neck. Previous extensive cervical spine fusion. Electronically Signed   By: Genevie Ann M.D.   On: 10/26/2016 13:15   Ct Chest Wo Contrast  Result Date: 10/26/2016 CLINICAL DATA:  Left upper lobe nodule on CTA head/neck EXAM: CT CHEST WITHOUT CONTRAST TECHNIQUE: Multidetector CT imaging of the chest was performed following the standard protocol without IV contrast. COMPARISON:  Chest radiographs dated 10/26/2016 FINDINGS: Cardiovascular: The heart is normal in size. No pericardial effusion. No evidence  of thoracic aortic aneurysm. Mediastinum/Nodes: Small mediastinal lymph nodes which do not meet pathologic CT size criteria. Visualized thyroid is unremarkable. Lungs/Pleura: Lobulated, bilobed subpleural nodule along the lateral left upper lobe (series 4/images 63-67), measuring up to 11 mm in maximal dimension. The lungs are otherwise clear. No focal consolidation. No pleural effusion or pneumothorax. Upper Abdomen: Visualized upper abdomen is notable for cholecystectomy clips. Musculoskeletal: Degenerative changes of the visualized thoracolumbar spine. IMPRESSION: 11 mm  lobulated subpleural nodule along the lateral left upper lobe. Consider one of the following in 3 months for both low-risk and high-risk individuals: (a) repeat chest CT, (b) follow-up PET-CT, or (c) tissue sampling. This recommendation follows the consensus statement: Guidelines for Management of Incidental Pulmonary Nodules Detected on CT Images: From the Fleischner Society 2017; Radiology 2017; 284:228-243. Electronically Signed   By: Julian Hy M.D.   On: 10/26/2016 17:47   [name] [name] Wo Contrast  Result Date: 10/26/2016 CLINICAL DATA:  Blurred vision in the right eye. Right-sided neck pain and headache. Abnormal CTA. EXAM: MRI HEAD WITHOUT CONTRAST TECHNIQUE: Multiplanar, multiecho pulse sequences of the [name] and surrounding structures were obtained without intravenous contrast. COMPARISON:  CT study same day.  MRI 05/01/2006 FINDINGS: [name]: Diffusion imaging does not show any acute or subacute infarction. The brainstem and cerebellum are normal. Cerebral hemispheres show minimal small vessel change of the white matter, typical for age. No cortical or large vessel territory infarction. No mass lesion, hemorrhage, hydrocephalus or extra-axial collection. Lateral ventricular right-to-left asymmetry is a normal variant. Vascular: Major vessels at the base of the [name] show flow. Skull and upper cervical spine: No calvarial abnormality seen. Arthropathy at the C0-C1 articulation on the right with joint effusion. This could be a cause of right-sided neck pain and headache. Sinuses/Orbits: No significant sinus disease. Previous intra-ocular lens implant on the right. Other: None significant IMPRESSION: No acute or significant intracranial finding. Few punctate white matter foci, typical for age. 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 This could be a cause of right-sided neck pain and headache. Electronically Signed   By: Nelson Chimes M.D.   On: 10/26/2016 16:50     Microbiology: No results found for this or any previous visit (from the past 240 hour(s)).   Labs: Basic Metabolic Panel:  Recent Labs Lab 10/26/16 1113  NA 137  K 4.5  CL 103  CO2 26  GLUCOSE 104*  BUN 12  CREATININE 0.97  CALCIUM 9.4   Liver Function Tests:  Recent Labs Lab 10/26/16 1113  AST 19  ALT 24  ALKPHOS 73  BILITOT 0.9  PROT 7.2  ALBUMIN 4.3   No results for input(s): LIPASE, AMYLASE in the last 168 hours. No results for input(s): AMMONIA in the last 168 hours. CBC:  Recent Labs Lab 10/26/16 1113  WBC 6.4  NEUTROABS 4.1  HGB 15.9  HCT 45.2  MCV 88.6  PLT 201   Cardiac Enzymes: No results for input(s): CKTOTAL, CKMB, CKMBINDEX, TROPONINI in the last 168 hours. BNP: BNP (last 3 results)  Recent Labs  08/11/16 0912  BNP 41.0    ProBNP (last 3 results) No results for input(s): PROBNP in the last 8760 hours.  CBG: No results for input(s): GLUCAP in the last 168 hours.     SignedFlorencia Reasons MD, PhD  [facility] 10/28/2016, 7:42 PM

## 2016-10-28 NOTE — Consult Note (Signed)
[place] A. [name], MD     [email or web address]          [name] [name] is an 56 y.o. male.   ASSESSMENT/PLAN: Unusual spell associated with multiple neurological symptoms of unclear etiology. The differential diagnosis however includes complex partial seizures and complex migraine. TIA is also a possibility. Complex partial seizures seems more likely to me.  RECOMMENDATION: EEG; this can be done as outpatient patient. Continue with aspirin 325. Follow-up in the office in 1 month.    The patient is a 56 year old white male who presents with the acute onset of a pulling like sensation involving the n right neck region and radiating to the right hemicrania region. The patient developed tingling-like sensation involving the face bilaterally and dizziness. Dizziness is described as unsteadiness and do a lightheaded sensation although he reports he may have had some spinning. He denies any dysarthria, dysphagia, heaviness that is focal although he reports having diffuse malaise/fatigue. He denies chest pain, shortness of breath or clear headaches although again he did have the pulling like sensation involving the right hemicrania. The initial symptom lasted for about 30-60 seconds and then resolve but he has been left with some residual symptoms including global weakness and fatigue and dizziness. He has never had any spells like this. He does have a past history of seizures which are somewhat ill-defined. He tells me that he is in the backs out with amnesia although he denies oral trauma, urinary or bladder incontinence, or convulsions. The current event was not associated with any of these symptoms. The review of systems otherwise negative.  GENERAL: This is a pleasant male who is in no acute distress.  HEENT: Supple. Atraumatic normocephalic.   ABDOMEN: soft  EXTREMITIES: No edema   BACK: Normal.  SKIN: Normal by inspection.    MENTAL STATUS: Alert and oriented.  Speech, language and cognition are generally intact. Judgment and insight normal.   CRANIAL NERVES: Pupils are equal, round and reactive to light and accommodation; extra ocular movements are full, there is no significant nystagmus; visual fields are full; upper and lower facial muscles are normal in strength and symmetric, there is no flattening of the nasolabial folds; tongue is midline; uvula is midline; shoulder elevation is normal.  MOTOR: Normal tone, bulk and strength; no pronator drift.  COORDINATION: Left finger to nose is normal, right finger to nose is normal, No rest tremor; no intention tremor; no postural tremor; no bradykinesia.  REFLEXES: Deep tendon reflexes are symmetrical and normal. Babinski reflexes are flexor bilaterally.   SENSATION: Normal to light touch.      Blood pressure (!) 145/80, pulse 69, temperature 97.7 F (36.5 C), temperature source Oral, resp. rate 18, height 5' 6"  (1.676 m), weight 183 lb 3 oz (83.1 kg), SpO2 98 %.  Past Medical History:  Diagnosis Date  . Anxiety   . COPD (chronic obstructive pulmonary disease) ([place])   . DDD (degenerative disc disease)   . GERD (gastroesophageal reflux disease)   . HTN (hypertension)   . Seizures ([place])    last seizure 7 yrs ago-unknown etiology    Past Surgical History:  Procedure Laterality Date  . CERVICAL DISC SURGERY     X4  . CHOLECYSTECTOMY    . COLONOSCOPY  08/24/2011   Procedure: COLONOSCOPY;  Surgeon: Daneil Dolin, MD;  Location: [facility];  Service: Endoscopy;  Laterality: N/A;  7:30AM  . ELBOW SURGERY     right    Family History  Problem Relation Age of Onset  . Hypertension Father   . Heart attack Father   . Colon cancer Neg Hx   . Anesthesia problems Neg Hx   . Hypotension Neg Hx   . Malignant hyperthermia Neg Hx   . Pseudochol deficiency Neg Hx     Social History:  reports that he quit smoking about 5 years ago. His smoking use included Cigarettes. He has a 45.00 pack-year  smoking history. He has never used smokeless tobacco. He reports that he does not drink alcohol or use drugs.  Allergies: No Known Allergies  Medications: Prior to Admission medications   Medication Sig Start Date End Date Taking? Authorizing Provider  albuterol (PROVENTIL HFA;VENTOLIN HFA) 108 (90 BASE) MCG/ACT inhaler Inhale 2 puffs into the lungs every 6 (six) hours as needed. Shortness of breath    Yes Historical Provider, MD  alprazolam Duanne Moron) 2 MG tablet Take 2 mg by mouth at bedtime as needed. sleep   Yes Historical Provider, MD  amLODipine (NORVASC) 10 MG tablet Take 10 mg by mouth daily.   Yes Historical Provider, MD  carbamazepine (TEGRETOL) 200 MG tablet Take 200 mg by mouth 2 (two) times daily.    Yes Historical Provider, MD  cyclobenzaprine (FLEXERIL) 10 MG tablet 1 tablet daily as needed. 07/11/16  Yes Historical Provider, MD  fluticasone (FLONASE) 50 MCG/ACT nasal spray 2 sprays 2 (two) times daily. 07/11/16  Yes Historical Provider, MD  lisinopril (PRINIVIL,ZESTRIL) 20 MG tablet Take 20 mg by mouth 2 (two) times daily.   Yes Historical Provider, MD  naproxen (NAPROSYN) 500 MG tablet Take 500 mg by mouth 2 (two) times daily with a meal.     Yes Historical Provider, MD  OVER THE COUNTER MEDICATION Take 1 capsule by mouth once. CBD   Yes Historical Provider, MD  oxyCODONE (ROXICODONE) 15 MG immediate release tablet Take 15 mg by mouth 4 (four) times daily as needed. Pain   Yes Historical Provider, MD  oxycodone (ROXICODONE) 30 MG immediate release tablet Take 1 tablet by mouth 3 (three) times daily as needed for pain. 10/14/16  Yes Historical Provider, MD  pantoprazole (PROTONIX) 40 MG tablet 1 tablet daily. 07/11/16  Yes Historical Provider, MD  tamsulosin (FLOMAX) 0.4 MG CAPS capsule 1 capsule daily. 07/11/16  Yes Historical Provider, MD    Scheduled Meds: . albuterol  2.5 mg Nebulization TID  . amLODipine  10 mg Oral Daily  . aspirin  300 mg Rectal Daily   Or  . aspirin  325  mg Oral Daily  . atorvastatin  20 mg Oral q1800  . carbamazepine  200 mg Oral BID  . enoxaparin (LOVENOX) injection  40 mg Subcutaneous Q24H  . fluticasone  2 spray Each Nare BID  . lisinopril  20 mg Oral BID  . pantoprazole  40 mg Oral Daily  . tamsulosin  0.4 mg Oral Daily   Continuous Infusions: . sodium chloride 75 mL/hr at 10/27/16 2120   PRN Meds:.acetaminophen **OR** acetaminophen (TYLENOL) oral liquid 160 mg/5 mL **OR** acetaminophen, alprazolam, cyclobenzaprine, hydrALAZINE, oxycodone, senna-docusate     Results for orders placed or performed during the hospital encounter of 10/26/16 (from the past 48 hour(s))  Urine rapid drug screen (hosp performed)not at [facility]     Status: None   Collection Time: 10/26/16 11:04 AM  Result Value Ref Range   Opiates NONE DETECTED NONE DETECTED   Cocaine NONE DETECTED NONE DETECTED   Benzodiazepines NONE DETECTED NONE DETECTED   Amphetamines NONE DETECTED  NONE DETECTED   Tetrahydrocannabinol NONE DETECTED NONE DETECTED   Barbiturates NONE DETECTED NONE DETECTED    Comment:        DRUG SCREEN FOR MEDICAL PURPOSES ONLY.  IF CONFIRMATION IS NEEDED FOR ANY PURPOSE, NOTIFY LAB WITHIN 5 DAYS.        LOWEST DETECTABLE LIMITS FOR URINE DRUG SCREEN Drug Class       Cutoff (ng/mL) Amphetamine      1000 Barbiturate      200 Benzodiazepine   161 Tricyclics       096 Opiates          300 Cocaine          300 THC              50   Urinalysis, Routine w reflex microscopic     Status: Abnormal   Collection Time: 10/26/16 11:04 AM  Result Value Ref Range   Color, Urine STRAW (A) YELLOW   APPearance CLEAR CLEAR   Specific Gravity, Urine 1.004 (L) 1.005 - 1.030   pH 6.0 5.0 - 8.0   Glucose, UA NEGATIVE NEGATIVE mg/dL   Hgb urine dipstick NEGATIVE NEGATIVE   Bilirubin Urine NEGATIVE NEGATIVE   Ketones, ur NEGATIVE NEGATIVE mg/dL   Protein, ur NEGATIVE NEGATIVE mg/dL   Nitrite NEGATIVE NEGATIVE   Leukocytes, UA NEGATIVE NEGATIVE    C-reactive protein     Status: None   Collection Time: 10/26/16 11:10 AM  Result Value Ref Range   CRP <0.8 <1.0 mg/dL    Comment: Performed at [facility] [address], [place], [place] [address]  Ethanol     Status: None   Collection Time: 10/26/16 11:13 AM  Result Value Ref Range   Alcohol, Ethyl (B) <5 <5 mg/dL    Comment:        LOWEST DETECTABLE LIMIT FOR SERUM ALCOHOL IS 5 mg/dL FOR MEDICAL PURPOSES ONLY   Protime-INR     Status: None   Collection Time: 10/26/16 11:13 AM  Result Value Ref Range   Prothrombin Time 13.1 11.4 - 15.2 seconds   INR 0.99   APTT     Status: None   Collection Time: 10/26/16 11:13 AM  Result Value Ref Range   aPTT 31 24 - 36 seconds  CBC     Status: None   Collection Time: 10/26/16 11:13 AM  Result Value Ref Range   WBC 6.4 4.0 - 10.5 K/uL   RBC 5.10 4.22 - 5.81 MIL/uL   Hemoglobin 15.9 13.0 - 17.0 g/dL   HCT 45.2 39.0 - 52.0 %   MCV 88.6 78.0 - 100.0 fL   MCH 31.2 26.0 - 34.0 pg   MCHC 35.2 30.0 - 36.0 g/dL   RDW 12.4 11.5 - 15.5 %   Platelets 201 150 - 400 K/uL  Differential     Status: None   Collection Time: 10/26/16 11:13 AM  Result Value Ref Range   Neutrophils Relative % 64 %   Neutro Abs 4.1 1.7 - 7.7 K/uL   Lymphocytes Relative 27 %   Lymphs Abs 1.8 0.7 - 4.0 K/uL   Monocytes Relative 7 %   Monocytes Absolute 0.4 0.1 - 1.0 K/uL   Eosinophils Relative 1 %   Eosinophils Absolute 0.1 0.0 - 0.7 K/uL   Basophils Relative 1 %   Basophils Absolute 0.0 0.0 - 0.1 K/uL  Comprehensive metabolic panel     Status: Abnormal   Collection Time: 10/26/16 11:13 AM  Result  Value Ref Range   Sodium 137 135 - 145 mmol/L   Potassium 4.5 3.5 - 5.1 mmol/L   Chloride 103 101 - 111 mmol/L   CO2 26 22 - 32 mmol/L   Glucose, Bld 104 (H) 65 - 99 mg/dL   BUN 12 6 - 20 mg/dL   Creatinine, Ser 0.97 0.61 - 1.24 mg/dL   Calcium 9.4 8.9 - 10.3 mg/dL   Total Protein 7.2 6.5 - 8.1 g/dL   Albumin 4.3 3.5 - 5.0 g/dL   AST 19 15 - 41 U/L    ALT 24 17 - 63 U/L   Alkaline Phosphatase 73 38 - 126 U/L   Total Bilirubin 0.9 0.3 - 1.2 mg/dL   GFR calc non Af Amer >60 >60 mL/min   GFR calc Af Amer >60 >60 mL/min    Comment: (NOTE) The eGFR has been calculated using the CKD EPI equation. This calculation has not been validated in all clinical situations. eGFR's persistently <60 mL/min signify possible Chronic Kidney Disease.    Anion gap 8 5 - 15  Sedimentation rate     Status: None   Collection Time: 10/26/16 11:13 AM  Result Value Ref Range   Sed Rate 4 0 - 16 mm/hr  Hemoglobin A1c     Status: None   Collection Time: 10/26/16 11:13 AM  Result Value Ref Range   Hgb A1c MFr Bld 5.5 4.8 - 5.6 %    Comment: (NOTE)         Pre-diabetes: 5.7 - 6.4         Diabetes: >6.4         Glycemic control for adults with diabetes: <7.0    Mean Plasma Glucose 111 mg/dL    Comment: (NOTE) Performed At: [facility] [address] [place], [place] [id number] Lindon Romp MD [place]:[id number]   I-stat troponin, ED (not at [facility])     Status: None   Collection Time: 10/26/16 11:26 AM  Result Value Ref Range   Troponin i, poc 0.00 0.00 - 0.08 ng/mL   Comment 3            Comment: Due to the release kinetics of cTnI, a negative result within the first hours of the onset of symptoms does not rule out myocardial infarction with certainty. If myocardial infarction is still suspected, repeat the test at appropriate intervals.   Lipid panel     Status: Abnormal   Collection Time: 10/27/16  6:33 AM  Result Value Ref Range   Cholesterol 171 0 - 200 mg/dL   Triglycerides 118 <150 mg/dL   HDL 40 (L) >40 mg/dL   Total CHOL/HDL Ratio 4.3 RATIO   VLDL 24 0 - 40 mg/dL   LDL Cholesterol 107 (H) 0 - 99 mg/dL    Comment:        Total Cholesterol/HDL:CHD Risk Coronary Heart Disease Risk Table                     Men   Women  1/2 Average Risk   3.4   3.3  Average Risk       5.0   4.4  2 X Average Risk   9.6   7.1  3 X Average Risk   23.4   11.0        Use the calculated Patient Ratio above and the CHD Risk Table to determine the patient's CHD Risk.        ATP III CLASSIFICATION (  LDL):  <100     mg/dL   Optimal  100-129  mg/dL   Near or Above                    Optimal  130-159  mg/dL   Borderline  160-189  mg/dL   High  >190     mg/dL   Very High     Studies/Results:  HEAD NECK CTA FINDINGS: CT HEAD  Brain: Cerebral volume is stable and within normal limits. No midline shift, ventriculomegaly, mass effect, evidence of mass lesion, intracranial hemorrhage or evidence of cortically based acute infarction. Gray-white matter differentiation is within normal limits throughout the brain. No cortical encephalomalacia identified.  Calvarium and skull base: Negative. No acute osseous abnormality identified.  Paranasal sinuses: Visualized paranasal sinuses and mastoids are stable and well pneumatized.  Orbits: Stable orbit and scalp soft tissues.  CTA NECK  Skeleton: Extensive previous cervical spine fusion. C3-C4 and C5-C6 ACDF hardware remains in place. No acute osseous abnormality identified. Absent maxillary dentition.  Upper chest: Up to 10 mm lateral left upper lobe subpleural lung nodules (series 8, image 6). Negative visible right lung parenchyma. No superior mediastinal or visible hilar lymphadenopathy.  Other neck: Thyroid, larynx, pharynx, parapharyngeal spaces, retropharyngeal space, sublingual space, submandibular glands and parotid glands are within normal limits. No cervical lymphadenopathy.  Aortic arch: 4 vessel arch configuration, the left vertebral artery arises directly from the arch. Minimal arch atherosclerosis. No great vessel origin stenosis.  Right carotid system: No brachiocephalic or right CCA stenosis with mild peripheral soft plaque. Mild soft and minimal calcified plaque at the right carotid bifurcation with no stenosis. Negative cervical right ICA  otherwise.  Left carotid system: No left CCA stenosis. Low-density plaque in the posterior and medial left ICA origin and bulb (series 8, image 84) without associated stenosis. Negative cervical left ICA otherwise.  Vertebral arteries:  No proximal right subclavian artery stenosis. There is mild soft plaque in the right V1 segment with about 50% stenosis (series 8, image 41) of the proximal right vertebral artery. The right vertebral artery is mildly dominant, and otherwise negative to the skullbase.  The left vertebral artery arises directly from the arch and is mildly non dominant. No left vertebral artery stenosis to the skullbase.  CTA HEAD  Posterior circulation: Dominant distal right vertebral artery with no stenosis. Both PICA origins are patent. The non dominant left vertebral artery is diminutive beyond the PICA. No basilar artery stenosis. SCA origins are normal. Fetal type bilateral PCA origins. Bilateral PCA branches are within normal limits.  Anterior circulation: Both ICA siphons are patent, the right appears mildly dominant. No siphon plaque or stenosis identified. Normal ophthalmic and posterior communicating artery origins. Normal carotid termini, MCA and ACA origins. Anterior communicating artery and bilateral ACA branches are normal. Left MCA M1 segment, trifurcation, and left MCA branches are normal. Right MCA M1 segment, bifurcation, and right MCA branches appear normal.  Venous sinuses: Patent.  Anatomic variants: Left vertebral artery arises directly from the arch. Mildly dominant right vertebral artery. Fetal type bilateral PCA origins.  Delayed phase: No abnormal enhancement identified.  Review of the MIP images confirms the above findings  IMPRESSION: 1. Low-density soft plaque at the left ICA origin, but no associated stenosis. Moderate stenosis of the dominant right vertebral artery V1 segment due to soft plaque. 2. Otherwise  negative Neck CTA, with otherwise minimal atherosclerosis in the neck. And Negative intracranial CTA. 3. Stable and normal CT  appearance of the brain. 4. Partially visible subpleural left upper lobe lung nodules up to 10 mm. Recommend follow-up chest CT without contrast now to fully characterize the lungs and provide disposition for these nodules. 5. No acute findings in the neck. Previous extensive cervical spine Fusion.     CHEST CTA 11 mm lobulated subpleural nodule along the lateral left upper lobe.  Consider one of the following in 3 months for both low-risk and high-risk individuals: (a) repeat chest CT, (b) follow-up PET-CT, or (c) tissue sampling.        BRAIN MRI FINDINGS: Brain: Diffusion imaging does not show any acute or subacute infarction. The brainstem and cerebellum are normal. Cerebral hemispheres show minimal small vessel change of the white matter, typical for age. No cortical or large vessel territory infarction. No mass lesion, hemorrhage, hydrocephalus or extra-axial collection. Lateral ventricular right-to-left asymmetry is a normal variant.  Vascular: Major vessels at the base of the brain show flow.  Skull and upper cervical spine: No calvarial abnormality seen. Arthropathy at the C0-C1 articulation on the right with joint effusion. This could be a cause of right-sided neck pain and headache.  Sinuses/Orbits: No significant sinus disease. Previous intra-ocular lens implant on the right.  Other: None significant  IMPRESSION: No acute or significant intracranial finding. Few punctate white matter foci, typical for age.  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 This could be a cause of right-sided neck pain and headache.      The brain MRI is reviewed in person. There are no abnormalities seen on SWI. There are a couple tiny increased signal seen on FLAIR imaging. No hemorrhage. No abnormalities on  T1.        Kyley Laurel A. [name], M.D.  Diplomate, Tax adviser of Psychiatry and Neurology ( Neurology). 10/28/2016, 8:38 AM

## 2016-10-28 NOTE — Care Management Important Message (Signed)
Important Message  Patient Details  Name: [name] [name] MRN: [id number] Date of Birth: Aug 01, 1961   Medicare Important Message Given:  Yes    Sherald Barge, RN 10/28/2016, 1:05 PM

## 2016-10-28 NOTE — Evaluation (Signed)
Physical Therapy Evaluation Patient Details Name: [name] [name] MRN: [id number] DOB: 10-Mar-1961 Today's Date: 10/28/2016   History of Present Illness  [name] [name] is a 56 y.o. male with PMH of COPD, HTN, Seizures, Anxiety, Cervical fusion, DJD, and surgery on R eye with 2 artifical lenses, presented with sudden onset of right sided neck pain associated with headache yesterday morning around 11.00 AM. He reports right sided numbness, mild weakness, blurry vision and slurred speech during this episode. Headache is resolved after 20 minutes. However, he was still experiencing mild blurry vision, mild generalized weakness and presented today for evaluation. He states that he had some dizziness, no vertigo. He reports chronic mild paresthesia and residual weakness from old c spine surgery.  Clinical Impression  Pt received in bed, and was agreeable to PT evaluation.  He states that he occasionally uses his cane for ambulation at home, and he is independent with dressing and bathing.  He reports that he has had 3-4 falls in the past 6 months, but does not remember what happens when he falls.  During PT evaluation orthostatic vitas were obtained and were as follows:   Supine BP: 159/67, HR: 88bpm Sitting BP: 179/71, HR: 85bpm Standing BP: 164/81, HR: 92bpm After ambulation BP: 168/80, HR: 92bpm  During evaluation, pt expressed dizziness which he described as light headedness.  This worsened with head turns during gait as well as turning the corner where he had to stop and hold onto the wall rail at that point.  He demonstrates scissoring during gait at some points as well.  Recommend that pt f/u with OPPT for further balance re-training.     Follow Up Recommendations Outpatient PT    Equipment Recommendations  None recommended by PT    Recommendations for Other Services       Precautions / Restrictions Precautions Precautions: Fall Precaution Comments: 3-4 falls in the past 6 months.    Restrictions Weight Bearing Restrictions: No      Mobility  Bed Mobility Overal bed mobility: Modified Independent                Transfers Overall transfer level: Modified independent                  Ambulation/Gait Ambulation/Gait assistance: Modified independent (Device/Increase time) Ambulation Distance (Feet): 400 Feet Assistive device: None Gait Pattern/deviations: Scissoring;Staggering right;Staggering left     General Gait Details: Pt c/o feeling dizzy - clarified as feeling light headed - especially when he turns his head during ambulation  Stairs            Wheelchair Mobility    Modified Rankin (Stroke Patients Only)       Balance Overall balance assessment: History of Falls                               Standardized Balance Assessment Standardized Balance Assessment : Berg Balance Test Berg Balance Test Sit to Stand: Able to stand without using hands and stabilize independently Standing Unsupported: Able to stand safely 2 minutes Sitting with Back Unsupported but Feet Supported on Floor or Stool: Able to sit safely and securely 2 minutes Stand to Sit: Sits safely with minimal use of hands Transfers: Able to transfer safely, minor use of hands Standing Unsupported with Eyes Closed: Able to stand 10 seconds with supervision Standing Ubsupported with Feet Together: Able to place feet together independently and stand 1 minute safely From  Standing, Reach Forward with Outstretched Arm: Can reach confidently >25 cm (10") From Standing Position, Pick up Object from Floor: Able to pick up shoe safely and easily From Standing Position, Turn to Look Behind Over each Shoulder: Looks behind from both sides and weight shifts well Turn 360 Degrees: Able to turn 360 degrees safely in 4 seconds or less Standing Unsupported, Alternately Place Feet on Step/Stool: Able to stand independently and complete 8 steps >20 seconds Standing  Unsupported, One Foot in Front: Able to plae foot ahead of the other independently and hold 30 seconds Standing on One Leg: Able to lift leg independently and hold > 10 seconds Total Score: 53         Pertinent Vitals/Pain Pain Assessment: 0-10 Pain Score: 5  Pain Location: Head, neck, and back.  - previous surgeries on neck and disk in back that is bad.  Pain goes at an angle from R temple over to the left frontal area.  Pain Descriptors / Indicators: Burning Pain Intervention(s): Limited activity within patient's tolerance;Monitored during session;Repositioned    Home Living   Living Arrangements: Spouse/significant other Available Help at Discharge: Family;Available 24 hours/day Type of Home: House Home Access: Stairs to enter   [employer] of Steps: 2 Home Layout: One level Home Equipment: Cane - single point;Shower seat - built in      Prior Function Level of Independence: Independent with assistive device(s)   Gait / Transfers Assistance Needed: Occasionally uses his cane for ambulation.  Pt states he is trying to stay away from using it so he doesn't become dependent on it.  Still driving.   ADL's / Homemaking Assistance Needed: independent        Hand Dominance   Dominant Hand: Right    Extremity/Trunk Assessment   Upper Extremity Assessment Upper Extremity Assessment: Overall WFL for tasks assessed    Lower Extremity Assessment Lower Extremity Assessment: Overall WFL for tasks assessed       Communication   Communication: No difficulties  Cognition Arousal/Alertness: Awake/alert Behavior During Therapy: WFL for tasks assessed/performed Overall Cognitive Status: Within Functional Limits for tasks assessed                      General Comments      Exercises     Assessment/Plan    PT Assessment All further PT needs can be met in the next venue of care  PT Problem List Decreased balance          PT Treatment Interventions  Balance training;Gait training;Neuromuscular re-education    PT Goals (Current goals can be found in the Care Plan section)  Acute Rehab PT Goals PT Goal Formulation: All assessment and education complete, DC therapy    Frequency     Barriers to discharge        Co-evaluation               End of Session Equipment Utilized During Treatment: Gait belt Activity Tolerance: Patient tolerated treatment well Patient left:  (up in the room. ) Nurse Communication: Mobility status Margreta Journey, RN notified of pt's mobility status. )    Functional Assessment Tool Used: [employer] "6-clicks"  Functional Limitation: Mobility: Walking and moving around Mobility: Walking and Moving Around Current Status [phone]): At least 1 percent but less than 20 percent impaired, limited or restricted Mobility: Walking and Moving Around Goal Status [phone]): At least 1 percent but less than 20 percent impaired, limited or restricted Mobility: Walking  and Moving Around Discharge Status [phone]): At least 1 percent but less than 20 percent impaired, limited or restricted    Time: 1017-1050 PT Time Calculation ([name]) (ACUTE ONLY): 33 [name]   Charges:   PT Evaluation $PT Eval Low Complexity: 1 Procedure PT Treatments $Gait Training: 8-22 mins   PT G Codes:   PT G-Codes **NOT FOR INPATIENT CLASS** Functional Assessment Tool Used: [employer] "6-clicks"  Functional Limitation: Mobility: Walking and moving around Mobility: Walking and Moving Around Current Status [phone]): At least 1 percent but less than 20 percent impaired, limited or restricted Mobility: Walking and Moving Around Goal Status [phone]): At least 1 percent but less than 20 percent impaired, limited or restricted Mobility: Walking and Moving Around Discharge Status [phone]): At least 1 percent but less than 20 percent impaired, limited or restricted   Beth Keaton Beichner, PT, DPT X: [phone]

## 2016-10-28 NOTE — Care Management (Signed)
PT has recommended OP PT. Pt is undecided if he wants OP PT and where he would want to receive OP PT. Pt planned for DC today and pt instructed to discuss OP PT referral further with PCP at follow up.

## 2016-10-28 NOTE — Progress Notes (Signed)
Patient with orders to be discharge home. Discharge instructions given, patient verbalized understanding. Patient stable. Patient left in private vehicle with family.  

## 2016-10-29 DIAGNOSIS — M542 Cervicalgia: Secondary | ICD-10-CM | POA: Diagnosis not present

## 2016-10-29 DIAGNOSIS — R203 Hyperesthesia: Secondary | ICD-10-CM | POA: Diagnosis not present

## 2016-10-29 DIAGNOSIS — R51 Headache: Secondary | ICD-10-CM | POA: Diagnosis not present

## 2016-11-01 DIAGNOSIS — G500 Trigeminal neuralgia: Secondary | ICD-10-CM | POA: Diagnosis not present

## 2016-11-01 DIAGNOSIS — M4302 Spondylolysis, cervical region: Secondary | ICD-10-CM | POA: Diagnosis not present

## 2016-11-04 ENCOUNTER — Other Ambulatory Visit (HOSPITAL_COMMUNITY): Payer: Self-pay | Admitting: Pulmonary Disease

## 2016-11-04 DIAGNOSIS — J449 Chronic obstructive pulmonary disease, unspecified: Secondary | ICD-10-CM | POA: Diagnosis not present

## 2016-11-04 DIAGNOSIS — R911 Solitary pulmonary nodule: Secondary | ICD-10-CM | POA: Diagnosis not present

## 2016-11-07 ENCOUNTER — Other Ambulatory Visit (HOSPITAL_COMMUNITY): Payer: Self-pay | Admitting: Pulmonary Disease

## 2016-11-07 DIAGNOSIS — R911 Solitary pulmonary nodule: Secondary | ICD-10-CM

## 2016-11-14 ENCOUNTER — Ambulatory Visit (HOSPITAL_COMMUNITY): Payer: Medicare Other

## 2016-11-15 ENCOUNTER — Encounter (HOSPITAL_COMMUNITY): Payer: Self-pay

## 2016-11-15 ENCOUNTER — Ambulatory Visit (HOSPITAL_COMMUNITY): Payer: Medicare HMO

## 2016-11-21 ENCOUNTER — Telehealth: Payer: Self-pay | Admitting: *Deleted

## 2016-11-21 DIAGNOSIS — I10 Essential (primary) hypertension: Secondary | ICD-10-CM | POA: Diagnosis not present

## 2016-11-21 DIAGNOSIS — G894 Chronic pain syndrome: Secondary | ICD-10-CM | POA: Diagnosis not present

## 2016-11-21 DIAGNOSIS — E119 Type 2 diabetes mellitus without complications: Secondary | ICD-10-CM | POA: Diagnosis not present

## 2016-11-21 DIAGNOSIS — G459 Transient cerebral ischemic attack, unspecified: Secondary | ICD-10-CM | POA: Diagnosis not present

## 2016-11-21 DIAGNOSIS — Z6828 Body mass index (BMI) 28.0-28.9, adult: Secondary | ICD-10-CM | POA: Diagnosis not present

## 2016-11-21 DIAGNOSIS — T50905D Adverse effect of unspecified drugs, medicaments and biological substances, subsequent encounter: Secondary | ICD-10-CM | POA: Diagnosis not present

## 2016-11-21 DIAGNOSIS — E663 Overweight: Secondary | ICD-10-CM | POA: Diagnosis not present

## 2016-11-21 DIAGNOSIS — J449 Chronic obstructive pulmonary disease, unspecified: Secondary | ICD-10-CM | POA: Diagnosis not present

## 2016-11-21 DIAGNOSIS — I6523 Occlusion and stenosis of bilateral carotid arteries: Secondary | ICD-10-CM | POA: Diagnosis not present

## 2016-11-22 ENCOUNTER — Ambulatory Visit (INDEPENDENT_AMBULATORY_CARE_PROVIDER_SITE_OTHER): Payer: Medicare HMO | Admitting: Neurology

## 2016-11-22 ENCOUNTER — Ambulatory Visit (HOSPITAL_COMMUNITY): Payer: Medicare Other

## 2016-11-22 DIAGNOSIS — R55 Syncope and collapse: Secondary | ICD-10-CM | POA: Diagnosis not present
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
## 2016-11-28 DIAGNOSIS — M4302 Spondylolysis, cervical region: Secondary | ICD-10-CM | POA: Diagnosis not present

## 2016-11-28 DIAGNOSIS — M47816 Spondylosis without myelopathy or radiculopathy, lumbar region: Secondary | ICD-10-CM | POA: Diagnosis not present

## 2016-11-30 ENCOUNTER — Telehealth: Payer: Self-pay

## 2016-11-30 NOTE — Telephone Encounter (Signed)
SENT NOTES TO SCHEDULING 

## 2016-12-01 ENCOUNTER — Ambulatory Visit (HOSPITAL_COMMUNITY)
Admission: RE | Admit: 2016-12-01 | Discharge: 2016-12-01 | Disposition: A | Discharge status: Home / Self Care | Payer: Medicare HMO | Source: Ambulatory Visit | Attending: Pulmonary Disease | Admitting: Pulmonary Disease

## 2016-12-01 DIAGNOSIS — R911 Solitary pulmonary nodule: Secondary | ICD-10-CM | POA: Diagnosis not present

## 2016-12-01 LAB — GLUCOSE, CAPILLARY: Glucose-Capillary: 113 mg/dL — ABNORMAL HIGH (ref 65–99)

## 2016-12-01 MED ORDER — FLUDEOXYGLUCOSE F - 18 (FDG) INJECTION
9.7400 | Freq: Once | INTRAVENOUS | Status: AC | PRN
  Administered 2016-12-01: 9.74 via INTRAVENOUS

## 2016-12-14 ENCOUNTER — Telehealth: Payer: Self-pay | Admitting: Cardiovascular Disease

## 2016-12-14 NOTE — Telephone Encounter (Signed)
Received records from [facility] for appointment on 01/03/17 with Dr Gwenlyn Found.  Records put with Dr Kennon Holter schedule for 01/03/17. lp

## 2017-01-03 ENCOUNTER — Ambulatory Visit (INDEPENDENT_AMBULATORY_CARE_PROVIDER_SITE_OTHER): Payer: Medicare HMO | Admitting: Cardiovascular Disease

## 2017-01-03 ENCOUNTER — Encounter: Payer: Self-pay | Admitting: Cardiovascular Disease

## 2017-01-03 VITALS — BP 145/86 | HR 68 | Ht 66.5 in | Wt 193.0 lb

## 2017-01-03 DIAGNOSIS — E785 Hyperlipidemia, unspecified: Secondary | ICD-10-CM | POA: Insufficient documentation

## 2017-01-03 DIAGNOSIS — Z8249 Family history of ischemic heart disease and other diseases of the circulatory system: Secondary | ICD-10-CM

## 2017-01-03 DIAGNOSIS — I679 Cerebrovascular disease, unspecified: Secondary | ICD-10-CM | POA: Diagnosis not present

## 2017-01-03 DIAGNOSIS — I10 Essential (primary) hypertension: Secondary | ICD-10-CM | POA: Diagnosis not present

## 2017-01-03 DIAGNOSIS — I2510 Atherosclerotic heart disease of native coronary artery without angina pectoris: Secondary | ICD-10-CM | POA: Diagnosis not present

## 2017-01-03 DIAGNOSIS — I200 Unstable angina: Secondary | ICD-10-CM | POA: Diagnosis not present

## 2017-01-03 DIAGNOSIS — E7800 Pure hypercholesterolemia, unspecified: Secondary | ICD-10-CM | POA: Diagnosis not present

## 2017-01-03 NOTE — Assessment & Plan Note (Signed)
History of essential hypertension on amlodipine and lisinopril. Continue current meds at current dosing

## 2017-01-03 NOTE — Patient Instructions (Signed)
Medication Instructions:   NO CHANGE  Testing/Procedures:  Your physician has requested that you have a lexiscan myoview. For further information please visit [email or web address]. Please follow instruction sheet, as given.    Follow-Up:  Your physician has requested that you have a carotid duplex. This test is an ultrasound of the carotid arteries in your neck. It looks at blood flow through these arteries that supply the brain with blood. Allow one hour for this exam. There are no restrictions or special instructions.SCHEDULE IN 6 MONTHS  Your physician wants you to follow-up in: 6 MONTHS WITH DR Gwenlyn Found AFTER CAROTID COMPLETE You will receive a reminder letter in the mail two months in advance. If you don't receive a letter, please call our office to schedule the follow-up appointment.

## 2017-01-03 NOTE — Progress Notes (Signed)
01/03/2017 [name] [name]   February 17, 1961  [id number]  Primary Physician [name] Herring, MD Primary Cardiologist: [name] Harp MD [name] [name]  HPI:  [name] [name] is a delightful 56 year old mildly overweight married Caucasian male father of 43, grandfather of 6 grandchildren accompanied by his wife [name] [name]. He was referred by [name] [name] for cardiovascular evaluation because of recent TIA and other associated risk factors. He has an 80-pack-year history of tobacco abuse having quit 6 months ago as well as treated hypertension and hyperlipidemia. His father did have a myocardial infarction and stent procedure in his late 61s. The patient has never had a heart attack. Recently was evaluated for TIA. He does get chest pain which has begun (6 months ago characterized as a substernal chest pressure number on by any particular activity.   Current Outpatient Prescriptions  Medication Sig Dispense Refill  . albuterol (PROVENTIL HFA;VENTOLIN HFA) 108 (90 BASE) MCG/ACT inhaler Inhale 2 puffs into the lungs every 6 (six) hours as needed. Shortness of breath     . albuterol (PROVENTIL) (2.5 MG/3ML) 0.083% nebulizer solution Take 2.5 mg by nebulization as directed.    Marland Kitchen alprazolam (XANAX) 2 MG tablet Take 2 mg by mouth at bedtime as needed. sleep    . amLODipine (NORVASC) 10 MG tablet Take 10 mg by mouth daily.    Marland Kitchen aspirin EC 81 MG tablet Take 1 tablet (81 mg total) by mouth daily. 30 tablet 0  . cyclobenzaprine (FLEXERIL) 10 MG tablet 1 tablet daily as needed.    . fluticasone (FLONASE) 50 MCG/ACT nasal spray 2 sprays 2 (two) times daily.    Marland Kitchen levETIRAcetam (KEPPRA) 500 MG tablet Take 1 tablet by mouth daily.    Marland Kitchen lisinopril (PRINIVIL,ZESTRIL) 20 MG tablet Take 20 mg by mouth 2 (two) times daily.    . meclizine (ANTIVERT) 12.5 MG tablet Take 1 tablet (12.5 mg total) by mouth 3 (three) times daily as needed for dizziness. 30 tablet 0  . naproxen (NAPROSYN) 500 MG tablet Take 500 mg by  mouth 2 (two) times daily with a meal.      . OVER THE COUNTER MEDICATION Take 1 capsule by mouth once. CBD    . oxyCODONE (ROXICODONE) 15 MG immediate release tablet Take 15 mg by mouth 4 (four) times daily as needed. Pain    . pantoprazole (PROTONIX) 40 MG tablet 1 tablet daily.    . pravastatin (PRAVACHOL) 20 MG tablet Take 1 tablet by mouth daily.    . tamsulosin (FLOMAX) 0.4 MG CAPS capsule 1 capsule daily.    Marland Kitchen umeclidinium-vilanterol (ANORO ELLIPTA) 62.5-25 MCG/INH AEPB Inhale 1 puff into the lungs daily.     No current facility-administered medications for this visit.     No Known Allergies  Social History   Social History  . Marital status: Married    Spouse name: N/A  . Number of children: N/A  . Years of education: N/A   Occupational History  . disability    Social History Main Topics  . Smoking status: Former Smoker    Packs/day: 1.50    Years: 30.00    Types: Cigarettes    Quit date: 06/24/2011  . Smokeless tobacco: Never Used     Comment: 1-3 cigarettes   . Alcohol use No  . Drug use: No  . Sexual activity: Yes    Birth control/ protection: None   Other Topics Concern  . Not on file   Social History Narrative  .  No narrative on file     Review of Systems: General: negative for chills, fever, night sweats or weight changes.  Cardiovascular: negative for chest pain, dyspnea on exertion, edema, orthopnea, palpitations, paroxysmal nocturnal dyspnea or shortness of breath Dermatological: negative for rash Respiratory: negative for cough or wheezing Urologic: negative for hematuria Abdominal: negative for nausea, vomiting, diarrhea, bright red blood per rectum, melena, or hematemesis Neurologic: negative for visual changes, syncope, or dizziness All other systems reviewed and are otherwise negative except as noted above.    Blood pressure (!) 145/86, pulse 68, height 5' 6.5" (1.689 m), weight 193 lb (87.5 kg), SpO2 98 %.  General appearance: alert and  no distress Neck: no adenopathy, no carotid bruit, no JVD, supple, symmetrical, trachea midline and thyroid not enlarged, symmetric, no tenderness/mass/nodules Lungs: clear to auscultation bilaterally Heart: regular rate and rhythm, S1, S2 normal, no murmur, click, rub or gallop Extremities: extremities normal, atraumatic, no cyanosis or edema  EKG not performed today  ASSESSMENT AND PLAN:   HTN (hypertension) History of essential hypertension on amlodipine and lisinopril. Continue current meds at current dosing  Hyperlipidemia History of mild hyperlipidemia with a total cholesterol 170 and LDL 107 on low-dose statin therapy followed by his PCP  Chest pain Patient complains of 66 month history of chest pressure occurring several times a week lasting several minutes at a time. Does have positive risk factors. He has difficulty walking and walks with a cane because of neck issues. I'm going to get a pharmacology Myoview stress test to further evaluate and risk stratify him.  Cerebrovascular disease [name] [name] was recently admitted with TIA. He received CT angios of his neck that showed dominant right vertebral ostial stenosis but no evidence of ICA stenosis. I'm going to arrange for him to have carotid Dopplers in 6 months.      [name] Harp MD FACP,FACC,FAHA, [facility] 01/03/2017 10:53 AM

## 2017-01-03 NOTE — Assessment & Plan Note (Signed)
History of mild hyperlipidemia with a total cholesterol 170 and LDL 107 on low-dose statin therapy followed by his PCP

## 2017-01-03 NOTE — Assessment & Plan Note (Signed)
Patient complains of 66 month history of chest pressure occurring several times a week lasting several minutes at a time. Does have positive risk factors. He has difficulty walking and walks with a cane because of neck issues. I'm going to get a pharmacology Myoview stress test to further evaluate and risk stratify him.

## 2017-01-03 NOTE — Assessment & Plan Note (Signed)
[name] [name] was recently admitted with TIA. He received CT angios of his neck that showed dominant right vertebral ostial stenosis but no evidence of ICA stenosis. I'm going to arrange for him to have carotid Dopplers in 6 months.

## 2017-01-06 DIAGNOSIS — E1129 Type 2 diabetes mellitus with other diabetic kidney complication: Secondary | ICD-10-CM | POA: Diagnosis not present

## 2017-01-06 DIAGNOSIS — Z1389 Encounter for screening for other disorder: Secondary | ICD-10-CM | POA: Diagnosis not present

## 2017-01-06 DIAGNOSIS — R69 Illness, unspecified: Secondary | ICD-10-CM | POA: Diagnosis not present

## 2017-01-06 DIAGNOSIS — I10 Essential (primary) hypertension: Secondary | ICD-10-CM | POA: Diagnosis not present

## 2017-01-06 DIAGNOSIS — G894 Chronic pain syndrome: Secondary | ICD-10-CM | POA: Diagnosis not present

## 2017-01-06 DIAGNOSIS — Z6829 Body mass index (BMI) 29.0-29.9, adult: Secondary | ICD-10-CM | POA: Diagnosis not present

## 2017-01-06 DIAGNOSIS — E782 Mixed hyperlipidemia: Secondary | ICD-10-CM | POA: Diagnosis not present

## 2017-01-10 ENCOUNTER — Telehealth (HOSPITAL_COMMUNITY): Payer: Self-pay

## 2017-01-10 NOTE — Telephone Encounter (Signed)
Encounter complete. 

## 2017-01-12 ENCOUNTER — Ambulatory Visit (HOSPITAL_COMMUNITY)
Admission: RE | Admit: 2017-01-12 | Discharge: 2017-01-12 | Disposition: A | Discharge status: Home / Self Care | Payer: Medicare HMO | Source: Ambulatory Visit | Attending: Cardiology | Admitting: Cardiology

## 2017-01-12 DIAGNOSIS — Z8249 Family history of ischemic heart disease and other diseases of the circulatory system: Secondary | ICD-10-CM | POA: Insufficient documentation

## 2017-01-12 DIAGNOSIS — Z87891 Personal history of nicotine dependence: Secondary | ICD-10-CM | POA: Diagnosis not present

## 2017-01-12 DIAGNOSIS — I10 Essential (primary) hypertension: Secondary | ICD-10-CM | POA: Diagnosis not present

## 2017-01-12 DIAGNOSIS — J449 Chronic obstructive pulmonary disease, unspecified: Secondary | ICD-10-CM | POA: Diagnosis not present

## 2017-01-12 DIAGNOSIS — I2510 Atherosclerotic heart disease of native coronary artery without angina pectoris: Secondary | ICD-10-CM

## 2017-01-12 DIAGNOSIS — E785 Hyperlipidemia, unspecified: Secondary | ICD-10-CM | POA: Diagnosis not present

## 2017-01-12 DIAGNOSIS — Z8673 Personal history of transient ischemic attack (TIA), and cerebral infarction without residual deficits: Secondary | ICD-10-CM | POA: Diagnosis not present

## 2017-01-12 LAB — MYOCARDIAL PERFUSION IMAGING
CHL CUP RESTING HR STRESS: 54 {beats}/min
CSEPPHR: 101 {beats}/min
LV sys vol: 35 mL
LVDIAVOL: 99 mL (ref 62–150)
SDS: 2
SRS: 0
SSS: 2
TID: 1.01

## 2017-01-12 MED ORDER — TECHNETIUM TC 99M TETROFOSMIN IV KIT
9.9000 | PACK | Freq: Once | INTRAVENOUS | Status: AC | PRN
  Administered 2017-01-12: 9.9 via INTRAVENOUS
  Filled 2017-01-12: qty 10

## 2017-01-12 MED ORDER — REGADENOSON 0.4 MG/5ML IV SOLN
0.4000 mg | Freq: Once | INTRAVENOUS | Status: AC
  Administered 2017-01-12: 0.4 mg via INTRAVENOUS

## 2017-01-12 MED ORDER — TECHNETIUM TC 99M TETROFOSMIN IV KIT
31.2000 | PACK | Freq: Once | INTRAVENOUS | Status: AC | PRN
  Administered 2017-01-12: 31.2 via INTRAVENOUS
  Filled 2017-01-12: qty 32

## 2017-01-12 MED ORDER — AMINOPHYLLINE 25 MG/ML IV SOLN
75.0000 mg | Freq: Once | INTRAVENOUS | Status: AC
  Administered 2017-01-12: 75 mg via INTRAVENOUS

## 2017-01-19 DIAGNOSIS — M5126 Other intervertebral disc displacement, lumbar region: Secondary | ICD-10-CM | POA: Diagnosis not present

## 2017-01-19 DIAGNOSIS — M47816 Spondylosis without myelopathy or radiculopathy, lumbar region: Secondary | ICD-10-CM | POA: Diagnosis not present

## 2017-01-19 DIAGNOSIS — M4302 Spondylolysis, cervical region: Secondary | ICD-10-CM | POA: Diagnosis not present

## 2017-03-20 ENCOUNTER — Other Ambulatory Visit (HOSPITAL_COMMUNITY): Payer: Self-pay | Admitting: Pulmonary Disease

## 2017-03-20 DIAGNOSIS — R911 Solitary pulmonary nodule: Secondary | ICD-10-CM

## 2017-03-31 ENCOUNTER — Ambulatory Visit (HOSPITAL_COMMUNITY): Payer: Medicare HMO

## 2017-03-31 DIAGNOSIS — J449 Chronic obstructive pulmonary disease, unspecified: Secondary | ICD-10-CM | POA: Diagnosis not present

## 2017-03-31 DIAGNOSIS — J984 Other disorders of lung: Secondary | ICD-10-CM | POA: Diagnosis not present

## 2017-03-31 DIAGNOSIS — Z79891 Long term (current) use of opiate analgesic: Secondary | ICD-10-CM | POA: Diagnosis not present

## 2017-03-31 DIAGNOSIS — E119 Type 2 diabetes mellitus without complications: Secondary | ICD-10-CM | POA: Diagnosis not present

## 2017-03-31 DIAGNOSIS — R69 Illness, unspecified: Secondary | ICD-10-CM | POA: Diagnosis not present

## 2017-03-31 DIAGNOSIS — I10 Essential (primary) hypertension: Secondary | ICD-10-CM | POA: Diagnosis not present

## 2017-03-31 DIAGNOSIS — J329 Chronic sinusitis, unspecified: Secondary | ICD-10-CM | POA: Diagnosis not present

## 2017-03-31 DIAGNOSIS — Z6828 Body mass index (BMI) 28.0-28.9, adult: Secondary | ICD-10-CM | POA: Diagnosis not present

## 2017-04-03 ENCOUNTER — Other Ambulatory Visit (HOSPITAL_COMMUNITY): Payer: Medicare HMO

## 2017-04-07 ENCOUNTER — Ambulatory Visit (HOSPITAL_COMMUNITY)
Admission: RE | Admit: 2017-04-07 | Discharge: 2017-04-07 | Disposition: A | Discharge status: Home / Self Care | Payer: Medicare HMO | Source: Ambulatory Visit | Attending: Pulmonary Disease | Admitting: Pulmonary Disease

## 2017-04-07 ENCOUNTER — Encounter (HOSPITAL_COMMUNITY): Payer: Self-pay

## 2017-04-07 ENCOUNTER — Ambulatory Visit (HOSPITAL_COMMUNITY): Payer: Medicare HMO

## 2017-04-07 DIAGNOSIS — R911 Solitary pulmonary nodule: Secondary | ICD-10-CM | POA: Diagnosis present

## 2017-04-07 DIAGNOSIS — R590 Localized enlarged lymph nodes: Secondary | ICD-10-CM | POA: Diagnosis not present

## 2017-04-07 HISTORY — DX: Unspecified asthma, uncomplicated: J45.909

## 2017-04-07 MED ORDER — IOPAMIDOL (ISOVUE-300) INJECTION 61%
75.0000 mL | Freq: Once | INTRAVENOUS | Status: AC | PRN
  Administered 2017-04-07: 75 mL via INTRAVENOUS

## 2017-04-21 DIAGNOSIS — M47816 Spondylosis without myelopathy or radiculopathy, lumbar region: Secondary | ICD-10-CM | POA: Diagnosis not present

## 2017-04-21 DIAGNOSIS — M4302 Spondylolysis, cervical region: Secondary | ICD-10-CM | POA: Diagnosis not present

## 2017-04-26 ENCOUNTER — Encounter: Payer: Medicare HMO | Admitting: Cardiothoracic Surgery

## 2017-05-01 ENCOUNTER — Institutional Professional Consult (permissible substitution) (INDEPENDENT_AMBULATORY_CARE_PROVIDER_SITE_OTHER): Payer: Medicare HMO | Admitting: Cardiothoracic Surgery

## 2017-05-01 ENCOUNTER — Encounter: Payer: Self-pay | Admitting: Cardiothoracic Surgery

## 2017-05-01 VITALS — BP 127/76 | HR 90 | Resp 20 | Ht 66.0 in | Wt 183.0 lb

## 2017-05-01 DIAGNOSIS — R911 Solitary pulmonary nodule: Secondary | ICD-10-CM | POA: Diagnosis not present

## 2017-05-01 DIAGNOSIS — R590 Localized enlarged lymph nodes: Secondary | ICD-10-CM | POA: Diagnosis not present

## 2017-05-01 NOTE — Progress Notes (Signed)
PCP is Redmond School, MD Referring Provider is Sinda Du, MD  Chief Complaint  Patient presents with  . Adenopathy    Surgical eval, Chest CT 04/07/17, PET Scan 12/01/16   Patient examined, most recent CT scan of chest and PET scan images personally reviewed and counseled with patient HPI: Patient is a 56 year old Caucasian male reformed smoker-50-pack-year history but has stopped smoking except for 3 cigarettes a month. He is followed by Dr. [name] for a 11 mm left upper lobe subpleural nodule probable lymph node which has been stable. A CT-PET scan was performed in March of this year showing a left hilar node which had metabolic activity SUV 5.1. The subpleural left upper lobe nodule had no significant metabolic activity. The patient also had some other subcentimeter mediastinal nodes but without significant activity on PET scan. A follow-up CT scan of the chest was performed 4 weeks ago which showed the left hilar node to have slightly increased in size by 2 mm.  The patient denies significant weight loss hemoptysis or bone pain headache. He has had several courses of oral antibiotics for sinusitis this summer. He has had discolored/brownish sputum intermittently.  He is not certain there is  anyone in his family has ever had lung cancer.   The PET scan in March and and the subsequent CT scan in late July demonstrated a small left hilar node next to the left mainstem bronchus. This is approximately 1 cm. It would be very difficult to obtain a reliable biopsy of this small lymph node with bronchoscopy. I would recommend another course of oral antibiotics [Levaquin] then repeat the scan after a 3 month interval since the most recent scan.  Past Medical History:  Diagnosis Date  . Anxiety   . Asthma   . COPD (chronic obstructive pulmonary disease) ([place])   . DDD (degenerative disc disease)   . GERD (gastroesophageal reflux disease)   . HTN (hypertension)   . Seizures ([place])    last  seizure 7 yrs ago-unknown etiology    Past Surgical History:  Procedure Laterality Date  . CERVICAL DISC SURGERY     X4  . CHOLECYSTECTOMY    . COLONOSCOPY  08/24/2011   Procedure: COLONOSCOPY;  Surgeon: Daneil Dolin, MD;  Location: [facility];  Service: Endoscopy;  Laterality: N/A;  7:30AM  . ELBOW SURGERY     right    Family History  Problem Relation Age of Onset  . Hypertension Father   . Heart attack Father   . Kidney failure Mother   . Diabetes Mother   . Hypertension Mother   . Colon cancer Neg Hx   . Anesthesia problems Neg Hx   . Hypotension Neg Hx   . Malignant hyperthermia Neg Hx   . Pseudochol deficiency Neg Hx     Social History Social History  Substance Use Topics  . Smoking status: Former Smoker    Packs/day: 1.50    Years: 30.00    Types: Cigarettes    Quit date: 06/24/2011  . Smokeless tobacco: Never Used     Comment: 1-3 cigarettes   . Alcohol use No    Current Outpatient Prescriptions  Medication Sig Dispense Refill  . albuterol (PROVENTIL HFA;VENTOLIN HFA) 108 (90 BASE) MCG/ACT inhaler Inhale 2 puffs into the lungs every 6 (six) hours as needed. Shortness of breath     . albuterol (PROVENTIL) (2.5 MG/3ML) 0.083% nebulizer solution Take 2.5 mg by nebulization as directed.    Marland Kitchen alprazolam Duanne Moron) 2  MG tablet Take 2 mg by mouth at bedtime as needed. sleep    . amLODipine (NORVASC) 10 MG tablet Take 10 mg by mouth daily.    Marland Kitchen aspirin EC 81 MG tablet Take 1 tablet (81 mg total) by mouth daily. 30 tablet 0  . cyclobenzaprine (FLEXERIL) 10 MG tablet 1 tablet daily as needed.    . fluticasone (FLONASE) 50 MCG/ACT nasal spray 2 sprays 2 (two) times daily.    Marland Kitchen levETIRAcetam (KEPPRA) 500 MG tablet Take 1 tablet by mouth daily.    Marland Kitchen lisinopril (PRINIVIL,ZESTRIL) 20 MG tablet Take 20 mg by mouth 2 (two) times daily.    . meclizine (ANTIVERT) 12.5 MG tablet Take 1 tablet (12.5 mg total) by mouth 3 (three) times daily as needed for dizziness. 30 tablet 0   . naproxen (NAPROSYN) 500 MG tablet Take 500 mg by mouth 2 (two) times daily with a meal.      . Omega-3 Fatty Acids (FISH OIL) 1000 MG CAPS Take by mouth daily at 2 PM.    . OVER THE COUNTER MEDICATION Take 1 capsule by mouth once. CBD    . oxyCODONE (ROXICODONE) 15 MG immediate release tablet Take 15 mg by mouth 4 (four) times daily as needed. Pain    . pravastatin (PRAVACHOL) 20 MG tablet Take 1 tablet by mouth daily.    . tamsulosin (FLOMAX) 0.4 MG CAPS capsule 1 capsule daily.    Marland Kitchen umeclidinium-vilanterol (ANORO ELLIPTA) 62.5-25 MCG/INH AEPB Inhale 1 puff into the lungs daily.     No current facility-administered medications for this visit.     No Known Allergies  Review of Systems   Patient overall has symptoms of COPD-chronic cough, congestion, sinus congestion and dizziness, use of inhalers  Patient works part-time  The patient fell off a ladder years ago and shattered his right elbow. The patient is right-hand dominant.  The patient has upper dental plate and partial lower plate but no active dental complaints of painful teeth or loose teeth  Patient complains of probable GERD symptoms with postprandial supine heartburn chest discomfort. He is also course in the morning.  Patient had a cardiology evaluation this year by Dr. Gwenlyn Found with a normal chemical Myoview scan normal EF, normal echocardiogram  BP 127/76   Pulse 90   Resp 20   Ht 5\' 6"  (1.676 m)   Wt 183 lb (83 kg)   SpO2 95%   BMI 29.54 kg/m  Physical Exam       Exam    General- alert and comfortable   Neck - no JVD adenopathy or bruit   Lungs- scattered rhonchi and wheezes, no chest deformity   Cor- regular rate and rhythm, no murmur , gallop   Abdomen- soft, non-tender   Extremities - warm, non-tender, minimal edema   Neuro- oriented, appropriate, no focal weakness   Diagnostic Tests: CT scan PET scan images personally reviewed-results as noted above  Impression: Small left hilar lymph node  measuring approximately 1 cm appears to be probably inflammatory considering his total clinical picture although because of his history of smoking this will need to be followed. At this point it would be very low yield to biopsy and would have some risk with general anesthesia   Plan: Patient will complete a course of oral Levaquin 500 milligrams per day for 10 days   he will return to the office with CT scan of chest in November 2018.  Len Childs, MD [facility] [phone]

## 2017-06-22 ENCOUNTER — Inpatient Hospital Stay (HOSPITAL_COMMUNITY)
Admission: RE | Admit: 2017-06-22 | Payer: Medicare HMO | Source: Ambulatory Visit | Attending: Cardiovascular Disease | Admitting: Cardiovascular Disease

## 2017-07-06 ENCOUNTER — Other Ambulatory Visit: Payer: Self-pay | Admitting: Cardiothoracic Surgery

## 2017-07-06 DIAGNOSIS — R911 Solitary pulmonary nodule: Secondary | ICD-10-CM

## 2017-07-07 DIAGNOSIS — R69 Illness, unspecified: Secondary | ICD-10-CM | POA: Diagnosis not present

## 2017-07-07 DIAGNOSIS — I10 Essential (primary) hypertension: Secondary | ICD-10-CM | POA: Diagnosis not present

## 2017-07-07 DIAGNOSIS — Z6828 Body mass index (BMI) 28.0-28.9, adult: Secondary | ICD-10-CM | POA: Diagnosis not present

## 2017-07-07 DIAGNOSIS — E1129 Type 2 diabetes mellitus with other diabetic kidney complication: Secondary | ICD-10-CM | POA: Diagnosis not present

## 2017-07-07 DIAGNOSIS — Z1389 Encounter for screening for other disorder: Secondary | ICD-10-CM | POA: Diagnosis not present

## 2017-07-07 DIAGNOSIS — G894 Chronic pain syndrome: Secondary | ICD-10-CM | POA: Diagnosis not present

## 2017-07-07 DIAGNOSIS — E663 Overweight: Secondary | ICD-10-CM | POA: Diagnosis not present

## 2017-07-14 ENCOUNTER — Ambulatory Visit (HOSPITAL_COMMUNITY)
Admission: RE | Admit: 2017-07-14 | Discharge: 2017-07-14 | Disposition: A | Discharge status: Home / Self Care | Payer: Medicare HMO | Source: Ambulatory Visit | Attending: Cardiology | Admitting: Cardiology

## 2017-07-14 DIAGNOSIS — I10 Essential (primary) hypertension: Secondary | ICD-10-CM | POA: Diagnosis not present

## 2017-07-14 DIAGNOSIS — Z87891 Personal history of nicotine dependence: Secondary | ICD-10-CM | POA: Insufficient documentation

## 2017-07-14 DIAGNOSIS — I6523 Occlusion and stenosis of bilateral carotid arteries: Secondary | ICD-10-CM | POA: Insufficient documentation

## 2017-07-14 DIAGNOSIS — E119 Type 2 diabetes mellitus without complications: Secondary | ICD-10-CM | POA: Insufficient documentation

## 2017-07-14 DIAGNOSIS — E785 Hyperlipidemia, unspecified: Secondary | ICD-10-CM | POA: Diagnosis not present

## 2017-07-14 DIAGNOSIS — I679 Cerebrovascular disease, unspecified: Secondary | ICD-10-CM

## 2017-08-09 ENCOUNTER — Other Ambulatory Visit: Payer: Medicare HMO

## 2017-08-09 ENCOUNTER — Ambulatory Visit: Payer: Medicare HMO | Admitting: Cardiothoracic Surgery

## 2017-08-11 DIAGNOSIS — M47812 Spondylosis without myelopathy or radiculopathy, cervical region: Secondary | ICD-10-CM | POA: Diagnosis not present

## 2017-08-30 ENCOUNTER — Ambulatory Visit: Payer: Medicare HMO | Admitting: Cardiothoracic Surgery

## 2017-09-07 ENCOUNTER — Encounter: Payer: Self-pay | Admitting: *Deleted

## 2017-09-15 ENCOUNTER — Ambulatory Visit: Payer: Medicare HMO | Admitting: Cardiovascular Disease

## 2017-09-15 ENCOUNTER — Encounter: Payer: Self-pay | Admitting: Cardiovascular Disease

## 2017-09-15 VITALS — BP 122/78 | HR 58 | Ht 66.0 in | Wt 173.0 lb

## 2017-09-15 DIAGNOSIS — I679 Cerebrovascular disease, unspecified: Secondary | ICD-10-CM

## 2017-09-15 DIAGNOSIS — R072 Precordial pain: Secondary | ICD-10-CM

## 2017-09-15 MED ORDER — METOPROLOL TARTRATE 50 MG PO TABS: 50.0000 mg | ORAL_TABLET | Freq: Two times a day (BID) | ORAL | 3 refills | Status: DC

## 2017-09-15 MED ORDER — METOPROLOL TARTRATE 50 MG PO TABS: 50.0000 mg | ORAL_TABLET | Freq: Two times a day (BID) | ORAL | 0 refills | Status: DC

## 2017-09-15 MED ORDER — METOPROLOL TARTRATE 50 MG PO TABS: ORAL_TABLET | ORAL | 3 refills | Status: DC

## 2017-09-15 NOTE — Progress Notes (Signed)
09/15/2017 [name] [name]   1961-07-14  [id number]  Primary Physician Redmond School, MD Primary Cardiologist: Lorretta Harp MD Lupe Carney, [place]  HPI:  [name] [name] is a 57 y.o.  mildly overweight married Caucasian male father of 93, grandfather of 6 grandchildren accompanied by his wife [name] [name]. He was referred by Dr. Gerarda Fraction for cardiovascular evaluation because of recent TIA and other associated risk factors. I last saw him in the office 01/03/17. He has an 80-pack-year history of tobacco abuse having quit 6 months ago as well as treated hypertension and hyperlipidemia. His father did have a myocardial infarction and stent procedure in his late 29s. The patient has never had a heart attack. HE was evaluated for TIA. He does get chest pain which has approximately a year ago and has been on going on a daily basis. He did have a Myoview stress test performed 01/12/17 which is low risk and nonischemic. He has started smoking again now 1 pack per day (promised to stop 08/12/18, hand shake).   Current Meds  Medication Sig  . albuterol (PROVENTIL HFA;VENTOLIN HFA) 108 (90 BASE) MCG/ACT inhaler Inhale 2 puffs into the lungs every 6 (six) hours as needed. Shortness of breath   . albuterol (PROVENTIL) (2.5 MG/3ML) 0.083% nebulizer solution Take 2.5 mg by nebulization as directed.  Marland Kitchen alprazolam (XANAX) 2 MG tablet Take 2 mg by mouth at bedtime as needed. sleep  . amLODipine (NORVASC) 10 MG tablet Take 10 mg by mouth daily.  Marland Kitchen aspirin EC 81 MG tablet Take 1 tablet (81 mg total) by mouth daily.  . cyclobenzaprine (FLEXERIL) 10 MG tablet 1 tablet daily as needed.  . fluticasone (FLONASE) 50 MCG/ACT nasal spray 2 sprays 2 (two) times daily.  Marland Kitchen levETIRAcetam (KEPPRA) 500 MG tablet Take 1 tablet by mouth daily.  Marland Kitchen lisinopril (PRINIVIL,ZESTRIL) 20 MG tablet Take 20 mg by mouth 2 (two) times daily.  . meclizine (ANTIVERT) 12.5 MG tablet Take 1 tablet (12.5 mg total) by mouth 3 (three) times daily as  needed for dizziness.  . naproxen (NAPROSYN) 500 MG tablet Take 500 mg by mouth 2 (two) times daily with a meal.    . Omega-3 Fatty Acids (FISH OIL) 1000 MG CAPS Take by mouth daily at 2 PM.  . OVER THE COUNTER MEDICATION Take 1 capsule by mouth once. CBD  . oxyCODONE (ROXICODONE) 15 MG immediate release tablet Take 15 mg by mouth 4 (four) times daily as needed. Pain  . pravastatin (PRAVACHOL) 20 MG tablet Take 1 tablet by mouth daily.  . tamsulosin (FLOMAX) 0.4 MG CAPS capsule 1 capsule daily.  Marland Kitchen umeclidinium-vilanterol (ANORO ELLIPTA) 62.5-25 MCG/INH AEPB Inhale 1 puff into the lungs daily.     No Known Allergies  Social History   Socioeconomic History  . Marital status: Married    Spouse name: Not on file  . Number of children: Not on file  . Years of education: Not on file  . Highest education level: Not on file  Social Needs  . Financial resource strain: Not on file  . Food insecurity - worry: Not on file  . Food insecurity - inability: Not on file  . Transportation needs - medical: Not on file  . Transportation needs - non-medical: Not on file  Occupational History  . Occupation: disability  Tobacco Use  . Smoking status: Former Smoker    Packs/day: 1.50    Years: 30.00    Pack years: 45.00    Types:  Cigarettes    Last attempt to quit: 06/24/2011    Years since quitting: 6.2  . Smokeless tobacco: Never Used  . Tobacco comment: 1-3 cigarettes   Substance and Sexual Activity  . Alcohol use: No  . Drug use: No  . Sexual activity: Yes    Birth control/protection: None  Other Topics Concern  . Not on file  Social History Narrative  . Not on file     Review of Systems: General: negative for chills, fever, night sweats or weight changes.  Cardiovascular: negative for chest pain, dyspnea on exertion, edema, orthopnea, palpitations, paroxysmal nocturnal dyspnea or shortness of breath Dermatological: negative for rash Respiratory: negative for cough or  wheezing Urologic: negative for hematuria Abdominal: negative for nausea, vomiting, diarrhea, bright red blood per rectum, melena, or hematemesis Neurologic: negative for visual changes, syncope, or dizziness All other systems reviewed and are otherwise negative except as noted above.    Blood pressure 122/78, pulse (!) 58, height 5\' 6"  (1.676 m), weight 173 lb (78.5 kg).  General appearance: alert and no distress Neck: no adenopathy, no carotid bruit, no JVD, supple, symmetrical, trachea midline and thyroid not enlarged, symmetric, no tenderness/mass/nodules Lungs: clear to auscultation bilaterally Heart: regular rate and rhythm, S1, S2 normal, no murmur, click, rub or gallop Extremities: extremities normal, atraumatic, no cyanosis or edema Pulses: 2+ and symmetric Skin: Skin color, texture, turgor normal. No rashes or lesions Neurologic: Alert and oriented X 3, normal strength and tone. Normal symmetric reflexes. Normal coordination and gait  EKG sinus bradycardia at 58 without ST or T-wave changes. I personally reviewed this EKG  ASSESSMENT AND PLAN:   Chest pain Mr. Face has ongoing chest pain despite a negative Myoview stress test performed 01/12/17. He does have continued tobacco abuse, hypertension and hyperlipidemia as well as family history father had a myocardial infarction in his 40s. Based on this, I am going to get a coronary CTA to further evaluate.  HTN (hypertension) History of essential hypertension blood pressure measured 122/78. He says that his blood pressure cuff at home has consistently higher measurements which we will calibrate here in the office. He is on amlodipine and lisinopril. Continue current meds at current dosing  Hyperlipidemia History of hyperlipidemia on statin therapy followed by his PCP  Cerebrovascular disease History of TIA in the past with negative carotid Doppler studies and 2-D echocardiogram.      Lorretta Harp MD [facility],  [facility] 09/15/2017 9:27 AM

## 2017-09-15 NOTE — Assessment & Plan Note (Signed)
History of essential hypertension blood pressure measured 122/78. He says that his blood pressure cuff at home has consistently higher measurements which we will calibrate here in the office. He is on amlodipine and lisinopril. Continue current meds at current dosing

## 2017-09-15 NOTE — Assessment & Plan Note (Signed)
History of TIA in the past with negative carotid Doppler studies and 2-D echocardiogram.

## 2017-09-15 NOTE — Assessment & Plan Note (Signed)
[name] [name] has ongoing chest pain despite a negative Myoview stress test performed 01/12/17. He does have continued tobacco abuse, hypertension and hyperlipidemia as well as family history father had a myocardial infarction in his 30s. Based on this, I am going to get a coronary CTA to further evaluate.

## 2017-09-15 NOTE — Patient Instructions (Addendum)
Medications: Your physician recommends that you continue on your current medications as directed. Please refer to the Current Medication list given to you today.   Labwork: Your physician recommends that you return for lab work prior to CTA--BMET.   Testing: Coronary CTA  Please arrive at the [facility] main entrance of [facility] at                  (30-45 minutes prior to test start time)  [facility] [address] [place],  [address] [phone]  Proceed to the [facility] Radiology Department (First Floor).  Please follow these instructions carefully (unless otherwise directed):  Hold all erectile dysfunction medications at least 48 hours prior to test.  On the Night Before the Test: . Drink plenty of water. . Do not consume any caffeinated/decaffeinated beverages or chocolate 12 hours prior to your test. . Do not take any antihistamines 12 hours prior to your test.   On the Day of the Test: . Drink plenty of water. Do not drink any water within one hour of the test. . Do not eat any food 4 hours prior to the test. . You may take your regular medications prior to the test. . IF NOT ON A BETA BLOCKER - Take 50 mg of lopressor (metoprolol) one hour before the test.   After the Test: . Drink plenty of water. . After receiving IV contrast, you may experience a mild flushed feeling. This is normal. . On occasion, you may experience a mild rash up to 24 hours after the test. This is not dangerous. If this occurs, you can take Benadryl 25 mg and increase your fluid intake. . If you experience trouble breathing, this can be serious. If it is severe call 911 IMMEDIATELY. If it is mild, please call our office.   Follow-up: Your physician wants you to follow-up in: 1 year with Dr. Gwenlyn Found. You will receive a reminder letter in the mail two months in advance. If you don't receive a letter, please call our office to schedule the follow-up  appointment.  Your physician recommends that you schedule a follow-up appointment in: 1 month with PharmD in [facility]. Your physician has requested that you regularly monitor and record your blood pressure readings at home. Please use the same machine at the same time of day to check your readings and record them to bring them and your BP cuff to your follow-up visit.

## 2017-09-15 NOTE — Assessment & Plan Note (Signed)
History of hyperlipidemia on statin therapy followed by his PCP 

## 2017-09-18 ENCOUNTER — Encounter: Payer: Self-pay | Admitting: Cardiovascular Disease

## 2017-10-02 DIAGNOSIS — Z6825 Body mass index (BMI) 25.0-25.9, adult: Secondary | ICD-10-CM | POA: Diagnosis not present

## 2017-10-02 DIAGNOSIS — G894 Chronic pain syndrome: Secondary | ICD-10-CM | POA: Diagnosis not present

## 2017-10-02 DIAGNOSIS — E119 Type 2 diabetes mellitus without complications: Secondary | ICD-10-CM | POA: Diagnosis not present

## 2017-10-02 DIAGNOSIS — K1121 Acute sialoadenitis: Secondary | ICD-10-CM | POA: Diagnosis not present

## 2017-10-02 DIAGNOSIS — I10 Essential (primary) hypertension: Secondary | ICD-10-CM | POA: Diagnosis not present

## 2017-10-02 DIAGNOSIS — E663 Overweight: Secondary | ICD-10-CM | POA: Diagnosis not present

## 2017-10-02 DIAGNOSIS — R69 Illness, unspecified: Secondary | ICD-10-CM | POA: Diagnosis not present

## 2017-10-02 DIAGNOSIS — Z23 Encounter for immunization: Secondary | ICD-10-CM | POA: Diagnosis not present

## 2017-10-11 DIAGNOSIS — R072 Precordial pain: Secondary | ICD-10-CM | POA: Diagnosis not present

## 2017-10-12 LAB — BASIC METABOLIC PANEL
BUN/Creatinine Ratio: 13 (ref 9–20)
BUN: 12 mg/dL (ref 6–24)
CALCIUM: 9.6 mg/dL (ref 8.7–10.2)
CHLORIDE: 104 mmol/L (ref 96–106)
CO2: 22 mmol/L (ref 20–29)
Creatinine, Ser: 0.96 mg/dL (ref 0.76–1.27)
GFR calc Af Amer: 100 mL/min/{1.73_m2} (ref >59)
GFR, EST NON AFRICAN AMERICAN: 87 mL/min/{1.73_m2} (ref >59)
GLUCOSE: 85 mg/dL (ref 65–99)
Potassium: 4.8 mmol/L (ref 3.5–5.2)
SODIUM: 143 mmol/L (ref 134–144)

## 2017-10-19 ENCOUNTER — Ambulatory Visit: Payer: Medicare HMO

## 2017-10-20 ENCOUNTER — Ambulatory Visit (HOSPITAL_COMMUNITY)
Admission: RE | Admit: 2017-10-20 | Discharge: 2017-10-20 | Disposition: A | Discharge status: Home / Self Care | Payer: Medicare HMO | Source: Ambulatory Visit | Attending: Cardiovascular Disease | Admitting: Cardiovascular Disease

## 2017-10-20 DIAGNOSIS — R079 Chest pain, unspecified: Secondary | ICD-10-CM | POA: Diagnosis not present

## 2017-10-20 DIAGNOSIS — R590 Localized enlarged lymph nodes: Secondary | ICD-10-CM | POA: Insufficient documentation

## 2017-10-20 DIAGNOSIS — R072 Precordial pain: Secondary | ICD-10-CM

## 2017-10-20 MED ORDER — NITROGLYCERIN 0.4 MG SL SUBL
SUBLINGUAL_TABLET | SUBLINGUAL | Status: AC
  Administered 2017-10-20: 0.8 mg via SUBLINGUAL
  Filled 2017-10-20: qty 2

## 2017-10-20 MED ORDER — NITROGLYCERIN 0.4 MG SL SUBL
0.8000 mg | SUBLINGUAL_TABLET | Freq: Once | SUBLINGUAL | Status: AC
  Administered 2017-10-20: 0.8 mg via SUBLINGUAL

## 2017-10-20 MED ORDER — IOPAMIDOL (ISOVUE-370) INJECTION 76%
INTRAVENOUS | Status: AC
  Filled 2017-10-20: qty 100

## 2017-10-20 NOTE — Progress Notes (Signed)
Patient discharged from IR holding area with family.

## 2017-10-21 DIAGNOSIS — R079 Chest pain, unspecified: Secondary | ICD-10-CM | POA: Diagnosis not present

## 2017-10-25 ENCOUNTER — Ambulatory Visit: Payer: Medicare HMO | Admitting: Cardiothoracic Surgery

## 2017-10-25 ENCOUNTER — Encounter: Payer: Self-pay | Admitting: Cardiothoracic Surgery

## 2017-10-25 ENCOUNTER — Other Ambulatory Visit: Payer: Medicare HMO

## 2017-10-25 ENCOUNTER — Ambulatory Visit
Admission: RE | Admit: 2017-10-25 | Discharge: 2017-10-25 | Disposition: A | Discharge status: Home / Self Care | Payer: Medicare HMO | Source: Ambulatory Visit | Attending: Cardiothoracic Surgery | Admitting: Cardiothoracic Surgery

## 2017-10-25 VITALS — BP 141/73 | HR 68 | Resp 20 | Ht 66.0 in | Wt 172.0 lb

## 2017-10-25 DIAGNOSIS — R911 Solitary pulmonary nodule: Secondary | ICD-10-CM

## 2017-10-25 DIAGNOSIS — R590 Localized enlarged lymph nodes: Secondary | ICD-10-CM

## 2017-10-25 DIAGNOSIS — R918 Other nonspecific abnormal finding of lung field: Secondary | ICD-10-CM | POA: Diagnosis not present

## 2017-10-25 NOTE — Progress Notes (Signed)
PCP is Redmond School, MD Referring Provider is Sinda Du, MD  Chief Complaint  Patient presents with  . Lung Lesion    3 month f/u with Chest CT    HPI: Patient presents for delayed follow-up 6 months with CT scan-intended for follow-up 3 months ago. The patient is a 57 year old nondiabetic male with heavy smoking history, hypertension, history of seizure disorder remotely, and mediastinal and left hilar adenopathy. The patient was initially evaluated last summer for some centimeter adenopathy in the AP window and left hilum. A PET scan had been performed which showed mild metabolic activity in the setting of recurrent bouts of upper respiratory infection. The patient was concurrently smoking and he was advised to stop smoking, complete his course of oral antibiotics, and returnin 3 months with follow-up CT scan. He then went 3 evaluation of chest pain that ultimately led to a cardiac CT scan with FFR that showed no significant coronary artery lesions with good LV function. He now returns with a follow-up CT scan showing the AP window lymph node and left hilar lymph node to be enlarged to approximately 1.5 cm. The patient stopped smoking approximately 10 days ago. He denies productive cough. He has lost approximately 10 pounds in the past 3 months during a move and with increased activities and states his appetite is normal. He also has symptoms of chronic pain from arthritis, GERD, and difficulty swallowing.  I personally reviewed the CT images and counseled with patient and wife. Since the mediastinal and hilar nodes are increased in size and are reasonable targets for endobronchial biopsy we will schedule the patient for bronchoscopy and EBUS under general anesthesia in the operating room. Mediastinoscopy  would not access the lymph nodes.   Past Medical History:  Diagnosis Date  . Anxiety   . Asthma   . COPD (chronic obstructive pulmonary disease) ([place])   . DDD (degenerative disc  disease)   . GERD (gastroesophageal reflux disease)   . HTN (hypertension)   . Seizures ([place])    last seizure 7 yrs ago-unknown etiology    Past Surgical History:  Procedure Laterality Date  . CERVICAL DISC SURGERY     X4  . CHOLECYSTECTOMY    . COLONOSCOPY  08/24/2011   Procedure: COLONOSCOPY;  Surgeon: Daneil Dolin, MD;  Location: [facility] END[facility] SUITE;  Service: Endoscopy;  Laterality: N/A;  7:30AM  . ELBOW SURGERY     right    Family History  Problem Relation Age of Onset  . Hypertension Father   . Heart attack Father   . Kidney failure Mother   . Diabetes Mother   . Hypertension Mother   . Colon cancer Neg Hx   . Anesthesia problems Neg Hx   . Hypotension Neg Hx   . Malignant hyperthermia Neg Hx   . Pseudochol deficiency Neg Hx     Social History Social History   Tobacco Use  . Smoking status: Former Smoker    Packs/day: 1.50    Years: 30.00    Pack years: 45.00    Types: Cigarettes    Last attempt to quit: 06/24/2011    Years since quitting: 6.3  . Smokeless tobacco: Never Used  . Tobacco comment: 1-3 cigarettes   Substance Use Topics  . Alcohol use: No  . Drug use: No    Current Outpatient Medications  Medication Sig Dispense Refill  . albuterol (PROVENTIL HFA;VENTOLIN HFA) 108 (90 BASE) MCG/ACT inhaler Inhale 2 puffs into the lungs every 6 (six) hours  as needed. Shortness of breath     . albuterol (PROVENTIL) (2.5 MG/3ML) 0.083% nebulizer solution Take 2.5 mg by nebulization as directed.    Marland Kitchen alprazolam (XANAX) 2 MG tablet Take 2 mg by mouth at bedtime as needed. sleep    . amLODipine (NORVASC) 10 MG tablet Take 10 mg by mouth daily.    Marland Kitchen aspirin EC 81 MG tablet Take 1 tablet (81 mg total) by mouth daily. 30 tablet 0  . cyclobenzaprine (FLEXERIL) 10 MG tablet 1 tablet daily as needed.    . fluticasone (FLONASE) 50 MCG/ACT nasal spray 2 sprays 2 (two) times daily.    Marland Kitchen levETIRAcetam (KEPPRA) 500 MG tablet Take 1 tablet by mouth daily.    Marland Kitchen lisinopril  (PRINIVIL,ZESTRIL) 20 MG tablet Take 20 mg by mouth 2 (two) times daily.    . meclizine (ANTIVERT) 12.5 MG tablet Take 1 tablet (12.5 mg total) by mouth 3 (three) times daily as needed for dizziness. 30 tablet 0  . metoprolol tartrate (LOPRESSOR) 50 MG tablet Take 1 tablet by mouth one hour prior to CTA. 180 tablet 3  . naproxen (NAPROSYN) 500 MG tablet Take 500 mg by mouth 2 (two) times daily with a meal.      . Omega-3 Fatty Acids (FISH OIL) 1000 MG CAPS Take by mouth daily at 2 PM.    . OVER THE COUNTER MEDICATION Take 1 capsule by mouth once. CBD    . oxyCODONE (ROXICODONE) 15 MG immediate release tablet Take 15 mg by mouth 4 (four) times daily as needed. Pain    . pravastatin (PRAVACHOL) 20 MG tablet Take 1 tablet by mouth daily.    . tamsulosin (FLOMAX) 0.4 MG CAPS capsule 1 capsule daily.    Marland Kitchen umeclidinium-vilanterol (ANORO ELLIPTA) 62.5-25 MCG/INH AEPB Inhale 1 puff into the lungs daily.     No current facility-administered medications for this visit.     No Known Allergies  Review of Systems  Recently stopped smoking   chronic headache Intermittent difficulty swallowing with GERD symptoms Diffuse tenderness and pain over her anterior chest wall, nonspecific No change in bowel habits Weight lost 10 pounds over 3-4 months No night sweats Recently completed a course of oral antibiotics by his primary care physician for possible left parotid duct stone obstruction  BP (!) 141/73   Pulse 68   Resp 20   Ht 5\' 6"  (1.676 m)   Wt 172 lb (78 kg)   SpO2 97% Comment: RA  BMI 27.76 kg/m  Physical Exam      Exam    General- alert and comfortable, accompanied by wife    Neck- no JVD, no cervical adenopathy palpable, no carotid bruit   Lungs- generally coarse breath sounds but without wheezes   Cor- regular rate and rhythm, no murmur , gallop   Abdomen- soft, non-tender   Extremities - warm, non-tender, minimal edema   Neuro- oriented, appropriate, no focal  weakness   Diagnostic Tests:  CT scan images personally reviewed and demonstrated to patient and wife. Mediastinal and left hilar nodes have enlarged over the past 6 months and biopsies recommended.   Impression: Plan Patient will be prepared for bronchoscopy and endobronchial biopsy of left lower lobe and endobronchial ultrasound directed lymph node biopsy of mediastinal lymph nodes under general anesthesia at [facility] on [address]. They understand the reason for this option is to evaluate for possible lung cancer. They understand the risks of bleeding, pneumothorax,. The patient agrees to proceed with surgery.  Len Childs, MD [facility] [phone]

## 2017-10-26 ENCOUNTER — Other Ambulatory Visit: Payer: Self-pay | Admitting: *Deleted

## 2017-10-26 DIAGNOSIS — R590 Localized enlarged lymph nodes: Secondary | ICD-10-CM

## 2017-10-30 ENCOUNTER — Other Ambulatory Visit: Payer: Self-pay

## 2017-10-30 ENCOUNTER — Encounter (HOSPITAL_COMMUNITY): Payer: Self-pay | Admitting: *Deleted

## 2017-10-30 NOTE — Progress Notes (Signed)
Spoke with pt for pre-op call. Pt denies cardiac history. States he had a TIA in Feb. 2018. No lasting deficits. Pt quit smoking on 10/13/17.

## 2017-10-31 ENCOUNTER — Ambulatory Visit (HOSPITAL_COMMUNITY): Payer: Medicare HMO

## 2017-10-31 ENCOUNTER — Ambulatory Visit (HOSPITAL_COMMUNITY): Payer: Medicare HMO | Admitting: Certified Registered Nurse Anesthetist

## 2017-10-31 ENCOUNTER — Encounter (HOSPITAL_COMMUNITY): Payer: Self-pay

## 2017-10-31 ENCOUNTER — Encounter (HOSPITAL_COMMUNITY)
Admission: RE | Disposition: A | Discharge status: Home / Self Care | Payer: Self-pay | Source: Ambulatory Visit | Attending: Cardiothoracic Surgery

## 2017-10-31 ENCOUNTER — Ambulatory Visit (HOSPITAL_COMMUNITY)
Admission: RE | Admit: 2017-10-31 | Discharge: 2017-10-31 | Disposition: A | Discharge status: Home / Self Care | Payer: Medicare HMO | Source: Ambulatory Visit | Attending: Cardiothoracic Surgery | Admitting: Cardiothoracic Surgery

## 2017-10-31 DIAGNOSIS — Z8249 Family history of ischemic heart disease and other diseases of the circulatory system: Secondary | ICD-10-CM | POA: Diagnosis not present

## 2017-10-31 DIAGNOSIS — J449 Chronic obstructive pulmonary disease, unspecified: Secondary | ICD-10-CM | POA: Diagnosis not present

## 2017-10-31 DIAGNOSIS — Z9049 Acquired absence of other specified parts of digestive tract: Secondary | ICD-10-CM | POA: Insufficient documentation

## 2017-10-31 DIAGNOSIS — R590 Localized enlarged lymph nodes: Secondary | ICD-10-CM | POA: Diagnosis not present

## 2017-10-31 DIAGNOSIS — Z833 Family history of diabetes mellitus: Secondary | ICD-10-CM | POA: Insufficient documentation

## 2017-10-31 DIAGNOSIS — Z9889 Other specified postprocedural states: Secondary | ICD-10-CM

## 2017-10-31 DIAGNOSIS — K219 Gastro-esophageal reflux disease without esophagitis: Secondary | ICD-10-CM | POA: Insufficient documentation

## 2017-10-31 DIAGNOSIS — R569 Unspecified convulsions: Secondary | ICD-10-CM | POA: Diagnosis not present

## 2017-10-31 DIAGNOSIS — I679 Cerebrovascular disease, unspecified: Secondary | ICD-10-CM | POA: Diagnosis not present

## 2017-10-31 DIAGNOSIS — E785 Hyperlipidemia, unspecified: Secondary | ICD-10-CM | POA: Diagnosis not present

## 2017-10-31 DIAGNOSIS — F419 Anxiety disorder, unspecified: Secondary | ICD-10-CM | POA: Diagnosis not present

## 2017-10-31 DIAGNOSIS — Z8673 Personal history of transient ischemic attack (TIA), and cerebral infarction without residual deficits: Secondary | ICD-10-CM | POA: Diagnosis not present

## 2017-10-31 DIAGNOSIS — Z87891 Personal history of nicotine dependence: Secondary | ICD-10-CM | POA: Insufficient documentation

## 2017-10-31 DIAGNOSIS — I10 Essential (primary) hypertension: Secondary | ICD-10-CM | POA: Insufficient documentation

## 2017-10-31 DIAGNOSIS — Z7982 Long term (current) use of aspirin: Secondary | ICD-10-CM | POA: Insufficient documentation

## 2017-10-31 DIAGNOSIS — R0602 Shortness of breath: Secondary | ICD-10-CM

## 2017-10-31 DIAGNOSIS — Z841 Family history of disorders of kidney and ureter: Secondary | ICD-10-CM | POA: Insufficient documentation

## 2017-10-31 DIAGNOSIS — Z7951 Long term (current) use of inhaled steroids: Secondary | ICD-10-CM | POA: Diagnosis not present

## 2017-10-31 DIAGNOSIS — R918 Other nonspecific abnormal finding of lung field: Secondary | ICD-10-CM | POA: Diagnosis not present

## 2017-10-31 DIAGNOSIS — Z79899 Other long term (current) drug therapy: Secondary | ICD-10-CM | POA: Diagnosis not present

## 2017-10-31 DIAGNOSIS — R69 Illness, unspecified: Secondary | ICD-10-CM | POA: Diagnosis not present

## 2017-10-31 DIAGNOSIS — R896 Abnormal cytological findings in specimens from other organs, systems and tissues: Secondary | ICD-10-CM | POA: Diagnosis not present

## 2017-10-31 HISTORY — DX: Localized enlarged lymph nodes: R59.0

## 2017-10-31 HISTORY — DX: Headache: R51

## 2017-10-31 HISTORY — DX: Cerebral infarction, unspecified: I63.9

## 2017-10-31 HISTORY — PX: VIDEO BRONCHOSCOPY WITH ENDOBRONCHIAL ULTRASOUND: SHX6177

## 2017-10-31 HISTORY — DX: Sleep apnea, unspecified: G47.30

## 2017-10-31 HISTORY — DX: Inflammatory liver disease, unspecified: K75.9

## 2017-10-31 HISTORY — DX: Headache, unspecified: R51.9

## 2017-10-31 LAB — COMPREHENSIVE METABOLIC PANEL
ALT: 15 U/L — ABNORMAL LOW (ref 17–63)
AST: 18 U/L (ref 15–41)
Albumin: 3.7 g/dL (ref 3.5–5.0)
Alkaline Phosphatase: 100 U/L (ref 38–126)
Anion gap: 10 (ref 5–15)
BUN: 13 mg/dL (ref 6–20)
CO2: 24 mmol/L (ref 22–32)
Calcium: 9.2 mg/dL (ref 8.9–10.3)
Chloride: 105 mmol/L (ref 101–111)
Creatinine, Ser: 0.97 mg/dL (ref 0.61–1.24)
GFR calc Af Amer: >60 mL/min (ref >60)
GFR calc non Af Amer: >60 mL/min (ref >60)
Glucose, Bld: 111 mg/dL — ABNORMAL HIGH (ref 65–99)
Potassium: 4.6 mmol/L (ref 3.5–5.1)
Sodium: 139 mmol/L (ref 135–145)
Total Bilirubin: 0.7 mg/dL (ref 0.3–1.2)
Total Protein: 6.6 g/dL (ref 6.5–8.1)

## 2017-10-31 LAB — CBC
HCT: 43.8 % (ref 39.0–52.0)
Hemoglobin: 14.8 g/dL (ref 13.0–17.0)
MCH: 30.8 pg (ref 26.0–34.0)
MCHC: 33.8 g/dL (ref 30.0–36.0)
MCV: 91.1 fL (ref 78.0–100.0)
Platelets: 218 10*3/uL (ref 150–400)
RBC: 4.81 MIL/uL (ref 4.22–5.81)
RDW: 12.6 % (ref 11.5–15.5)
WBC: 7.4 10*3/uL (ref 4.0–10.5)

## 2017-10-31 LAB — PROTIME-INR
INR: 0.99
Prothrombin Time: 13 seconds (ref 11.4–15.2)

## 2017-10-31 LAB — APTT: aPTT: 29 seconds (ref 24–36)

## 2017-10-31 SURGERY — BRONCHOSCOPY, WITH EBUS
Anesthesia: General | Site: Chest

## 2017-10-31 MED ORDER — ONDANSETRON HCL 4 MG/2ML IJ SOLN
INTRAMUSCULAR | Status: AC
  Filled 2017-10-31: qty 2

## 2017-10-31 MED ORDER — LIDOCAINE HCL (CARDIAC) 20 MG/ML IV SOLN
INTRAVENOUS | Status: DC | PRN
  Administered 2017-10-31: 60 mg via INTRAVENOUS

## 2017-10-31 MED ORDER — EPHEDRINE SULFATE-NACL 50-0.9 MG/10ML-% IV SOSY
PREFILLED_SYRINGE | INTRAVENOUS | Status: DC | PRN
  Administered 2017-10-31: 10 mg via INTRAVENOUS

## 2017-10-31 MED ORDER — FENTANYL CITRATE (PF) 100 MCG/2ML IJ SOLN
INTRAMUSCULAR | Status: DC | PRN
  Administered 2017-10-31: 50 ug via INTRAVENOUS
  Administered 2017-10-31: 100 ug via INTRAVENOUS
  Administered 2017-10-31: 50 ug via INTRAVENOUS

## 2017-10-31 MED ORDER — MIDAZOLAM HCL 5 MG/5ML IJ SOLN
INTRAMUSCULAR | Status: DC | PRN
  Administered 2017-10-31: 2 mg via INTRAVENOUS

## 2017-10-31 MED ORDER — CEFAZOLIN SODIUM 1 G IJ SOLR
INTRAMUSCULAR | Status: AC
  Filled 2017-10-31: qty 20

## 2017-10-31 MED ORDER — PROPOFOL 10 MG/ML IV BOLUS
INTRAVENOUS | Status: DC | PRN
  Administered 2017-10-31: 200 mg via INTRAVENOUS

## 2017-10-31 MED ORDER — RACEPINEPHRINE HCL 2.25 % IN NEBU
0.5000 mL | INHALATION_SOLUTION | Freq: Once | RESPIRATORY_TRACT | Status: AC
  Administered 2017-10-31: 0.5 mL via RESPIRATORY_TRACT

## 2017-10-31 MED ORDER — DEXAMETHASONE SODIUM PHOSPHATE 10 MG/ML IJ SOLN
INTRAMUSCULAR | Status: DC | PRN
  Administered 2017-10-31: 5 mg via INTRAVENOUS

## 2017-10-31 MED ORDER — LACTATED RINGERS IV SOLN
INTRAVENOUS | Status: DC
  Administered 2017-10-31: 10:00:00 via INTRAVENOUS

## 2017-10-31 MED ORDER — CEFAZOLIN SODIUM-DEXTROSE 2-3 GM-%(50ML) IV SOLR
INTRAVENOUS | Status: DC | PRN
  Administered 2017-10-31: 2 g via INTRAVENOUS

## 2017-10-31 MED ORDER — OXYCODONE HCL 5 MG PO TABS: 5.0000 mg | ORAL_TABLET | Freq: Once | ORAL | Status: DC | PRN

## 2017-10-31 MED ORDER — SODIUM CHLORIDE 0.9% FLUSH: 3.0000 mL | Freq: Two times a day (BID) | INTRAVENOUS | Status: DC

## 2017-10-31 MED ORDER — ROCURONIUM BROMIDE 100 MG/10ML IV SOLN
INTRAVENOUS | Status: DC | PRN
  Administered 2017-10-31: 10 mg via INTRAVENOUS
  Administered 2017-10-31 (×2): 20 mg via INTRAVENOUS
  Administered 2017-10-31: 40 mg via INTRAVENOUS

## 2017-10-31 MED ORDER — FENTANYL CITRATE (PF) 250 MCG/5ML IJ SOLN
INTRAMUSCULAR | Status: AC
Start: 2017-10-31 — End: 2017-10-31
  Filled 2017-10-31: qty 5

## 2017-10-31 MED ORDER — OXYCODONE HCL 5 MG PO TABS: 5.0000 mg | ORAL_TABLET | ORAL | Status: DC | PRN

## 2017-10-31 MED ORDER — ACETAMINOPHEN 650 MG RE SUPP: 650.0000 mg | RECTAL | Status: DC | PRN

## 2017-10-31 MED ORDER — PROPOFOL 10 MG/ML IV BOLUS
INTRAVENOUS | Status: AC
  Filled 2017-10-31: qty 20

## 2017-10-31 MED ORDER — ONDANSETRON HCL 4 MG/2ML IJ SOLN: 4.0000 mg | Freq: Four times a day (QID) | INTRAMUSCULAR | Status: DC | PRN

## 2017-10-31 MED ORDER — FENTANYL CITRATE (PF) 100 MCG/2ML IJ SOLN: 25.0000 ug | INTRAMUSCULAR | Status: DC | PRN

## 2017-10-31 MED ORDER — RACEPINEPHRINE HCL 2.25 % IN NEBU
INHALATION_SOLUTION | RESPIRATORY_TRACT | Status: AC
  Filled 2017-10-31: qty 0.5

## 2017-10-31 MED ORDER — MIDAZOLAM HCL 2 MG/2ML IJ SOLN
INTRAMUSCULAR | Status: AC
  Filled 2017-10-31: qty 2

## 2017-10-31 MED ORDER — SODIUM CHLORIDE 0.9 % IV SOLN: 250.0000 mL | INTRAVENOUS | Status: DC | PRN

## 2017-10-31 MED ORDER — ONDANSETRON HCL 4 MG/2ML IJ SOLN
INTRAMUSCULAR | Status: DC | PRN
  Administered 2017-10-31: 4 mg via INTRAVENOUS

## 2017-10-31 MED ORDER — LIDOCAINE HCL 4 % EX SOLN
CUTANEOUS | Status: DC | PRN
  Administered 2017-10-31: 2 mL via TOPICAL

## 2017-10-31 MED ORDER — SUGAMMADEX SODIUM 200 MG/2ML IV SOLN
INTRAVENOUS | Status: AC
  Filled 2017-10-31: qty 2

## 2017-10-31 MED ORDER — SODIUM CHLORIDE 0.9% FLUSH: 3.0000 mL | INTRAVENOUS | Status: DC | PRN

## 2017-10-31 MED ORDER — 0.9 % SODIUM CHLORIDE (POUR BTL) OPTIME
TOPICAL | Status: DC | PRN
  Administered 2017-10-31: 1000 mL

## 2017-10-31 MED ORDER — DEXAMETHASONE SODIUM PHOSPHATE 10 MG/ML IJ SOLN
INTRAMUSCULAR | Status: AC
  Filled 2017-10-31: qty 1

## 2017-10-31 MED ORDER — ACETAMINOPHEN 325 MG PO TABS: 650.0000 mg | ORAL_TABLET | ORAL | Status: DC | PRN

## 2017-10-31 MED ORDER — PHENYLEPHRINE 40 MCG/ML (10ML) SYRINGE FOR IV PUSH (FOR BLOOD PRESSURE SUPPORT)
PREFILLED_SYRINGE | INTRAVENOUS | Status: AC
Start: 2017-10-31 — End: 2017-10-31
  Filled 2017-10-31: qty 10

## 2017-10-31 MED ORDER — OXYCODONE HCL 5 MG/5ML PO SOLN: 5.0000 mg | Freq: Once | ORAL | Status: DC | PRN

## 2017-10-31 MED ORDER — PHENYLEPHRINE HCL 10 MG/ML IJ SOLN
INTRAVENOUS | Status: DC | PRN
  Administered 2017-10-31 (×2): 80 ug/min via INTRAVENOUS
  Administered 2017-10-31: 40 ug/min via INTRAVENOUS

## 2017-10-31 MED ORDER — SUGAMMADEX SODIUM 200 MG/2ML IV SOLN
INTRAVENOUS | Status: DC | PRN
  Administered 2017-10-31: 200 mg via INTRAVENOUS

## 2017-10-31 SURGICAL SUPPLY — 38 items
BALL CTTN LRG ABS STRL LF (GAUZE/BANDAGES/DRESSINGS)
BRUSH CYTOL CELLEBRITY 1.5X140 (MISCELLANEOUS) ×2 IMPLANT
CANISTER SUCT 3000ML PPV (MISCELLANEOUS) ×3 IMPLANT
CONT SPEC 4OZ CLIKSEAL STRL BL (MISCELLANEOUS) ×3 IMPLANT
COTTONBALL LRG STERILE PKG (GAUZE/BANDAGES/DRESSINGS) IMPLANT
COVER BACK TABLE 60X90IN (DRAPES) ×3 IMPLANT
COVER DOME SNAP 22 D (MISCELLANEOUS) ×3 IMPLANT
FORCEPS BIOP RJ4 1.8 (CUTTING FORCEPS) IMPLANT
FORCEPS RADIAL JAW LRG 4 PULM (INSTRUMENTS) IMPLANT
GAUZE SPONGE 4X4 12PLY STRL (GAUZE/BANDAGES/DRESSINGS) IMPLANT
GLOVE BIO SURGEON STRL SZ 6 (GLOVE) ×2 IMPLANT
GLOVE BIO SURGEON STRL SZ7.5 (GLOVE) ×5 IMPLANT
GLOVE BIOGEL PI IND STRL 8.5 (GLOVE) IMPLANT
GLOVE BIOGEL PI INDICATOR 8.5 (GLOVE) ×2
KIT CLEAN ENDO COMPLIANCE (KITS) ×9 IMPLANT
KIT ROOM TURNOVER OR (KITS) ×3 IMPLANT
MARKER SKIN DUAL TIP RULER LAB (MISCELLANEOUS) ×3 IMPLANT
NDL BLUNT 18X1 FOR OR ONLY (NEEDLE) IMPLANT
NDL EBUS SONO TIP PENTAX (NEEDLE) ×1 IMPLANT
NEEDLE 22X1 1/2 (OR ONLY) (NEEDLE) IMPLANT
NEEDLE BLUNT 18X1 FOR OR ONLY (NEEDLE) IMPLANT
NEEDLE EBUS SONO TIP PENTAX (NEEDLE) ×3 IMPLANT
NS IRRIG 1000ML POUR BTL (IV SOLUTION) ×3 IMPLANT
OIL SILICONE PENTAX (PARTS (SERVICE/REPAIRS)) ×3 IMPLANT
PAD ARMBOARD 7.5X6 YLW CONV (MISCELLANEOUS) ×6 IMPLANT
RADIAL JAW LRG 4 PULMONARY (INSTRUMENTS) ×2
SOLUTION ANTI FOG 6CC (MISCELLANEOUS) ×3 IMPLANT
SYR 20CC LL (SYRINGE) ×3 IMPLANT
SYR 20ML ECCENTRIC (SYRINGE) ×3 IMPLANT
SYR 5ML LUER SLIP (SYRINGE) ×3 IMPLANT
SYR CONTROL 10ML LL (SYRINGE) IMPLANT
TOWEL GREEN STERILE (TOWEL DISPOSABLE) ×3 IMPLANT
TOWEL GREEN STERILE FF (TOWEL DISPOSABLE) ×3 IMPLANT
TRAP SPECIMEN MUCOUS 40CC (MISCELLANEOUS) ×3 IMPLANT
TUBE CONNECTING 20'X1/4 (TUBING) ×3
TUBE CONNECTING 20X1/4 (TUBING) ×5 IMPLANT
VALVE DISPOSABLE (MISCELLANEOUS) ×3 IMPLANT
WATER STERILE IRR 1000ML POUR (IV SOLUTION) ×3 IMPLANT

## 2017-10-31 NOTE — Brief Op Note (Signed)
10/31/2017  12:55 PM  PATIENT:  [name] [name]  57 y.o. male  PRE-OPERATIVE DIAGNOSIS:  MEDIASTINAL ADENOPATHY  POST-OPERATIVE DIAGNOSIS:  MEDIASTINAL ADENOPATHY  PROCEDURE:  Procedure(s): VIDEO BRONCHOSCOPY WITH ENDOBRONCHIAL ULTRASOUND (N/A) Biopst and washings L lung, EBUS bx of 4L nodes  SURGEON:  Surgeon(s) and Role:    Ivin Poot, MD - Primary  PHYSICIAN ASSISTANT:   ASSISTANTS: none   ANESTHESIA:   general  EBL:  0 mL   BLOOD ADMINISTERED:none  DRAINS: none   LOCAL MEDICATIONS USED:  NONE  SPECIMEN:  Aspirate, Lavage/Washing and Scraping  DISPOSITION OF SPECIMEN:  PATHOLOGY  COUNTS:  YES  TOURNIQUET:  * No tourniquets in log *  DICTATION: .Dragon Dictation  PLAN OF CARE: Discharge to home after PACU  PATIENT DISPOSITION:  PACU - hemodynamically stable.   Delay start of Pharmacological VTE agent (>24hrs) due to surgical blood loss or risk of bleeding: yes

## 2017-10-31 NOTE — Progress Notes (Signed)
Pre Procedure note for inpatients:   [name] [name] has been scheduled for Procedure(s): VIDEO BRONCHOSCOPY WITH ENDOBRONCHIAL ULTRASOUND (N/A) today. The various methods of treatment have been discussed with the patient. After consideration of the risks, benefits and treatment options the patient has consented to the planned procedure.   The patient has been seen and labs reviewed. There are no changes in the patient's condition to prevent proceeding with the planned procedure today.  Recent labs:  Lab Results  Component Value Date   WBC 7.4 10/31/2017   HGB 14.8 10/31/2017   HCT 43.8 10/31/2017   PLT 218 10/31/2017   GLUCOSE 85 10/11/2017   CHOL 171 10/27/2016   TRIG 118 10/27/2016   HDL 40 (L) 10/27/2016   LDLCALC 107 (H) 10/27/2016   ALT 24 10/26/2016   AST 19 10/26/2016   NA 143 10/11/2017   K 4.8 10/11/2017   CL 104 10/11/2017   CREATININE 0.96 10/11/2017   BUN 12 10/11/2017   CO2 22 10/11/2017   INR 0.99 10/26/2016   HGBA1C 5.5 10/26/2016    Ivin Poot III, MD 10/31/2017 10:12 AM

## 2017-10-31 NOTE — Transfer of Care (Signed)
Immediate Anesthesia Transfer of Care Note  Patient: [name] [name]  Procedure(s) Performed: VIDEO BRONCHOSCOPY WITH ENDOBRONCHIAL ULTRASOUND (N/A Chest)  Patient Location: PACU  Anesthesia Type:General  Level of Consciousness: awake, alert , oriented and patient cooperative  Airway & Oxygen Therapy: Patient Spontanous Breathing and Patient connected to nasal cannula oxygen  Post-op Assessment: Report given to RN, Post -op Vital signs reviewed and stable, Patient moving all extremities and Patient moving all extremities X 4  Post vital signs: Reviewed and stable  Last Vitals:  Vitals:   10/31/17 0926  BP: 136/70  Pulse: (!) 54  Resp: 20  Temp: 37.1 C  SpO2: 100%    Last Pain:  Vitals:   10/31/17 0926  TempSrc: Oral         Complications: No apparent anesthesia complications

## 2017-10-31 NOTE — Anesthesia Preprocedure Evaluation (Signed)
Anesthesia Evaluation  Patient identified by MRN, date of birth, ID band Patient awake    Reviewed: Allergy & Precautions, H&P , NPO status , Patient's Chart, lab work & pertinent test results  Airway Mallampati: II   Neck ROM: full    Dental   Pulmonary asthma , sleep apnea , COPD, former smoker,    breath sounds clear to auscultation       Cardiovascular hypertension,  Rhythm:regular Rate:Normal     Neuro/Psych  Headaches, Seizures -,  PSYCHIATRIC DISORDERS Anxiety CVA    GI/Hepatic GERD  ,  Endo/Other  diabetes  Renal/GU      Musculoskeletal  (+) Arthritis ,   Abdominal   Peds  Hematology   Anesthesia Other Findings   Reproductive/Obstetrics                             Anesthesia Physical Anesthesia Plan  ASA: III  Anesthesia Plan: General   Post-op Pain Management:    Induction: Intravenous  PONV Risk Score and Plan: 2 and Ondansetron and Treatment may vary due to age or medical condition  Airway Management Planned: Oral ETT  Additional Equipment:   Intra-op Plan:   Post-operative Plan: Extubation in OR  Informed Consent: I have reviewed the patients History and Physical, chart, labs and discussed the procedure including the risks, benefits and alternatives for the proposed anesthesia with the patient or authorized representative who has indicated his/her understanding and acceptance.     Plan Discussed with: CRNA, Anesthesiologist and Surgeon  Anesthesia Plan Comments:         Anesthesia Quick Evaluation

## 2017-10-31 NOTE — Anesthesia Procedure Notes (Signed)
Procedure Name: Intubation Date/Time: 10/31/2017 11:13 AM Performed by: Izora Gala, CRNA Pre-anesthesia Checklist: Patient identified Patient Re-evaluated:Patient Re-evaluated prior to induction Oxygen Delivery Method: Circle system utilized Preoxygenation: Pre-oxygenation with 100% oxygen Induction Type: IV induction Ventilation: Mask ventilation without difficulty Laryngoscope Size: Mac and 3 Grade View: Grade I Tube type: Oral Tube size: 8.5 mm Number of attempts: 1 Airway Equipment and Method: Stylet and LTA kit utilized Placement Confirmation: ETT inserted through vocal cords under direct vision,  positive ETCO2 and breath sounds checked- equal and bilateral Secured at: 21 cm Tube secured with: Tape Dental Injury: Teeth and Oropharynx as per pre-operative assessment

## 2017-11-01 ENCOUNTER — Encounter (HOSPITAL_COMMUNITY): Payer: Self-pay | Admitting: Cardiothoracic Surgery

## 2017-11-01 NOTE — Op Note (Signed)
NAME:  [name] [name], [name] [name]                    ACCOUNT NO.:  MEDICAL RECORD NO.:  [id number]  LOCATION:                                 FACILITY:  PHYSICIAN:  Ivin Poot, M.D.       DATE OF BIRTH:  DATE OF PROCEDURE:  10/31/2017 DATE OF DISCHARGE:                              OPERATIVE REPORT   OPERATION: 1. Fiberoptic video bronchoscopy. 2. EBUS (endoscopic bronchial ultrasound guided biopsy of mediastinal     node).  SURGEON:  Ivin Poot, M.D.  ANESTHESIA:  General.  PREOPERATIVE DIAGNOSES: 1. Mediastinal adenopathy. 2. Left hilar adenopathy in a patient with smoking history, heavy.  POSTOPERATIVE DIAGNOSES: 1. Mediastinal adenopathy. 2. Left hilar adenopathy in a patient with smoking history, heavy.  CLINICAL NOTE:  The patient is a 57 year old male with long smoking history, who was found to have enlarged mediastinal nodes of approximately 1 cm.  He had history of multiple upper respiratory infections.  I decided to wait and follow the patient with a 2nd CT scan in 6 months.  He returned after a longer period of time because he had been evaluated for chest pain with a cardiac CT scan and a stress test. On the followup CT scan, the lymph node showed enlargement and I recommended a bronchoscopic biopsy.  I described the procedure of video bronchoscopy and the EBUS with the patient including the indications and risks.  He agreed to proceed.  DESCRIPTION OF PROCEDURE:  The patient was brought to the operating room and placed supine on the operating table.  General anesthesia was induced.  Through the endotracheal tube, a video bronchoscope was passed.  The distal trachea and carina were normal.  The bronchoscope was passed down the right mainstem bronchus, which was normal.  The endobronchial segments of the right upper lobe, right middle, and right lower lobe were visualized, and no endobronchial lesions were noted. The bronchoscope was then passed down the left  mainstem bronchus.  There were no endobronchial lesions.  Washings were taken and sent for cytology.  The segments of the left lower lobe were visualized and no endobronchial lesions were noted.  Brushings were taken of the left lower lobe and sent for cytology.  The left upper lobe endobronchial segments were examined and there was no evidence of pathology, but washings were taken.  The bronchoscope was removed.  The EBUS scope was then inserted.  The 1.5 cm lymph node at the carina was visualized and several transbronchial needle aspirates were performed.  One of these was sent for quick prep, which showed no evidence of malignancy. There was minimal bleeding, and the bronchoscope was removed after irrigation of the endobronchial tree.  The patient was then reversed from anesthesia and returned to the recovery room in stable condition. A postop chest x-ray showed no abnormalities.     Ivin Poot, M.D.     PV/MEDQ  D:  10/31/2017  T:  11/01/2017  Job:  [id number]

## 2017-11-01 NOTE — Anesthesia Postprocedure Evaluation (Signed)
Anesthesia Post Note  Patient: [name] [name]  Procedure(s) Performed: VIDEO BRONCHOSCOPY WITH ENDOBRONCHIAL ULTRASOUND (N/A Chest)     Patient location during evaluation: PACU Anesthesia Type: General Level of consciousness: awake and alert Pain management: pain level controlled Vital Signs Assessment: post-procedure vital signs reviewed and stable Respiratory status: spontaneous breathing, nonlabored ventilation, respiratory function stable and patient connected to nasal cannula oxygen Cardiovascular status: blood pressure returned to baseline and stable Postop Assessment: no apparent nausea or vomiting Anesthetic complications: no    Last Vitals:  Vitals:   10/31/17 1330 10/31/17 1349  BP:  110/69  Pulse: 76 76  Resp: 17 18  Temp:  (!) 36.2 C  SpO2: 97% 98%    Last Pain:  Vitals:   10/31/17 1349  TempSrc: Oral                 Hever Castilleja S

## 2017-11-02 ENCOUNTER — Other Ambulatory Visit: Payer: Self-pay | Admitting: *Deleted

## 2017-11-02 DIAGNOSIS — J9859 Other diseases of mediastinum, not elsewhere classified: Secondary | ICD-10-CM

## 2017-11-08 ENCOUNTER — Ambulatory Visit (HOSPITAL_COMMUNITY): Payer: Medicare HMO

## 2017-11-09 ENCOUNTER — Encounter (HOSPITAL_COMMUNITY)
Admission: RE | Admit: 2017-11-09 | Discharge: 2017-11-09 | Disposition: A | Discharge status: Home / Self Care | Payer: Medicare HMO | Source: Ambulatory Visit | Attending: Cardiothoracic Surgery | Admitting: Cardiothoracic Surgery

## 2017-11-09 DIAGNOSIS — R918 Other nonspecific abnormal finding of lung field: Secondary | ICD-10-CM | POA: Diagnosis not present

## 2017-11-09 DIAGNOSIS — J9859 Other diseases of mediastinum, not elsewhere classified: Secondary | ICD-10-CM

## 2017-11-09 LAB — GLUCOSE, CAPILLARY: Glucose-Capillary: 106 mg/dL — ABNORMAL HIGH (ref 65–99)

## 2017-11-09 MED ORDER — FLUDEOXYGLUCOSE F - 18 (FDG) INJECTION
8.6000 | Freq: Once | INTRAVENOUS | Status: AC | PRN
  Administered 2017-11-09: 8.6 via INTRAVENOUS

## 2017-11-10 DIAGNOSIS — M47812 Spondylosis without myelopathy or radiculopathy, cervical region: Secondary | ICD-10-CM | POA: Diagnosis not present

## 2017-11-14 ENCOUNTER — Encounter: Payer: Medicare HMO | Admitting: Cardiothoracic Surgery

## 2017-11-16 ENCOUNTER — Other Ambulatory Visit: Payer: Self-pay | Admitting: *Deleted

## 2017-11-16 ENCOUNTER — Ambulatory Visit: Payer: Medicare HMO | Admitting: Thoracic Surgery (Cardiothoracic Vascular Surgery)

## 2017-11-16 ENCOUNTER — Encounter: Payer: Self-pay | Admitting: Thoracic Surgery (Cardiothoracic Vascular Surgery)

## 2017-11-16 VITALS — BP 145/74 | HR 64 | Resp 20 | Ht 66.0 in | Wt 172.0 lb

## 2017-11-16 DIAGNOSIS — R590 Localized enlarged lymph nodes: Secondary | ICD-10-CM

## 2017-11-16 DIAGNOSIS — R911 Solitary pulmonary nodule: Secondary | ICD-10-CM

## 2017-11-16 NOTE — H&P (View-Only) (Signed)
[address]       [place] [address]             [phone]       HPI: [name] [name] returns to discuss the results of his PET/CT  [name] [name] is a 57 year old gentleman with a history of heavy tobacco abuse, hypertension, remote seizure disorder, TIA in February 2018, sleep apnea, and mediastinal and left hilar adenopathy.  He was initially evaluated last summer.  A PET/CT showed some activity which was mild.  This was in the setting of recurrent infections.  He was supposed to come back for a repeat CT in 3 months but that was delayed.  He had a cardiac CT in February which showed increasing adenopathy.  That was confirmed with a CT of the chest.  He underwent endobronchial ultrasound by Dr. Prescott Gum on 10/31/2017.  There were atypical cells but the study was nondiagnostic.  He complains of some hip pain particularly on the right side and also some chest pain along the left costal margin anteriorly.  He also complains of blood in his stool.  Zubrod Score: At the time of surgery this patient's most appropriate activity status/level should be described as: [x]     0    Normal activity, no symptoms []     1    Restricted in physical strenuous activity but ambulatory, able to do out light work []     2    Ambulatory and capable of self care, unable to do work activities, up and about >50 % of waking hours                              []     3    Only limited self care, in bed greater than 50% of waking hours []     4    Completely disabled, no self care, confined to bed or chair []     5    Moribund Past Medical History:  Diagnosis Date  . Anxiety   . Asthma   . COPD (chronic obstructive pulmonary disease) ([place])   . DDD (degenerative disc disease)   . GERD (gastroesophageal reflux disease)   . Headache   . Hepatitis    in the late 70's   . HTN (hypertension)   . Mediastinal adenopathy   . Seizures ([place])    as of 2019 none for 20 years  . Sleep apnea    does  not  use cpap  . Stroke [facility])    TIA - Feb. 2018   Past Surgical History:  Procedure Laterality Date  . CERVICAL DISC SURGERY     X4  . CHOLECYSTECTOMY    . COLONOSCOPY  08/24/2011   Procedure: COLONOSCOPY;  Surgeon: Daneil Dolin, MD;  [facility];  Service: Endoscopy;  Laterality: N/A;  7:30AM  . ELBOW SURGERY     right  . VIDEO BRONCHOSCOPY WITH ENDOBRONCHIAL ULTRASOUND N/A 10/31/2017   Procedure: VIDEO BRONCHOSCOPY WITH ENDOBRONCHIAL ULTRASOUND;  Surgeon: Ivin Poot, MD;  Location: [facility] OR;  Service: Thoracic;  Laterality: N/A;    Current Outpatient Medications  Medication Sig Dispense Refill  . albuterol (PROVENTIL HFA;VENTOLIN HFA) 108 (90 BASE) MCG/ACT inhaler Inhale 2 puffs into the lungs every 6 (six) hours as needed for wheezing or shortness of breath.     Marland Kitchen albuterol (PROVENTIL) (2.5 MG/3ML) 0.083% nebulizer solution Take 2.5 mg by nebulization  every 6 (six) hours as needed for wheezing or shortness of breath.     . alprazolam (XANAX) 2 MG tablet Take 2 mg by mouth every 6 (six) hours as needed for sleep or anxiety.     Marland Kitchen amLODipine (NORVASC) 10 MG tablet Take 10 mg by mouth daily.    . cyclobenzaprine (FLEXERIL) 10 MG tablet Take 10 mg by mouth daily as needed for muscle spasms.     . fluticasone (FLONASE) 50 MCG/ACT nasal spray Place 2 sprays into both nostrils daily as needed for allergies.     Marland Kitchen levETIRAcetam (KEPPRA) 500 MG tablet Take 500 mg by mouth 2 (two) times daily.     Marland Kitchen lisinopril (PRINIVIL,ZESTRIL) 20 MG tablet Take 20 mg by mouth 2 (two) times daily.    Marland Kitchen MAGNESIUM PO Take 2 tablets by mouth daily.    . meclizine (ANTIVERT) 12.5 MG tablet Take 1 tablet (12.5 mg total) by mouth 3 (three) times daily as needed for dizziness. 30 tablet 0  . metoprolol tartrate (LOPRESSOR) 50 MG tablet Take 1 tablet by mouth one hour prior to CTA. (Patient taking differently: Take 50 mg by mouth See admin instructions. Take 50 mg by mouth one hour prior to CTA.) 180  tablet 3  . naproxen (NAPROSYN) 500 MG tablet Take 500 mg by mouth 2 (two) times daily with a meal.      . oxyCODONE (ROXICODONE) 15 MG immediate release tablet Take 15 mg by mouth 4 (four) times daily as needed for pain.     . tamsulosin (FLOMAX) 0.4 MG CAPS capsule Take 0.4 mg by mouth daily.     Marland Kitchen aspirin EC 81 MG tablet Take 1 tablet (81 mg total) by mouth daily. (Patient not taking: Reported on 11/16/2017) 30 tablet 0  . Omega-3 Fatty Acids (FISH OIL) 1000 MG CAPS Take 1,000 mg by mouth 2 (two) times daily.      No current facility-administered medications for this visit.     Physical Exam BP (!) 145/74   Pulse 64   Resp 20   Ht 5\' 6"  (1.676 m)   Wt 172 lb (78 kg)   SpO2 98% Comment: RA  BMI 27.4 kg/m  57 year old man in no acute distress Alert and oriented x3 with no focal deficits Lungs clear with no wheezing bilaterally No cervical or supraclavicular adenopathy Cardiac regular rate and rhythm normal S1 and S2 Abdomen soft nontender Extremities no clubbing cyanosis or edema  Diagnostic Tests: NUCLEAR MEDICINE PET SKULL BASE TO THIGH  TECHNIQUE: 8.6 mCi F-18 FDG was injected intravenously. Full-ring PET imaging was performed from the skull base to thigh after the radiotracer. CT data was obtained and used for attenuation correction and anatomic localization.  Fasting blood glucose: 106 mg/dl  Mediastinal blood pool activity: SUV max 2.75  COMPARISON:  None.  FINDINGS: NECK: No hypermetabolic lymph nodes in the neck.  Incidental CT findings: none  CHEST: Stable small subpleural nodular densities in the left upper without hypermetabolism.  Left central lung lesion with left hilar and mediastinal lymphadenopathy is hypermetabolic with SUV max of 29.9. There is mass effect on the left lower lobe bronchus and the lesion may be partially intrabronchial.  No new pulmonary nodules to suggest pulmonary metastatic disease. No pleural  effusion.  Incidental CT findings: none  ABDOMEN/PELVIS: Vague 10 mm low-attenuation lesion in the right hepatic lobe on image number 242 is hypermetabolic with SUV max of 5.4 and worrisome for a hepatic metastatic lesion. There is an  even smaller lesion more medially which has an SUV max of 4.5 and is likely an other metastatic focus.  No enlarged or hypermetabolic mesenteric or retroperitoneal lymph nodes and no adrenal gland lesions.  In the pelvis is an area of hypermetabolism involving the sigmoid colon. There could be an inflamed diverticulum or possible epiploic appendagitis. I do not think this represents a neoplastic process. SUV max is 13.8. Correlation with the patient's colon cancer screening history is recommended. If screening is not up-to-date, appropriate screening should be considered.  Incidental CT findings: none  SKELETON: Diffuse osseous metastatic disease is demonstrated. Numerous lesions involving the spine and pelvis. At L5 there are lesions in the vertebral bodies, both pedicles and left transverse process and facet. No spinal canal compromise is demonstrated.  Incidental CT findings: none  IMPRESSION: 1. Left central lung mass compressing the left lower lobe bronchus with adjacent left hilar and mediastinal lymphadenopathy, all hypermetabolic and consistent with neoplasm. 2. Diffuse osseous metastatic disease. 3. Hepatic metastatic disease.   Electronically Signed   By: Marijo Sanes M.D.   On: 2020-01-118 12:06 I personally reviewed the CT and PET/CT images and concur with the findings noted above.  Impression: [name] is a 57 year old gentleman with a history of heavy tobacco abuse who has significant left hilar and mediastinal adenopathy.  His PET now shows that he also has osseous and likely hepatic metastases.  This most likely is a stage IV lung cancer.  Infectious or inflammatory etiologies are possible but far less  likely.  I recommended to Mr. Heidrich that we repeat his endobronchial ultrasound.  But now that we have the PET we can focus on the nodes that are hypermetabolic in the left hilum and the left paratracheal region.  If that is nondiagnostic, we would proceed with mediastinoscopy at the same setting.  That will allow [place] to reach the paratracheal node.  He is familiar with EBUS and I reviewed that procedure with him.  I also described the nature of mediastinoscopy including the incision to be used.  I informed him of the indications, risks, benefits, and alternatives.  He understands the risks include, but are not limited to death, MI, DVT, PE, bleeding, possible need for transfusion, possible need for sternotomy or thoracotomy, pneumothorax, infection, esophageal injury, recurrent nerve injury leading to hoarseness, as well as the possibility of other unforeseeable complications.  He understands all these complications are rare.  We will plan to do this as an outpatient  Plan: EBUS and possible mediastinoscopy on Wednesday, 11/22/2017.  Melrose Nakayama, MD [facility] [phone]

## 2017-11-16 NOTE — Progress Notes (Signed)
[address]       [place] [address]             [phone]       HPI: [name] returns to discuss the results of his PET/CT  [name] is a 57 year old gentleman with a history of heavy tobacco abuse, hypertension, remote seizure disorder, TIA in February 2018, sleep apnea, and mediastinal and left hilar adenopathy.  He was initially evaluated last summer.  A PET/CT showed some activity which was mild.  This was in the setting of recurrent infections.  He was supposed to come back for a repeat CT in 3 months but that was delayed.  He had a cardiac CT in February which showed increasing adenopathy.  That was confirmed with a CT of the chest.  He underwent endobronchial ultrasound by Dr. Prescott [name] on 10/31/2017.  There were atypical cells but the study was nondiagnostic.  He complains of some hip pain particularly on the right side and also some chest pain along the left costal margin anteriorly.  He also complains of blood in his stool.  Zubrod Score: At the time of surgery this patient's most appropriate activity status/level should be described as: [x]     0    Normal activity, no symptoms []     1    Restricted in physical strenuous activity but ambulatory, able to do out light work []     2    Ambulatory and capable of self care, unable to do work activities, up and about >50 % of waking hours                              []     3    Only limited self care, in bed greater than 50% of waking hours []     4    Completely disabled, no self care, confined to bed or chair []     5    Moribund Past Medical History:  Diagnosis Date  . Anxiety   . Asthma   . COPD (chronic obstructive pulmonary disease) ([place])   . DDD (degenerative disc disease)   . GERD (gastroesophageal reflux disease)   . Headache   . Hepatitis    in the late 70's   . HTN (hypertension)   . Mediastinal adenopathy   . Seizures ([name])    as of 2019 none for 20 years  . Sleep apnea    does  not  use cpap  . Stroke [facility])    TIA - Feb. 2018   Past Surgical History:  Procedure Laterality Date  . CERVICAL DISC SURGERY     X4  . CHOLECYSTECTOMY    . COLONOSCOPY  08/24/2011   Procedure: COLONOSCOPY;  Surgeon: [name] Dolin, MD;  [facility];  Service: Endoscopy;  Laterality: N/A;  7:30AM  . ELBOW SURGERY     right  . VIDEO BRONCHOSCOPY WITH ENDOBRONCHIAL ULTRASOUND N/A 10/31/2017   Procedure: VIDEO BRONCHOSCOPY WITH ENDOBRONCHIAL ULTRASOUND;  Surgeon: [name] Poot, MD;  Location: [facility] OR;  Service: Thoracic;  Laterality: N/A;    Current Outpatient Medications  Medication Sig Dispense Refill  . albuterol (PROVENTIL HFA;VENTOLIN HFA) 108 (90 BASE) MCG/ACT inhaler Inhale 2 puffs into the lungs every 6 (six) hours as needed for wheezing or shortness of breath.     Marland Kitchen albuterol (PROVENTIL) (2.5 MG/3ML) 0.083% nebulizer solution Take 2.5 mg by nebulization  every 6 (six) hours as needed for wheezing or shortness of breath.     . alprazolam (XANAX) 2 MG tablet Take 2 mg by mouth every 6 (six) hours as needed for sleep or anxiety.     Marland Kitchen amLODipine (NORVASC) 10 MG tablet Take 10 mg by mouth daily.    . cyclobenzaprine (FLEXERIL) 10 MG tablet Take 10 mg by mouth daily as needed for muscle spasms.     . fluticasone (FLONASE) 50 MCG/ACT nasal spray Place 2 sprays into both nostrils daily as needed for allergies.     Marland Kitchen levETIRAcetam (KEPPRA) 500 MG tablet Take 500 mg by mouth 2 (two) times daily.     Marland Kitchen lisinopril (PRINIVIL,ZESTRIL) 20 MG tablet Take 20 mg by mouth 2 (two) times daily.    Marland Kitchen MAGNESIUM PO Take 2 tablets by mouth daily.    . meclizine (ANTIVERT) 12.5 MG tablet Take 1 tablet (12.5 mg total) by mouth 3 (three) times daily as needed for dizziness. 30 tablet 0  . metoprolol tartrate (LOPRESSOR) 50 MG tablet Take 1 tablet by mouth one hour prior to CTA. (Patient taking differently: Take 50 mg by mouth See admin instructions. Take 50 mg by mouth one hour prior to CTA.) 180  tablet 3  . naproxen (NAPROSYN) 500 MG tablet Take 500 mg by mouth 2 (two) times daily with a meal.      . oxyCODONE (ROXICODONE) 15 MG immediate release tablet Take 15 mg by mouth 4 (four) times daily as needed for pain.     . tamsulosin (FLOMAX) 0.4 MG CAPS capsule Take 0.4 mg by mouth daily.     Marland Kitchen aspirin EC 81 MG tablet Take 1 tablet (81 mg total) by mouth daily. (Patient not taking: Reported on 11/16/2017) 30 tablet 0  . Omega-3 Fatty Acids (FISH OIL) 1000 MG CAPS Take 1,000 mg by mouth 2 (two) times daily.      No current facility-administered medications for this visit.     Physical Exam BP (!) 145/74   Pulse 64   Resp 20   Ht 5\' 6"  (1.676 m)   Wt 172 lb (78 kg)   SpO2 98% Comment: RA  BMI 27.87 kg/m  57 year old man in no acute distress Alert and oriented x3 with no focal deficits Lungs clear with no wheezing bilaterally No cervical or supraclavicular adenopathy Cardiac regular rate and rhythm normal S1 and S2 Abdomen soft nontender Extremities no clubbing cyanosis or edema  Diagnostic Tests: NUCLEAR MEDICINE PET SKULL BASE TO THIGH  TECHNIQUE: 8.6 mCi F-18 FDG was injected intravenously. Full-ring PET imaging was performed from the skull base to thigh after the radiotracer. CT data was obtained and used for attenuation correction and anatomic localization.  Fasting blood glucose: 106 mg/dl  Mediastinal blood pool activity: SUV max 2.75  COMPARISON:  None.  FINDINGS: NECK: No hypermetabolic lymph nodes in the neck.  Incidental CT findings: none  CHEST: Stable small subpleural nodular densities in the left upper without hypermetabolism.  Left central lung lesion with left hilar and mediastinal lymphadenopathy is hypermetabolic with SUV max of 38.7. There is mass effect on the left lower lobe bronchus and the lesion may be partially intrabronchial.  No new pulmonary nodules to suggest pulmonary metastatic disease. No pleural  effusion.  Incidental CT findings: none  ABDOMEN/PELVIS: Vague 10 mm low-attenuation lesion in the right hepatic lobe on image number 564 is hypermetabolic with SUV max of 5.4 and worrisome for a hepatic metastatic lesion. There is an  even smaller lesion more medially which has an SUV max of 4.5 and is likely an other metastatic focus.  No enlarged or hypermetabolic mesenteric or retroperitoneal lymph nodes and no adrenal gland lesions.  In the pelvis is an area of hypermetabolism involving the sigmoid colon. There could be an inflamed diverticulum or possible epiploic appendagitis. I do not think this represents a neoplastic process. SUV max is 13.8. Correlation with the patient's colon cancer screening history is recommended. If screening is not up-to-date, appropriate screening should be considered.  Incidental CT findings: none  SKELETON: Diffuse osseous metastatic disease is demonstrated. Numerous lesions involving the spine and pelvis. At L5 there are lesions in the vertebral bodies, both pedicles and left transverse process and facet. No spinal canal compromise is demonstrated.  Incidental CT findings: none  IMPRESSION: 1. Left central lung mass compressing the left lower lobe bronchus with adjacent left hilar and mediastinal lymphadenopathy, all hypermetabolic and consistent with neoplasm. 2. Diffuse osseous metastatic disease. 3. Hepatic metastatic disease.   Electronically Signed   By: Marijo Sanes M.D.   On: 10/02/2017 12:06 I personally reviewed the CT and PET/CT images and concur with the findings noted above.  Impression: Mr. Gallery is a 57 year old gentleman with a history of heavy tobacco abuse who has significant left hilar and mediastinal adenopathy.  His PET now shows that he also has osseous and likely hepatic metastases.  This most likely is a stage IV lung cancer.  Infectious or inflammatory etiologies are possible but far less  likely.  I recommended to Mr. Asare that we repeat his endobronchial ultrasound.  But now that we have the PET we can focus on the nodes that are hypermetabolic in the left hilum and the left paratracheal region.  If that is nondiagnostic, we would proceed with mediastinoscopy at the same setting.  That will allow [place] to reach the paratracheal node.  He is familiar with EBUS and I reviewed that procedure with him.  I also described the nature of mediastinoscopy including the incision to be used.  I informed him of the indications, risks, benefits, and alternatives.  He understands the risks include, but are not limited to death, MI, DVT, PE, bleeding, possible need for transfusion, possible need for sternotomy or thoracotomy, pneumothorax, infection, esophageal injury, recurrent nerve injury leading to hoarseness, as well as the possibility of other unforeseeable complications.  He understands all these complications are rare.  We will plan to do this as an outpatient  Plan: EBUS and possible mediastinoscopy on Wednesday, 11/22/2017.  Melrose Nakayama, MD [facility] [phone]

## 2017-11-17 ENCOUNTER — Encounter: Payer: Medicare HMO | Admitting: Cardiothoracic Surgery

## 2017-11-20 ENCOUNTER — Encounter (HOSPITAL_COMMUNITY): Payer: Self-pay | Admitting: *Deleted

## 2017-11-20 NOTE — Progress Notes (Signed)
Denies chest pain or shortness of breath. Verbalized understanding of instructions.

## 2017-11-22 ENCOUNTER — Ambulatory Visit (HOSPITAL_COMMUNITY)
Admission: RE | Admit: 2017-11-22 | Discharge: 2017-11-22 | Disposition: A | Discharge status: Home / Self Care | Payer: Medicare HMO | Source: Ambulatory Visit | Attending: Thoracic Surgery (Cardiothoracic Vascular Surgery) | Admitting: Thoracic Surgery (Cardiothoracic Vascular Surgery)

## 2017-11-22 ENCOUNTER — Encounter (HOSPITAL_COMMUNITY)
Admission: RE | Disposition: A | Discharge status: Home / Self Care | Payer: Self-pay | Source: Ambulatory Visit | Attending: Thoracic Surgery (Cardiothoracic Vascular Surgery)

## 2017-11-22 ENCOUNTER — Ambulatory Visit (HOSPITAL_COMMUNITY): Payer: Medicare HMO | Admitting: Anesthesiology

## 2017-11-22 ENCOUNTER — Ambulatory Visit (HOSPITAL_COMMUNITY): Payer: Medicare HMO

## 2017-11-22 ENCOUNTER — Encounter (HOSPITAL_COMMUNITY): Payer: Self-pay | Admitting: Urology

## 2017-11-22 DIAGNOSIS — Z7982 Long term (current) use of aspirin: Secondary | ICD-10-CM | POA: Diagnosis not present

## 2017-11-22 DIAGNOSIS — Z9049 Acquired absence of other specified parts of digestive tract: Secondary | ICD-10-CM | POA: Insufficient documentation

## 2017-11-22 DIAGNOSIS — F419 Anxiety disorder, unspecified: Secondary | ICD-10-CM | POA: Insufficient documentation

## 2017-11-22 DIAGNOSIS — C801 Malignant (primary) neoplasm, unspecified: Secondary | ICD-10-CM | POA: Diagnosis not present

## 2017-11-22 DIAGNOSIS — R590 Localized enlarged lymph nodes: Secondary | ICD-10-CM

## 2017-11-22 DIAGNOSIS — Z7951 Long term (current) use of inhaled steroids: Secondary | ICD-10-CM | POA: Insufficient documentation

## 2017-11-22 DIAGNOSIS — G4730 Sleep apnea, unspecified: Secondary | ICD-10-CM | POA: Insufficient documentation

## 2017-11-22 DIAGNOSIS — K219 Gastro-esophageal reflux disease without esophagitis: Secondary | ICD-10-CM | POA: Insufficient documentation

## 2017-11-22 DIAGNOSIS — C383 Malignant neoplasm of mediastinum, part unspecified: Secondary | ICD-10-CM | POA: Insufficient documentation

## 2017-11-22 DIAGNOSIS — C771 Secondary and unspecified malignant neoplasm of intrathoracic lymph nodes: Secondary | ICD-10-CM | POA: Diagnosis not present

## 2017-11-22 DIAGNOSIS — J449 Chronic obstructive pulmonary disease, unspecified: Secondary | ICD-10-CM | POA: Diagnosis not present

## 2017-11-22 DIAGNOSIS — R918 Other nonspecific abnormal finding of lung field: Secondary | ICD-10-CM | POA: Diagnosis not present

## 2017-11-22 DIAGNOSIS — F17200 Nicotine dependence, unspecified, uncomplicated: Secondary | ICD-10-CM | POA: Diagnosis not present

## 2017-11-22 DIAGNOSIS — C7951 Secondary malignant neoplasm of bone: Secondary | ICD-10-CM | POA: Diagnosis not present

## 2017-11-22 DIAGNOSIS — Z8673 Personal history of transient ischemic attack (TIA), and cerebral infarction without residual deficits: Secondary | ICD-10-CM | POA: Insufficient documentation

## 2017-11-22 DIAGNOSIS — I10 Essential (primary) hypertension: Secondary | ICD-10-CM | POA: Insufficient documentation

## 2017-11-22 DIAGNOSIS — Z79899 Other long term (current) drug therapy: Secondary | ICD-10-CM | POA: Diagnosis not present

## 2017-11-22 DIAGNOSIS — E785 Hyperlipidemia, unspecified: Secondary | ICD-10-CM | POA: Diagnosis not present

## 2017-11-22 DIAGNOSIS — C787 Secondary malignant neoplasm of liver and intrahepatic bile duct: Secondary | ICD-10-CM | POA: Insufficient documentation

## 2017-11-22 DIAGNOSIS — G40909 Epilepsy, unspecified, not intractable, without status epilepticus: Secondary | ICD-10-CM | POA: Insufficient documentation

## 2017-11-22 DIAGNOSIS — R69 Illness, unspecified: Secondary | ICD-10-CM | POA: Diagnosis not present

## 2017-11-22 HISTORY — PX: VIDEO BRONCHOSCOPY WITH ENDOBRONCHIAL ULTRASOUND: SHX6177

## 2017-11-22 HISTORY — DX: Diverticulitis of intestine, part unspecified, without perforation or abscess without bleeding: K57.92

## 2017-11-22 LAB — COMPREHENSIVE METABOLIC PANEL
ALBUMIN: 3.8 g/dL (ref 3.5–5.0)
ALT: 18 U/L (ref 17–63)
ANION GAP: 10 (ref 5–15)
AST: 24 U/L (ref 15–41)
Alkaline Phosphatase: 101 U/L (ref 38–126)
BUN: 18 mg/dL (ref 6–20)
CALCIUM: 9.1 mg/dL (ref 8.9–10.3)
CO2: 20 mmol/L — AB (ref 22–32)
Chloride: 106 mmol/L (ref 101–111)
Creatinine, Ser: 1.05 mg/dL (ref 0.61–1.24)
GFR calc Af Amer: >60 mL/min (ref >60)
GFR calc non Af Amer: >60 mL/min (ref >60)
GLUCOSE: 96 mg/dL (ref 65–99)
POTASSIUM: 4.6 mmol/L (ref 3.5–5.1)
SODIUM: 136 mmol/L (ref 135–145)
Total Bilirubin: 0.8 mg/dL (ref 0.3–1.2)
Total Protein: 6.6 g/dL (ref 6.5–8.1)

## 2017-11-22 LAB — TYPE AND SCREEN
ABO/RH(D): A NEG
Antibody Screen: NEGATIVE

## 2017-11-22 LAB — CBC
HCT: 44.6 % (ref 39.0–52.0)
Hemoglobin: 14.9 g/dL (ref 13.0–17.0)
MCH: 30.2 pg (ref 26.0–34.0)
MCHC: 33.4 g/dL (ref 30.0–36.0)
MCV: 90.3 fL (ref 78.0–100.0)
PLATELETS: 245 10*3/uL (ref 150–400)
RBC: 4.94 MIL/uL (ref 4.22–5.81)
RDW: 13 % (ref 11.5–15.5)
WBC: 8.7 10*3/uL (ref 4.0–10.5)

## 2017-11-22 LAB — ABO/RH: ABO/RH(D): A NEG

## 2017-11-22 LAB — PROTIME-INR
INR: 1.01
Prothrombin Time: 13.2 seconds (ref 11.4–15.2)

## 2017-11-22 LAB — APTT: APTT: 31 s (ref 24–36)

## 2017-11-22 SURGERY — BRONCHOSCOPY, WITH EBUS
Anesthesia: General

## 2017-11-22 MED ORDER — ROCURONIUM BROMIDE 10 MG/ML (PF) SYRINGE
PREFILLED_SYRINGE | INTRAVENOUS | Status: AC
  Filled 2017-11-22: qty 5

## 2017-11-22 MED ORDER — OXYCODONE HCL 5 MG PO TABS: 5.0000 mg | ORAL_TABLET | Freq: Once | ORAL | Status: DC | PRN

## 2017-11-22 MED ORDER — ONDANSETRON HCL 4 MG/2ML IJ SOLN
INTRAMUSCULAR | Status: DC | PRN
  Administered 2017-11-22: 4 mg via INTRAVENOUS

## 2017-11-22 MED ORDER — EPINEPHRINE PF 1 MG/ML IJ SOLN
INTRAMUSCULAR | Status: AC
  Filled 2017-11-22: qty 1

## 2017-11-22 MED ORDER — SODIUM CHLORIDE 0.9% FLUSH: 3.0000 mL | Freq: Two times a day (BID) | INTRAVENOUS | Status: DC

## 2017-11-22 MED ORDER — LIDOCAINE HCL (CARDIAC) 20 MG/ML IV SOLN
INTRAVENOUS | Status: AC
  Filled 2017-11-22: qty 5

## 2017-11-22 MED ORDER — EPINEPHRINE PF 1 MG/ML IJ SOLN
INTRAMUSCULAR | Status: DC | PRN
  Administered 2017-11-22: 1 mg

## 2017-11-22 MED ORDER — ROCURONIUM BROMIDE 10 MG/ML (PF) SYRINGE
PREFILLED_SYRINGE | INTRAVENOUS | Status: DC | PRN
  Administered 2017-11-22: 50 mg via INTRAVENOUS

## 2017-11-22 MED ORDER — MIDAZOLAM HCL 5 MG/5ML IJ SOLN
INTRAMUSCULAR | Status: DC | PRN
  Administered 2017-11-22: 2 mg via INTRAVENOUS

## 2017-11-22 MED ORDER — PROPOFOL 10 MG/ML IV BOLUS
INTRAVENOUS | Status: AC
  Filled 2017-11-22: qty 20

## 2017-11-22 MED ORDER — OXYCODONE HCL 5 MG/5ML PO SOLN: 5.0000 mg | Freq: Once | ORAL | Status: DC | PRN

## 2017-11-22 MED ORDER — SODIUM CHLORIDE 0.9% FLUSH
3.0000 mL | INTRAVENOUS | Status: DC | PRN
Start: 2017-11-22 — End: 2017-11-22

## 2017-11-22 MED ORDER — SODIUM CHLORIDE 0.9 % IV SOLN: 250.0000 mL | INTRAVENOUS | Status: DC | PRN

## 2017-11-22 MED ORDER — ACETAMINOPHEN 325 MG PO TABS: 650.0000 mg | ORAL_TABLET | ORAL | Status: DC | PRN

## 2017-11-22 MED ORDER — PROPOFOL 10 MG/ML IV BOLUS
INTRAVENOUS | Status: DC | PRN
  Administered 2017-11-22: 150 mg via INTRAVENOUS

## 2017-11-22 MED ORDER — HYDROMORPHONE HCL 1 MG/ML IJ SOLN: 0.2500 mg | INTRAMUSCULAR | Status: DC | PRN

## 2017-11-22 MED ORDER — LACTATED RINGERS IV SOLN
INTRAVENOUS | Status: DC
  Administered 2017-11-22 (×2): via INTRAVENOUS

## 2017-11-22 MED ORDER — SUGAMMADEX SODIUM 200 MG/2ML IV SOLN
INTRAVENOUS | Status: DC | PRN
  Administered 2017-11-22: 200 mg via INTRAVENOUS

## 2017-11-22 MED ORDER — MIDAZOLAM HCL 2 MG/2ML IJ SOLN
INTRAMUSCULAR | Status: AC
  Filled 2017-11-22: qty 2

## 2017-11-22 MED ORDER — FENTANYL CITRATE (PF) 100 MCG/2ML IJ SOLN
INTRAMUSCULAR | Status: DC | PRN
  Administered 2017-11-22: 100 ug via INTRAVENOUS

## 2017-11-22 MED ORDER — LIDOCAINE 2% (20 MG/ML) 5 ML SYRINGE
INTRAMUSCULAR | Status: DC | PRN
  Administered 2017-11-22: 80 mg via INTRAVENOUS

## 2017-11-22 MED ORDER — DEXAMETHASONE SODIUM PHOSPHATE 10 MG/ML IJ SOLN
INTRAMUSCULAR | Status: DC | PRN
  Administered 2017-11-22: 10 mg via INTRAVENOUS

## 2017-11-22 MED ORDER — 0.9 % SODIUM CHLORIDE (POUR BTL) OPTIME
TOPICAL | Status: DC | PRN
  Administered 2017-11-22: 1000 mL

## 2017-11-22 MED ORDER — FENTANYL CITRATE (PF) 250 MCG/5ML IJ SOLN
INTRAMUSCULAR | Status: AC
  Filled 2017-11-22: qty 5

## 2017-11-22 MED ORDER — ONDANSETRON HCL 4 MG/2ML IJ SOLN
INTRAMUSCULAR | Status: AC
  Filled 2017-11-22: qty 2

## 2017-11-22 MED ORDER — CEFAZOLIN SODIUM-DEXTROSE 2-4 GM/100ML-% IV SOLN
2.0000 g | INTRAVENOUS | Status: AC
  Administered 2017-11-22: 2 g via INTRAVENOUS
  Filled 2017-11-22: qty 100

## 2017-11-22 MED ORDER — DEXAMETHASONE SODIUM PHOSPHATE 10 MG/ML IJ SOLN
INTRAMUSCULAR | Status: AC
  Filled 2017-11-22: qty 1

## 2017-11-22 MED ORDER — ACETAMINOPHEN 650 MG RE SUPP: 650.0000 mg | RECTAL | Status: DC | PRN

## 2017-11-22 SURGICAL SUPPLY — 58 items
ADH SKN CLS APL DERMABOND .7 (GAUZE/BANDAGES/DRESSINGS)
APPLIER CLIP LOGIC TI 5 (MISCELLANEOUS) IMPLANT
APR CLP MED LRG 33X5 (MISCELLANEOUS)
BRUSH CYTOL CELLEBRITY 1.5X140 (MISCELLANEOUS) IMPLANT
CANISTER SUCT 3000ML PPV (MISCELLANEOUS) ×3 IMPLANT
CLIP VESOCCLUDE MED 6/CT (CLIP) ×1 IMPLANT
CONT SPEC 4OZ CLIKSEAL STRL BL (MISCELLANEOUS) ×4 IMPLANT
COVER BACK TABLE 60X90IN (DRAPES) ×2 IMPLANT
COVER DOME SNAP 22 D (MISCELLANEOUS) ×2 IMPLANT
COVER SURGICAL LIGHT HANDLE (MISCELLANEOUS) ×2 IMPLANT
DERMABOND ADVANCED (GAUZE/BANDAGES/DRESSINGS)
DERMABOND ADVANCED .7 DNX12 (GAUZE/BANDAGES/DRESSINGS) ×1 IMPLANT
DRAPE CHEST BREAST 15X10 FENES (DRAPES) ×1 IMPLANT
ELECT REM PT RETURN 9FT ADLT (ELECTROSURGICAL)
ELECTRODE REM PT RTRN 9FT ADLT (ELECTROSURGICAL) ×1 IMPLANT
FILTER STRAW FLUID ASPIR (MISCELLANEOUS) IMPLANT
FORCEPS BIOP RJ4 1.8 (CUTTING FORCEPS) IMPLANT
FORCEPS RADIAL JAW LRG 4 PULM (INSTRUMENTS) IMPLANT
GAUZE SPONGE 4X4 12PLY STRL (GAUZE/BANDAGES/DRESSINGS) IMPLANT
GAUZE SPONGE 4X4 16PLY XRAY LF (GAUZE/BANDAGES/DRESSINGS) ×1 IMPLANT
GLOVE SURG SIGNA 7.5 PF LTX (GLOVE) ×4 IMPLANT
GOWN STRL REUS W/ TWL LRG LVL3 (GOWN DISPOSABLE) ×1 IMPLANT
GOWN STRL REUS W/ TWL XL LVL3 (GOWN DISPOSABLE) ×2 IMPLANT
GOWN STRL REUS W/TWL LRG LVL3 (GOWN DISPOSABLE) ×2
GOWN STRL REUS W/TWL XL LVL3 (GOWN DISPOSABLE) ×2
HEMOSTAT SURGICEL 2X14 (HEMOSTASIS) IMPLANT
KIT BASIN OR (CUSTOM PROCEDURE TRAY) ×1 IMPLANT
KIT CLEAN ENDO COMPLIANCE (KITS) ×4 IMPLANT
KIT ROOM TURNOVER OR (KITS) ×3 IMPLANT
MARKER SKIN DUAL TIP RULER LAB (MISCELLANEOUS) ×2 IMPLANT
NDL BLUNT 18X1 FOR OR ONLY (NEEDLE) IMPLANT
NDL ECHOTIP HI DEF 22GA (NEEDLE) ×1 IMPLANT
NEEDLE BLUNT 18X1 FOR OR ONLY (NEEDLE) IMPLANT
NEEDLE ECHOTIP HI DEF 22GA (NEEDLE) ×2 IMPLANT
NS IRRIG 1000ML POUR BTL (IV SOLUTION) ×4 IMPLANT
OIL SILICONE PENTAX (PARTS (SERVICE/REPAIRS)) ×2 IMPLANT
PACK GENERAL/GYN (CUSTOM PROCEDURE TRAY) ×1 IMPLANT
PAD ARMBOARD 7.5X6 YLW CONV (MISCELLANEOUS) ×6 IMPLANT
RADIAL JAW LRG 4 PULMONARY (INSTRUMENTS)
SPONGE INTESTINAL PEANUT (DISPOSABLE) IMPLANT
SUT SILK 2 0 (SUTURE)
SUT SILK 2-0 18XBRD TIE 12 (SUTURE) IMPLANT
SUT VIC AB 2-0 CT1 27 (SUTURE)
SUT VIC AB 2-0 CT1 TAPERPNT 27 (SUTURE) IMPLANT
SUT VIC AB 3-0 SH 18 (SUTURE) ×1 IMPLANT
SUT VICRYL 4-0 PS2 18IN ABS (SUTURE) ×1 IMPLANT
SYR 10ML LL (SYRINGE) ×2 IMPLANT
SYR 20CC LL (SYRINGE) ×2 IMPLANT
SYR 20ML ECCENTRIC (SYRINGE) ×3 IMPLANT
SYR 3ML LL SCALE MARK (SYRINGE) ×1 IMPLANT
SYR 5ML LL (SYRINGE) ×2 IMPLANT
SYR 5ML LUER SLIP (SYRINGE) ×2 IMPLANT
TOWEL GREEN STERILE (TOWEL DISPOSABLE) ×3 IMPLANT
TOWEL GREEN STERILE FF (TOWEL DISPOSABLE) ×3 IMPLANT
TRAP SPECIMEN MUCOUS 40CC (MISCELLANEOUS) ×2 IMPLANT
TUBE CONNECTING 20X1/4 (TUBING) ×3 IMPLANT
VALVE DISPOSABLE (MISCELLANEOUS) ×3 IMPLANT
WATER STERILE IRR 1000ML POUR (IV SOLUTION) ×4 IMPLANT

## 2017-11-22 NOTE — Op Note (Signed)
[name], [name]               ACCOUNT NO.:  [id number]  MEDICAL RECORD NO.:  [id number]  LOCATION:                                 FACILITY:  PHYSICIAN:  Revonda Standard. Roxan Hockey, M.D. DATE OF BIRTH:  DATE OF PROCEDURE:  11/22/2017 DATE OF DISCHARGE:                              OPERATIVE REPORT   PREOPERATIVE DIAGNOSIS:  Left hilar mass with mediastinal and hilar adenopathy, clinical stage IV.  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  PROCEDURE:   Video bronchoscopy, Endobronchial ultrasound with mediastinal lymph node aspirations.  SURGEON:  Revonda Standard. Roxan Hockey, MD.  ASSISTANT:  None.  ANESTHESIA:  General.  FINDINGS:  No endobronchial lesions seen to the level of the subsegmental bronchi.  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  CLINICAL NOTE:  [name] is a 57 year old gentleman with a history of heavy tobacco abuse, who has been found to have hilar and mediastinal adenopathy.  Recently, a CT showed progression of disease.  Dr. Prescott Gum performed endobronchial ultrasound, there were atypical cells on path but it was nondiagnostic.  A PET-CT was done, which showed significant hypermetabolic activity in the left hilum, mediastinal lymph nodes as well as diffuse osseous metastasis.  He was advised to undergo repeat endobronchial ultrasound with the plan for mediastinoscopy if the EBUS was nondiagnostic.  The indications, risks, benefits, alternatives, and intraoperative decision making were discussed with the patient.  He understood and accepted the risks and agreed to proceed.  OPERATIVE NOTE:  [name] was brought to the operating room on November 22, 2017.  He was anesthetized and intubated.  A time-out was performed. Intravenous antibiotics were administered.  Sequential compression devices  were placed on the calves for DVT prophylaxis.  Flexible fiberoptic bronchoscopy was performed via the endotracheal tube.  No endobronchial lesions were seen to the level of subsegmental bronchi, but there was some extrinsic compression on the distal trachea in the left paratracheal area and there was also some extrinsic compression around the lower lobe bronchus.  The endobronchial ultrasound probe was advanced and a large 4L node was identified.  There were actually 2 adjacent nodes in this area.  Needle aspirations were performed while visualizing with the ultrasound. Specimens were obtained both with and without suction applied and the specimens were placed on slides as well as into cytologic preparation fluid.  The slides were sent for quick prep.  While awaiting those results, a level 10L node was identified and multiple aspirations were performed at that site as well and finally multiple aspirations were performed at the 11L site.  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  The remainder of the specimens were all sent for permanent pathology.  The scope was brought back to the 4L node and multiple additional aspirations were performed with all of the specimen being placed in cytologic preparation fluid for cell block.  Final inspection was made.  There was no bleeding.  The ultrasound probe was removed.  The patient was extubated in the operating  room and taken to the postanesthetic care unit in good condition.     Revonda Standard Roxan Hockey, M.D.     SCH/MEDQ  D:  11/22/2017  T:  11/22/2017  Job:  [id number]

## 2017-11-22 NOTE — Transfer of Care (Signed)
Immediate Anesthesia Transfer of Care Note  Patient: [name] [name]  Procedure(s) Performed: VIDEO BRONCHOSCOPY WITH ENDOBRONCHIAL ULTRASOUND (N/A )  Patient Location: PACU  Anesthesia Type:General  Level of Consciousness: awake, patient cooperative and responds to stimulation  Airway & Oxygen Therapy: Patient Spontanous Breathing and Patient connected to nasal cannula oxygen  Post-op Assessment: Report given to RN and Post -op Vital signs reviewed and stable  Post vital signs: Reviewed and stable  Last Vitals:  Vitals:   11/22/17 1131 11/22/17 1515  BP: 118/69 113/73  Pulse: 64 74  Resp: 20 10  Temp: 36.8 C (P) 36.5 C  SpO2: 98% 100%    Last Pain:  Vitals:   11/22/17 1157  TempSrc:   PainSc: 5       Patients Stated Pain Goal: 5 (27/07/86 7544)  Complications: No apparent anesthesia complications

## 2017-11-22 NOTE — Discharge Instructions (Addendum)
Do not drive or engage in heavy physical activity for 24 hours  You may resume normal activities tomorrow  You may cough up small amounts of blood over the next few days  You may use acetaminophen (Tylenol) if needed for discomfort  Call [phone] if you develop chest pain, shortness of breath, fever > 101 F or cough up more than 2 tablespoons of blood  An appointment will be arranged for you with Oncology to discuss treatment

## 2017-11-22 NOTE — Interval H&P Note (Signed)
History and Physical Interval Note:  11/22/2017 1:12 PM  [name] [name]  has presented today for surgery, with the diagnosis of MEDIASTINAL & HILAR ADENOPATHY  The various methods of treatment have been discussed with the patient and family. After consideration of risks, benefits and other options for treatment, the patient has consented to  Procedure(s): VIDEO BRONCHOSCOPY WITH ENDOBRONCHIAL ULTRASOUND (N/A) possible MEDIASTINOSCOPY (N/A) as a surgical intervention .  The patient's history has been reviewed, patient examined, no change in status, stable for surgery.  I have reviewed the patient's chart and labs.  Questions were answered to the patient's satisfaction.     Melrose Nakayama

## 2017-11-22 NOTE — Brief Op Note (Signed)
11/22/2017  3:08 PM  PATIENT:  [name] [name]  57 y.o. 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, clinical stage IV (T3, N2, M1)  PROCEDURE:  Procedure(s): VIDEO BRONCHOSCOPY WITH ENDOBRONCHIAL ULTRASOUND (N/A)  SURGEON:  Surgeon(s) and Role:    * Melrose Nakayama, MD - Primary  PHYSICIAN ASSISTANT:   ASSISTANTS: none   ANESTHESIA:   general  EBL:  Minimal  BLOOD ADMINISTERED:none  DRAINS: none   LOCAL MEDICATIONS USED:  NONE  SPECIMEN:  Source of Specimen:  lymph node aspirations  DISPOSITION OF SPECIMEN:  PATHOLOGY  COUNTS:  NO Endoscopic  TOURNIQUET:  * No tourniquets in log *  DICTATION: .Other Dictation: Dictation Number -  PLAN OF CARE: Discharge to home after PACU  PATIENT DISPOSITION:  PACU - hemodynamically stable.   Delay start of Pharmacological VTE agent (>24hrs) due to surgical blood loss or risk of bleeding: not applicable   Findings:- no endobronchial mass seen. Aspirations of 4L node + malignancy. 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

## 2017-11-22 NOTE — Anesthesia Preprocedure Evaluation (Addendum)
Anesthesia Evaluation  Patient identified by MRN, date of birth, ID band Patient awake    Reviewed: Allergy & Precautions, NPO status , Patient's Chart, lab work & pertinent test results  History of Anesthesia Complications Negative for: history of anesthetic complications  Airway Mallampati: II  TM Distance: >3 FB Neck ROM: Full    Dental  (+) Missing, Poor Dentition, Dental Advisory Given   Pulmonary asthma , COPD, former smoker,    breath sounds clear to auscultation       Cardiovascular hypertension,  Rhythm:Regular Rate:Normal     Neuro/Psych    GI/Hepatic GERD  ,(+) Hepatitis -  Endo/Other  diabetes  Renal/GU      Musculoskeletal  (+) Arthritis ,   Abdominal   Peds  Hematology negative hematology ROS (+)   Anesthesia Other Findings   Reproductive/Obstetrics                            Anesthesia Physical Anesthesia Plan  ASA: III  Anesthesia Plan: General   Post-op Pain Management:    Induction: Intravenous  PONV Risk Score and Plan: 3 and Treatment may vary due to age or medical condition, Dexamethasone and Ondansetron  Airway Management Planned: Oral ETT  Additional Equipment:   Intra-op Plan:   Post-operative Plan: Extubation in OR  Informed Consent: I have reviewed the patients History and Physical, chart, labs and discussed the procedure including the risks, benefits and alternatives for the proposed anesthesia with the patient or authorized representative who has indicated his/her understanding and acceptance.   Dental advisory given  Plan Discussed with: CRNA  Anesthesia Plan Comments:         Anesthesia Quick Evaluation

## 2017-11-23 ENCOUNTER — Encounter: Payer: Self-pay | Admitting: *Deleted

## 2017-11-23 ENCOUNTER — Inpatient Hospital Stay (HOSPITAL_COMMUNITY): Payer: Medicare HMO | Attending: Hematology | Admitting: Hematology

## 2017-11-23 ENCOUNTER — Encounter (HOSPITAL_COMMUNITY): Payer: Self-pay | Admitting: Thoracic Surgery (Cardiothoracic Vascular Surgery)

## 2017-11-23 VITALS — BP 164/68 | HR 83 | Temp 98.8°F | Resp 18 | Ht 68.0 in | Wt 176.9 lb

## 2017-11-23 DIAGNOSIS — C7951 Secondary malignant neoplasm of bone: Secondary | ICD-10-CM

## 2017-11-23 DIAGNOSIS — C3490 Malignant neoplasm of unspecified part of unspecified bronchus or lung: Secondary | ICD-10-CM | POA: Insufficient documentation

## 2017-11-23 DIAGNOSIS — R066 Hiccough: Secondary | ICD-10-CM | POA: Diagnosis not present

## 2017-11-23 DIAGNOSIS — C3432 Malignant neoplasm of lower lobe, left bronchus or lung: Secondary | ICD-10-CM | POA: Diagnosis not present

## 2017-11-23 DIAGNOSIS — Z8673 Personal history of transient ischemic attack (TIA), and cerebral infarction without residual deficits: Secondary | ICD-10-CM | POA: Diagnosis not present

## 2017-11-23 DIAGNOSIS — K219 Gastro-esophageal reflux disease without esophagitis: Secondary | ICD-10-CM | POA: Diagnosis not present

## 2017-11-23 DIAGNOSIS — Z7189 Other specified counseling: Secondary | ICD-10-CM | POA: Insufficient documentation

## 2017-11-23 DIAGNOSIS — J449 Chronic obstructive pulmonary disease, unspecified: Secondary | ICD-10-CM | POA: Diagnosis not present

## 2017-11-23 DIAGNOSIS — I10 Essential (primary) hypertension: Secondary | ICD-10-CM | POA: Insufficient documentation

## 2017-11-23 DIAGNOSIS — Z87891 Personal history of nicotine dependence: Secondary | ICD-10-CM | POA: Insufficient documentation

## 2017-11-23 DIAGNOSIS — F419 Anxiety disorder, unspecified: Secondary | ICD-10-CM | POA: Insufficient documentation

## 2017-11-23 DIAGNOSIS — C787 Secondary malignant neoplasm of liver and intrahepatic bile duct: Secondary | ICD-10-CM

## 2017-11-23 DIAGNOSIS — R69 Illness, unspecified: Secondary | ICD-10-CM | POA: Diagnosis not present

## 2017-11-23 DIAGNOSIS — Z5111 Encounter for antineoplastic chemotherapy: Secondary | ICD-10-CM | POA: Diagnosis present

## 2017-11-23 DIAGNOSIS — Z79899 Other long term (current) drug therapy: Secondary | ICD-10-CM | POA: Diagnosis not present

## 2017-11-23 MED ORDER — CHLORPROMAZINE HCL 25 MG PO TABS
25.0000 mg | ORAL_TABLET | ORAL | Status: AC
  Administered 2017-11-23: 25 mg via ORAL
  Filled 2017-11-23: qty 1

## 2017-11-23 MED ORDER — PROCHLORPERAZINE MALEATE 10 MG PO TABS: 10.0000 mg | ORAL_TABLET | Freq: Four times a day (QID) | ORAL | 1 refills | Status: DC | PRN

## 2017-11-23 MED ORDER — ONDANSETRON HCL 8 MG PO TABS: 8.0000 mg | ORAL_TABLET | Freq: Two times a day (BID) | ORAL | 1 refills | Status: DC | PRN

## 2017-11-23 NOTE — Addendum Note (Signed)
Addended by: Joanne Gavel T on: 11/23/2017 06:22 PM   Modules accepted: Orders

## 2017-11-23 NOTE — Progress Notes (Signed)
Chemotherapy teaching pulled together. 

## 2017-11-23 NOTE — Progress Notes (Signed)
START ON PATHWAY REGIMEN - Small Cell Lung     Cycles 1 through 4, every 21 days:     Atezolizumab      Carboplatin      Etoposide    Cycles 5 and beyond, every 21 days:     Atezolizumab   **Always confirm dose/schedule in your pharmacy ordering system**    Patient Characteristics: Extensive Stage, First Line Stage Classification: Extensive AJCC T Category: TX AJCC N Category: NX AJCC M Category: Staged < 8th Ed. AJCC 8 Stage Grouping: IVB Line of therapy: First Line Would you be surprised if this patient died  in the next year<= I would NOT be surprised if this patient died in the next year Intent of Therapy: Non-Curative / Palliative Intent, Discussed with Patient

## 2017-11-23 NOTE — Progress Notes (Signed)
[facility] NOTE  Patient Care Team: Redmond School, MD as PCP - General (Internal Medicine) Lorretta Harp, MD as PCP - Cardiology (Cardiology) Gala Romney Cristopher Estimable, MD (Gastroenterology) Glenna Fellows, MD as Attending Physician (Neurosurgery)  CHIEF COMPLAINTS/PURPOSE OF CONSULTATION:  Small cell lung cancer  HISTORY OF PRESENTING ILLNESS:  [name] [name] 57 y.o. male is here because of newly diagnosed small cell lung cancer.  He had a PET/CT scan last summer in the setting of recurrent infections which showed some activity which was mild.  He had a cardiac CT in February which showed increasing adenopathy in the chest.  He underwent EBUS by Dr. Prescott Gum on 10/31/2017 which showed atypical cells on FNA of 4L lymph node station.  He was then referred to Dr. Roxan Hockey who repeated EBUS guided biopsy of lymph node stations 11L, 10L and 4L on 11/22/2017.  This was consistent with small cell lung cancer.  He also had a PET scan on 12-27-2017 which showed extensive bone metastasis.  He reports 67-month history of low back pain and left hip pain.  He also complains of constant headaches for the last few months.  He used to have on and off headaches prior to that.  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  He occasionally uses a cane to walk since he had 4 neck surgeries.  His appetite is unchanged.  No major weight loss reported.  MEDICAL HISTORY:  Past Medical History:  Diagnosis Date  . Anxiety   . Asthma   . COPD (chronic obstructive pulmonary disease) ([place])   . DDD (degenerative disc disease)   . Diverticulitis   . GERD (gastroesophageal reflux disease)   . Headache   . Hepatitis    in the late 70's   . HTN (hypertension)   . Mediastinal adenopathy   . Seizures ([place])    as of 2019 none for 20 years  . Sleep apnea    does not  use cpap  . Stroke [facility])    TIA - Feb. 2018    SURGICAL HISTORY: Past Surgical History:  Procedure Laterality Date  . CERVICAL  DISC SURGERY     X4  . CHOLECYSTECTOMY    . COLONOSCOPY  08/24/2011   Procedure: COLONOSCOPY;  Surgeon: Daneil Dolin, MD;  [facility];  Service: Endoscopy;  Laterality: N/A;  7:30AM  . ELBOW SURGERY     right  . VIDEO BRONCHOSCOPY WITH ENDOBRONCHIAL ULTRASOUND N/A 10/31/2017   Procedure: VIDEO BRONCHOSCOPY WITH ENDOBRONCHIAL ULTRASOUND;  Surgeon: Ivin Poot, MD;  Location: [place];  Service: Thoracic;  Laterality: N/A;  . VIDEO BRONCHOSCOPY WITH ENDOBRONCHIAL ULTRASOUND N/A 11/22/2017   Procedure: VIDEO BRONCHOSCOPY WITH ENDOBRONCHIAL ULTRASOUND;  Surgeon: Melrose Nakayama, MD;  Location: [place];  Service: Thoracic;  Laterality: N/A;    SOCIAL HISTORY: Social History   Socioeconomic History  . Marital status: Married    Spouse name: Not on file  . Number of children: Not on file  . Years of education: Not on file  . Highest education level: Not on file  Social Needs  . Financial resource strain: Not on file  . Food insecurity - worry: Not on file  . Food insecurity - inability: Not on file  . Transportation needs - medical: Not on file  . Transportation needs - non-medical: Not on file  Occupational History  . Occupation: disability  Tobacco Use  . Smoking status: Former Smoker    Packs/day: 1.50  Years: 30.00    Pack years: 45.00    Types: Cigarettes    Last attempt to quit: 10/13/2017    Years since quitting: 0.1  . Smokeless tobacco: Never Used  Substance and Sexual Activity  . Alcohol use: No  . Drug use: No  . Sexual activity: Yes    Birth control/protection: None  Other Topics Concern  . Not on file  Social History Narrative  . Not on file    FAMILY HISTORY: Family History  Problem Relation Age of Onset  . Hypertension Father   . Heart attack Father   . Kidney failure Mother   . Diabetes Mother   . Hypertension Mother   . Colon cancer Neg Hx   . Anesthesia problems Neg Hx   . Hypotension Neg Hx   . Malignant hyperthermia Neg Hx    . Pseudochol deficiency Neg Hx     ALLERGIES:  has No Known Allergies.  MEDICATIONS:  Current Outpatient Medications  Medication Sig Dispense Refill  . albuterol (PROVENTIL HFA;VENTOLIN HFA) 108 (90 BASE) MCG/ACT inhaler Inhale 2 puffs into the lungs every 6 (six) hours as needed for wheezing or shortness of breath.     Marland Kitchen albuterol (PROVENTIL) (2.5 MG/3ML) 0.083% nebulizer solution Take 2.5 mg by nebulization every 6 (six) hours as needed for wheezing or shortness of breath.     . alprazolam (XANAX) 2 MG tablet Take 2 mg by mouth every 6 (six) hours as needed for sleep or anxiety.     Marland Kitchen amLODipine (NORVASC) 10 MG tablet Take 10 mg by mouth daily.    Marland Kitchen aspirin EC 81 MG tablet Take 1 tablet (81 mg total) by mouth daily. (Patient not taking: Reported on 11/16/2017) 30 tablet 0  . ciprofloxacin (CIPRO) 500 MG tablet Take 500 mg by mouth 2 (two) times daily.    . cyclobenzaprine (FLEXERIL) 10 MG tablet Take 10 mg by mouth daily as needed for muscle spasms.     . fluticasone (FLONASE) 50 MCG/ACT nasal spray Place 2 sprays into both nostrils daily as needed for allergies.     . Glucosamine-Chondroit-Vit C-Mn (GLUCOSAMINE 1500 COMPLEX) CAPS Take 1 capsule by mouth.    . levETIRAcetam (KEPPRA) 500 MG tablet Take 500 mg by mouth 2 (two) times daily.     Marland Kitchen lisinopril (PRINIVIL,ZESTRIL) 20 MG tablet Take 20 mg by mouth 2 (two) times daily.    Marland Kitchen MAGNESIUM PO Take 2 tablets by mouth daily.    . meclizine (ANTIVERT) 12.5 MG tablet Take 1 tablet (12.5 mg total) by mouth 3 (three) times daily as needed for dizziness. 30 tablet 0  . metoprolol tartrate (LOPRESSOR) 50 MG tablet Take 1 tablet by mouth one hour prior to CTA. (Patient taking differently: Take 50 mg by mouth See admin instructions. Take 50 mg by mouth one hour prior to CTA.) 180 tablet 3  . MULTIPLE VITAMINS PO Take 1 tablet by mouth.    . naproxen (NAPROSYN) 500 MG tablet Take 500 mg by mouth 2 (two) times daily with a meal.      . Omega-3 Fatty  Acids (FISH OIL) 1000 MG CAPS Take 1,000 mg by mouth 2 (two) times daily.     . ondansetron (ZOFRAN) 8 MG tablet Take 1 tablet (8 mg total) by mouth 2 (two) times daily as needed for refractory nausea / vomiting. Start on day 3 after carboplatin chemo. 30 tablet 1  . oxyCODONE (ROXICODONE) 15 MG immediate release tablet Take 15 mg by mouth  4 (four) times daily as needed for pain.     Marland Kitchen prochlorperazine (COMPAZINE) 10 MG tablet Take 1 tablet (10 mg total) by mouth every 6 (six) hours as needed (Nausea or vomiting). 30 tablet 1  . tamsulosin (FLOMAX) 0.4 MG CAPS capsule Take 0.4 mg by mouth daily.     . TURMERIC PO Take 4 tablets by mouth.     No current facility-administered medications for this visit.     REVIEW OF SYSTEMS:   Constitutional: Denies fevers, chills or abnormal night sweats Eyes: Denies blurriness of vision, double vision or watery eyes Ears, nose, mouth, throat, and face: Denies mucositis or sore throat Respiratory: Has some cough but denies any hemoptysis. Cardiovascular: Denies palpitation, chest discomfort or lower extremity swelling Gastrointestinal:  Denies nausea, heartburn or change in bowel habits Skin: Denies abnormal skin rashes Lymphatics: Denies new lymphadenopathy or easy bruising Neurological:Denies numbness, tingling or new weaknesses Behavioral/Psych: Mood is stable, no new changes  All other systems were reviewed with the patient and are negative.  PHYSICAL EXAMINATION: ECOG PERFORMANCE STATUS: 1 - Symptomatic but completely ambulatory  Vitals:   11/23/17 1524  BP: (!) 164/68  Pulse: 83  Resp: 18  Temp: 98.8 F (37.1 C)  SpO2: 98%   Filed Weights   11/23/17 1524  Weight: 176 lb 14.4 oz (80.2 kg)    GENERAL:alert, no distress and comfortable SKIN: skin color, texture, turgor are normal, no rashes or significant lesions EYES: normal, conjunctiva are pink and non-injected, sclera clear OROPHARYNX:no exudate, no erythema and lips, buccal mucosa,  and tongue normal  NECK: supple, thyroid normal size, non-tender, without nodularity LYMPH:  no palpable lymphadenopathy in the cervical, axillary or inguinal LUNGS: clear to auscultation and percussion with normal breathing effort HEART: regular rate & rhythm and no murmurs and no lower extremity edema ABDOMEN:abdomen soft, non-tender and normal bowel sounds Musculoskeletal:no cyanosis of digits and no clubbing  PSYCH: alert & oriented x 3 with fluent speech NEURO: no focal motor/sensory deficits  LABORATORY DATA:  I have reviewed the data as listed Lab Results  Component Value Date   WBC 8.7 11/22/2017   HGB 14.9 11/22/2017   HCT 44.6 11/22/2017   MCV 90.3 11/22/2017   PLT 245 11/22/2017     Chemistry      Component Value Date/Time   NA 136 11/22/2017 1155   NA 143 10/11/2017 1349   K 4.6 11/22/2017 1155   CL 106 11/22/2017 1155   CO2 20 (L) 11/22/2017 1155   BUN 18 11/22/2017 1155   BUN 12 10/11/2017 1349   CREATININE 1.05 11/22/2017 1155      Component Value Date/Time   CALCIUM 9.1 11/22/2017 1155   ALKPHOS 101 11/22/2017 1155   AST 24 11/22/2017 1155   ALT 18 11/22/2017 1155   BILITOT 0.8 11/22/2017 1155       RADIOGRAPHIC STUDIES: I have personally reviewed the radiological images as listed and agreed with the findings in the report. Dg Chest 2 View  Result Date: 11/22/2017 CLINICAL DATA:  Preoperative evaluation for lung biopsy EXAM: CHEST - 2 VIEW COMPARISON:  Chest radiograph October 31, 2017 and PET-CT November 09, 2017 FINDINGS: The mass in the left hilar region measures approximately 2.5 x 2.4 cm and is better seen on recent PET study. There is no edema or consolidation. The heart size and pulmonary vascularity are normal. No adenopathy. There is postoperative change in the lower cervical spine. No blastic or lytic bone lesions are evident. IMPRESSION: Mass  in left hilar region, better seen on recent PET study. No edema or consolidation. Stable cardiac  silhouette. Electronically Signed   By: Lowella Grip III M.D.   On: 11/22/2017 12:07   Ct Chest Wo Contrast  Result Date: 10/25/2017 CLINICAL DATA:  Follow-up left upper lobe pulmonary nodule and left hilar adenopathy. EXAM: CT CHEST WITHOUT CONTRAST TECHNIQUE: Multidetector CT imaging of the chest was performed following the standard protocol without IV contrast. COMPARISON:  04/07/2017 chest CT. 10/20/2017 coronary CT. 12/01/2016 PET-CT. FINDINGS: Scan is motion degraded in the lower lungs, limiting assessment in this location. Cardiovascular: Normal heart size. No significant pericardial fluid/thickening. Minimally atherosclerotic nonaneurysmal thoracic aorta. Normal caliber pulmonary arteries. Mediastinum/Nodes: No discrete thyroid nodules. Unremarkable esophagus. No axillary adenopathy. There are multiple enlarged AP window nodes measuring up to 1.7 cm (series 2/image 60), increased from 1.0 cm on 04/07/2017, new since 12/01/2016 PET-CT, better delineated on the contrast enhanced coronary CT study from 10/20/2017. Enlarged 2.0 cm left hilar node (series 2/image 72), increased from 1.1 cm on 04/07/2017 chest CT and 0.8 cm on 12/01/2016 PET-CT, better delineated on the 10/20/2017 contrast enhanced coronary CT study. No right hilar adenopathy. No additional pathologically enlarged mediastinal nodes. Lungs/Pleura: No pneumothorax. No pleural effusion. Clustered lobulated peripheral left upper lobe pulmonary nodules, largest 1.1 cm (series 5/image 57), stable since 10/26/2016 chest CT. Diffuse bronchial wall thickening. No acute consolidative airspace disease, lung masses or new significant pulmonary nodules. Upper abdomen: Cholecystectomy.  No discrete adrenal nodules. Musculoskeletal: No aggressive appearing focal osseous lesions. Moderate thoracic spondylosis. IMPRESSION: 1. AP window and left hilar adenopathy, which has progressively increased on multiple imaging studies back to the 12/01/2016 PET-CT  study, where the left hilar node was seen to be hypermetabolic. These findings are worrisome for a primary bronchogenic malignancy such as due to small cell carcinoma. Multidisciplinary thoracic oncology consultation is advised. Follow-up PET-CT may be considered for further staging evaluation. 2. Clustered lobulated peripheral left upper lobe pulmonary nodules are stable for 12 months and were non hypermetabolic on 29/52/8413 PET-CT, most likely benign and unrelated to the AP window/left hilar adenopathy. 3. Diffuse bronchial wall thickening, suggesting chronic bronchitis. Aortic Atherosclerosis (ICD10-I70.0). These results will be called to the ordering clinician or representative by the Radiology Department at the imaging location. Electronically Signed   By: Ilona Sorrel M.D.   On: 10/25/2017 13:59   Nm Pet Image Initial (pi) Skull Base To Thigh  Result Date: 05-Mar-202019 CLINICAL DATA:  Subsequent treatment strategy for lymphadenopathy in the chest. EXAM: NUCLEAR MEDICINE PET SKULL BASE TO THIGH TECHNIQUE: 8.6 mCi F-18 FDG was injected intravenously. Full-ring PET imaging was performed from the skull base to thigh after the radiotracer. CT data was obtained and used for attenuation correction and anatomic localization. Fasting blood glucose: 106 mg/dl Mediastinal blood pool activity: SUV max 2.75 COMPARISON:  None. FINDINGS: NECK: No hypermetabolic lymph nodes in the neck. Incidental CT findings: none CHEST: Stable small subpleural nodular densities in the left upper without hypermetabolism. Left central lung lesion with left hilar and mediastinal lymphadenopathy is hypermetabolic with SUV max of 24.4. There is mass effect on the left lower lobe bronchus and the lesion may be partially intrabronchial. No new pulmonary nodules to suggest pulmonary metastatic disease. No pleural effusion. Incidental CT findings: none ABDOMEN/PELVIS: Vague 10 mm low-attenuation lesion in the right hepatic lobe on image number  010 is hypermetabolic with SUV max of 5.4 and worrisome for a hepatic metastatic lesion. There is an even smaller lesion  more medially which has an SUV max of 4.5 and is likely an other metastatic focus. No enlarged or hypermetabolic mesenteric or retroperitoneal lymph nodes and no adrenal gland lesions. In the pelvis is an area of hypermetabolism involving the sigmoid colon. There could be an inflamed diverticulum or possible epiploic appendagitis. I do not think this represents a neoplastic process. SUV max is 13.8. Correlation with the patient's colon cancer screening history is recommended. If screening is not up-to-date, appropriate screening should be considered. Incidental CT findings: none SKELETON: Diffuse osseous metastatic disease is demonstrated. Numerous lesions involving the spine and pelvis. At L5 there are lesions in the vertebral bodies, both pedicles and left transverse process and facet. No spinal canal compromise is demonstrated. Incidental CT findings: none IMPRESSION: 1. Left central lung mass compressing the left lower lobe bronchus with adjacent left hilar and mediastinal lymphadenopathy, all hypermetabolic and consistent with neoplasm. 2. Diffuse osseous metastatic disease. 3. Hepatic metastatic disease. Electronically Signed   By: Marijo Sanes M.D.   On: 2020/02/2118 12:06   Dg Chest Port 1 View  Result Date: 10/31/2017 CLINICAL DATA:  History of lung biopsy. EXAM: PORTABLE CHEST 1 VIEW COMPARISON:  Radiographs of October 26, 2016. FINDINGS: The heart size and mediastinal contours are within normal limits. Both lungs are clear. No pneumothorax or pleural effusion is noted. The visualized skeletal structures are unremarkable. IMPRESSION: No acute cardiopulmonary abnormality seen. Electronically Signed   By: Marijo Conception, M.D.   On: 10/31/2017 13:35    ASSESSMENT & PLAN:  1. Extensive stage small cell lung cancer: I have reviewed the results of the biopsy as well as PET/CT scan  with the patient and his wife in detail.  We talked about the normal prognosis of extensive stage small cell lung cancer which is measured in few months without treatment.  However this tumor is highly responsive to multiple chemotherapeutic drugs which dramatically prolong survival.  The addition of immunotherapy to chemotherapy back on meds further benefit.  Median survival in these patients is around 8-13 months.  Less than 5% of these patients survive beyond 2 years.  I have stressed to the patient and his wife that the treatment is given in the palliative setting.  We talked about the combination chemo immunotherapy with carboplatin, etoposide and Atezolizumab given every 21 days for 4 cycles followed by maintenance Atezolizumab until progression.  Median overall survival for the combination chemoimmunotherapy was 12.3 months versus 10.3 months for the chemotherapy group.  We talked about the toxicities including but not limited to nausea, vomiting, tiredness, nephrotoxicity, immunotherapy related side effects including colitis, pneumonitis, hepatitis, hypophysitis, skin rashes among others.  He understands and gives his permission to proceed with the treatment.  We plan to start his first cycle as soon as possible.  We will also plan to get a Port-A-Cath placed preferably prior to second cycle.  I have recommended doing an MRI of the brain with and without contrast to complete staging.  2.  Intractable hiccups: He developed hiccups for the last 2 hours which are not going away.  We have given 25 mg of Thorazine in our office and this subsided.  3.  I have spent 60 minutes with more than 70% of the time face-to-face discussing his new diagnosis, prognosis, treatment options including best supportive care.   Orders Placed This Encounter  Procedures  . MR Brain W Wo Contrast    Standing Status:   Future    Standing Expiration Date:  11/23/2018    Order Specific Question:   If indicated for the ordered  procedure, I authorize the administration of contrast media per Radiology protocol    Answer:   Yes    Order Specific Question:   What is the patient's sedation requirement?    Answer:   No Sedation    Order Specific Question:   Does the patient have a pacemaker or implanted devices?    Answer:   No    Order Specific Question:   Radiology Contrast Protocol - do NOT remove file path    Answer:   [email or web address]    Order Specific Question:   Preferred imaging location?    Answer:   [facility] (table limit-350lbs)  . CBC with Differential    Standing Status:   Standing    Number of Occurrences:   20    Standing Expiration Date:   11/24/2018  . Comprehensive metabolic panel    Standing Status:   Standing    Number of Occurrences:   20    Standing Expiration Date:   11/24/2018  . PHYSICIAN COMMUNICATION ORDER    TSH baseline and every 3rd cycle.      Derek Jack, MD 11/23/2017 4:42 PM

## 2017-11-23 NOTE — Progress Notes (Signed)
Oncology Nurse Navigator Documentation  Oncology Nurse Navigator Flowsheets 11/23/2017  Navigator Location [facility]  Referral date to RadOnc/MedOnc 11/23/2017  Navigator Encounter Type Other/I received referral from Dr. Leonarda Salon office. I completed referral and will update new patient coordinator to schedule at [facility].  I placed urgent referral due to small cell DX.   Treatment Phase Pre-Tx/Tx Discussion  Barriers/Navigation Needs Coordination of Care  Interventions Coordination of Care  Coordination of Care Other  Acuity Level 2  Time Spent with Patient 30

## 2017-11-23 NOTE — Patient Instructions (Signed)
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  We are going to treat you with 4 cycles of carboplatin and etoposide and tecentriq.  Then you will continue to get tecentriq until you either have progression or can no longer tolerate the drug. Carboplatin is given on day 1 every 3 weeks and etoposide (VP-16) is given on day 1, day 2, and day 3 every 3 weeks.  Tecentriq is given day 1 every 3 weeks. This treatment is with palliative intent, which means you are treatable but not curable.   You will see the doctor regularly throughout treatment.  We monitor your lab work prior to every treatment.  The doctor monitors your response to treatment by the way you are feeling, your blood work, and scans periodically.  During your treatment there will be wait times.  It is about 30 minutes to 1 hour wait for lab work to result.  Then there is a wait time for pharmacy to prepare your medications.    You will receive the following pre-medications prior to receiving chemotherapy: Premeds: Aloxi - high powered nausea/vomiting prevention medication used for chemotherapy patients.  Dexamethasone - steroid - given to reduce the risk of you having an allergic type reaction to the chemotherapy. Dex can cause you to feel energized, nervous/anxious/jittery, make you have trouble sleeping, and/or make you feel hot/flushed in the face/neck and/or look pink/red in the face/neck. These side effects will pass as the Dex wears off. (takes 20 minutes to infuse)   You will also receive neulasta after you have finished chemotherapy. Neulasta - this medication is not chemo but being given because you have had chemo. It is usually given 24-27 hours after the completion of chemotherapy. This medication works by boosting your bone marrow's supply of white blood cells. White blood cells are what protect our bodies against infection. The medication is given in  the form of a subcutaneous injection. It is given in the fatty tissue of your abdomen. It is a short needle. The major side effect of this medication is bone or muscle pain. The drug of choice to relieve or lessen the pain is Aleve or Ibuprofen. If a physician has ever told you not to take Aleve or Ibuprofen - then don't take it. You should then take Tylenol/acetaminophen. Take either medication as the bottle directs you to.  The level of pain you experience as a result of this injection can range from none, to mild or moderate, or severe. Please let us know if you develop moderate or severe bone pain.   You can take Claritin 10 mg over the counter for a few days after receiving neulasta to help with the bone aches and pains.    **DO NOT expose the Neulasta On-body injector to diagnostic imaging (CT scans, MRI, Ultrasound, X-ray), radiation treatment, or oxygen rich environments, such as hyperbaric chambers. **    POTENTIAL SIDE EFFECTS OF TREATMENT:  Etoposide (Generic Name) Other Names: VePesid, VP-16  About This Drug Etoposide is a drug used to treat cancer. This drug is given in the vein (IV) or by mouth.  This drug will take one hour to infuse.    Possible Side Effects (More Common) . Nausea and throwing up (vomiting). These symptoms may happen within one to six hours after getting this drug and may last up to 72 hours. Medicines are available to stop or lessen these side effects . Bone marrow  depression. This is a decrease in the number of white blood cells, red blood cells, and platelets. This may raise your risk of infection, make you tired and weak (fatigue), and raise your risk of bleeding. . Decreased appetite (decreased hunger) . Hair loss. Most patients lost hair on their scalp and body. You may notice hair thinning five to seven days after getting this drug. Often hair loss is temporary; your hair should grow back when treatment is done. . Fatigue  Possible Side Effects (Less  Common) . Loose bowel movements (diarrhea) that may last for several days . Soreness of the mouth and throat. You may have red areas, white patches, or sores that hurt. . Increased total bilirubin in your blood. This may mean that you have changes in your liver function. Your blood work will be checked by your doctor. . Effects on the nerves are called peripheral neuropathy. You may feel numbness, tingling, or pain in your hands and feet. It may be hard for you to button your clothes, open jars, or walk as usual. The effect on the nerves may get worse with more doses of the drug. These effects get better in some people after the drug is stopped but it does not get better in all people. . Rash . Blood pressure may be low while getting this drug in an IV.  Allergic Reactions Serious allergic reactions including anaphylaxis are rare. While you are getting this drug in your vein (IV), tell your nurse right away if you have any of these symptoms of an allergic reaction: . trouble catching your breath . feeling like your tongue or throat are swelling . feeling your heart beat quickly or in a not normal way (palpitations) . feeling dizzy or lightheaded . flushing, itching, rash, and/or hives  Treating Side Effects . If you are getting this drug in an IV, tell your nurse knowright away if you are feeling lightheaded or dizzy. . Talk with your nurse about getting a wig before you lose your hair. Also, call the [facility] at 800-ACS-2345 to find out information about the "Look Good, Feel Better" program close to where you live. It is a free program where women getting chemotherapy can learn about wigs, turbans and scarves as well as makeup techniques and skin and nail care. . Drink 6-8 cups of fluids each day unless your doctor has told you to limit your fluid intake due to some other health problem. A cup is 8 ounces of fluid. If you throw up or have loose bowel movements, you should drink  more fluids so that you do not become dehydrated (lack water in the body from losing too much fluid). . Mouth care is very important. Your mouth care should consist of regular, gentle cleaning of your teeth or dentures and rinsing your mouth with a mixture of  teaspoon of salt in 8 ounces of water or  teaspoon of sodium bicarbonate (baking soda) in 8 ounces of water. This should be done at least after every meal and at bedtime. . If you have mouth sores, avoid mouthwash that contains alcohol. Also avoid alcohol and smoking because they can bother your mouth and throat. . If you get a rash do not put anything on it unless your doctor or nurse says you may. Keep the area around the rash clean and dry. Let your doctor know right away if you get a rash while on this medicine. . Ask your doctor or nurse about medicine that is  available to help stop or lessen nausea or throwing up. . Be careful when cooking, walking, and handling sharp objects and hot liquids.  Food and Drug Interactions There are no known interactions of etoposide with food. This drug may interact with other medication. Tell your doctor and pharmacist about all the medication and dietary supplements (vitamins, minerals, herbs and others) that you are taking at this time. The safety and effectiveness of dietary supplements and alternative diets are often unknown. Using these might affect your cancer or interfere with your treatment. Until more is known, you should not use dietary supplements or alternative diets without your cancer doctor's help.  Important Information . Take etoposide pills one hour before eating or two to three hours after eating. . Store this drug in the refrigerator. . Missed dose: If you miss a dose, take it as soon as you remember, unless it is close to the time of your next dose. If it is close to your next dose, just take the next dose at your normal time. Do not take 2 doses at once. Do not take extra  doses.    When to Call the Doctor Call your doctor or nurse right away if you have any of these symptoms: . Trouble breathing or feeling short of breath . Fever of 100.5 F (38 C) or higher . Chills . Easy bleeding or bruising . Rash or itching . Feeling dizzy or lightheaded . Feeling that your heart is beating in a fast or not normal way (palpitations) . Loose bowel movements (diarrhea) more than 4 times a day or diarrhea with weakness or feeling lightheaded . Feeling confused . Nausea that stops you from eating or drinking . Throwing up more than 3 times a day Call your doctor or nurse as soon as possible if you have any of these symptoms: . Numbness, tingling, decreased feeling or weakness in fingers, toes, arms, or legs . Trouble walking or changes in the way you walk . Pain in your mouth or throat that makes it hard to eat or drink  Sexual Problems and Reproductive Concerns . Infertility warning: Sexual problems and reproduction concerns may happen. In both men and women, this drug may affect your ability to have children. This cannot be determined before your treatment. Talk with your doctor or nurse if you plan to have children. Ask for information on sperm or egg banking. . In men, this drug may interfere with your ability to make sperm, but it should not change your ability to have sexual relations. . In women, menstrual bleeding may become irregular or stop while you are getting this drug. Do not assume that you cannot become pregnant if you do not have a menstrual period. . Women may go through signs of menopause (change of life) like vaginal dryness or itching. Vaginal lubricants can be used to lessen vaginal dryness, itching, and pain during sexual relations. . Genetic counseling is available for you to talk about the effects of this drug therapy on future pregnancies. Also, a genetic counselor can look at the possible risk of problems in the unborn baby due to this medicine if  an exposure happens during pregnancy. . Pregnancy warning: This drug may have harmful effects on the unborn child, so effective methods of birth control should be used during your cancer treatment. Ask your doctor or nurse about effective methods of birth control. . Breast feeding warning: It is not known if this drug passes into breast milk. For this reason, women  should talk to their doctor about the risks and benefits of breast feeding during treatment with this drug because this drug may enter the breast milk and badly harm a breast feeding baby.   Carboplatin (Generic Name) Other Names: Paraplatin, CBDCA  About This Drug Carboplatin is a drug used to treat cancer. This drug is given in the vein (IV).  This drug will take 30 minutes to infuse.    Possible Side Effects (More Common) . Nausea and throwing up (vomiting). These symptoms may happen within a few hours after your treatment and may last up to 24 hours. Medicines are available to stop or lessen these side effects. . Bone marrow depression. This is a decrease in the number of white blood cells, red blood cells, and platelets. This may raise your risk of infection, make you tired and weak (fatigue), and raise your risk of bleeding. . Soreness of the mouth and throat. You may have red areas, white patches, or sores that hurt. . This drug may affect how your kidneys work. Your kidney function will be checked as needed. . Electrolyte changes. Your blood will be checked for electrolyte changes as needed.  Possible Side Effects (Less Common) . Hair loss. Some patients lose their hair on the scalp and body. You may notice your hair thinning seven to 14 days after getting this drug. . Effects on the nerves are called peripheral neuropathy. You may feel numbness, tingling, or pain in your hands and feet. It may be hard for you to button your clothes, open jars, or walk as usual. The effect on the nerves may get worse with more doses of the  drug. These effects get better in some people after the drug is stopped but it does not get better in all people. . Loose bowel movements (diarrhea) that may last for several days . Decreased hearing or ringing in the ears . Changes in the way food and drinks taste . Changes in liver function. Your liver function will be checked as needed.  Allergic Reactions Serious allergic reactions including anaphylaxis are rare. While you are getting this drug in your vein (IV), tell your nurse right away if you have any of these symptoms of an allergic reaction: . Trouble catching your breath . Feeling like your tongue or throat are swelling . Feeling your heart beat quickly or in a not normal way (palpitations) . Feeling dizzy or lightheaded . Flushing, itching, rash, and/or hives Treating Side Effects . Drink 6-8 cups of fluids each day unless your doctor has told you to limit your fluid intake due to some other health problem. A cup is 8 ounces of fluid. If you throw up or have loose bowel movements, you should drink more fluids so that you do not become dehydrated (lack water in the body from losing too much fluid). . Mouth care is very important. Your mouth care should consist of routine, gentle cleaning of your teeth or dentures and rinsing your mouth with a mixture of 1/2 teaspoon of salt in 8 ounces of water or  teaspoon of baking soda in 8 ounces of water. This should be done at least after each meal and at bedtime. . If you have mouth sores, avoid mouthwash that has alcohol. Avoid alcohol and smoking because they can bother your mouth and throat. . If you have numbness and tingling in your hands and feet, be careful when cooking, walking, and handling sharp objects and hot liquids. . Talk with your nurse about  getting a wig before you lose your hair. Also, call the [facility] at 800-ACS-2345 to find out information about the "Look Good, Feel Better" program close to where you live. It  is a free program where women getting chemotherapy can learn about wigs, turbans and scarves as well as makeup techniques and skin and nail care.  Food and Drug Interactions There are no known interactions of carboplatin with food. This drug may interact with other medicines. Tell your doctor and pharmacist about all the medicines and dietary supplements (vitamins, minerals, herbs and others) that you are taking at this time. The safety and use of dietary supplements and alternative diets are often not known. Using these might affect your cancer or interfere with your treatment. Until more is known, you should not use dietary supplements or alternative diets without your cancer doctor's help.  When to Call the Doctor Call your doctor or nurse right away if you have any of these symptoms: . Fever of 100.5 F (38 C) or above; chills . Bleeding or bruising that is not normal . Wheezing or trouble breathing . Nausea that stops you from eating or drinking . Throwing up more than once a day . Rash or itching . Loose bowel movements (diarrhea) more than four times a day or diarrhea with weakness or feeling lightheaded . Call your doctor or nurse as soon as possible if any of these symptoms happen: . Numbness, tingling, decreased feeling or weakness in fingers, toes, arms, or legs . Change in hearing, ringing in the ears . Blurred vision or other changes in eyesight . Decreased urine . Yellowing of skin or eyes  Sexual Problems and Reproductive Concerns Sexual problems and reproduction concerns may happen. In both men and women, this drug may affect your ability to have children. This cannot be determined before your treatment. Talk with your doctor or nurse if you plan to have children. Ask for information on sperm or egg banking. In men, this drug may interfere with your ability to make sperm, but it should not change your ability to have sexual relations. In women, menstrual bleeding may become  irregular or stop while you are getting this drug. Do not assume that you cannot become pregnant if you do not have a menstrual period. Women may go through signs of menopause (change of life) like vaginal dryness or itching. Vaginal lubricants can be used to lessen vaginal dryness, itching, and pain during sexual relations. Genetic counseling is available for you to talk about the effects of this drug therapy on future pregnancies. Also, a genetic counselor can look at the possible risk of problems in the unborn baby due to this medicine if an exposure happens during pregnancy. . Pregnancy warning: This drug may have harmful effects on the unborn child, so effective methods of birth control should be used during your cancer treatment. . Breast feeding warning: It is not known if this drug passes into breast milk. For this reason, women should talk to their doctor about the risks and benefits of breast feeding during treatment with this drug because this drug may enter the breast milk and badly harm a breast feeding baby.   Atezolizumab Gildardo Pounds)  About This Drug Huey Bienenstock is used to treat cancer. It is given by the vein (IV).  This will take 1 hour to infuse the first time and then the second and subsequent infusions will take 30 minutes to infuse.  Possible Side Effects . Tiredness . Decreased appetite (decreased hunger) .  Nausea . Constipation (not able to move bowels) . Loose bowel movements (diarrhea) . Urinary tract infection. Symptoms may include: . Pain or burning when you pass urine. . Feeling like you have to pass urine often, but not much comes out when you do. . Tender or heavy feeling in your lower abdomen. . Cloudy urine and/or urine that smells bad. . Pain on one side of your back under your ribs. This is where your kidneys are. . Fever, chills, nausea and/or throwing up. . Fever . Cough and/or trouble breathing . Muscle, bone and joint pain Note: Each of the side  effects above was reported in 20% or greater of patients treated with atezolizumab. Not all possible side effects are included above.  Warnings and Precautions . This drug works with your immune system and can cause inflammation in any of your organs and tissues and can change how they work. This may put you at risk for developing serious medical problems which can very rarely be fatal. . Inflammation (swelling) of the lungs which can very rarely be fatal. You may have a dry cough or trouble breathing. . Changes in your liver function. . Colitis. This is swelling (inflammation) in the colon - symptoms are loose bowel movements (diarrhea) stomach cramping, and sometimes blood in the bowel movements . Changes in your central nervous system can happen. The central nervous system is made up of your brain and spinal cord. You could feel extreme tiredness, agitation, confusion, hallucinations (see or hear things that are not there), trouble understanding or speaking, loss of control of your bowels or bladder, eyesight changes, numbness or lack of strength to your arms, legs, face, or body, and coma. If you start to have any of these symptoms let your doctor know right away. . This drug may affect some of your hormone glands (especially the thyroid, adrenals, pituitary and pancreas). . Blood sugar levels may change and you may develop diabetes. If you already have diabetes, changes may need to be made to your diabetes medication. . Inflammation of your pancreas. . Inflammation of your eyes and/or other changes in eyesight . Severe infections, including viral, bacterial and fungal, which can very rarely be fatal . While you are getting this drug in your vein (IV), you may have a reaction to the drug. Sometimes you may be given medication to stop or lessen these side effects. Your nurse will check you closely for these signs: fever or shaking chills, flushing, facial swelling, feeling dizzy, headache, trouble  breathing, rash, itching, chest tightness, or chest pain. These reactions may happen after your infusion. If this happens, call 911 for emergency care.  Important Information . This drug may be present in the saliva, tears, sweat, urine, stool, vomit, semen, and vaginal secretions. Talk to your doctor and/or your nurse about the necessary precautions to take during this time.  Treating Side Effects . Drink plenty of fluids (a minimum of eight glasses per day is recommended). . To help with decreased appetite, eat small, frequent meals . Eat high caloric food such as pudding, ice cream, yogurt and milkshakes. . Ask your doctor or nurse about medicine that is available to help stop or lessen the loose bowel movements, nausea and/or constipation. . If you are not able to move your bowels, check with your doctor or nurse before you use enemas, laxatives, or suppositories . If you throw up or have loose bowel movements, you should drink more fluids so that you do not become dehydrated (lack  water in the body from losing too much fluid). . To help with nausea and vomiting, eat small, frequent meals instead of three large meals a day. Choose foods and drinks that are at room temperature. Ask your nurse or doctor about other helpful tips and medicine that is available to help or stop lessen these symptoms. . If you get diarrhea, eat low-fiber foods that are high in protein and calories and avoid foods that can irritate your digestive tracts or lead to cramping. . Manage tiredness by pacing your activities for the day. Be sure to include periods of rest between energy-draining activities . If you're diabetic, keep good control of your blood sugar level. Tell your nurse or your doctor if your glucose levels are higher or lower than normal . Keeping your pain under control is important to your well-being. Please tell your doctor or nurse if you are experiencing pain. . Infusion reactions may happen for 24  hours after your infusion. If this happens, call 911 for emergency care.  Food and Drug Interactions . There are no known interactions of atezolizumab with food. . This drug may interact with other medicines. Tell your doctor and pharmacist about all the medicines and dietary supplements (vitamins, minerals, herbs and others) that you are taking at this time. The safety and use of dietary supplements and alternative diets are often not known. Using these might affect your cancer or interfere with your treatment. Until more is known, you should not use dietary supplements or alternative diets without your cancer doctor's help.  When to Call the Doctor Call your doctor or nurse if you have any of these symptoms and/or any new or unusual symptoms: . Fever of 100.5 F (38 C) or higher . Chills . Pain in your chest . Dry cough . Trouble breathing . Confusion and/or agitation . Hallucinations . Trouble understanding or speaking . Blurry vision or changes in your eyesight . Numbness or lack of strength to your arms, legs, face, or body . Blurred vision or other changes in eyesight . Loose bowel movements (diarrhea) 4 times or loose bowel movements with lack of strength or a feeling of being dizzy . Pain in your abdomen that does not go away . Blood in your stool . No bowel movement in 3 days or when you feel uncomfortable . Nausea that stops you from eating or drinking and/or is not relieved by prescribed medicines . Throwing up more than 3 times a day . Lasting loss of appetite or rapid weight loss of five pounds in a week . Pain or burning when you pass urine. . Difficulty urinating . Feeling like you have to pass urine often, but not much comes out when you do. . Tender or heavy feeling in your lower abdomen. . Cloudy urine and/or urine that smells bad. . Pain on one side of your back under your ribs. This is where your kidneys are. . Abnormal blood sugar . Unusual thirst, passing urine  often, headache, sweating, shakiness, irritability . Pain that does not go away, or is not relieved by prescribed medicines . Fatigue that interferes with your daily activities . Signs of infusion reaction: fever or shaking chills, flushing, facial swelling, feeling dizzy, headache, trouble breathing, rash, itching, chest tightness, or chest pain. . Signs of possible liver problems: dark urine, pale bowel movements, bad stomach pain, feeling very tired and weak, unusual itching, or yellowing of the eyes or skin . If you think you may be pregnant  Reproduction  Warnings . Pregnancy warning: This drug can have harmful effects on the unborn baby. Women of child bearing potential should use effective methods of birth control during your cancer treatment and for at least 5 months after treatment. Let your doctor know right away if you think you may be pregnant. . Breastfeeding warning: It is not known if this drug passes into breast milk. For this reason, Women should not breast feed during treatment and for at least 5 months after treatment because this drug could enter the breast milk and cause harm to a breast feeding baby. . Fertility warning: In women, this drug may affect your ability to have children in the future. Talk with your doctor or nurse if you plan to have children. Ask for information on egg banking.        SELF CARE ACTIVITIES WHILE ON CHEMOTHERAPY: Hydration Increase your fluid intake 48 hours prior to treatment and drink at least 8 to 12 cups (64 ounces) of water/decaff beverages per day after treatment. You can still have your cup of coffee or soda but these beverages do not count as part of your 8 to 12 cups that you need to drink daily. No alcohol intake.  Medications Continue taking your normal prescription medication as prescribed.  If you start any new herbal or new supplements please let us know first to make sure it is safe.  Mouth Care Have teeth cleaned  professionally before starting treatment. Keep dentures and partial plates clean. Use soft toothbrush and do not use mouthwashes that contain alcohol. Biotene is a good mouthwash that is available at most pharmacies or may be ordered by calling [phone]. Use warm salt water gargles (1 teaspoon salt per 1 quart warm water) before and after meals and at bedtime. Or you may rinse with 2 tablespoons of three-percent hydrogen peroxide mixed in eight ounces of water. If you are still having problems with your mouth or sores in your mouth please call the clinic. If you need dental work, please let the doctor know before you go for your appointment so that we can coordinate the best possible time for you in regards to your chemo regimen. You need to also let your dentist know that you are actively taking chemo. We may need to do labs prior to your dental appointment.  Skin Care Always use sunscreen that has not expired and with SPF (Sun Protection Factor) of 50 or higher. Wear hats to protect your head from the sun. Remember to use sunscreen on your hands, ears, face, & feet.  Use good moisturizing lotions such as udder cream, eucerin, or even Vaseline. Some chemotherapies can cause dry skin, color changes in your skin and nails.    . Avoid long, hot showers or baths. . Use gentle, fragrance-free soaps and laundry detergent. . Use moisturizers, preferably creams or ointments rather than lotions because the thicker consistency is better at preventing skin dehydration. Apply the cream or ointment within 15 minutes of showering. Reapply moisturizer at night, and moisturize your hands every time after you wash them.  Hair Loss (if your doctor says your hair will fall out)  . If your doctor says that your hair is likely to fall out, decide before you begin chemo whether you want to wear a wig. You may want to shop before treatment to match your hair color. . Hats, turbans, and scarves can also camouflage hair  loss, although some people prefer to leave their heads uncovered. If you go bare-headed  outdoors, be sure to use sunscreen on your scalp. . Cut your hair short. It eases the inconvenience of shedding lots of hair, but it also can reduce the emotional impact of watching your hair fall out. . Don't perm or color your hair during chemotherapy. Those chemical treatments are already damaging to hair and can enhance hair loss. Once your chemo treatments are done and your hair has grown back, it's OK to resume dyeing or perming hair. With chemotherapy, hair loss is almost always temporary. But when it grows back, it may be a different color or texture. In older adults who still had hair color before chemotherapy, the new growth may be completely gray.  Often, new hair is very fine and soft.  Infection Prevention Please wash your hands for at least 30 seconds using warm soapy water. Handwashing is the #1 way to prevent the spread of germs. Stay away from sick people or people who are getting over a cold. If you develop respiratory systems such as green/yellow mucus production or productive cough or persistent cough let us know and we will see if you need an antibiotic. It is a good idea to keep a pair of gloves on when going into grocery stores/[employer] to decrease your risk of coming into contact with germs on the carts, etc. Carry alcohol hand gel with you at all times and use it frequently if out in public. If your temperature reaches 100.5 or higher please call the clinic and let us know.  If it is after hours or on the weekend please go to the ER if your temperature is over 100.5.  Please have your own personal thermometer at home to use.    Sex and bodily fluids If you are going to have sex, a condom must be used to protect the person that isn't taking chemotherapy. Chemo can decrease your libido (sex drive). For a few days after chemotherapy, chemotherapy can be excreted through your bodily fluids.  When  using the toilet please close the lid and flush the toilet twice.  Do this for a few day after you have had chemotherapy.   Effects of chemotherapy on your sex life Some changes are simple and won't last long. They won't affect your sex life permanently. Sometimes you may feel: . too tired . not strong enough to be very active . sick or sore  . not in the mood . anxious or low Your anxiety might not seem related to sex. For example, you may be worried about the cancer and how your treatment is going. Or you may be worried about money, or about how you family are coping with your illness. These things can cause stress, which can affect your interest in sex. It's important to talk to your partner about how you feel. Remember - the changes to your sex life don't usually last long. There's usually no medical reason to stop having sex during chemo. The drugs won't have any long term physical effects on your performance or enjoyment of sex. Cancer can't be passed on to your partner during sex  Contraception It's important to use reliable contraception during treatment. Avoid getting pregnant while you or your partner are having chemotherapy. This is because the drugs may harm the baby. Sometimes chemotherapy drugs can leave a man or woman infertile.  This means you would not be able to have children in the future. You might want to talk to someone about permanent infertility. It can be very difficult to learn  that you may no longer be able to have children. Some people find counselling helpful. There might be ways to preserve your fertility, although this is easier for men than for women. You may want to speak to a fertility expert. You can talk about sperm banking or harvesting your eggs. You can also ask about other fertility options, such as donor eggs. If you have or have had breast cancer, your doctor might advise you not to take the contraceptive pill. This is because the hormones in it might affect  the cancer.  It is not known for sure whether or not chemotherapy drugs can be passed on through semen or secretions from the vagina. Because of this some doctors advise people to use a barrier method if you have sex during treatment. This applies to vaginal, anal or oral sex. Generally, doctors advise a barrier method only for the time you are actually having the treatment and for about a week after your treatment. Advice like this can be worrying, but this does not mean that you have to avoid being intimate with your partner. You can still have close contact with your partner and continue to enjoy sex.  Animals If you have cats or birds we just ask that you not change the litter or change the cage.  Please have someone else do this for you while you are on chemotherapy.   Food Safety During and After Cancer Treatment Food safety is important for people both during and after cancer treatment. Cancer and cancer treatments, such as chemotherapy, radiation therapy, and stem cell/bone marrow transplantation, often weaken the immune system. This makes it harder for your body to protect itself from foodborne illness, also called food poisoning. Foodborne illness is caused by eating food that contains harmful bacteria, parasites, or viruses.  Foods to avoid Some foods have a higher risk of becoming tainted with bacteria. These include: Marland Kitchen Unwashed fresh fruit and vegetables, especially leafy vegetables that can hide dirt and other contaminants . Raw sprouts, such as alfalfa sprouts . Raw or undercooked beef, especially ground beef, or other raw or undercooked meat and poultry . Fatty, fried, or spicy foods immediately before or after treatment.  These can sit heavy on your stomach and make you feel nauseous. . Raw or undercooked shellfish, such as oysters. . Sushi and sashimi, which often contain raw fish.  . Unpasteurized beverages, such as unpasteurized fruit juices, raw milk, raw yogurt, or  cider . Undercooked eggs, such as soft boiled, over easy, and poached; raw, unpasteurized eggs; or foods made with raw egg, such as homemade raw cookie dough and homemade mayonnaise Simple steps for food safety Shop smart. . Do not buy food stored or displayed in an unclean area. . Do not buy bruised or damaged fruits or vegetables. . Do not buy cans that have cracks, dents, or bulges. . Pick up foods that can spoil at the end of your shopping trip and store them in a cooler on the way home. Prepare and clean up foods carefully. . Rinse all fresh fruits and vegetables under running water, and dry them with a clean towel or paper towel. . Clean the top of cans before opening them. . After preparing food, wash your hands for 20 seconds with hot water and soap. Pay special attention to areas between fingers and under nails. . Clean your utensils and dishes with hot water and soap. Marland Kitchen Disinfect your kitchen and cutting boards using 1 teaspoon of liquid, unscented bleach mixed  into 1 quart of water.   Dispose of old food. . Eat canned and packaged food before its expiration date (the "use by" or "best before" date). . Consume refrigerated leftovers within 3 to 4 days. After that time, throw out the food. Even if the food does not smell or look spoiled, it still may be unsafe. Some bacteria, such as Listeria, can grow even on foods stored in the refrigerator if they are kept for too long.   Take precautions when eating out. . At restaurants, avoid buffets and salad bars where food sits out for a long time and comes in contact with many people. Food can become contaminated when someone with a virus, often a norovirus, or another "bug" handles it. . Put any leftover food in a "to-go" container yourself, rather than having the server do it. And, refrigerate leftovers as soon as you get home. . Choose restaurants that are clean and that are willing to prepare your food as you order it  cooked.    MEDICATIONS:                                                                                                                                                              Zofran/Ondansetron 8mg  tablet. Take 1 tablet every 8 hours as needed for nausea/vomiting. (#1 nausea med to take, this can constipate)  Compazine/Prochlorperazine 10mg  tablet. Take 1 tablet every 6 hours as needed for nausea/vomiting. (#2 nausea med to take, this can make you sleepy)  Over-the-Counter Meds:  Miralax 1 capful in 8 oz of fluid daily. May increase to two times a day if needed. This is a stool softener. If this doesn't work proceed you can add:  Senokot S-start with 1 tablet two times a day and increase to 4 tablets two times a day if needed. (total of 8 tablets in a 24 hour period). This is a stimulant laxative.   Call us if this does not help your bowels move.   Imodium 2mg  capsule. Take 2 capsules after the 1st loose stool and then 1 capsule every 2 hours until you go a total of 12 hours without having a loose stool. Call the [facility] if loose stools continue. If diarrhea occurs @ bedtime, take 2 capsules @ bedtime. Then take2 capsules every 4 hours until morning. Call [facility]. Constipation Sheet *Miralax in 8 oz of fluid daily.  May increase to two times a day if needed.  This is a stool softener.  If this not enough to keep your bowel regular:  You can add:  *Senokot S, start with one tablet twice a day and can increase to 4 tablets twice a day if needed.  This is a stimulant laxative.   Sometimes when you take pain medication you need BOTH  a medicine to keep your stool soft and a medicine to help your bowel push it out!  Please call if the above does not work for you.   Do not go more than 2 days without a bowel movement.  It is very important that you do not become constipated.  It will make you feel sick to your stomach (nausea) and can cause abdominal pain and  vomiting.    Diarrhea Sheet  If you are having loose stools/diarrhea, please purchase Imodium and begin taking as outlined:  At the first sign of poorly formed or loose stools you should begin taking Imodium(loperamide) 2 mg capsules.  Take two caplets (4mg ) followed by one caplet (2mg ) every 2 hours until you have had no diarrhea for 12 hours.  During the night take two caplets (4mg ) at bedtime and continue every 4 hours during the night until the morning.  Stop taking Imodium only after there is no sign of diarrhea for 12 hours.    Always call the [facility] if you are having loose stools/diarrhea that you can't get under control.  Loose stools/disrrhea leads to dehydration (loss of water) in your body.  We have other options of trying to get the loose stools/diarrhea to stopped but you must let us know!  Nausea Sheet  Zofran/Ondansetron 8mg  tablet. Take 1 tablet every 8 hours as needed for nausea/vomiting. (#1 nausea med to take, this can constipate)  Compazine/Prochlorperazine 10mg  tablet. Take 1 tablet every 6 hours as needed for nausea/vomiting. (#2 nausea med to take, this can make you sleepy)  You can take these medications together or separately.  We would first like for you to try the Ondansetron by itself and then take the Prochloperizine if needed. But you are allowed to take both medications at the same time if your nausea is that severe.  If you are having persistent nausea (nausea that does not stop) please take these medications on a staggered schedule so that the nausea medication stays in your body.  Please call the [facility] and let us know the amount of nausea that you are experiencing.  If you begin to vomit, you need to call the [facility] and if it is the weekend and you have vomited more than one time and cant get it to stop-go to the Emergency Room.  Persistent nausea/vomiting can lead to dehydration (loss of fluid in your body) and will make you feel terrible.    Ice chips, sips of clear liquids, foods that are @ room temperature, crackers, and toast tend to be better tolerated.    SYMPTOMS TO REPORT AS SOON AS POSSIBLE AFTER TREATMENT:  FEVER GREATER THAN 100.5 F  CHILLS WITH OR WITHOUT FEVER  NAUSEA AND VOMITING THAT IS NOT CONTROLLED WITH YOUR NAUSEA MEDICATION  UNUSUAL SHORTNESS OF BREATH  UNUSUAL BRUISING OR BLEEDING  TENDERNESS IN MOUTH AND THROAT WITH OR WITHOUT PRESENCE OF ULCERS  URINARY PROBLEMS  BOWEL PROBLEMS  UNUSUAL RASH    Wear comfortable clothing and clothing appropriate for easy access to any Portacath or PICC line. Let us know if there is anything that we can do to make your therapy better!    What to do if you need assistance after hours or on the weekends: CALL [phone].  HOLD on the line, do not hang up.  You will hear multiple messages but at the end you will be connected with a nurse triage line.  They will contact the doctor if necessary.  Most of  the time they will be able to assist you.  Do not call the hospital operator.     I have been informed and understand all of the instructions given to me and have received a copy. I have been instructed to call the clinic [phone] or my family physician as soon as possible for continued medical care, if indicated. I do not have any more questions at this time but understand that I may call the [facility] or the Patient Navigator at [phone] during office hours should I have questions or need assistance in obtaining follow-up care.

## 2017-11-24 ENCOUNTER — Ambulatory Visit (HOSPITAL_COMMUNITY)
Admission: RE | Admit: 2017-11-24 | Discharge: 2017-11-24 | Disposition: A | Discharge status: Home / Self Care | Payer: Medicare HMO | Source: Ambulatory Visit | Attending: Hematology | Admitting: Hematology

## 2017-11-24 DIAGNOSIS — C3490 Malignant neoplasm of unspecified part of unspecified bronchus or lung: Secondary | ICD-10-CM | POA: Diagnosis not present

## 2017-11-24 MED ORDER — GADOBENATE DIMEGLUMINE 529 MG/ML IV SOLN
15.0000 mL | Freq: Once | INTRAVENOUS | Status: AC | PRN
  Administered 2017-11-24: 15 mL via INTRAVENOUS

## 2017-11-27 ENCOUNTER — Inpatient Hospital Stay (HOSPITAL_COMMUNITY): Payer: Medicare HMO

## 2017-11-27 ENCOUNTER — Encounter (HOSPITAL_COMMUNITY): Payer: Self-pay | Admitting: Emergency Medicine

## 2017-11-27 ENCOUNTER — Encounter (HOSPITAL_COMMUNITY): Payer: Self-pay

## 2017-11-27 ENCOUNTER — Ambulatory Visit (HOSPITAL_COMMUNITY)
Admission: RE | Admit: 2017-11-27 | Discharge: 2017-11-27 | Disposition: A | Discharge status: Home / Self Care | Payer: Medicare HMO | Source: Ambulatory Visit | Attending: Internal Medicine | Admitting: Internal Medicine

## 2017-11-27 ENCOUNTER — Emergency Department (HOSPITAL_COMMUNITY)
Admission: EM | Admit: 2017-11-27 | Discharge: 2017-11-27 | Disposition: A | Discharge status: Home / Self Care | Payer: Medicare HMO | Attending: Emergency Medicine | Admitting: Emergency Medicine

## 2017-11-27 VITALS — BP 180/77 | HR 86 | Temp 97.7°F | Resp 18 | Wt 175.3 lb

## 2017-11-27 DIAGNOSIS — K5900 Constipation, unspecified: Secondary | ICD-10-CM | POA: Insufficient documentation

## 2017-11-27 DIAGNOSIS — E119 Type 2 diabetes mellitus without complications: Secondary | ICD-10-CM | POA: Diagnosis not present

## 2017-11-27 DIAGNOSIS — C3490 Malignant neoplasm of unspecified part of unspecified bronchus or lung: Secondary | ICD-10-CM

## 2017-11-27 DIAGNOSIS — I10 Essential (primary) hypertension: Secondary | ICD-10-CM | POA: Diagnosis not present

## 2017-11-27 DIAGNOSIS — J45909 Unspecified asthma, uncomplicated: Secondary | ICD-10-CM | POA: Diagnosis not present

## 2017-11-27 DIAGNOSIS — Z9221 Personal history of antineoplastic chemotherapy: Secondary | ICD-10-CM | POA: Insufficient documentation

## 2017-11-27 DIAGNOSIS — Z01818 Encounter for other preprocedural examination: Secondary | ICD-10-CM | POA: Insufficient documentation

## 2017-11-27 DIAGNOSIS — Z5111 Encounter for antineoplastic chemotherapy: Secondary | ICD-10-CM | POA: Diagnosis not present

## 2017-11-27 DIAGNOSIS — Z87891 Personal history of nicotine dependence: Secondary | ICD-10-CM | POA: Insufficient documentation

## 2017-11-27 DIAGNOSIS — Z8673 Personal history of transient ischemic attack (TIA), and cerebral infarction without residual deficits: Secondary | ICD-10-CM | POA: Insufficient documentation

## 2017-11-27 DIAGNOSIS — Z733 Stress, not elsewhere classified: Secondary | ICD-10-CM | POA: Insufficient documentation

## 2017-11-27 DIAGNOSIS — J449 Chronic obstructive pulmonary disease, unspecified: Secondary | ICD-10-CM | POA: Diagnosis not present

## 2017-11-27 DIAGNOSIS — R109 Unspecified abdominal pain: Secondary | ICD-10-CM

## 2017-11-27 DIAGNOSIS — Z85118 Personal history of other malignant neoplasm of bronchus and lung: Secondary | ICD-10-CM | POA: Diagnosis not present

## 2017-11-27 DIAGNOSIS — Z7189 Other specified counseling: Secondary | ICD-10-CM

## 2017-11-27 LAB — BASIC METABOLIC PANEL
Anion gap: 13 (ref 5–15)
BUN: 17 mg/dL (ref 6–20)
CALCIUM: 8.9 mg/dL (ref 8.9–10.3)
CO2: 21 mmol/L — ABNORMAL LOW (ref 22–32)
CREATININE: 0.9 mg/dL (ref 0.61–1.24)
Chloride: 97 mmol/L — ABNORMAL LOW (ref 101–111)
GFR calc non Af Amer: >60 mL/min (ref >60)
Glucose, Bld: 153 mg/dL — ABNORMAL HIGH (ref 65–99)
Potassium: 4.5 mmol/L (ref 3.5–5.1)
SODIUM: 131 mmol/L — AB (ref 135–145)

## 2017-11-27 LAB — CBC WITH DIFFERENTIAL/PLATELET
BASOS ABS: 0.1 10*3/uL (ref 0.0–0.1)
BASOS PCT: 0 %
Basophils Absolute: 0 10*3/uL (ref 0.0–0.1)
Basophils Relative: 1 %
EOS ABS: 0 10*3/uL (ref 0.0–0.7)
EOS PCT: 0 %
Eosinophils Absolute: 0.6 10*3/uL (ref 0.0–0.7)
Eosinophils Relative: 7 %
HCT: 42.1 % (ref 39.0–52.0)
HEMATOCRIT: 44.6 % (ref 39.0–52.0)
HEMOGLOBIN: 14.1 g/dL (ref 13.0–17.0)
Hemoglobin: 14.8 g/dL (ref 13.0–17.0)
LYMPHS ABS: 0.4 10*3/uL — AB (ref 0.7–4.0)
LYMPHS PCT: 22 %
Lymphocytes Relative: 4 %
Lymphs Abs: 1.8 10*3/uL (ref 0.7–4.0)
MCH: 30 pg (ref 26.0–34.0)
MCH: 29.7 pg (ref 26.0–34.0)
MCHC: 33.2 g/dL (ref 30.0–36.0)
MCHC: 33.5 g/dL (ref 30.0–36.0)
MCV: 90.3 fL (ref 78.0–100.0)
MCV: 88.8 fL (ref 78.0–100.0)
MONO ABS: 0.4 10*3/uL (ref 0.1–1.0)
MONOS PCT: 5 %
Monocytes Absolute: 0.1 10*3/uL (ref 0.1–1.0)
Monocytes Relative: 1 %
NEUTROS ABS: 5.1 10*3/uL (ref 1.7–7.7)
NEUTROS PCT: 95 %
Neutro Abs: 9.7 10*3/uL — ABNORMAL HIGH (ref 1.7–7.7)
Neutrophils Relative %: 65 %
PLATELETS: 255 10*3/uL (ref 150–400)
Platelets: 215 10*3/uL (ref 150–400)
RBC: 4.94 MIL/uL (ref 4.22–5.81)
RBC: 4.74 MIL/uL (ref 4.22–5.81)
RDW: 12.6 % (ref 11.5–15.5)
RDW: 12.5 % (ref 11.5–15.5)
WBC: 7.8 10*3/uL (ref 4.0–10.5)
WBC: 10.1 10*3/uL (ref 4.0–10.5)

## 2017-11-27 LAB — COMPREHENSIVE METABOLIC PANEL
ALBUMIN: 3.8 g/dL (ref 3.5–5.0)
ALT: 15 U/L — ABNORMAL LOW (ref 17–63)
ANION GAP: 9 (ref 5–15)
AST: 18 U/L (ref 15–41)
Alkaline Phosphatase: 95 U/L (ref 38–126)
BUN: 18 mg/dL (ref 6–20)
CO2: 24 mmol/L (ref 22–32)
Calcium: 9 mg/dL (ref 8.9–10.3)
Chloride: 101 mmol/L (ref 101–111)
Creatinine, Ser: 1.03 mg/dL (ref 0.61–1.24)
GFR calc Af Amer: >60 mL/min (ref >60)
GFR calc non Af Amer: >60 mL/min (ref >60)
GLUCOSE: 117 mg/dL — AB (ref 65–99)
POTASSIUM: 4.5 mmol/L (ref 3.5–5.1)
SODIUM: 134 mmol/L — AB (ref 135–145)
Total Bilirubin: 0.9 mg/dL (ref 0.3–1.2)
Total Protein: 6.7 g/dL (ref 6.5–8.1)

## 2017-11-27 LAB — TSH: TSH: 0.816 u[IU]/mL (ref 0.350–4.500)

## 2017-11-27 MED ORDER — DEXAMETHASONE SODIUM PHOSPHATE 10 MG/ML IJ SOLN
10.0000 mg | Freq: Once | INTRAMUSCULAR | Status: AC
  Administered 2017-11-27: 10 mg via INTRAVENOUS

## 2017-11-27 MED ORDER — PALONOSETRON HCL INJECTION 0.25 MG/5ML
0.2500 mg | Freq: Once | INTRAVENOUS | Status: AC
  Administered 2017-11-27: 0.25 mg via INTRAVENOUS

## 2017-11-27 MED ORDER — PALONOSETRON HCL INJECTION 0.25 MG/5ML
INTRAVENOUS | Status: AC
  Filled 2017-11-27: qty 5

## 2017-11-27 MED ORDER — DEXAMETHASONE SODIUM PHOSPHATE 10 MG/ML IJ SOLN
INTRAMUSCULAR | Status: AC
  Filled 2017-11-27: qty 1

## 2017-11-27 MED ORDER — SORBITOL 70 % SOLN
960.0000 mL | TOPICAL_OIL | Freq: Once | ORAL | Status: AC
  Administered 2017-11-27: 960 mL via RECTAL
  Filled 2017-11-27: qty 473

## 2017-11-27 MED ORDER — SODIUM CHLORIDE 0.9 % IV SOLN
568.5000 mg | Freq: Once | INTRAVENOUS | Status: AC
  Administered 2017-11-27: 570 mg via INTRAVENOUS
  Filled 2017-11-27: qty 57

## 2017-11-27 MED ORDER — SODIUM CHLORIDE 0.9 % IV SOLN
100.0000 mg/m2 | Freq: Once | INTRAVENOUS | Status: AC
  Administered 2017-11-27: 200 mg via INTRAVENOUS
  Filled 2017-11-27: qty 10

## 2017-11-27 MED ORDER — DOCUSATE SODIUM 100 MG PO CAPS: 100.0000 mg | ORAL_CAPSULE | Freq: Two times a day (BID) | ORAL | 0 refills | Status: DC

## 2017-11-27 MED ORDER — POLYETHYLENE GLYCOL 3350 17 G PO PACK: 17.0000 g | PACK | Freq: Every day | ORAL | 0 refills | Status: DC

## 2017-11-27 MED ORDER — SODIUM CHLORIDE 0.9 % IV SOLN
Freq: Once | INTRAVENOUS | Status: AC
  Administered 2017-11-27: 500 mL via INTRAVENOUS

## 2017-11-27 MED ORDER — SODIUM CHLORIDE 0.9% FLUSH
10.0000 mL | INTRAVENOUS | Status: DC | PRN
  Administered 2017-11-27: 10 mL
  Filled 2017-11-27: qty 10

## 2017-11-27 MED ORDER — SODIUM CHLORIDE 0.9 % IV BOLUS (SEPSIS)
1000.0000 mL | Freq: Once | INTRAVENOUS | Status: AC
  Administered 2017-11-27: 1000 mL via INTRAVENOUS

## 2017-11-27 MED ORDER — SODIUM CHLORIDE 0.9 % IV SOLN
1200.0000 mg | Freq: Once | INTRAVENOUS | Status: AC
  Administered 2017-11-27: 1200 mg via INTRAVENOUS
  Filled 2017-11-27: qty 20

## 2017-11-27 MED ORDER — SODIUM CHLORIDE 0.9 % IV SOLN: 10.0000 mg | Freq: Once | INTRAVENOUS | Status: DC

## 2017-11-27 MED ORDER — POLYETHYLENE GLYCOL 3350 17 GM/SCOOP PO POWD: ORAL | 2 refills | Status: DC

## 2017-11-27 NOTE — ED Triage Notes (Signed)
Pt reports he was at the cancer center today for his treatment and was found to be very constipated.  Pt tried Senokot and Miralax x2.  Was unable to keep Mag citrate down.

## 2017-11-27 NOTE — Patient Instructions (Signed)
[facility] Discharge Instructions for Patients Receiving Chemotherapy  Today you received the following chemotherapy agents tecentriq, carboplatin, and etoposide.    If you develop nausea and vomiting that is not controlled by your nausea medication, call the clinic.   BELOW ARE SYMPTOMS THAT SHOULD BE REPORTED IMMEDIATELY:  *FEVER GREATER THAN 100.5 F  *CHILLS WITH OR WITHOUT FEVER  NAUSEA AND VOMITING THAT IS NOT CONTROLLED WITH YOUR NAUSEA MEDICATION  *UNUSUAL SHORTNESS OF BREATH  *UNUSUAL BRUISING OR BLEEDING  TENDERNESS IN MOUTH AND THROAT WITH OR WITHOUT PRESENCE OF ULCERS  *URINARY PROBLEMS  *BOWEL PROBLEMS  UNUSUAL RASH Items with * indicate a potential emergency and should be followed up as soon as possible.  Feel free to call the clinic should you have any questions or concerns. The clinic phone number is [phone].  Please show the [facility] at check-in to the Emergency Department and triage nurse.

## 2017-11-27 NOTE — Discharge Instructions (Signed)

## 2017-11-27 NOTE — ED Provider Notes (Signed)
Emergency Department Provider Note   I have reviewed the triage vital signs and the nursing notes.   HISTORY  Chief Complaint Constipation   HPI [name] [name] is a 57 y.o. male here with abdominal pain and constipation.  Recently diagnosed with lung cancer status post resection and lymph node retrieval get his first infusion of chemotherapy today.  Also recently completed a course of ciprofloxacin for diverticulitis.  Patient is here for no bowel movement since Thursday.  States he usually has multiple bowel movements a day.  He has had a lot of stress and other things going on recently and thinks is why is not having bowel movements.  Does not use narcotics and states is been eating and drinking normally.  Has tried MiraLAX, Colace for symptoms but they did not help.  Try to take magnesium citrate but could not swallow it. No other associated or modifying symptoms.    Past Medical History:  Diagnosis Date  . Anxiety   . Asthma   . COPD (chronic obstructive pulmonary disease) ([place])   . DDD (degenerative disc disease)   . Diverticulitis   . GERD (gastroesophageal reflux disease)   . Headache   . Hepatitis    in the late 70's   . HTN (hypertension)   . Mediastinal adenopathy   . Seizures ([place])    as of 2019 none for 20 years  . Sleep apnea    does not  use cpap  . Stroke [facility])    TIA - Feb. 2018    Patient Active Problem List   Diagnosis Date Noted  . 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 Goals of care, counseling/discussion 11/23/2017  . Hyperlipidemia 01/03/2017  . Family history of heart disease 01/03/2017  . Blurry vision 10/26/2016  . Slurred speech 10/26/2016  . Cerebrovascular disease 10/26/2016  . Chest pain 05/10/2012  . HTN (hypertension) 05/10/2012  . Chronic pain 05/10/2012  . Diabetes mellitus ([place]) 05/10/2012  . Screening for colon cancer 07/25/2011    Past Surgical History:  Procedure Laterality Date  .  CERVICAL DISC SURGERY     X4  . CHOLECYSTECTOMY    . COLONOSCOPY  08/24/2011   Procedure: COLONOSCOPY;  Surgeon: Daneil Dolin, MD;  [facility];  Service: Endoscopy;  Laterality: N/A;  7:30AM  . ELBOW SURGERY     right  . VIDEO BRONCHOSCOPY WITH ENDOBRONCHIAL ULTRASOUND N/A 10/31/2017   Procedure: VIDEO BRONCHOSCOPY WITH ENDOBRONCHIAL ULTRASOUND;  Surgeon: Ivin Poot, MD;  Location: [facility];  Service: Thoracic;  Laterality: N/A;  . VIDEO BRONCHOSCOPY WITH ENDOBRONCHIAL ULTRASOUND N/A 11/22/2017   Procedure: VIDEO BRONCHOSCOPY WITH ENDOBRONCHIAL ULTRASOUND;  Surgeon: Melrose Nakayama, MD;  Location: [facility];  Service: Thoracic;  Laterality: N/A;    Current Outpatient Rx  . Order #: [id number] Class: Historical Med  . Order #: [id number] Class: Historical Med  . Order #: [id number] Class: Historical Med  . Order #: [id number] Class: Historical Med  . Order #: [id number] Class: Historical Med  . Order #: [id number] Class: Historical Med  . Order #: [id number] Class: Historical Med  . Order #: [id number] Class: Historical Med  . Order #: [id number] Class: Historical Med  . Order #: [id number] Class: Historical Med  . Order #: [id number] Class: Historical Med  . Order #: [id number] Class: Historical Med  . Order #: [id number] Class: Normal  . Order #: [id number] Class: Historical Med  . Order #: [id number] Class: Historical Med  . Order #: [id number] Class: Historical Med  . Order #: [id number] Class:  Normal  . Order #: [id number] Class: Historical Med  . Order #: [id number] Class: Normal  . Order #: [id number] Class: Historical Med  . Order #: [id number] Class: Historical Med  . Order #: [id number] Class: Print  . Order #: [id number] Class: Print    Allergies Patient has no known allergies.  Family History  Problem Relation Age of Onset  . Hypertension Father   . Heart attack Father   . Kidney failure Mother   . Diabetes Mother   . Hypertension Mother   . Colon cancer Neg Hx   . Anesthesia problems Neg  Hx   . Hypotension Neg Hx   . Malignant hyperthermia Neg Hx   . Pseudochol deficiency Neg Hx     Social History Social History   Tobacco Use  . Smoking status: Former Smoker    Packs/day: 1.50    Years: 30.00    Pack years: 45.00    Types: Cigarettes    Last attempt to quit: 10/13/2017    Years since quitting: 0.1  . Smokeless tobacco: Never Used  Substance Use Topics  . Alcohol use: No  . Drug use: No    Review of Systems  All other systems negative except as documented in the HPI. All pertinent positives and negatives as reviewed in the HPI. ____________________________________________   PHYSICAL EXAM:  VITAL SIGNS: ED Triage Vitals [11/27/17 1850]  Enc Vitals Group     BP (!) 198/74     Pulse Rate 97     Resp 18     Temp 98 F (36.7 C)     Temp Source Oral     SpO2 100 %     Weight 175 lb (79.4 kg)     Height 5\' 8"  (1.727 m)    Constitutional: Alert and oriented. Well appearing and in no acute distress. Eyes: Conjunctivae are normal. PERRL. EOMI. Head: Atraumatic. Nose: No congestion/rhinnorhea. Mouth/Throat: Mucous membranes are moist.  Oropharynx non-erythematous. Neck: No stridor.  No meningeal signs.   Cardiovascular: Normal rate, regular rhythm. Good peripheral circulation. Grossly normal heart sounds.   Respiratory: Normal respiratory effort.  No retractions. Lungs CTAB. Gastrointestinal: Soft and nontender. No distention.  Musculoskeletal: No lower extremity tenderness nor edema. No gross deformities of extremities. Neurologic:  Normal speech and language. No gross focal neurologic deficits are appreciated.  Skin:  Skin is warm, dry and intact. No rash noted.   ____________________________________________   LABS (all labs ordered are listed, but only abnormal results are displayed)  Labs Reviewed  BASIC METABOLIC PANEL - Abnormal; Notable for the following components:      Result Value   Sodium 131 (*)    Chloride 97 (*)    CO2 21 (*)     Glucose, Bld 153 (*)    All other components within normal limits  CBC WITH DIFFERENTIAL/PLATELET - Abnormal; Notable for the following components:   Neutro Abs 9.7 (*)    Lymphs Abs 0.4 (*)    All other components within normal limits   ____________________________________________   RADIOLOGY  Dg Abd 1 View  Result Date: 11/27/2017 CLINICAL DATA:  Severe abdominal pain and constipation. EXAM: ABDOMEN - 1 VIEW COMPARISON:  None. FINDINGS: Bowel gas pattern is nonobstructive with mild fecal retention throughout the colon and moderate to severe fecal retention over the rectum. Surgical clips over the right upper quadrant. Moderate degenerate change of the spine. IMPRESSION: Nonobstructive bowel gas pattern with moderate fecal retention throughout the colon and moderate to severe fecal retention over the rectum. Electronically  Signed   By: Marin Olp M.D.   On: 11/27/2017 09:35    ____________________________________________   PROCEDURES  Procedure(s) performed:   Fecal disimpaction Date/Time: 11/27/2017 7:34 PM Performed by: Merrily Pew, MD Authorized by: Merrily Pew, MD  Consent: Verbal consent obtained. Risks and benefits: risks, benefits and alternatives were discussed Patient understanding: patient states understanding of the procedure being performed Patient consent: the patient's understanding of the procedure matches consent given Procedure consent: procedure consent matches procedure scheduled Patient identity confirmed: verbally with patient Preparation: Patient was prepped and draped in the usual sterile fashion. Local anesthesia used: no  Anesthesia: Local anesthesia used: no  Sedation: Patient sedated: no  Patient tolerance: Patient tolerated the procedure well with no immediate complications      ____________________________________________   INITIAL IMPRESSION / ASSESSMENT AND PLAN / ED COURSE  Disimpacted. Will try enema to get bowel movement.  Will reevaluate afterwards .  Large BM, imrpoved symptoms. Stable for dc.   Pertinent labs & imaging results that were available during my care of the patient were reviewed by me and considered in my medical decision making (see chart for details).  ____________________________________________  FINAL CLINICAL IMPRESSION(S) / ED DIAGNOSES  Final diagnoses:  Constipation, unspecified constipation type     MEDICATIONS GIVEN DURING THIS VISIT:  Medications  sorbitol, milk of mag, mineral oil, glycerin (SMOG) enema (960 mLs Rectal Given 11/27/17 2049)  sodium chloride 0.9 % bolus 1,000 mL (0 mLs Intravenous Stopped 11/27/17 2213)     NEW OUTPATIENT MEDICATIONS STARTED DURING THIS VISIT:  Discharge Medication List as of 11/27/2017 10:00 PM    START taking these medications   Details  docusate sodium (COLACE) 100 MG capsule Take 1 capsule (100 mg total) by mouth every 12 (twelve) hours., Starting Mon 11/27/2017, Print    polyethylene glycol (MIRALAX / GLYCOLAX) packet Take 17 g by mouth daily., Starting Mon 11/27/2017, Print        Note:  This note was prepared with assistance of Dragon voice recognition software. Occasional wrong-word or sound-a-like substitutions may have occurred due to the inherent limitations of voice recognition software.   Merrily Pew, MD 11/27/17 [phone]

## 2017-11-27 NOTE — Progress Notes (Signed)
0835-patient to treatment area for New chemotherapy treatment.  Patient stated his last bowel movement was Friday before he left to travel to [place] to visit family.  He started a stool softener Sunday evening with no results.  Patient stated he is in a lot of pain in his abdomen and trying to have a bowel movement.  History of diverticulosis per patient.  Abdomen very tender to touch but soft.  No abdominal distention noted and none noted by the patient.  Denied fevers and chills. Denied nausea and vomiting. Reviewed symptoms with Dr. Higgs with verbal orders for a KUB.    1000-KUB results and labs reviewed with Dr. Higgs and ok to treat today.     11 55-Patient drinking Miralax with a hot cup of coffee and taking senokot for constipation.  Family at side and eating lunch.    1507-reviewed patients inability to have a bowel movement during treatment and passing liquid stool.  Verbal orders for the patient to buy MagCitrate and drink lots of water and drink prune juice before any enemas are used Dr. Walden Field.  Patient verbalized understanding.    1528-patient tolerated chemotherapy with no complaints voiced.  Peripheral IV site clean and dry with no bruising or swelling noted at site.  No complaints of pain at site.  Flushed easily.  Dressing reinforced with stockinette for covering.  Reinforced mag citrate with fluids for constipation and when to report to the emergency room with understanding verbalized.  Left ambulatory with family.

## 2017-11-27 NOTE — Progress Notes (Signed)
Chemotherapy teaching completed.  Consent signed.  Extensive teaching packet given.    Miralax sent to the pharmacy for constipation.

## 2017-11-27 NOTE — ED Notes (Signed)
Pt had large amount of stool burden in bed. MD Mesner notified. Pt states she feels relief and is able to sit on bottom with no pain.

## 2017-11-28 ENCOUNTER — Inpatient Hospital Stay (HOSPITAL_COMMUNITY): Payer: Medicare HMO

## 2017-11-28 ENCOUNTER — Encounter (HOSPITAL_COMMUNITY): Payer: Self-pay

## 2017-11-28 VITALS — BP 156/74 | HR 92 | Temp 98.3°F | Resp 18

## 2017-11-28 DIAGNOSIS — Z7189 Other specified counseling: Secondary | ICD-10-CM

## 2017-11-28 DIAGNOSIS — C3490 Malignant neoplasm of unspecified part of unspecified bronchus or lung: Secondary | ICD-10-CM

## 2017-11-28 DIAGNOSIS — Z5111 Encounter for antineoplastic chemotherapy: Secondary | ICD-10-CM | POA: Diagnosis not present

## 2017-11-28 MED ORDER — SODIUM CHLORIDE 0.9 % IV SOLN
Freq: Once | INTRAVENOUS | Status: AC
  Administered 2017-11-28: 10:00:00 via INTRAVENOUS

## 2017-11-28 MED ORDER — SODIUM CHLORIDE 0.9 % IV SOLN: 10.0000 mg | Freq: Once | INTRAVENOUS | Status: DC

## 2017-11-28 MED ORDER — SODIUM CHLORIDE 0.9% FLUSH
3.0000 mL | INTRAVENOUS | Status: DC | PRN
  Administered 2017-11-28: 3 mL
  Filled 2017-11-28: qty 10

## 2017-11-28 MED ORDER — DEXAMETHASONE SODIUM PHOSPHATE 10 MG/ML IJ SOLN
10.0000 mg | Freq: Once | INTRAMUSCULAR | Status: AC
  Administered 2017-11-28: 10 mg via INTRAVENOUS

## 2017-11-28 MED ORDER — DEXAMETHASONE SODIUM PHOSPHATE 10 MG/ML IJ SOLN
INTRAMUSCULAR | Status: AC
  Filled 2017-11-28: qty 1

## 2017-11-28 MED ORDER — SODIUM CHLORIDE 0.9 % IV SOLN
100.0000 mg/m2 | Freq: Once | INTRAVENOUS | Status: AC
  Administered 2017-11-28: 200 mg via INTRAVENOUS
  Filled 2017-11-28: qty 10

## 2017-11-28 NOTE — Patient Instructions (Signed)
[facility] Discharge Instructions for Patients Receiving Chemotherapy   Beginning January 23rd 2017 lab work for the [facility] will be done in the  Main lab at [facility] on 1st floor. If you have a lab appointment with the [facility] please come in thru the  Main Entrance and check in at the main information desk   Today you received the following chemotherapy agents VP-16. Follow-up as scheduled. Call clinic for any questions or concerns  To help prevent nausea and vomiting after your treatment, we encourage you to take your nausea medication   If you develop nausea and vomiting, or diarrhea that is not controlled by your medication, call the clinic.  The clinic phone number is [phone]. Office hours are Monday-Friday 8:30am-5:00pm.  BELOW ARE SYMPTOMS THAT SHOULD BE REPORTED IMMEDIATELY:  *FEVER GREATER THAN 101.0 F  *CHILLS WITH OR WITHOUT FEVER  NAUSEA AND VOMITING THAT IS NOT CONTROLLED WITH YOUR NAUSEA MEDICATION  *UNUSUAL SHORTNESS OF BREATH  *UNUSUAL BRUISING OR BLEEDING  TENDERNESS IN MOUTH AND THROAT WITH OR WITHOUT PRESENCE OF ULCERS  *URINARY PROBLEMS  *BOWEL PROBLEMS  UNUSUAL RASH Items with * indicate a potential emergency and should be followed up as soon as possible. If you have an emergency after office hours please contact your primary care physician or go to the nearest emergency department.  Please call the clinic during office hours if you have any questions or concerns.   You may also contact the Patient Navigator at [phone] should you have any questions or need assistance in obtaining follow up care.      Resources For Cancer Patients and their Caregivers ? American Cancer Society: Can assist with transportation, wigs, general needs, runs Look Good Feel Better.        954-329-1305 ? Cancer Care: Provides financial assistance, online support groups, medication/co-pay assistance.  1-800-813-HOPE  6301042618) ? [facility] [facility] Assists [place] cancer patients and their families through emotional , educational and financial support.  (859)737-5834 ? [place] DSS Where to apply for food stamps, Medicaid and utility assistance. 206-484-3912 ? [employer]: Transportation to medical appointments. (404)452-6685 ? Social Security Administration: May apply for disability if have a Stage IV cancer. (954)168-0066 980-663-2234 ? [employer], Disability and Transit Services: Assists with nutrition, care and transit needs. 434-798-4547

## 2017-11-28 NOTE — Progress Notes (Signed)
[name] [name] tolerated VP-16 infusion well without complaints or incident. Pt feeling much better today since being disimpacted in ER last night. Pt plans on taking Miralax now as needed. IV site is within normal limits and saline lock intact for use tomorrow. VSS upon discharge. Pt discharged self ambulatory using a cane in satisfactory condition accompanied by his wife

## 2017-11-29 ENCOUNTER — Inpatient Hospital Stay (HOSPITAL_COMMUNITY): Payer: Medicare HMO

## 2017-11-29 ENCOUNTER — Inpatient Hospital Stay (HOSPITAL_COMMUNITY): Payer: Medicare HMO | Admitting: Hematology

## 2017-11-29 ENCOUNTER — Encounter (HOSPITAL_COMMUNITY): Payer: Self-pay | Admitting: Hematology

## 2017-11-29 ENCOUNTER — Encounter (HOSPITAL_COMMUNITY): Payer: Self-pay

## 2017-11-29 ENCOUNTER — Other Ambulatory Visit: Payer: Self-pay

## 2017-11-29 VITALS — BP 141/71 | HR 64 | Temp 98.3°F | Resp 18 | Wt 171.7 lb

## 2017-11-29 DIAGNOSIS — Z7189 Other specified counseling: Secondary | ICD-10-CM

## 2017-11-29 DIAGNOSIS — Z5111 Encounter for antineoplastic chemotherapy: Secondary | ICD-10-CM | POA: Diagnosis not present

## 2017-11-29 DIAGNOSIS — C3490 Malignant neoplasm of unspecified part of unspecified bronchus or lung: Secondary | ICD-10-CM

## 2017-11-29 MED ORDER — DEXAMETHASONE SODIUM PHOSPHATE 10 MG/ML IJ SOLN
INTRAMUSCULAR | Status: AC
  Filled 2017-11-29: qty 1

## 2017-11-29 MED ORDER — SODIUM CHLORIDE 0.9% FLUSH
10.0000 mL | INTRAVENOUS | Status: DC | PRN
  Administered 2017-11-29: 10 mL
  Filled 2017-11-29: qty 10

## 2017-11-29 MED ORDER — SODIUM CHLORIDE 0.9 % IV SOLN
Freq: Once | INTRAVENOUS | Status: AC
  Administered 2017-11-29: 09:00:00 via INTRAVENOUS

## 2017-11-29 MED ORDER — SODIUM CHLORIDE 0.9 % IV SOLN
100.0000 mg/m2 | Freq: Once | INTRAVENOUS | Status: AC
  Administered 2017-11-29: 200 mg via INTRAVENOUS
  Filled 2017-11-29: qty 10

## 2017-11-29 MED ORDER — DEXAMETHASONE SODIUM PHOSPHATE 10 MG/ML IJ SOLN
10.0000 mg | Freq: Once | INTRAMUSCULAR | Status: AC
  Administered 2017-11-29: 10 mg via INTRAVENOUS

## 2017-11-29 MED ORDER — SODIUM CHLORIDE 0.9 % IV SOLN: 10.0000 mg | Freq: Once | INTRAVENOUS | Status: DC

## 2017-11-29 NOTE — Progress Notes (Signed)
[name] [name] Kitchen tolerated VP-16 infusion well without complaints or incident. VSS upon discharge. Pt discharged self ambulatory using cane in satisfactory condition accompanied by his wife

## 2017-11-29 NOTE — Patient Instructions (Signed)
[facility] Discharge Instructions for Patients Receiving Chemotherapy   Beginning January 23rd 2017 lab work for the [facility] will be done in the  Main lab at [facility] on 1st floor. If you have a lab appointment with the [facility] please come in thru the  Main Entrance and check in at the main information desk   Today you received the following chemotherapy agents VP-16. Follow-up as scheduled. Call clinic for any questions or concerns  To help prevent nausea and vomiting after your treatment, we encourage you to take your nausea medication   If you develop nausea and vomiting, or diarrhea that is not controlled by your medication, call the clinic.  The clinic phone number is [phone]. Office hours are Monday-Friday 8:30am-5:00pm.  BELOW ARE SYMPTOMS THAT SHOULD BE REPORTED IMMEDIATELY:  *FEVER GREATER THAN 101.0 F  *CHILLS WITH OR WITHOUT FEVER  NAUSEA AND VOMITING THAT IS NOT CONTROLLED WITH YOUR NAUSEA MEDICATION  *UNUSUAL SHORTNESS OF BREATH  *UNUSUAL BRUISING OR BLEEDING  TENDERNESS IN MOUTH AND THROAT WITH OR WITHOUT PRESENCE OF ULCERS  *URINARY PROBLEMS  *BOWEL PROBLEMS  UNUSUAL RASH Items with * indicate a potential emergency and should be followed up as soon as possible. If you have an emergency after office hours please contact your primary care physician or go to the nearest emergency department.  Please call the clinic during office hours if you have any questions or concerns.   You may also contact the Patient Navigator at [phone] should you have any questions or need assistance in obtaining follow up care.      Resources For Cancer Patients and their Caregivers ? American Cancer Society: Can assist with transportation, wigs, general needs, runs Look Good Feel Better.        954-329-1305 ? Cancer Care: Provides financial assistance, online support groups, medication/co-pay assistance.  1-800-813-HOPE  6301042618) ? [facility] [facility] Assists [place] cancer patients and their families through emotional , educational and financial support.  (859)737-5834 ? [place] DSS Where to apply for food stamps, Medicaid and utility assistance. 206-484-3912 ? [employer]: Transportation to medical appointments. (404)452-6685 ? Social Security Administration: May apply for disability if have a Stage IV cancer. (954)168-0066 980-663-2234 ? [employer], Disability and Transit Services: Assists with nutrition, care and transit needs. 434-798-4547

## 2017-11-29 NOTE — Patient Instructions (Signed)
[facility] at [facility] Discharge Instructions  Today you saw Dr. K.   Thank you for choosing [facility] at [facility] to provide your oncology and hematology care.  To afford each patient quality time with our provider, please arrive at least 15 minutes before your scheduled appointment time.   If you have a lab appointment with the Cancer Center please come in thru the  Main Entrance and check in at the main information desk  You need to re-schedule your appointment should you arrive 10 or more minutes late.  We strive to give you quality time with our providers, and arriving late affects you and other patients whose appointments are after yours.  Also, if you no show three or more times for appointments you may be dismissed from the clinic at the providers discretion.     Again, thank you for choosing [facility].  Our hope is that these requests will decrease the amount of time that you wait before being seen by our physicians.       _____________________________________________________________  Should you have questions after your visit to [facility], please contact our office at [phone] between the hours of 8:30 a.m. and 4:30 p.m.  Voicemails left after 4:30 p.m. will not be returned until the following business day.  For prescription refill requests, have your pharmacy contact our office.       Resources For Cancer Patients and their Caregivers ? American Cancer Society: Can assist with transportation, wigs, general needs, runs Look Good Feel Better.        1-888-227-6333 ? Cancer Care: Provides financial assistance, online support groups, medication/co-pay assistance.  1-800-813-HOPE (4673) ? [facility] Assists [place] cancer patients and their families through emotional , educational and financial support.  336-427-4357 ? [place] DSS Where to apply for food  stamps, Medicaid and utility assistance. 336-342-1394 ? [employer]: Transportation to medical appointments. 336-347-2287 ? Social Security Administration: May apply for disability if have a Stage IV cancer. 336-342-7796 1-800-772-1213 ? [place] [employer], Disability and Transit Services: Assists with nutrition, care and transit needs. 336-349-2343  Cancer Center Support Programs:   > Cancer Support Group  2nd Tuesday of the month 1pm-2pm, Journey Room   > Creative Journey  3rd Tuesday of the month 1130am-1pm, Journey Room    

## 2017-11-30 ENCOUNTER — Ambulatory Visit: Payer: Medicare HMO | Admitting: General Surgery

## 2017-11-30 ENCOUNTER — Encounter (INDEPENDENT_AMBULATORY_CARE_PROVIDER_SITE_OTHER): Payer: Self-pay

## 2017-11-30 ENCOUNTER — Encounter (HOSPITAL_COMMUNITY): Payer: Self-pay

## 2017-11-30 ENCOUNTER — Encounter (HOSPITAL_COMMUNITY)
Admission: RE | Admit: 2017-11-30 | Discharge: 2017-11-30 | Disposition: A | Discharge status: Home / Self Care | Payer: Medicare HMO | Source: Ambulatory Visit | Attending: General Surgery | Admitting: General Surgery

## 2017-11-30 ENCOUNTER — Telehealth (HOSPITAL_COMMUNITY): Payer: Self-pay

## 2017-11-30 ENCOUNTER — Encounter: Payer: Self-pay | Admitting: General Surgery

## 2017-11-30 VITALS — BP 128/69 | HR 68 | Temp 98.4°F | Ht 67.0 in | Wt 172.0 lb

## 2017-11-30 DIAGNOSIS — C3490 Malignant neoplasm of unspecified part of unspecified bronchus or lung: Secondary | ICD-10-CM

## 2017-11-30 NOTE — Patient Instructions (Signed)
Implanted Port Insertion Implanted port insertion is a procedure to put in a port and catheter. The port is a device with an injectable disk that can be accessed by your health care provider. The port is connected to a vein in the chest or neck by a small flexible tube (catheter). There are different types of ports. The implanted port may be used as a long-term IV access for:  Medicines, such as chemotherapy.  Fluids.  Liquid nutrition, such as total parenteral nutrition (TPN).  Blood samples.  Having a port means that your health care provider will not need to use the veins in your arms for these procedures. Tell a health care provider about:  Any allergies you have.  All medicines you are taking, especially blood thinners, as well as any vitamins, herbs, eye drops, creams, over-the-counter medicines, and steroids.  Any problems you or family members have had with anesthetic medicines.  Any blood disorders you have.  Any surgeries you have had.  Any medical conditions you have, including diabetes or kidney problems.  Whether you are pregnant or may be pregnant. What are the risks? Generally, this is a safe procedure. However, problems may occur, including:  Allergic reactions to medicines or dyes.  Damage to other structures or organs.  Infection.  Damage to the blood vessel, bruising, or bleeding at the puncture site.  Blood clot.  Breakdown of the skin over the port.  A collection of air in the chest that can cause one of the lungs to collapse (pneumothorax). This is rare.  What happens before the procedure? Staying hydrated Follow instructions from your health care provider about hydration, which may include:  Up to 2 hours before the procedure - you may continue to drink clear liquids, such as water, clear fruit juice, black coffee, and plain tea.  Eating and drinking restrictions  Follow instructions from your health care provider about eating and drinking,  which may include: ? 8 hours before the procedure - stop eating heavy meals or foods such as meat, fried foods, or fatty foods. ? 6 hours before the procedure - stop eating light meals or foods, such as toast or cereal. ? 6 hours before the procedure - stop drinking milk or drinks that contain milk. ? 2 hours before the procedure - stop drinking clear liquids. Medicines  Ask your health care provider about: ? Changing or stopping your regular medicines. This is especially important if you are taking diabetes medicines or blood thinners. ? Taking medicines such as aspirin and ibuprofen. These medicines can thin your blood. Do not take these medicines before your procedure if your health care provider instructs you not to.  You may be given antibiotic medicine to help prevent infection. General instructions  Plan to have someone take you home from the hospital or clinic.  If you will be going home right after the procedure, plan to have someone with you for 24 hours.  You may have blood tests.  You may be asked to shower with a germ-killing soap. What happens during the procedure?  To lower your risk of infection: ? Your health care team will wash or sanitize their hands. ? Your skin will be washed with soap. ? Hair may be removed from the surgical area.  An IV tube will be inserted into one of your veins.  You will be given one or more of the following: ? A medicine to help you relax (sedative). ? A medicine to numb the area (local anesthetic).    Two small cuts (incisions) will be made to insert the port. ? One incision will be made in your neck to get access to the vein where the catheter will lie. ? The other incision will be made in the upper chest. This is where the port will lie.  The procedure may be done using continuous X-ray (fluoroscopy) or other imaging tools for guidance.  The port and catheter will be placed. There may be a small, raised area where the port  is.  The port will be flushed with a salt solution (saline), and blood will be drawn to make sure that it is working correctly.  The incisions will be closed.  Bandages (dressings) may be placed over the incisions. The procedure may vary among health care providers and hospitals. What happens after the procedure?  Your blood pressure, heart rate, breathing rate, and blood oxygen level will be monitored until the medicines you were given have worn off.  Do not drive for 24 hours if you were given a sedative.  You will be given a manufacturer's information card for the type of port that you have. Keep this with you.  Your port will need to be flushed and checked as told by your health care provider, usually every few weeks.  A chest X-ray will be done to: ? Check the placement of the port. ? Make sure there is no injury to your lung. Summary  Implanted port insertion is a procedure to put in a port and catheter.  The implanted port is used as a long-term IV access.  The port will need to be flushed and checked as told by your health care provider, usually every few weeks.  Keep your manufacturer's information card with you at all times. This information is not intended to replace advice given to you by your health care provider. Make sure you discuss any questions you have with your health care provider. Document Released: 06/19/2013 Document Revised: 07/20/2016 Document Reviewed: 07/20/2016 Elsevier Interactive Patient Education  2017 [employer] Implanted Port Home Guide An implanted port is a type of central line that is placed under the skin. Central lines are used to provide IV access when treatment or nutrition needs to be given through a person's veins. Implanted ports are used for long-term IV access. An implanted port may be placed because:  You need IV medicine that would be irritating to the small veins in your hands or arms.  You need long-term IV medicines, such as  antibiotics.  You need IV nutrition for a long period.  You need frequent blood draws for lab tests.  You need dialysis.  Implanted ports are usually placed in the chest area, but they can also be placed in the upper arm, the abdomen, or the leg. An implanted port has two main parts:  Reservoir. The reservoir is round and will appear as a small, raised area under your skin. The reservoir is the part where a needle is inserted to give medicines or draw blood.  Catheter. The catheter is a thin, flexible tube that extends from the reservoir. The catheter is placed into a large vein. Medicine that is inserted into the reservoir goes into the catheter and then into the vein.  How will I care for my incision site? Do not get the incision site wet. Bathe or shower as directed by your health care provider. How is my port accessed? Special steps must be taken to access the port:  Before the port is accessed,   a numbing cream can be placed on the skin. This helps numb the skin over the port site.  Your health care provider uses a sterile technique to access the port. ? Your health care provider must put on a mask and sterile gloves. ? The skin over your port is cleaned carefully with an antiseptic and allowed to dry. ? The port is gently pinched between sterile gloves, and a needle is inserted into the port.  Only "non-coring" port needles should be used to access the port. Once the port is accessed, a blood return should be checked. This helps ensure that the port is in the vein and is not clogged.  If your port needs to remain accessed for a constant infusion, a clear (transparent) bandage will be placed over the needle site. The bandage and needle will need to be changed every week, or as directed by your health care provider.  Keep the bandage covering the needle clean and dry. Do not get it wet. Follow your health care provider's instructions on how to take a shower or bath while the port is  accessed.  If your port does not need to stay accessed, no bandage is needed over the port.  What is flushing? Flushing helps keep the port from getting clogged. Follow your health care provider's instructions on how and when to flush the port. Ports are usually flushed with saline solution or a medicine called heparin. The need for flushing will depend on how the port is used.  If the port is used for intermittent medicines or blood draws, the port will need to be flushed: ? After medicines have been given. ? After blood has been drawn. ? As part of routine maintenance.  If a constant infusion is running, the port may not need to be flushed.  How long will my port stay implanted? The port can stay in for as long as your health care provider thinks it is needed. When it is time for the port to come out, surgery will be done to remove it. The procedure is similar to the one performed when the port was put in. When should I seek immediate medical care? When you have an implanted port, you should seek immediate medical care if:  You notice a bad smell coming from the incision site.  You have swelling, redness, or drainage at the incision site.  You have more swelling or pain at the port site or the surrounding area.  You have a fever that is not controlled with medicine.  This information is not intended to replace advice given to you by your health care provider. Make sure you discuss any questions you have with your health care provider. Document Released: 08/29/2005 Document Revised: 02/04/2016 Document Reviewed: 05/06/2013 Elsevier Interactive Patient Education  2017 Elsevier Inc.  

## 2017-11-30 NOTE — Progress Notes (Signed)
[name] [name]; [id number]; 12-27-1960   HPI Patient is a 57 year old white male who was referred to my care by Dr. Delton Coombes of oncology for Port-A-Cath placement.  Patient has extensive lung cancer, and is undergoing chemotherapy.  He needs central venous access.  He currently has a pain of 4 out of 10. Past Medical History:  Diagnosis Date  . Anxiety   . Asthma   . COPD (chronic obstructive pulmonary disease) ([place])   . DDD (degenerative disc disease)   . Diverticulitis   . GERD (gastroesophageal reflux disease)   . Headache   . Hepatitis    in the late 70's   . HTN (hypertension)   . Mediastinal adenopathy   . Seizures ([place])    as of 2019 none for 20 years  . Sleep apnea    does not  use cpap  . Stroke [facility])    TIA - Feb. 2018    Past Surgical History:  Procedure Laterality Date  . CERVICAL DISC SURGERY     X4  . CHOLECYSTECTOMY    . COLONOSCOPY  08/24/2011   Procedure: COLONOSCOPY;  Surgeon: Daneil Dolin, MD;  [facility];  Service: Endoscopy;  Laterality: N/A;  7:30AM  . ELBOW SURGERY     right  . VIDEO BRONCHOSCOPY WITH ENDOBRONCHIAL ULTRASOUND N/A 10/31/2017   Procedure: VIDEO BRONCHOSCOPY WITH ENDOBRONCHIAL ULTRASOUND;  Surgeon: Ivin Poot, MD;  Location: [place];  Service: Thoracic;  Laterality: N/A;  . VIDEO BRONCHOSCOPY WITH ENDOBRONCHIAL ULTRASOUND N/A 11/22/2017   Procedure: VIDEO BRONCHOSCOPY WITH ENDOBRONCHIAL ULTRASOUND;  Surgeon: Melrose Nakayama, MD;  Location: [facility];  Service: Thoracic;  Laterality: N/A;    Family History  Problem Relation Age of Onset  . Hypertension Father   . Heart attack Father   . Kidney failure Mother   . Diabetes Mother   . Hypertension Mother   . Colon cancer Neg Hx   . Anesthesia problems Neg Hx   . Hypotension Neg Hx   . Malignant hyperthermia Neg Hx   . Pseudochol deficiency Neg Hx     Current Outpatient Medications on File Prior to Visit  Medication Sig Dispense Refill  . albuterol (PROVENTIL  HFA;VENTOLIN HFA) 108 (90 BASE) MCG/ACT inhaler Inhale 2 puffs into the lungs every 6 (six) hours as needed for wheezing or shortness of breath.     Marland Kitchen albuterol (PROVENTIL) (2.5 MG/3ML) 0.083% nebulizer solution Take 2.5 mg by nebulization every 6 (six) hours as needed for wheezing or shortness of breath.     . alprazolam (XANAX) 2 MG tablet Take 2 mg by mouth every 6 (six) hours as needed for sleep or anxiety.     Marland Kitchen amLODipine (NORVASC) 10 MG tablet Take 10 mg by mouth daily.    Huey Bienenstock (TECENTRIQ IV) Inject into the vein. Every 21 days    . CARBOPLATIN IV Inject into the vein. Every 21 days    . cyclobenzaprine (FLEXERIL) 10 MG tablet Take 10 mg by mouth daily as needed for muscle spasms.     Marland Kitchen docusate sodium (COLACE) 100 MG capsule Take 1 capsule (100 mg total) by mouth every 12 (twelve) hours. 60 capsule 0  . ETOPOSIDE IV Inject into the vein. Days 1,2,3 every 21 days    . fluticasone (FLONASE) 50 MCG/ACT nasal spray Place 2 sprays into both nostrils daily as needed for allergies.     . Glucosamine-Chondroit-Vit C-Mn (GLUCOSAMINE 1500 COMPLEX) CAPS Take 1 capsule by mouth.    Marland Kitchen  levETIRAcetam (KEPPRA) 500 MG tablet Take 500 mg by mouth daily.     Marland Kitchen lisinopril (PRINIVIL,ZESTRIL) 20 MG tablet Take 20 mg by mouth 2 (two) times daily.    . meclizine (ANTIVERT) 12.5 MG tablet Take 1 tablet (12.5 mg total) by mouth 3 (three) times daily as needed for dizziness. 30 tablet 0  . MULTIPLE VITAMINS PO Take 1 tablet by mouth.    . naproxen (NAPROSYN) 500 MG tablet Take 500 mg by mouth 2 (two) times daily with a meal.      . Omega-3 Fatty Acids (FISH OIL) 1000 MG CAPS Take 1,000 mg by mouth 2 (two) times daily.     . ondansetron (ZOFRAN) 8 MG tablet Take 1 tablet (8 mg total) by mouth 2 (two) times daily as needed for refractory nausea / vomiting. Start on day 3 after carboplatin chemo. 30 tablet 1  . oxyCODONE (ROXICODONE) 15 MG immediate release tablet Take 15 mg by mouth 4 (four) times daily as  needed for pain.     . polyethylene glycol (MIRALAX / GLYCOLAX) packet Take 17 g by mouth daily. 14 each 0  . prochlorperazine (COMPAZINE) 10 MG tablet Take 1 tablet (10 mg total) by mouth every 6 (six) hours as needed (Nausea or vomiting). 30 tablet 1  . tamsulosin (FLOMAX) 0.4 MG CAPS capsule Take 0.4 mg by mouth daily.     . TURMERIC PO Take 4 tablets by mouth.     No current facility-administered medications on file prior to visit.     No Known Allergies  Social History   Substance and Sexual Activity  Alcohol Use No    Social History   Tobacco Use  Smoking Status Former Smoker  . Packs/day: 1.50  . Years: 30.00  . Pack years: 45.00  . Types: Cigarettes  . Last attempt to quit: 10/13/2017  . Years since quitting: 0.1  Smokeless Tobacco Never Used    Review of Systems  Constitutional: Negative.   HENT: Negative.   Eyes: Negative.   Respiratory: Positive for shortness of breath.   Cardiovascular: Negative.   Gastrointestinal: Negative.   Genitourinary: Negative.   Musculoskeletal: Positive for back pain, joint pain and neck pain.  Skin: Negative.   Neurological: Negative.   Endo/Heme/Allergies: Negative.   Psychiatric/Behavioral: Negative.     Objective   Vitals:   11/30/17 1012  BP: 128/69  Pulse: 68  Temp: 98.4 F (36.9 C)    Physical Exam  Constitutional: He is oriented to person, place, and time and well-developed, well-nourished, and in no distress.  HENT:  Head: Normocephalic and atraumatic.  Cardiovascular: Normal rate, regular rhythm and normal heart sounds. Exam reveals no gallop and no friction rub.  No murmur heard. Pulmonary/Chest: Effort normal and breath sounds normal. No respiratory distress. He has no wheezes. He has no rales.  Neurological: He is alert and oriented to person, place, and time.  Skin: Skin is warm and dry.  Vitals reviewed. Oncology notes reviewed.  Assessment  Lung carcinoma, need for central venous access Plan    Patient is scheduled for Port-A-Cath insertion on 12/01/2016.  The risks and benefits of the procedure including bleeding, infection, and pneumothorax were fully explained to the patient, who gave informed consent.

## 2017-11-30 NOTE — Telephone Encounter (Signed)
24 hour follow up-patient states he is doing well today. No complaints at this time.

## 2017-11-30 NOTE — H&P (Signed)
[name] [name]; [id number]; 05-06-1961   HPI Patient is a 57 year old white male who was referred to my care by Dr. Delton Coombes of oncology for Port-A-Cath placement.  Patient has extensive lung cancer, and is undergoing chemotherapy.  He needs central venous access.  He currently has a pain of 4 out of 10. Past Medical History:  Diagnosis Date  . Anxiety   . Asthma   . COPD (chronic obstructive pulmonary disease) ([place])   . DDD (degenerative disc disease)   . Diverticulitis   . GERD (gastroesophageal reflux disease)   . Headache   . Hepatitis    in the late 70's   . HTN (hypertension)   . Mediastinal adenopathy   . Seizures ([place])    as of 2019 none for 20 years  . Sleep apnea    does not  use cpap  . Stroke [facility])    TIA - Feb. 2018    Past Surgical History:  Procedure Laterality Date  . CERVICAL DISC SURGERY     X4  . CHOLECYSTECTOMY    . COLONOSCOPY  08/24/2011   Procedure: COLONOSCOPY;  Surgeon: Daneil Dolin, MD;  [facility];  Service: Endoscopy;  Laterality: N/A;  7:30AM  . ELBOW SURGERY     right  . VIDEO BRONCHOSCOPY WITH ENDOBRONCHIAL ULTRASOUND N/A 10/31/2017   Procedure: VIDEO BRONCHOSCOPY WITH ENDOBRONCHIAL ULTRASOUND;  Surgeon: Ivin Poot, MD;  Location: [place];  Service: Thoracic;  Laterality: N/A;  . VIDEO BRONCHOSCOPY WITH ENDOBRONCHIAL ULTRASOUND N/A 11/22/2017   Procedure: VIDEO BRONCHOSCOPY WITH ENDOBRONCHIAL ULTRASOUND;  Surgeon: Melrose Nakayama, MD;  [facility];  Service: Thoracic;  Laterality: N/A;    Family History  Problem Relation Age of Onset  . Hypertension Father   . Heart attack Father   . Kidney failure Mother   . Diabetes Mother   . Hypertension Mother   . Colon cancer Neg Hx   . Anesthesia problems Neg Hx   . Hypotension Neg Hx   . Malignant hyperthermia Neg Hx   . Pseudochol deficiency Neg Hx     Current Outpatient Medications on File Prior to Visit  Medication Sig Dispense Refill  . albuterol (PROVENTIL  HFA;VENTOLIN HFA) 108 (90 BASE) MCG/ACT inhaler Inhale 2 puffs into the lungs every 6 (six) hours as needed for wheezing or shortness of breath.     Marland Kitchen albuterol (PROVENTIL) (2.5 MG/3ML) 0.083% nebulizer solution Take 2.5 mg by nebulization every 6 (six) hours as needed for wheezing or shortness of breath.     . alprazolam (XANAX) 2 MG tablet Take 2 mg by mouth every 6 (six) hours as needed for sleep or anxiety.     Marland Kitchen amLODipine (NORVASC) 10 MG tablet Take 10 mg by mouth daily.    Huey Bienenstock (TECENTRIQ IV) Inject into the vein. Every 21 days    . CARBOPLATIN IV Inject into the vein. Every 21 days    . cyclobenzaprine (FLEXERIL) 10 MG tablet Take 10 mg by mouth daily as needed for muscle spasms.     Marland Kitchen docusate sodium (COLACE) 100 MG capsule Take 1 capsule (100 mg total) by mouth every 12 (twelve) hours. 60 capsule 0  . ETOPOSIDE IV Inject into the vein. Days 1,2,3 every 21 days    . fluticasone (FLONASE) 50 MCG/ACT nasal spray Place 2 sprays into both nostrils daily as needed for allergies.     . Glucosamine-Chondroit-Vit C-Mn (GLUCOSAMINE 1500 COMPLEX) CAPS Take 1 capsule by mouth.    Marland Kitchen  levETIRAcetam (KEPPRA) 500 MG tablet Take 500 mg by mouth daily.     Marland Kitchen lisinopril (PRINIVIL,ZESTRIL) 20 MG tablet Take 20 mg by mouth 2 (two) times daily.    . meclizine (ANTIVERT) 12.5 MG tablet Take 1 tablet (12.5 mg total) by mouth 3 (three) times daily as needed for dizziness. 30 tablet 0  . MULTIPLE VITAMINS PO Take 1 tablet by mouth.    . naproxen (NAPROSYN) 500 MG tablet Take 500 mg by mouth 2 (two) times daily with a meal.      . Omega-3 Fatty Acids (FISH OIL) 1000 MG CAPS Take 1,000 mg by mouth 2 (two) times daily.     . ondansetron (ZOFRAN) 8 MG tablet Take 1 tablet (8 mg total) by mouth 2 (two) times daily as needed for refractory nausea / vomiting. Start on day 3 after carboplatin chemo. 30 tablet 1  . oxyCODONE (ROXICODONE) 15 MG immediate release tablet Take 15 mg by mouth 4 (four) times daily as  needed for pain.     . polyethylene glycol (MIRALAX / GLYCOLAX) packet Take 17 g by mouth daily. 14 each 0  . prochlorperazine (COMPAZINE) 10 MG tablet Take 1 tablet (10 mg total) by mouth every 6 (six) hours as needed (Nausea or vomiting). 30 tablet 1  . tamsulosin (FLOMAX) 0.4 MG CAPS capsule Take 0.4 mg by mouth daily.     . TURMERIC PO Take 4 tablets by mouth.     No current facility-administered medications on file prior to visit.     No Known Allergies  Social History   Substance and Sexual Activity  Alcohol Use No    Social History   Tobacco Use  Smoking Status Former Smoker  . Packs/day: 1.50  . Years: 30.00  . Pack years: 45.00  . Types: Cigarettes  . Last attempt to quit: 10/13/2017  . Years since quitting: 0.1  Smokeless Tobacco Never Used    Review of Systems  Constitutional: Negative.   HENT: Negative.   Eyes: Negative.   Respiratory: Positive for shortness of breath.   Cardiovascular: Negative.   Gastrointestinal: Negative.   Genitourinary: Negative.   Musculoskeletal: Positive for back pain, joint pain and neck pain.  Skin: Negative.   Neurological: Negative.   Endo/Heme/Allergies: Negative.   Psychiatric/Behavioral: Negative.     Objective   Vitals:   11/30/17 1012  BP: 128/69  Pulse: 68  Temp: 98.4 F (36.9 C)    Physical Exam  Constitutional: He is oriented to person, place, and time and well-developed, well-nourished, and in no distress.  HENT:  Head: Normocephalic and atraumatic.  Cardiovascular: Normal rate, regular rhythm and normal heart sounds. Exam reveals no gallop and no friction rub.  No murmur heard. Pulmonary/Chest: Effort normal and breath sounds normal. No respiratory distress. He has no wheezes. He has no rales.  Neurological: He is alert and oriented to person, place, and time.  Skin: Skin is warm and dry.  Vitals reviewed. Oncology notes reviewed.  Assessment  Lung carcinoma, need for central venous access Plan    Patient is scheduled for Port-A-Cath insertion on 12/01/2016.  The risks and benefits of the procedure including bleeding, infection, and pneumothorax were fully explained to the patient, who gave informed consent.

## 2017-12-01 ENCOUNTER — Ambulatory Visit (HOSPITAL_COMMUNITY): Payer: Medicare HMO | Admitting: Anesthesiology

## 2017-12-01 ENCOUNTER — Ambulatory Visit (HOSPITAL_COMMUNITY): Payer: Medicare HMO

## 2017-12-01 ENCOUNTER — Encounter (HOSPITAL_COMMUNITY)
Admission: RE | Disposition: A | Discharge status: Home / Self Care | Payer: Self-pay | Source: Ambulatory Visit | Attending: General Surgery

## 2017-12-01 ENCOUNTER — Encounter (HOSPITAL_COMMUNITY): Payer: Self-pay

## 2017-12-01 ENCOUNTER — Ambulatory Visit (HOSPITAL_COMMUNITY)
Admission: RE | Admit: 2017-12-01 | Discharge: 2017-12-01 | Disposition: A | Discharge status: Home / Self Care | Payer: Medicare HMO | Source: Ambulatory Visit | Attending: General Surgery | Admitting: General Surgery

## 2017-12-01 ENCOUNTER — Inpatient Hospital Stay (HOSPITAL_COMMUNITY): Payer: Medicare HMO

## 2017-12-01 VITALS — BP 117/60 | HR 60 | Temp 97.8°F | Resp 16

## 2017-12-01 DIAGNOSIS — J449 Chronic obstructive pulmonary disease, unspecified: Secondary | ICD-10-CM | POA: Diagnosis not present

## 2017-12-01 DIAGNOSIS — Z8673 Personal history of transient ischemic attack (TIA), and cerebral infarction without residual deficits: Secondary | ICD-10-CM | POA: Diagnosis not present

## 2017-12-01 DIAGNOSIS — C3490 Malignant neoplasm of unspecified part of unspecified bronchus or lung: Secondary | ICD-10-CM

## 2017-12-01 DIAGNOSIS — Z5111 Encounter for antineoplastic chemotherapy: Secondary | ICD-10-CM | POA: Diagnosis not present

## 2017-12-01 DIAGNOSIS — Z87891 Personal history of nicotine dependence: Secondary | ICD-10-CM | POA: Insufficient documentation

## 2017-12-01 DIAGNOSIS — R69 Illness, unspecified: Secondary | ICD-10-CM | POA: Diagnosis not present

## 2017-12-01 DIAGNOSIS — F419 Anxiety disorder, unspecified: Secondary | ICD-10-CM | POA: Insufficient documentation

## 2017-12-01 DIAGNOSIS — G4730 Sleep apnea, unspecified: Secondary | ICD-10-CM | POA: Insufficient documentation

## 2017-12-01 DIAGNOSIS — K219 Gastro-esophageal reflux disease without esophagitis: Secondary | ICD-10-CM | POA: Insufficient documentation

## 2017-12-01 DIAGNOSIS — Z79899 Other long term (current) drug therapy: Secondary | ICD-10-CM | POA: Diagnosis not present

## 2017-12-01 DIAGNOSIS — C3491 Malignant neoplasm of unspecified part of right bronchus or lung: Secondary | ICD-10-CM | POA: Diagnosis not present

## 2017-12-01 DIAGNOSIS — I10 Essential (primary) hypertension: Secondary | ICD-10-CM | POA: Insufficient documentation

## 2017-12-01 DIAGNOSIS — Z7189 Other specified counseling: Secondary | ICD-10-CM

## 2017-12-01 DIAGNOSIS — Z95828 Presence of other vascular implants and grafts: Secondary | ICD-10-CM

## 2017-12-01 HISTORY — PX: PORTACATH PLACEMENT: SHX2246

## 2017-12-01 LAB — GLUCOSE, CAPILLARY: GLUCOSE-CAPILLARY: 84 mg/dL (ref 65–99)

## 2017-12-01 SURGERY — INSERTION, TUNNELED CENTRAL VENOUS DEVICE, WITH PORT
Anesthesia: Monitor Anesthesia Care | Site: Chest | Laterality: Right

## 2017-12-01 MED ORDER — KETOROLAC TROMETHAMINE 30 MG/ML IJ SOLN
30.0000 mg | Freq: Once | INTRAMUSCULAR | Status: AC
  Administered 2017-12-01: 30 mg via INTRAVENOUS

## 2017-12-01 MED ORDER — PEGFILGRASTIM-CBQV 6 MG/0.6ML ~~LOC~~ SOSY
6.0000 mg | PREFILLED_SYRINGE | Freq: Once | SUBCUTANEOUS | Status: AC
  Administered 2017-12-01: 6 mg via SUBCUTANEOUS
  Filled 2017-12-01: qty 0.6

## 2017-12-01 MED ORDER — FENTANYL CITRATE (PF) 100 MCG/2ML IJ SOLN
INTRAMUSCULAR | Status: AC
  Filled 2017-12-01: qty 2

## 2017-12-01 MED ORDER — LIDOCAINE HCL (PF) 1 % IJ SOLN
INTRAMUSCULAR | Status: AC
  Filled 2017-12-01: qty 30

## 2017-12-01 MED ORDER — HEPARIN SOD (PORK) LOCK FLUSH 100 UNIT/ML IV SOLN
INTRAVENOUS | Status: DC | PRN
Start: 2017-12-01 — End: 2017-12-01
  Administered 2017-12-01: 500 [IU] via INTRAVENOUS

## 2017-12-01 MED ORDER — FENTANYL CITRATE (PF) 100 MCG/2ML IJ SOLN
25.0000 ug | Freq: Once | INTRAMUSCULAR | Status: AC
  Administered 2017-12-01: 25 ug via INTRAVENOUS

## 2017-12-01 MED ORDER — KETOROLAC TROMETHAMINE 30 MG/ML IJ SOLN
INTRAMUSCULAR | Status: AC
  Filled 2017-12-01: qty 1

## 2017-12-01 MED ORDER — FENTANYL CITRATE (PF) 100 MCG/2ML IJ SOLN
INTRAMUSCULAR | Status: DC | PRN
  Administered 2017-12-01: 100 ug via INTRAVENOUS

## 2017-12-01 MED ORDER — PROPOFOL 500 MG/50ML IV EMUL
INTRAVENOUS | Status: DC | PRN
  Administered 2017-12-01: 100 ug/kg/min via INTRAVENOUS

## 2017-12-01 MED ORDER — SODIUM CHLORIDE 0.9 % IV SOLN
INTRAVENOUS | Status: AC | PRN
  Administered 2017-12-01: 10 mL via INTRAMUSCULAR

## 2017-12-01 MED ORDER — CHLORHEXIDINE GLUCONATE CLOTH 2 % EX PADS: 6.0000 | MEDICATED_PAD | Freq: Once | CUTANEOUS | Status: DC

## 2017-12-01 MED ORDER — CEFAZOLIN SODIUM-DEXTROSE 2-4 GM/100ML-% IV SOLN
2.0000 g | INTRAVENOUS | Status: AC
  Administered 2017-12-01: 2 g via INTRAVENOUS
  Filled 2017-12-01: qty 100

## 2017-12-01 MED ORDER — LIDOCAINE-PRILOCAINE 2.5-2.5 % EX CREA: 1.0000 "application " | TOPICAL_CREAM | CUTANEOUS | 0 refills | Status: DC | PRN

## 2017-12-01 MED ORDER — HEPARIN SOD (PORK) LOCK FLUSH 100 UNIT/ML IV SOLN
INTRAVENOUS | Status: AC
  Filled 2017-12-01: qty 5

## 2017-12-01 MED ORDER — PROPOFOL 10 MG/ML IV BOLUS
INTRAVENOUS | Status: AC
  Filled 2017-12-01: qty 20

## 2017-12-01 MED ORDER — LACTATED RINGERS IV SOLN
INTRAVENOUS | Status: DC
  Administered 2017-12-01 (×2): via INTRAVENOUS

## 2017-12-01 MED ORDER — LIDOCAINE HCL (PF) 1 % IJ SOLN
INTRAMUSCULAR | Status: DC | PRN
  Administered 2017-12-01: 7 mL

## 2017-12-01 MED ORDER — MIDAZOLAM HCL 2 MG/2ML IJ SOLN
1.0000 mg | INTRAMUSCULAR | Status: AC
  Administered 2017-12-01: 2 mg via INTRAVENOUS

## 2017-12-01 MED ORDER — MIDAZOLAM HCL 2 MG/2ML IJ SOLN
INTRAMUSCULAR | Status: AC
  Filled 2017-12-01: qty 2

## 2017-12-01 MED ORDER — SEVOFLURANE IN SOLN
RESPIRATORY_TRACT | Status: AC
  Filled 2017-12-01: qty 250

## 2017-12-01 SURGICAL SUPPLY — 36 items
ADH SKN CLS APL DERMABOND .7 (GAUZE/BANDAGES/DRESSINGS) ×1
APPLIER CLIP 9.375 SM OPEN (CLIP)
APR CLP SM 9.3 20 MLT OPN (CLIP)
BAG DECANTER FOR FLEXI CONT (MISCELLANEOUS) ×2 IMPLANT
BAG HAMPER (MISCELLANEOUS) ×2 IMPLANT
CATH HICKMAN DUAL 12.0 (CATHETERS) IMPLANT
CHLORAPREP W/TINT 10.5 ML (MISCELLANEOUS) ×2 IMPLANT
CLIP APPLIE 9.375 SM OPEN (CLIP) IMPLANT
CLOTH BEACON ORANGE TIMEOUT ST (SAFETY) ×2 IMPLANT
COVER LIGHT HANDLE STERIS (MISCELLANEOUS) ×4 IMPLANT
DECANTER SPIKE VIAL GLASS SM (MISCELLANEOUS) ×2 IMPLANT
DERMABOND ADVANCED (GAUZE/BANDAGES/DRESSINGS) ×1
DERMABOND ADVANCED .7 DNX12 (GAUZE/BANDAGES/DRESSINGS) ×1 IMPLANT
DRAPE C-ARM FOLDED MOBILE STRL (DRAPES) ×2 IMPLANT
ELECT REM PT RETURN 9FT ADLT (ELECTROSURGICAL) ×2
ELECTRODE REM PT RTRN 9FT ADLT (ELECTROSURGICAL) ×1 IMPLANT
GLOVE BIOGEL PI IND STRL 7.0 (GLOVE) ×1 IMPLANT
GLOVE BIOGEL PI INDICATOR 7.0 (GLOVE) ×1
GLOVE SURG SS PI 7.5 STRL IVOR (GLOVE) ×2 IMPLANT
GOWN STRL REUS W/TWL LRG LVL3 (GOWN DISPOSABLE) ×4 IMPLANT
IV NS 500ML (IV SOLUTION) ×2
IV NS 500ML BAXH (IV SOLUTION) ×1 IMPLANT
KIT PORT POWER 8FR ISP MRI (Port) ×2 IMPLANT
KIT TURNOVER KIT A (KITS) ×2 IMPLANT
MANIFOLD NEPTUNE II (INSTRUMENTS) ×2 IMPLANT
NDL HYPO 25X1 1.5 SAFETY (NEEDLE) ×1 IMPLANT
NEEDLE HYPO 25X1 1.5 SAFETY (NEEDLE) ×2 IMPLANT
PACK MINOR (CUSTOM PROCEDURE TRAY) ×2 IMPLANT
PAD ARMBOARD 7.5X6 YLW CONV (MISCELLANEOUS) ×2 IMPLANT
SET BASIN LINEN APH (SET/KITS/TRAYS/PACK) ×2 IMPLANT
SET INTRODUCER 12FR PACEMAKER (INTRODUCER) IMPLANT
SUT MNCRL AB 4-0 PS2 18 (SUTURE) ×2 IMPLANT
SUT VIC AB 3-0 SH 27 (SUTURE) ×2
SUT VIC AB 3-0 SH 27X BRD (SUTURE) ×1 IMPLANT
SYR 20CC LL (SYRINGE) ×2 IMPLANT
SYR CONTROL 10ML LL (SYRINGE) ×2 IMPLANT

## 2017-12-01 NOTE — Discharge Instructions (Signed)
Implanted Port Insertion, Care After °This sheet gives you information about how to care for yourself after your procedure. Your health care provider may also give you more specific instructions. If you have problems or questions, contact your health care provider. °What can I expect after the procedure? °After your procedure, it is common to have: °· Discomfort at the port insertion site. °· Bruising on the skin over the port. This should improve over 3-4 days. ° °Follow these instructions at home: °Port care °· After your port is placed, you will get a manufacturer's information card. The card has information about your port. Keep this card with you at all times. °· Take care of the port as told by your health care provider. Ask your health care provider if you or a family member can get training for taking care of the port at home. A home health care nurse may also take care of the port. °· Make sure to remember what type of port you have. °Incision care °· Follow instructions from your health care provider about how to take care of your port insertion site. Make sure you: °? Wash your hands with soap and water before you change your bandage (dressing). If soap and water are not available, use hand sanitizer. °? Change your dressing as told by your health care provider. °? Leave stitches (sutures), skin glue, or adhesive strips in place. These skin closures may need to stay in place for 2 weeks or longer. If adhesive strip edges start to loosen and curl up, you may trim the loose edges. Do not remove adhesive strips completely unless your health care provider tells you to do that. °· Check your port insertion site every day for signs of infection. Check for: °? More redness, swelling, or pain. °? More fluid or blood. °? Warmth. °? Pus or a bad smell. °General instructions °· Do not take baths, swim, or use a hot tub until your health care provider approves. °· Do not lift anything that is heavier than 10 lb (4.5  kg) for a week, or as told by your health care provider. °· Ask your health care provider when it is okay to: °? Return to work or school. °? Resume usual physical activities or sports. °· Do not drive for 24 hours if you were given a medicine to help you relax (sedative). °· Take over-the-counter and prescription medicines only as told by your health care provider. °· Wear a medical alert bracelet in case of an emergency. This will tell any health care providers that you have a port. °· Keep all follow-up visits as told by your health care provider. This is important. °Contact a health care provider if: °· You cannot flush your port with saline as directed, or you cannot draw blood from the port. °· You have a fever or chills. °· You have more redness, swelling, or pain around your port insertion site. °· You have more fluid or blood coming from your port insertion site. °· Your port insertion site feels warm to the touch. °· You have pus or a bad smell coming from the port insertion site. °Get help right away if: °· You have chest pain or shortness of breath. °· You have bleeding from your port that you cannot control. °Summary °· Take care of the port as told by your health care provider. °· Change your dressing as told by your health care provider. °· Keep all follow-up visits as told by your health care provider. °  This information is not intended to replace advice given to you by your health care provider. Make sure you discuss any questions you have with your health care provider. Document Released: 06/19/2013 Document Revised: 07/20/2016 Document Reviewed: 07/20/2016 Elsevier Interactive Patient Education  2017 [employer] An implanted port is a type of central line that is placed under the skin. Central lines are used to provide IV access when treatment or nutrition needs to be given through a persons veins. Implanted ports are used for long-term IV access. An implanted port  may be placed because:  You need IV medicine that would be irritating to the small veins in your hands or arms.  You need long-term IV medicines, such as antibiotics.  You need IV nutrition for a long period.  You need frequent blood draws for lab tests.  You need dialysis.  Implanted ports are usually placed in the chest area, but they can also be placed in the upper arm, the abdomen, or the leg. An implanted port has two main parts:  Reservoir. The reservoir is round and will appear as a small, raised area under your skin. The reservoir is the part where a needle is inserted to give medicines or draw blood.  Catheter. The catheter is a thin, flexible tube that extends from the reservoir. The catheter is placed into a large vein. Medicine that is inserted into the reservoir goes into the catheter and then into the vein.  How will I care for my incision site? Do not get the incision site wet. Bathe or shower as directed by your health care provider. How is my port accessed? Special steps must be taken to access the port:  Before the port is accessed, a numbing cream can be placed on the skin. This helps numb the skin over the port site.  Your health care provider uses a sterile technique to access the port. ? Your health care provider must put on a mask and sterile gloves. ? The skin over your port is cleaned carefully with an antiseptic and allowed to dry. ? The port is gently pinched between sterile gloves, and a needle is inserted into the port.  Only "non-coring" port needles should be used to access the port. Once the port is accessed, a blood return should be checked. This helps ensure that the port is in the vein and is not clogged.  If your port needs to remain accessed for a constant infusion, a clear (transparent) bandage will be placed over the needle site. The bandage and needle will need to be changed every week, or as directed by your health care provider.  Keep the  bandage covering the needle clean and dry. Do not get it wet. Follow your health care providers instructions on how to take a shower or bath while the port is accessed.  If your port does not need to stay accessed, no bandage is needed over the port.  What is flushing? Flushing helps keep the port from getting clogged. Follow your health care providers instructions on how and when to flush the port. Ports are usually flushed with saline solution or a medicine called heparin. The need for flushing will depend on how the port is used.  If the port is used for intermittent medicines or blood draws, the port will need to be flushed: ? After medicines have been given. ? After blood has been drawn. ? As part of routine maintenance.  If a constant infusion is  running, the port may not need to be flushed.  How long will my port stay implanted? The port can stay in for as long as your health care provider thinks it is needed. When it is time for the port to come out, surgery will be done to remove it. The procedure is similar to the one performed when the port was put in. When should I seek immediate medical care? When you have an implanted port, you should seek immediate medical care if:  You notice a bad smell coming from the incision site.  You have swelling, redness, or drainage at the incision site.  You have more swelling or pain at the port site or the surrounding area.  You have a fever that is not controlled with medicine.  This information is not intended to replace advice given to you by your health care provider. Make sure you discuss any questions you have with your health care provider. Document Released: 08/29/2005 Document Revised: 02/04/2016 Document Reviewed: 05/06/2013 Elsevier Interactive Patient Education  2017 [employer].

## 2017-12-01 NOTE — Progress Notes (Signed)
[name] [name] tolerated Udenycya injection well without complaints or incident. VSS Pt discharged via wheelchair in satisfactory condition accompanied by his wife

## 2017-12-01 NOTE — Patient Instructions (Signed)
[place] at [facility] Discharge Instructions  Received Udenycya injection today. Follow-up as scheduled. Call clinic for any questions or concerns   Thank you for choosing [place] at [facility] to provide your oncology and hematology care.  To afford each patient quality time with our provider, please arrive at least 15 minutes before your scheduled appointment time.   If you have a lab appointment with the [facility] please come in thru the  Main Entrance and check in at the main information desk  You need to re-schedule your appointment should you arrive 10 or more minutes late.  We strive to give you quality time with our providers, and arriving late affects you and other patients whose appointments are after yours.  Also, if you no show three or more times for appointments you may be dismissed from the clinic at the providers discretion.     Again, thank you for choosing [facility].  Our hope is that these requests will decrease the amount of time that you wait before being seen by our physicians.       _____________________________________________________________  Should you have questions after your visit to [facility], please contact our office at [phone] between the hours of 8:30 a.m. and 4:30 p.m.  Voicemails left after 4:30 p.m. will not be returned until the following business day.  For prescription refill requests, have your pharmacy contact our office.       Resources For Cancer Patients and their Caregivers ? American Cancer Society: Can assist with transportation, wigs, general needs, runs Look Good Feel Better.        8086858264 ? Cancer Care: Provides financial assistance, online support groups, medication/co-pay assistance.  1-800-813-HOPE (902)126-1578) ? [facility] Assists [place] cancer patients and their families through emotional , educational and  financial support.  404-502-8142 ? [place] DSS Where to apply for food stamps, Medicaid and utility assistance. (831) 386-6280 ? [facility]: Transportation to medical appointments. 9805492767 ? Social Security Administration: May apply for disability if have a Stage IV cancer. (780)055-2264 7278210664 ? [employer], Disability and Transit Services: Assists with nutrition, care and transit needs. [place] Support Programs:   > Cancer Support Group  2nd Tuesday of the month 1pm-2pm, Journey Room   > Creative Journey  3rd Tuesday of the month 1130am-1pm, Journey Room

## 2017-12-01 NOTE — Anesthesia Preprocedure Evaluation (Addendum)
Anesthesia Evaluation  Patient identified by MRN, date of birth, ID band Patient awake    Reviewed: Allergy & Precautions, NPO status , Patient's Chart, lab work & pertinent test results  History of Anesthesia Complications Negative for: history of anesthetic complications  Airway Mallampati: I  TM Distance: >3 FB Neck ROM: Limited   Comment: Limited extension and lateral ROM. Dental  (+) Missing, Poor Dentition, Dental Advisory Given, Edentulous Upper   Pulmonary asthma , COPD, former smoker,    breath sounds clear to auscultation       Cardiovascular hypertension,  Rhythm:Regular Rate:Normal     Neuro/Psych  Headaches, Seizures -,  Anxiety CVA    GI/Hepatic GERD  ,(+) Hepatitis -  Endo/Other  diabetes, Type 2  Renal/GU      Musculoskeletal  (+) Arthritis ,   Abdominal   Peds  Hematology negative hematology ROS (+)   Anesthesia Other Findings   Reproductive/Obstetrics                            Anesthesia Physical Anesthesia Plan  ASA: IV  Anesthesia Plan: MAC   Post-op Pain Management:    Induction: Intravenous  PONV Risk Score and Plan:   Airway Management Planned: Simple Face Mask  Additional Equipment:   Intra-op Plan:   Post-operative Plan:   Informed Consent: I have reviewed the patients History and Physical, chart, labs and discussed the procedure including the risks, benefits and alternatives for the proposed anesthesia with the patient or authorized representative who has indicated his/her understanding and acceptance.     Plan Discussed with:   Anesthesia Plan Comments: (CBG=84)        Anesthesia Quick Evaluation

## 2017-12-01 NOTE — Anesthesia Postprocedure Evaluation (Signed)
Anesthesia Post Note  Patient: [name] [name]  Procedure(s) Performed: INSERTION PORT-A-CATH (Right Chest)  Patient location during evaluation: PACU Anesthesia Type: MAC Level of consciousness: awake and alert, oriented and patient cooperative Pain management: pain level controlled Vital Signs Assessment: post-procedure vital signs reviewed and stable Respiratory status: spontaneous breathing, nonlabored ventilation, respiratory function stable and patient connected to face mask oxygen Cardiovascular status: stable Postop Assessment: no apparent nausea or vomiting Anesthetic complications: no     Last Vitals:  Vitals Value Taken Time  BP 106/55 12/01/2017 11:05 AM  Temp 36.6 C 12/01/2017 11:05 AM  Pulse 68 12/01/2017 11:08 AM  Resp 12 12/01/2017 11:08 AM  SpO2 97 % 12/01/2017 11:08 AM  Vitals shown include unvalidated device data.  Last Pain:  Vitals:   12/01/17 0938  TempSrc: Oral  PainSc: 4                  Jeanita Carneiro

## 2017-12-01 NOTE — Op Note (Signed)
Patient:  [name] [name]  DOB:  09-Apr-1961  MRN:  [id number]   Preop Diagnosis: Lung carcinoma, need for central venous access  Postop Diagnosis: Same  Procedure: Port-A-Cath insertion  Surgeon: Aviva Signs, MD  Anes: MAC  Indications: Patient is a 57 year old white male with lung cancer who is about to start chemotherapy.  He needs central venous access.  The risks and benefits of the procedure including bleeding, infection, and the possibility of pneumothorax were fully explained to the patient, who gave informed consent.  Procedure note: The patient was placed in Trendelenburg position after the right upper chest was prepped and draped using the usual sterile technique with ChloraPrep.  Surgical site confirmation was performed.  1% Xylocaine was used for local anesthesia.  Incision was made below the right clavicle.  A subcutaneous pocket was formed.  The needle was advanced into the right subclavian vein using the Seldinger technique without difficulty.  A guidewire was then advanced into the right atrium under fluoroscopic guidance.  An introducer and peel-away sheath were placed over the guidewire.  The catheter was inserted through the peel-away sheath and the peel-away sheath was removed.  The catheter was then attached to the port and the port placed in subcutaneous pocket.  Adequate positioning was confirmed by fluoroscopy.  Good backflow of blood was noted on the port with aspiration.  The port was flushed with heparin flush.  Subcutaneous layer was reapproximated using a 3-0 Vicryl interrupted suture.  The skin was closed using a 4-0 Monocryl subcuticular suture.  Dermabond was applied.  All tape and needle counts were correct at the end of the procedure.  The patient was awakened and transferred to PACU in stable condition.  A chest x-ray will be performed at that time.  Complications: None  EBL: Minimal  Specimen: None

## 2017-12-01 NOTE — Transfer of Care (Signed)
Immediate Anesthesia Transfer of Care Note  Patient: [name] [name]  Procedure(s) Performed: INSERTION PORT-A-CATH (Right Chest)  Patient Location: PACU  Anesthesia Type:MAC  Level of Consciousness: awake, alert  and oriented  Airway & Oxygen Therapy: Patient Spontanous Breathing and Patient connected to nasal cannula oxygen  Post-op Assessment: Report given to RN and Post -op Vital signs reviewed and stable  Post vital signs: Reviewed and stable  Last Vitals:  Vitals Value Taken Time  BP    Temp    Pulse 66 12/01/2017 11:03 AM  Resp    SpO2 96 % 12/01/2017 11:03 AM  Vitals shown include unvalidated device data.  Last Pain:  Vitals:   12/01/17 0938  TempSrc: Oral  PainSc: 4          Complications: No apparent anesthesia complications

## 2017-12-01 NOTE — Interval H&P Note (Signed)
History and Physical Interval Note:  12/01/2017 9:25 AM  [name] [name]  has presented today for surgery, with the diagnosis of lung cancer  The various methods of treatment have been discussed with the patient and family. After consideration of risks, benefits and other options for treatment, the patient has consented to  Procedure(s): INSERTION PORT-A-CATH (N/A) as a surgical intervention .  The patient's history has been reviewed, patient examined, no change in status, stable for surgery.  I have reviewed the patient's chart and labs.  Questions were answered to the patient's satisfaction.     Aviva Signs

## 2017-12-04 ENCOUNTER — Encounter (HOSPITAL_COMMUNITY): Payer: Self-pay | Admitting: General Surgery

## 2017-12-07 ENCOUNTER — Other Ambulatory Visit (HOSPITAL_COMMUNITY): Payer: Medicare HMO

## 2017-12-07 ENCOUNTER — Ambulatory Visit (HOSPITAL_COMMUNITY): Payer: Medicare HMO | Admitting: Adult Health

## 2017-12-07 DIAGNOSIS — I10 Essential (primary) hypertension: Secondary | ICD-10-CM | POA: Diagnosis not present

## 2017-12-07 DIAGNOSIS — Z6826 Body mass index (BMI) 26.0-26.9, adult: Secondary | ICD-10-CM | POA: Diagnosis not present

## 2017-12-07 DIAGNOSIS — E782 Mixed hyperlipidemia: Secondary | ICD-10-CM | POA: Diagnosis not present

## 2017-12-07 DIAGNOSIS — I208 Other forms of angina pectoris: Secondary | ICD-10-CM | POA: Diagnosis not present

## 2017-12-10 ENCOUNTER — Other Ambulatory Visit (HOSPITAL_COMMUNITY): Payer: Self-pay | Admitting: Hematology

## 2017-12-11 NOTE — Anesthesia Postprocedure Evaluation (Signed)
Anesthesia Post Note  Patient: [name] [name]  Procedure(s) Performed: VIDEO BRONCHOSCOPY WITH ENDOBRONCHIAL ULTRASOUND (N/A )     Patient location during evaluation: PACU Anesthesia Type: General Level of consciousness: awake and alert Pain management: pain level controlled Vital Signs Assessment: post-procedure vital signs reviewed and stable Respiratory status: spontaneous breathing, nonlabored ventilation, respiratory function stable and patient connected to nasal cannula oxygen Cardiovascular status: blood pressure returned to baseline and stable Postop Assessment: no apparent nausea or vomiting Anesthetic complications: no    Last Vitals:  Vitals:   11/22/17 1515 11/22/17 1530  BP: 113/73 136/69  Pulse: 74 83  Resp: 16 20  Temp: 36.5 C   SpO2: 100% 96%    Last Pain:  Vitals:   11/22/17 1545  TempSrc:   PainSc: 0-No pain                 Anuhea Gassner,JAMES TERRILL

## 2017-12-12 ENCOUNTER — Other Ambulatory Visit (HOSPITAL_COMMUNITY): Payer: Self-pay | Admitting: Adult Health

## 2017-12-12 ENCOUNTER — Encounter (HOSPITAL_COMMUNITY): Payer: Self-pay

## 2017-12-12 ENCOUNTER — Encounter (HOSPITAL_COMMUNITY): Payer: Self-pay | Admitting: Adult Health

## 2017-12-12 DIAGNOSIS — L03114 Cellulitis of left upper limb: Secondary | ICD-10-CM

## 2017-12-12 DIAGNOSIS — G4730 Sleep apnea, unspecified: Secondary | ICD-10-CM | POA: Diagnosis not present

## 2017-12-12 DIAGNOSIS — G4700 Insomnia, unspecified: Secondary | ICD-10-CM

## 2017-12-12 MED ORDER — CEPHALEXIN 500 MG PO CAPS: 500.0000 mg | ORAL_CAPSULE | Freq: Four times a day (QID) | ORAL | 0 refills | Status: DC

## 2017-12-12 MED ORDER — TEMAZEPAM 30 MG PO CAPS: 30.0000 mg | ORAL_CAPSULE | Freq: Every evening | ORAL | 1 refills | Status: DC | PRN

## 2017-12-12 NOTE — Progress Notes (Signed)
Patient came by [facility] to let us see his right arm where he had an IV recently. The area is pink, about 3 1/2 inches in diameter, and has a hard area in the middle. It is very sore to touch but not warm. Patient states he ran a low grade fever yesterday of 99.3. VS today are 98.8-temp, 77-pulse, 16-resp, 168/72-BP, and 98%  O2 sat. Reviewed with Tremel Craze , NP. See her note.

## 2017-12-12 NOTE — Progress Notes (Signed)
[name] [name] was brought in by nursing for concerns re: (L) arm redness and warmth since his recent chemotherapy was given via PIV.  I was asked to see patient today by Jaynie Collins, LPN.   Patient denies any pain to the area, but has noted that the area of redness "has spread from about 3 inches to about double that."  Denies any high fevers; endorses periodic low-grade temp of 99 degrees recently.   Received Carbo/Etoposide/Tecentriq on 11/27/17, followed by Etoposide on days 2 and 3 (3/19 & 3/20).   Briefly examined patient today. See photo. Mild redness with small firm nodule located at previous PIV site.  No exudate noted.   Will e-scribe Keflex 500 mg QID x 5 days for presumed cellulitis.    Patient also requesting something to help him sleep; he is not sleeping well. Xanax only minimally effective. E-scribed Temazepam 30 mg to his pharmacy using [employer] 2-step verification process.  Gave him strict instruction not to take Xanax and Temazepam at the same time.    Photo of pt's (L) arm taken with verbal permission from patient, in the presence of Forest Gleason, RN and Jaynie Collins, LPN:   [name] Craze, NP [place] [phone]

## 2017-12-18 ENCOUNTER — Inpatient Hospital Stay (HOSPITAL_BASED_OUTPATIENT_CLINIC_OR_DEPARTMENT_OTHER): Payer: Medicare HMO | Admitting: Hematology

## 2017-12-18 ENCOUNTER — Encounter (HOSPITAL_COMMUNITY): Payer: Self-pay | Admitting: Hematology

## 2017-12-18 ENCOUNTER — Inpatient Hospital Stay (HOSPITAL_COMMUNITY): Payer: Medicare HMO | Attending: Hematology

## 2017-12-18 VITALS — BP 123/70 | HR 76 | Temp 98.5°F | Resp 16 | Wt 174.6 lb

## 2017-12-18 VITALS — BP 149/69 | HR 77 | Temp 98.6°F | Resp 16

## 2017-12-18 DIAGNOSIS — Z87891 Personal history of nicotine dependence: Secondary | ICD-10-CM | POA: Insufficient documentation

## 2017-12-18 DIAGNOSIS — R200 Anesthesia of skin: Secondary | ICD-10-CM

## 2017-12-18 DIAGNOSIS — I10 Essential (primary) hypertension: Secondary | ICD-10-CM | POA: Diagnosis not present

## 2017-12-18 DIAGNOSIS — K219 Gastro-esophageal reflux disease without esophagitis: Secondary | ICD-10-CM | POA: Diagnosis not present

## 2017-12-18 DIAGNOSIS — Z8673 Personal history of transient ischemic attack (TIA), and cerebral infarction without residual deficits: Secondary | ICD-10-CM | POA: Insufficient documentation

## 2017-12-18 DIAGNOSIS — Z7189 Other specified counseling: Secondary | ICD-10-CM

## 2017-12-18 DIAGNOSIS — J449 Chronic obstructive pulmonary disease, unspecified: Secondary | ICD-10-CM | POA: Diagnosis not present

## 2017-12-18 DIAGNOSIS — C3490 Malignant neoplasm of unspecified part of unspecified bronchus or lung: Secondary | ICD-10-CM

## 2017-12-18 DIAGNOSIS — L03114 Cellulitis of left upper limb: Secondary | ICD-10-CM | POA: Diagnosis not present

## 2017-12-18 DIAGNOSIS — Z79899 Other long term (current) drug therapy: Secondary | ICD-10-CM | POA: Insufficient documentation

## 2017-12-18 DIAGNOSIS — Z5111 Encounter for antineoplastic chemotherapy: Secondary | ICD-10-CM | POA: Diagnosis present

## 2017-12-18 LAB — COMPREHENSIVE METABOLIC PANEL
ALT: 14 U/L — AB (ref 17–63)
AST: 15 U/L (ref 15–41)
Albumin: 3.5 g/dL (ref 3.5–5.0)
Alkaline Phosphatase: 115 U/L (ref 38–126)
Anion gap: 9 (ref 5–15)
BILIRUBIN TOTAL: 0.9 mg/dL (ref 0.3–1.2)
BUN: 12 mg/dL (ref 6–20)
CO2: 25 mmol/L (ref 22–32)
CREATININE: 0.9 mg/dL (ref 0.61–1.24)
Calcium: 9.1 mg/dL (ref 8.9–10.3)
Chloride: 104 mmol/L (ref 101–111)
GFR calc Af Amer: >60 mL/min (ref >60)
Glucose, Bld: 112 mg/dL — ABNORMAL HIGH (ref 65–99)
Potassium: 4 mmol/L (ref 3.5–5.1)
Sodium: 138 mmol/L (ref 135–145)
TOTAL PROTEIN: 6.9 g/dL (ref 6.5–8.1)

## 2017-12-18 LAB — CBC WITH DIFFERENTIAL/PLATELET
BASOS ABS: 0.1 10*3/uL (ref 0.0–0.1)
Basophils Relative: 2 %
EOS ABS: 0.1 10*3/uL (ref 0.0–0.7)
EOS PCT: 1 %
HCT: 36.6 % — ABNORMAL LOW (ref 39.0–52.0)
Hemoglobin: 12.2 g/dL — ABNORMAL LOW (ref 13.0–17.0)
Lymphocytes Relative: 19 %
Lymphs Abs: 1.2 10*3/uL (ref 0.7–4.0)
MCH: 30 pg (ref 26.0–34.0)
MCHC: 33.3 g/dL (ref 30.0–36.0)
MCV: 89.9 fL (ref 78.0–100.0)
Monocytes Absolute: 0.6 10*3/uL (ref 0.1–1.0)
Monocytes Relative: 10 %
Neutro Abs: 4.4 10*3/uL (ref 1.7–7.7)
Neutrophils Relative %: 68 %
PLATELETS: 264 10*3/uL (ref 150–400)
RBC: 4.07 MIL/uL — AB (ref 4.22–5.81)
RDW: 13.1 % (ref 11.5–15.5)
WBC: 6.3 10*3/uL (ref 4.0–10.5)

## 2017-12-18 MED ORDER — DEXAMETHASONE SODIUM PHOSPHATE 10 MG/ML IJ SOLN
10.0000 mg | Freq: Once | INTRAMUSCULAR | Status: AC
  Administered 2017-12-18: 10 mg via INTRAVENOUS
  Filled 2017-12-18: qty 1

## 2017-12-18 MED ORDER — CARBOPLATIN CHEMO INJECTION 600 MG/60ML
632.5000 mg | Freq: Once | INTRAVENOUS | Status: AC
  Administered 2017-12-18: 630 mg via INTRAVENOUS
  Filled 2017-12-18: qty 63

## 2017-12-18 MED ORDER — SODIUM CHLORIDE 0.9 % IV SOLN
1200.0000 mg | Freq: Once | INTRAVENOUS | Status: AC
  Administered 2017-12-18: 1200 mg via INTRAVENOUS
  Filled 2017-12-18: qty 20

## 2017-12-18 MED ORDER — PALONOSETRON HCL INJECTION 0.25 MG/5ML
0.2500 mg | Freq: Once | INTRAVENOUS | Status: AC
  Administered 2017-12-18: 0.25 mg via INTRAVENOUS
  Filled 2017-12-18: qty 5

## 2017-12-18 MED ORDER — SODIUM CHLORIDE 0.9 % IV SOLN: 10.0000 mg | Freq: Once | INTRAVENOUS | Status: DC

## 2017-12-18 MED ORDER — SODIUM CHLORIDE 0.9 % IV SOLN
100.0000 mg/m2 | Freq: Once | INTRAVENOUS | Status: AC
  Administered 2017-12-18: 200 mg via INTRAVENOUS
  Filled 2017-12-18: qty 10

## 2017-12-18 MED ORDER — SODIUM CHLORIDE 0.9 % IV SOLN
Freq: Once | INTRAVENOUS | Status: AC
  Administered 2017-12-18: 500 mL via INTRAVENOUS

## 2017-12-18 MED ORDER — SODIUM CHLORIDE 0.9% FLUSH
10.0000 mL | INTRAVENOUS | Status: DC | PRN
  Administered 2017-12-18 (×2): 10 mL
  Filled 2017-12-18 (×2): qty 10

## 2017-12-18 MED ORDER — HEPARIN SOD (PORK) LOCK FLUSH 100 UNIT/ML IV SOLN
500.0000 [IU] | Freq: Once | INTRAVENOUS | Status: AC | PRN
  Administered 2017-12-18: 500 [IU]
  Filled 2017-12-18: qty 5

## 2017-12-18 MED ORDER — SODIUM CHLORIDE 0.9 % IV SOLN: Freq: Once | INTRAVENOUS | Status: DC

## 2017-12-18 NOTE — Progress Notes (Signed)
[name] [name], [place] [address]   CLINIC:  Medical Oncology/Hematology  PCP:  [facility], [place] [address] [phone]   REASON FOR VISIT:  Follow-up for extensive stage small cell lung cancer.  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 57 y.o. male returns for cycle 2 of chemo immunotherapy.  He tolerated his first cycle very well.  He had slight nausea couple of times.  He denied any vomiting.  No diarrhea was reported.  He had to take MiraLAX and stool softener for constipation.  In the last 10 days he has noticed more frequent numbness in the left upper and lower extremity.  He also noticed some numbness on and off in the face.  He had numbness in the left upper and lower extremities since neck surgery for many years.  2 weeks after getting chemotherapy, he developed erythema and pain with low-grade fever in the left forearm.  He was evaluated by our nurse practitioner last week and was started on Keflex.  The swelling and the pain has improved.  He still has 1 more Keflex left.  Today he is afebrile.  He noticed some pain at the area of injection of Neulasta.  REVIEW OF SYSTEMS:  Review of Systems  Constitutional: Positive for fatigue and fever.  HENT:  Negative.   Respiratory: Negative.   Cardiovascular: Negative.   Gastrointestinal: Positive for constipation.  Genitourinary: Negative.    Musculoskeletal: Negative.   Skin: Negative.   Neurological: Positive for numbness.  Hematological: Negative.   Psychiatric/Behavioral: Negative.      PAST MEDICAL/SURGICAL HISTORY:  Past Medical History:    Diagnosis Date  . Anxiety   . Asthma   . COPD (chronic obstructive pulmonary disease) ([place])   . DDD (degenerative disc disease)   . Diverticulitis   . GERD (gastroesophageal reflux disease)   . Headache   . Hepatitis    in the late 70's   . HTN (hypertension)   . Mediastinal adenopathy   . Seizures ([place])    as of 2019 none for 20 years  . Sleep apnea    does not  use cpap  . Stroke [facility])    TIA - Feb. 2018   Past Surgical History:  Procedure Laterality Date  . CERVICAL DISC SURGERY     X4  . CHOLECYSTECTOMY    . COLONOSCOPY  08/24/2011   Procedure: COLONOSCOPY;  Surgeon: Daneil Dolin, MD;  Location: [facility];  Service: Endoscopy;  Laterality: N/A;  7:30AM  . ELBOW SURGERY     right  . PORTACATH PLACEMENT Right 12/01/2017   Procedure: INSERTION PORT-A-CATH;  Surgeon: Aviva Signs, MD;  Location: [facility] ORS;  Service: General;  Laterality: Right;  Marland Kitchen VIDEO BRONCHOSCOPY WITH ENDOBRONCHIAL ULTRASOUND N/A 10/31/2017   Procedure: VIDEO BRONCHOSCOPY WITH ENDOBRONCHIAL ULTRASOUND;  Surgeon: Ivin Poot, MD;  Location: [facility];  Service: Thoracic;  Laterality: N/A;  . VIDEO BRONCHOSCOPY WITH ENDOBRONCHIAL ULTRASOUND N/A 11/22/2017   Procedure: VIDEO BRONCHOSCOPY WITH ENDOBRONCHIAL ULTRASOUND;  Surgeon: Melrose Nakayama, MD;  Location: [facility];  Service: Thoracic;  Laterality: N/A;     SOCIAL HISTORY:  Social History   Socioeconomic  History  . Marital status: Married    Spouse name: Not on file  . Number of children: Not on file  . Years of education: Not on file  . Highest education level: Not on file  Occupational History  . Occupation: disability  Social Needs  . Financial resource strain: Not on file  . Food insecurity:    Worry: Not on file    Inability: Not on file  . Transportation needs:    Medical: Not on file    Non-medical: Not on file  Tobacco Use  . Smoking status: Former Smoker    Packs/day: 1.50    Years: 30.00    Pack years: 45.00    Types:  Cigarettes    Last attempt to quit: 10/13/2017    Years since quitting: 0.1  . Smokeless tobacco: Never Used  Substance and Sexual Activity  . Alcohol use: No  . Drug use: No  . Sexual activity: Yes    Birth control/protection: None  Lifestyle  . Physical activity:    Days per week: Not on file    Minutes per session: Not on file  . Stress: Not on file  Relationships  . Social connections:    Talks on phone: Not on file    Gets together: Not on file    Attends religious service: Not on file    Active member of club or organization: Not on file    Attends meetings of clubs or organizations: Not on file    Relationship status: Not on file  . Intimate partner violence:    Fear of current or ex partner: Not on file    Emotionally abused: Not on file    Physically abused: Not on file    Forced sexual activity: Not on file  Other Topics Concern  . Not on file  Social History Narrative  . Not on file    FAMILY HISTORY:  Family History  Problem Relation Age of Onset  . Hypertension Father   . Heart attack Father   . Kidney failure Mother   . Diabetes Mother   . Hypertension Mother   . Colon cancer Neg Hx   . Anesthesia problems Neg Hx   . Hypotension Neg Hx   . Malignant hyperthermia Neg Hx   . Pseudochol deficiency Neg Hx     CURRENT MEDICATIONS:  Outpatient Encounter Medications as of 12/18/2017  Medication Sig Note  . albuterol (PROVENTIL HFA;VENTOLIN HFA) 108 (90 BASE) MCG/ACT inhaler Inhale 2 puffs into the lungs every 6 (six) hours as needed for wheezing or shortness of breath.    Marland Kitchen albuterol (PROVENTIL) (2.5 MG/3ML) 0.083% nebulizer solution Take 2.5 mg by nebulization every 6 (six) hours as needed for wheezing or shortness of breath.    . alprazolam (XANAX) 2 MG tablet Take 2 mg by mouth every 6 (six) hours as needed for sleep or anxiety.    Marland Kitchen amLODipine (NORVASC) 10 MG tablet Take 10 mg by mouth daily.   Huey Bienenstock (TECENTRIQ IV) Inject into the vein. Every 21  days   . CARBOPLATIN IV Inject into the vein. Every 21 days   . cephALEXin (KEFLEX) 500 MG capsule Take 1 capsule (500 mg total) by mouth 4 (four) times daily.   . cyclobenzaprine (FLEXERIL) 10 MG tablet Take 10 mg by mouth daily as needed for muscle spasms.    Marland Kitchen docusate sodium (COLACE) 100 MG capsule Take 1 capsule (100 mg total) by mouth every 12 (twelve) hours.   Marland Kitchen  ETOPOSIDE IV Inject into the vein. Days 1,2,3 every 21 days   . fluticasone (FLONASE) 50 MCG/ACT nasal spray Place 2 sprays into both nostrils daily as needed for allergies.    . Glucosamine-Chondroit-Vit C-Mn (GLUCOSAMINE 1500 COMPLEX) CAPS Take 1 capsule by mouth.   . levETIRAcetam (KEPPRA) 500 MG tablet Take 500 mg by mouth daily.    Marland Kitchen lidocaine-prilocaine (EMLA) cream Apply 1 application topically as needed. Apply to portacath site as directed   . lisinopril (PRINIVIL,ZESTRIL) 20 MG tablet Take 20 mg by mouth 2 (two) times daily.   . meclizine (ANTIVERT) 12.5 MG tablet Take 1 tablet (12.5 mg total) by mouth 3 (three) times daily as needed for dizziness.   . MULTIPLE VITAMINS PO Take 1 tablet by mouth.   . naproxen (NAPROSYN) 500 MG tablet Take 500 mg by mouth 2 (two) times daily with a meal.     . Omega-3 Fatty Acids (FISH OIL) 1000 MG CAPS Take 1,000 mg by mouth 2 (two) times daily.  10/26/2017: Stopped for procedure  . ondansetron (ZOFRAN) 8 MG tablet Take 1 tablet (8 mg total) by mouth 2 (two) times daily as needed for refractory nausea / vomiting. Start on day 3 after carboplatin chemo.   Marland Kitchen oxyCODONE (ROXICODONE) 15 MG immediate release tablet Take 15 mg by mouth 4 (four) times daily as needed for pain.    . polyethylene glycol (MIRALAX / GLYCOLAX) packet Take 17 g by mouth daily.   . prochlorperazine (COMPAZINE) 10 MG tablet Take 1 tablet (10 mg total) by mouth every 6 (six) hours as needed (Nausea or vomiting).   . tamsulosin (FLOMAX) 0.4 MG CAPS capsule Take 0.4 mg by mouth daily.    . temazepam (RESTORIL) 30 MG capsule  Take 1 capsule (30 mg total) by mouth at bedtime as needed for sleep.   . TURMERIC PO Take 4 tablets by mouth.    No facility-administered encounter medications on file as of 12/18/2017.     ALLERGIES:  No Known Allergies   PHYSICAL EXAM:  ECOG Performance status: 1  Vitals:   12/18/17 0934  BP: 123/70  Pulse: 76  Resp: 16  Temp: 98.5 F (36.9 C)  SpO2: 98%   Filed Weights   12/18/17 0934  Weight: 174 lb 9.6 oz (79.2 kg)    Physical Exam  Constitutional: He is oriented to person, place, and time and well-developed, well-nourished, and in no distress.  Neurological: He is alert and oriented to person, place, and time.  Psychiatric: Mood, memory, affect and judgment normal.   Left forearm has a small area of erythema and hardness.  LABORATORY DATA:  I have reviewed the labs as listed.  CBC    Component Value Date/Time   WBC 6.3 12/18/2017 0930   RBC 4.07 (L) 12/18/2017 0930   HGB 12.2 (L) 12/18/2017 0930   HCT 36.6 (L) 12/18/2017 0930   PLT 264 12/18/2017 0930   MCV 89.9 12/18/2017 0930   MCH 30.0 12/18/2017 0930   MCHC 33.3 12/18/2017 0930   RDW 13.1 12/18/2017 0930   LYMPHSABS 1.2 12/18/2017 0930   MONOABS 0.6 12/18/2017 0930   EOSABS 0.1 12/18/2017 0930   BASOSABS 0.1 12/18/2017 0930   CMP Latest Ref Rng & Units 12/18/2017 11/27/2017 11/27/2017  Glucose 65 - 99 mg/dL 112(H) 153(H) 117(H)  BUN 6 - 20 mg/dL 12 17 18   Creatinine 0.61 - 1.24 mg/dL 0.90 0.90 1.03  Sodium 135 - 145 mmol/L 138 131(L) 134(L)  Potassium 3.5 - 5.1  mmol/L 4.0 4.5 4.5  Chloride 101 - 111 mmol/L 104 97(L) 101  CO2 22 - 32 mmol/L 25 21(L) 24  Calcium 8.9 - 10.3 mg/dL 9.1 8.9 9.0  Total Protein 6.5 - 8.1 g/dL 6.9 - 6.7  Total Bilirubin 0.3 - 1.2 mg/dL 0.9 - 0.9  Alkaline Phos 38 - 126 U/L 115 - 95  AST 15 - 41 U/L 15 - 18  ALT 17 - 63 U/L 14(L) - 15(L)       DIAGNOSTIC IMAGING:  I have reviewed MRI of the brain dated 11/24/2017 which did not show any evidence of metastatic  disease.     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  Extensive stage small cell lung cancer: -Cycle 1 of carboplatin, etoposide and Atezolizumab on 11/27/2017, tolerated well except slight nausea couple of times. -His blood count adequate to proceed with cycle 2 without any dose modifications.  He will come back in 3 weeks for cycle 3.  I plan to repeat CT scans after cycle 3. - We spent considerable amount of time today, discussing again about the prognosis of his terminal cancer and palliative intent of treatment.  2.  Left forearm cellulitis: He has developed cellulitis 2 weeks after receiving first cycle of chemotherapy.  Our nurse practitioner has seen him and prescribed Keflex.  He still has 1 more pill left.  There was significant improvement, although there is a small area of erythema measuring about 1 cm.  He was instructed to use warm compresses.  3.  Numbness: He had numbness in his left arm and left leg for many years since his surgery in the neck.  He has noticed slight worsening of the numbness on and off since his first cycle of chemotherapy.  He also developed numbness in the face for the last 10 days and it is on and off.  We reviewed the results of the MRI of the brain dated 11/24/2017 which did not show any evidence of metastatic disease.      Orders placed this encounter:  Orders Placed This Encounter  Procedures  . CBC with Differential  . Comprehensive metabolic panel  . Magnesium      Derek Jack, MD [place] [phone]

## 2017-12-18 NOTE — Progress Notes (Signed)
Patient to treatment area after oncology follow up visit.    Patient tolerated chemotherapy with no complaints voiced.  Port site clean and dry with no bruising or swelling noted at site.  Flushed easily with no complaints of pain.  Good blood return noted before and after administration of chemotherapy.  Port needle reinforced with a tegaderm and flushed per protocol.  VSS with discharge and left ambulatory with wife with no s/s of distress noted.

## 2017-12-18 NOTE — Patient Instructions (Signed)
[facility] at [facility] Discharge Instructions  You saw Dr. Katragadda today.   Thank you for choosing [facility] at [facility] to provide your oncology and hematology care.  To afford each patient quality time with our provider, please arrive at least 15 minutes before your scheduled appointment time.   If you have a lab appointment with the Cancer Center please come in thru the  Main Entrance and check in at the main information desk  You need to re-schedule your appointment should you arrive 10 or more minutes late.  We strive to give you quality time with our providers, and arriving late affects you and other patients whose appointments are after yours.  Also, if you no show three or more times for appointments you may be dismissed from the clinic at the providers discretion.     Again, thank you for choosing [facility].  Our hope is that these requests will decrease the amount of time that you wait before being seen by our physicians.       _____________________________________________________________  Should you have questions after your visit to [facility], please contact our office at [phone] between the hours of 8:30 a.m. and 4:30 p.m.  Voicemails left after 4:30 p.m. will not be returned until the following business day.  For prescription refill requests, have your pharmacy contact our office.       Resources For Cancer Patients and their Caregivers ? American Cancer Society: Can assist with transportation, wigs, general needs, runs Look Good Feel Better.        1-888-227-6333 ? Cancer Care: Provides financial assistance, online support groups, medication/co-pay assistance.  1-800-813-HOPE (4673) ? [facility] Assists [place] cancer patients and their families through emotional , educational and financial support.  336-427-4357 ? [place] DSS Where to apply for  food stamps, Medicaid and utility assistance. 336-342-1394 ? [facility]: Transportation to medical appointments. 336-347-2287 ? Social Security Administration: May apply for disability if have a Stage IV cancer. 336-342-7796 1-800-772-1213 ? [place] Aging, Disability and Transit Services: Assists with nutrition, care and transit needs. 336-349-2343  Cancer Center Support Programs:   > Cancer Support Group  2nd Tuesday of the month 1pm-2pm, Journey Room   > Creative Journey  3rd Tuesday of the month 1130am-1pm, Journey Room     

## 2017-12-18 NOTE — Addendum Note (Signed)
Addendum  created 12/18/17 1340 by Rica Koyanagi, MD   Sign clinical note

## 2017-12-18 NOTE — Patient Instructions (Signed)
[facility] Discharge Instructions for Patients Receiving Chemotherapy  Today you received the following chemotherapy agents Tecentriq, etoposide, and carboplatin.    If you develop nausea and vomiting that is not controlled by your nausea medication, call the clinic.   BELOW ARE SYMPTOMS THAT SHOULD BE REPORTED IMMEDIATELY:  *FEVER GREATER THAN 100.5 F  *CHILLS WITH OR WITHOUT FEVER  NAUSEA AND VOMITING THAT IS NOT CONTROLLED WITH YOUR NAUSEA MEDICATION  *UNUSUAL SHORTNESS OF BREATH  *UNUSUAL BRUISING OR BLEEDING  TENDERNESS IN MOUTH AND THROAT WITH OR WITHOUT PRESENCE OF ULCERS  *URINARY PROBLEMS  *BOWEL PROBLEMS  UNUSUAL RASH Items with * indicate a potential emergency and should be followed up as soon as possible.  Feel free to call the clinic should you have any questions or concerns. The clinic phone number is [phone].  Please show the [place] at check-in to the Emergency Department and triage nurse.

## 2017-12-18 NOTE — Anesthesia Postprocedure Evaluation (Signed)
Anesthesia Post Note  Patient: [name] [name]  Procedure(s) Performed: VIDEO BRONCHOSCOPY WITH ENDOBRONCHIAL ULTRASOUND (N/A )     Patient location during evaluation: PACU Anesthesia Type: General Level of consciousness: awake and sedated Pain management: pain level controlled Vital Signs Assessment: post-procedure vital signs reviewed and stable Respiratory status: spontaneous breathing, nonlabored ventilation, respiratory function stable and patient connected to nasal cannula oxygen Cardiovascular status: blood pressure returned to baseline and stable Postop Assessment: no apparent nausea or vomiting Anesthetic complications: no    Last Vitals:  Vitals:   11/22/17 1515 11/22/17 1530  BP: 113/73 136/69  Pulse: 74 83  Resp: 16 20  Temp: 36.5 C   SpO2: 100% 96%    Last Pain:  Vitals:   11/22/17 1545  TempSrc:   PainSc: 0-No pain                 Malasia Torain,JAMES TERRILL

## 2017-12-18 NOTE — Assessment & Plan Note (Signed)
1.  Extensive stage small cell lung cancer: -Cycle 1 of carboplatin, etoposide and Atezolizumab on 11/27/2017, tolerated well except slight nausea couple of times. -His blood count adequate to proceed with cycle 2 without any dose modifications.  He will come back in 3 weeks for cycle 3.  I plan to repeat CT scans after cycle 3. - We spent considerable amount of time today, discussing again about the prognosis of his terminal cancer and palliative intent of treatment.  2.  Left forearm cellulitis: He has developed cellulitis 2 weeks after receiving first cycle of chemotherapy.  Our nurse practitioner has seen him and prescribed Keflex.  He still has 1 more pill left.  There was significant improvement, although there is a small area of erythema measuring about 1 cm.  He was instructed to use warm compresses.  3.  Numbness: He had numbness in his left arm and left leg for many years since his surgery in the neck.  He has noticed slight worsening of the numbness on and off since his first cycle of chemotherapy.  He also developed numbness in the face for the last 10 days and it is on and off.  We reviewed the results of the MRI of the brain dated 11/24/2017 which did not show any evidence of metastatic disease.

## 2017-12-19 ENCOUNTER — Ambulatory Visit (HOSPITAL_COMMUNITY): Payer: Medicare HMO

## 2017-12-19 ENCOUNTER — Encounter (HOSPITAL_COMMUNITY): Payer: Self-pay

## 2017-12-19 ENCOUNTER — Inpatient Hospital Stay (HOSPITAL_COMMUNITY): Payer: Medicare HMO

## 2017-12-19 VITALS — BP 133/69 | HR 64 | Temp 97.9°F | Resp 18 | Wt 175.4 lb

## 2017-12-19 DIAGNOSIS — C3490 Malignant neoplasm of unspecified part of unspecified bronchus or lung: Secondary | ICD-10-CM

## 2017-12-19 DIAGNOSIS — Z5111 Encounter for antineoplastic chemotherapy: Secondary | ICD-10-CM | POA: Diagnosis not present

## 2017-12-19 DIAGNOSIS — Z7189 Other specified counseling: Secondary | ICD-10-CM

## 2017-12-19 MED ORDER — SODIUM CHLORIDE 0.9 % IV SOLN
10.0000 mg | Freq: Once | INTRAVENOUS | Status: DC
  Filled 2017-12-19: qty 1

## 2017-12-19 MED ORDER — HEPARIN SOD (PORK) LOCK FLUSH 100 UNIT/ML IV SOLN
500.0000 [IU] | Freq: Once | INTRAVENOUS | Status: AC | PRN
  Administered 2017-12-19: 500 [IU]

## 2017-12-19 MED ORDER — SODIUM CHLORIDE 0.9 % IV SOLN
100.0000 mg/m2 | Freq: Once | INTRAVENOUS | Status: AC
  Administered 2017-12-19: 200 mg via INTRAVENOUS
  Filled 2017-12-19: qty 10

## 2017-12-19 MED ORDER — SODIUM CHLORIDE 0.9 % IV SOLN
Freq: Once | INTRAVENOUS | Status: AC
  Administered 2017-12-19: 09:00:00 via INTRAVENOUS

## 2017-12-19 MED ORDER — SODIUM CHLORIDE 0.9% FLUSH
10.0000 mL | INTRAVENOUS | Status: DC | PRN
  Administered 2017-12-19: 10 mL
  Filled 2017-12-19: qty 10

## 2017-12-19 MED ORDER — DEXAMETHASONE SODIUM PHOSPHATE 10 MG/ML IJ SOLN
INTRAMUSCULAR | Status: AC
  Filled 2017-12-19: qty 1

## 2017-12-19 MED ORDER — DEXAMETHASONE SODIUM PHOSPHATE 10 MG/ML IJ SOLN
10.0000 mg | Freq: Once | INTRAMUSCULAR | Status: AC
  Administered 2017-12-19: 10 mg via INTRAVENOUS

## 2017-12-19 NOTE — Progress Notes (Signed)
[name] [name] tolerated chemo tx well without complaints or incident. Port left accessed with saline lock then flushed per protocol for use tomorrow. VSS upon discharge. Pt discharged self ambulatory using cane in satisfactory condition accompanied by his wife

## 2017-12-19 NOTE — Patient Instructions (Signed)
[facility] Discharge Instructions for Patients Receiving Chemotherapy   Beginning January 23rd 2017 lab work for the [facility] will be done in the  Main lab at [facility] on 1st floor. If you have a lab appointment with the [facility] please come in thru the  Main Entrance and check in at the main information desk   Today you received the following chemotherapy agents VP-16. Follow-up as scheduled. Call clinic for any questions or concerns  To help prevent nausea and vomiting after your treatment, we encourage you to take your nausea medication   If you develop nausea and vomiting, or diarrhea that is not controlled by your medication, call the clinic.  The clinic phone number is [phone]. Office hours are Monday-Friday 8:30am-5:00pm.  BELOW ARE SYMPTOMS THAT SHOULD BE REPORTED IMMEDIATELY:  *FEVER GREATER THAN 101.0 F  *CHILLS WITH OR WITHOUT FEVER  NAUSEA AND VOMITING THAT IS NOT CONTROLLED WITH YOUR NAUSEA MEDICATION  *UNUSUAL SHORTNESS OF BREATH  *UNUSUAL BRUISING OR BLEEDING  TENDERNESS IN MOUTH AND THROAT WITH OR WITHOUT PRESENCE OF ULCERS  *URINARY PROBLEMS  *BOWEL PROBLEMS  UNUSUAL RASH Items with * indicate a potential emergency and should be followed up as soon as possible. If you have an emergency after office hours please contact your primary care physician or go to the nearest emergency department.  Please call the clinic during office hours if you have any questions or concerns.   You may also contact the Patient Navigator at [phone] should you have any questions or need assistance in obtaining follow up care.      Resources For Cancer Patients and their Caregivers ? American Cancer Society: Can assist with transportation, wigs, general needs, runs Look Good Feel Better.        954-329-1305 ? Cancer Care: Provides financial assistance, online support groups, medication/co-pay assistance.  1-800-813-HOPE  6301042618) ? [facility] [facility] Assists [place] cancer patients and their families through emotional , educational and financial support.  (859)737-5834 ? [place] DSS Where to apply for food stamps, Medicaid and utility assistance. 206-484-3912 ? [employer]: Transportation to medical appointments. (404)452-6685 ? Social Security Administration: May apply for disability if have a Stage IV cancer. (954)168-0066 980-663-2234 ? [employer], Disability and Transit Services: Assists with nutrition, care and transit needs. 434-798-4547

## 2017-12-20 ENCOUNTER — Inpatient Hospital Stay (HOSPITAL_COMMUNITY): Payer: Medicare HMO

## 2017-12-20 VITALS — BP 154/79 | HR 55 | Temp 97.7°F | Resp 18 | Wt 174.0 lb

## 2017-12-20 DIAGNOSIS — C3490 Malignant neoplasm of unspecified part of unspecified bronchus or lung: Secondary | ICD-10-CM

## 2017-12-20 DIAGNOSIS — Z5111 Encounter for antineoplastic chemotherapy: Secondary | ICD-10-CM | POA: Diagnosis not present

## 2017-12-20 DIAGNOSIS — Z7189 Other specified counseling: Secondary | ICD-10-CM

## 2017-12-20 MED ORDER — DEXAMETHASONE SODIUM PHOSPHATE 10 MG/ML IJ SOLN
INTRAMUSCULAR | Status: AC
  Filled 2017-12-20: qty 1

## 2017-12-20 MED ORDER — SODIUM CHLORIDE 0.9 % IV SOLN
Freq: Once | INTRAVENOUS | Status: AC
  Administered 2017-12-20: 12:00:00 via INTRAVENOUS

## 2017-12-20 MED ORDER — SODIUM CHLORIDE 0.9 % IV SOLN: 10.0000 mg | Freq: Once | INTRAVENOUS | Status: DC

## 2017-12-20 MED ORDER — SODIUM CHLORIDE 0.9 % IV SOLN
100.0000 mg/m2 | Freq: Once | INTRAVENOUS | Status: AC
  Administered 2017-12-20: 200 mg via INTRAVENOUS
  Filled 2017-12-20: qty 10

## 2017-12-20 MED ORDER — HEPARIN SOD (PORK) LOCK FLUSH 100 UNIT/ML IV SOLN
500.0000 [IU] | Freq: Once | INTRAVENOUS | Status: AC | PRN
  Administered 2017-12-20: 500 [IU]

## 2017-12-20 MED ORDER — DEXAMETHASONE SODIUM PHOSPHATE 10 MG/ML IJ SOLN
10.0000 mg | Freq: Once | INTRAMUSCULAR | Status: AC
  Administered 2017-12-20: 10 mg via INTRAVENOUS

## 2017-12-20 NOTE — Progress Notes (Signed)
Tolerated infusion w/o adverse reaction.  Alert, in no distress.  VSS.  Discharged ambulatory in c/o spouse.  

## 2017-12-22 ENCOUNTER — Other Ambulatory Visit: Payer: Self-pay

## 2017-12-22 ENCOUNTER — Inpatient Hospital Stay (HOSPITAL_COMMUNITY): Payer: Medicare HMO

## 2017-12-22 ENCOUNTER — Encounter (HOSPITAL_COMMUNITY): Payer: Self-pay

## 2017-12-22 VITALS — BP 117/60 | HR 72

## 2017-12-22 DIAGNOSIS — Z5111 Encounter for antineoplastic chemotherapy: Secondary | ICD-10-CM | POA: Diagnosis not present

## 2017-12-22 DIAGNOSIS — Z7189 Other specified counseling: Secondary | ICD-10-CM

## 2017-12-22 DIAGNOSIS — C3490 Malignant neoplasm of unspecified part of unspecified bronchus or lung: Secondary | ICD-10-CM

## 2017-12-22 MED ORDER — PEGFILGRASTIM-CBQV 6 MG/0.6ML ~~LOC~~ SOSY
6.0000 mg | PREFILLED_SYRINGE | Freq: Once | SUBCUTANEOUS | Status: AC
  Administered 2017-12-22: 6 mg via SUBCUTANEOUS

## 2017-12-22 MED ORDER — PEGFILGRASTIM-CBQV 6 MG/0.6ML ~~LOC~~ SOSY
PREFILLED_SYRINGE | SUBCUTANEOUS | Status: AC
  Filled 2017-12-22: qty 0.6

## 2017-12-22 NOTE — Progress Notes (Signed)
Kallie Edward Ricchio presents today for injection per MD orders. Udenyca administered SQ in left Abdomen. Administration without incident. Patient tolerated well.   Patient showed me his left arm where he had his chemo administered the first time. The vein in the arm looks better than previously - he showed me pictures on his phone (camera). The vein is hard however for about an inch and has a purple dot in the center. Vein in that same area (hard area) looks pink in color but not red and not like it did several days ago via pictures. No drainage from site. He has completed his keflex antibiotic. Dr. Raliegh Ip said to have patient monitor it and let us know if it gets larger. I notified patient and he said ok. I also instructed patient to take 1 Claritin or generic Claritin daily for about 7 days for his bone pain post Udenyca. He and his wife said okay to those instructions.

## 2018-01-03 ENCOUNTER — Other Ambulatory Visit (HOSPITAL_COMMUNITY): Payer: Self-pay | Admitting: Adult Health

## 2018-01-03 DIAGNOSIS — M79602 Pain in left arm: Secondary | ICD-10-CM

## 2018-01-03 DIAGNOSIS — L539 Erythematous condition, unspecified: Secondary | ICD-10-CM

## 2018-01-04 ENCOUNTER — Ambulatory Visit (HOSPITAL_COMMUNITY)
Admission: RE | Admit: 2018-01-04 | Discharge: 2018-01-04 | Disposition: A | Discharge status: Home / Self Care | Payer: Medicare HMO | Source: Ambulatory Visit | Attending: Adult Health | Admitting: Adult Health

## 2018-01-04 DIAGNOSIS — I808 Phlebitis and thrombophlebitis of other sites: Secondary | ICD-10-CM | POA: Diagnosis not present

## 2018-01-04 DIAGNOSIS — L539 Erythematous condition, unspecified: Secondary | ICD-10-CM | POA: Diagnosis not present

## 2018-01-04 DIAGNOSIS — M79602 Pain in left arm: Secondary | ICD-10-CM | POA: Diagnosis not present

## 2018-01-08 ENCOUNTER — Other Ambulatory Visit: Payer: Self-pay

## 2018-01-08 ENCOUNTER — Inpatient Hospital Stay (HOSPITAL_BASED_OUTPATIENT_CLINIC_OR_DEPARTMENT_OTHER): Payer: Medicare HMO | Admitting: Hematology

## 2018-01-08 ENCOUNTER — Encounter (HOSPITAL_COMMUNITY): Payer: Self-pay | Admitting: Hematology

## 2018-01-08 ENCOUNTER — Inpatient Hospital Stay (HOSPITAL_COMMUNITY): Payer: Medicare HMO

## 2018-01-08 ENCOUNTER — Other Ambulatory Visit (HOSPITAL_COMMUNITY): Payer: Medicare HMO

## 2018-01-08 VITALS — BP 144/63 | HR 71 | Temp 98.0°F | Resp 18 | Wt 175.2 lb

## 2018-01-08 DIAGNOSIS — L03114 Cellulitis of left upper limb: Secondary | ICD-10-CM | POA: Diagnosis not present

## 2018-01-08 DIAGNOSIS — R200 Anesthesia of skin: Secondary | ICD-10-CM

## 2018-01-08 DIAGNOSIS — Z79899 Other long term (current) drug therapy: Secondary | ICD-10-CM

## 2018-01-08 DIAGNOSIS — C3490 Malignant neoplasm of unspecified part of unspecified bronchus or lung: Secondary | ICD-10-CM

## 2018-01-08 DIAGNOSIS — I80201 Phlebitis and thrombophlebitis of unspecified deep vessels of right lower extremity: Secondary | ICD-10-CM

## 2018-01-08 DIAGNOSIS — Z7189 Other specified counseling: Secondary | ICD-10-CM

## 2018-01-08 DIAGNOSIS — Z5111 Encounter for antineoplastic chemotherapy: Secondary | ICD-10-CM | POA: Diagnosis not present

## 2018-01-08 LAB — CBC WITH DIFFERENTIAL/PLATELET
Basophils Absolute: 0 10*3/uL (ref 0.0–0.1)
Basophils Relative: 1 %
Eosinophils Absolute: 0.1 10*3/uL (ref 0.0–0.7)
Eosinophils Relative: 2 %
HCT: 38.1 % — ABNORMAL LOW (ref 39.0–52.0)
HEMOGLOBIN: 12.2 g/dL — AB (ref 13.0–17.0)
LYMPHS PCT: 23 %
Lymphs Abs: 1.7 10*3/uL (ref 0.7–4.0)
MCH: 29.4 pg (ref 26.0–34.0)
MCHC: 32 g/dL (ref 30.0–36.0)
MCV: 91.8 fL (ref 78.0–100.0)
Monocytes Absolute: 0.8 10*3/uL (ref 0.1–1.0)
Monocytes Relative: 11 %
NEUTROS ABS: 4.6 10*3/uL (ref 1.7–7.7)
NEUTROS PCT: 63 %
Platelets: 224 10*3/uL (ref 150–400)
RBC: 4.15 MIL/uL — AB (ref 4.22–5.81)
RDW: 15.5 % (ref 11.5–15.5)
WBC: 7.4 10*3/uL (ref 4.0–10.5)

## 2018-01-08 LAB — COMPREHENSIVE METABOLIC PANEL
ALK PHOS: 108 U/L (ref 38–126)
ALT: 16 U/L — AB (ref 17–63)
AST: 16 U/L (ref 15–41)
Albumin: 3.8 g/dL (ref 3.5–5.0)
Anion gap: 8 (ref 5–15)
BUN: 17 mg/dL (ref 6–20)
CALCIUM: 9.3 mg/dL (ref 8.9–10.3)
CO2: 25 mmol/L (ref 22–32)
CREATININE: 0.91 mg/dL (ref 0.61–1.24)
Chloride: 105 mmol/L (ref 101–111)
GFR calc Af Amer: >60 mL/min (ref >60)
Glucose, Bld: 101 mg/dL — ABNORMAL HIGH (ref 65–99)
Potassium: 4.8 mmol/L (ref 3.5–5.1)
SODIUM: 138 mmol/L (ref 135–145)
Total Bilirubin: 0.6 mg/dL (ref 0.3–1.2)
Total Protein: 7 g/dL (ref 6.5–8.1)

## 2018-01-08 LAB — MAGNESIUM: MAGNESIUM: 1.8 mg/dL (ref 1.7–2.4)

## 2018-01-08 LAB — TSH: TSH: 1.04 u[IU]/mL (ref 0.350–4.500)

## 2018-01-08 MED ORDER — SODIUM CHLORIDE 0.9 % IV SOLN
1200.0000 mg | Freq: Once | INTRAVENOUS | Status: AC
  Administered 2018-01-08: 1200 mg via INTRAVENOUS
  Filled 2018-01-08: qty 20

## 2018-01-08 MED ORDER — DEXAMETHASONE SODIUM PHOSPHATE 10 MG/ML IJ SOLN
INTRAMUSCULAR | Status: AC
  Filled 2018-01-08: qty 1

## 2018-01-08 MED ORDER — PALONOSETRON HCL INJECTION 0.25 MG/5ML
0.2500 mg | Freq: Once | INTRAVENOUS | Status: AC
  Administered 2018-01-08: 0.25 mg via INTRAVENOUS

## 2018-01-08 MED ORDER — SODIUM CHLORIDE 0.9 % IV SOLN
627.0000 mg | Freq: Once | INTRAVENOUS | Status: AC
  Administered 2018-01-08: 630 mg via INTRAVENOUS
  Filled 2018-01-08: qty 63

## 2018-01-08 MED ORDER — SODIUM CHLORIDE 0.9 % IV SOLN
Freq: Once | INTRAVENOUS | Status: AC
  Administered 2018-01-08: 10:00:00 via INTRAVENOUS

## 2018-01-08 MED ORDER — SODIUM CHLORIDE 0.9 % IV SOLN: 10.0000 mg | Freq: Once | INTRAVENOUS | Status: DC

## 2018-01-08 MED ORDER — HEPARIN SOD (PORK) LOCK FLUSH 100 UNIT/ML IV SOLN
500.0000 [IU] | Freq: Once | INTRAVENOUS | Status: AC | PRN
  Administered 2018-01-08: 500 [IU]

## 2018-01-08 MED ORDER — ACETAMINOPHEN 325 MG PO TABS
ORAL_TABLET | ORAL | Status: AC
  Filled 2018-01-08: qty 1

## 2018-01-08 MED ORDER — DEXAMETHASONE SODIUM PHOSPHATE 10 MG/ML IJ SOLN
10.0000 mg | Freq: Once | INTRAMUSCULAR | Status: AC
  Administered 2018-01-08: 10 mg via INTRAVENOUS

## 2018-01-08 MED ORDER — PALONOSETRON HCL INJECTION 0.25 MG/5ML
INTRAVENOUS | Status: AC
  Filled 2018-01-08: qty 5

## 2018-01-08 MED ORDER — SODIUM CHLORIDE 0.9% FLUSH
10.0000 mL | INTRAVENOUS | Status: DC | PRN
  Administered 2018-01-08: 10 mL
  Filled 2018-01-08: qty 10

## 2018-01-08 MED ORDER — SODIUM CHLORIDE 0.9 % IV SOLN
100.0000 mg/m2 | Freq: Once | INTRAVENOUS | Status: AC
  Administered 2018-01-08: 200 mg via INTRAVENOUS
  Filled 2018-01-08: qty 10

## 2018-01-08 MED ORDER — DICLOFENAC SODIUM 1 % TD GEL: 2.0000 g | Freq: Four times a day (QID) | TRANSDERMAL | 1 refills | Status: AC

## 2018-01-08 NOTE — Progress Notes (Signed)
Patient to treatment room for oncology follow up and chemotherapy.    Patient complains of left side abdominal pain after last two Neulasta shots.  Also, stated changes in vision and asking for left arm clot to be removed.  Information given to Dr. Delton Coombes for oncology follow up visit.  Good appetite with no complaints of constipation or diarrhea.  Fatigued with no worsening.  Patient tolerated chemotherapy with no complaints voiced.  Port site clean and dry with no bruising or swelling noted at site.  Band aid applied.  VSS with discharge and left ambulatory with no s/s of distress noted.

## 2018-01-08 NOTE — Progress Notes (Signed)
[place], [place] [address]   CLINIC:  Medical Oncology/Hematology  PCP:  [facility], [place] [address] [phone]   REASON FOR VISIT:  Follow-up for small cell lung cancer.  CURRENT THERAPY: Chemotherapy with carboplatin, etoposide and Atezolizumab.   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 male returns for routine follow-up and consideration for next cycle of chemotherapy.   He is due for cycle 3 of chemotherapy.  After his cycle 2 he did not experience any nausea, vomiting, diarrhea.  He takes MiraLAX for his constipation.  He had mild generalized itching but no rash.  He complained of blurring of vision.  His right hip pain and mid chest pain have completely gone.  He reports that he has felt the best in the last 1 year at this time.  He went back to work 1 week after his cycle 2.  He reported that his left forearm phlebitis area flared up to 10 days after his last cycle of chemotherapy and lasted about 1 week.  At the same time he also reported slight pain in the left lower quadrant area elevated his first Neulasta injection was given.  REVIEW OF SYSTEMS:  Review of Systems  Constitutional: Negative.   HENT:  Negative.   Eyes: Positive for eye problems.  Gastrointestinal: Positive for constipation.  Endocrine: Negative.   Genitourinary: Negative.    Musculoskeletal: Negative.   Skin: Positive for itching.  Neurological: Negative.   Hematological: Negative.   Psychiatric/Behavioral: Negative.      PAST MEDICAL/SURGICAL HISTORY:  Past Medical History:  Diagnosis Date  . Anxiety   . Asthma   . COPD (chronic obstructive pulmonary disease) ([facility])   . DDD  (degenerative disc disease)   . Diverticulitis   . GERD (gastroesophageal reflux disease)   . Headache   . Hepatitis    in the late 70's   . HTN (hypertension)   . Mediastinal adenopathy   . Seizures ([facility])    as of 2019 none for 20 years  . Sleep apnea    does not  use cpap  . Stroke [facility])    TIA - Feb. 2018   Past Surgical History:  Procedure Laterality Date  . CERVICAL DISC SURGERY     X4  . CHOLECYSTECTOMY    . COLONOSCOPY  08/24/2011   Procedure: COLONOSCOPY;  Surgeon: Daneil Dolin, MD;  Location[facility];  Service: Endoscopy;  Laterality: N/A;  7:30AM  . ELBOW SURGERY     right  . PORTACATH PLACEMENT Right 12/01/2017   Procedure: INSERTION PORT-A-CATH;  Surgeon: Aviva Signs, MD;  Location: [facility] ORS;  Service: General;  Laterality: Right;  Marland Kitchen VIDEO BRONCHOSCOPY WITH ENDOBRONCHIAL ULTRASOUND N/A 10/31/2017   Procedure: VIDEO BRONCHOSCOPY WITH ENDOBRONCHIAL ULTRASOUND;  Surgeon: Ivin Poot, MD;  Location: [facility];  Service: Thoracic;  Laterality: N/A;  . VIDEO BRONCHOSCOPY WITH ENDOBRONCHIAL ULTRASOUND N/A 11/22/2017   Procedure: VIDEO BRONCHOSCOPY WITH ENDOBRONCHIAL ULTRASOUND;  Surgeon: Melrose Nakayama, MD;  Location: [facility];  Service: Thoracic;  Laterality: N/A;     SOCIAL HISTORY:  Social History   Socioeconomic History  . Marital status: Married    Spouse name: Not on file  . Number  of children: Not on file  . Years of education: Not on file  . Highest education level: Not on file  Occupational History  . Occupation: disability  Social Needs  . Financial resource strain: Not on file  . Food insecurity:    Worry: Not on file    Inability: Not on file  . Transportation needs:    Medical: Not on file    Non-medical: Not on file  Tobacco Use  . Smoking status: Former Smoker    Packs/day: 1.50    Years: 30.00    Pack years: 45.00    Types: Cigarettes    Last attempt to quit: 10/13/2017    Years since quitting: 0.2  . Smokeless tobacco: Never  Used  Substance and Sexual Activity  . Alcohol use: No  . Drug use: No  . Sexual activity: Yes    Birth control/protection: None  Lifestyle  . Physical activity:    Days per week: Not on file    Minutes per session: Not on file  . Stress: Not on file  Relationships  . Social connections:    Talks on phone: Not on file    Gets together: Not on file    Attends religious service: Not on file    Active member of club or organization: Not on file    Attends meetings of clubs or organizations: Not on file    Relationship status: Not on file  . Intimate partner violence:    Fear of current or ex partner: Not on file    Emotionally abused: Not on file    Physically abused: Not on file    Forced sexual activity: Not on file  Other Topics Concern  . Not on file  Social History Narrative  . Not on file    FAMILY HISTORY:  Family History  Problem Relation Age of Onset  . Hypertension Father   . Heart attack Father   . Kidney failure Mother   . Diabetes Mother   . Hypertension Mother   . Colon cancer Neg Hx   . Anesthesia problems Neg Hx   . Hypotension Neg Hx   . Malignant hyperthermia Neg Hx   . Pseudochol deficiency Neg Hx     CURRENT MEDICATIONS:  Outpatient Encounter Medications as of 01/08/2018  Medication Sig Note  . albuterol (PROVENTIL HFA;VENTOLIN HFA) 108 (90 BASE) MCG/ACT inhaler Inhale 2 puffs into the lungs every 6 (six) hours as needed for wheezing or shortness of breath.    Marland Kitchen albuterol (PROVENTIL) (2.5 MG/3ML) 0.083% nebulizer solution Take 2.5 mg by nebulization every 6 (six) hours as needed for wheezing or shortness of breath.    . alprazolam (XANAX) 2 MG tablet Take 2 mg by mouth every 6 (six) hours as needed for sleep or anxiety.    Marland Kitchen amLODipine (NORVASC) 10 MG tablet Take 10 mg by mouth daily.   Huey Bienenstock (TECENTRIQ IV) Inject into the vein. Every 21 days   . CARBOPLATIN IV Inject into the vein. Every 21 days   . cyclobenzaprine (FLEXERIL) 10 MG  tablet Take 10 mg by mouth daily as needed for muscle spasms.    Marland Kitchen docusate sodium (COLACE) 100 MG capsule Take 1 capsule (100 mg total) by mouth every 12 (twelve) hours.   . ETOPOSIDE IV Inject into the vein. Days 1,2,3 every 21 days   . fluticasone (FLONASE) 50 MCG/ACT nasal spray Place 2 sprays into both nostrils daily as needed for allergies.    . Glucosamine-Chondroit-Vit  C-Mn (GLUCOSAMINE 1500 COMPLEX) CAPS Take 1 capsule by mouth.   . levETIRAcetam (KEPPRA) 500 MG tablet Take 500 mg by mouth daily.    Marland Kitchen lidocaine-prilocaine (EMLA) cream Apply 1 application topically as needed. Apply to portacath site as directed   . lisinopril (PRINIVIL,ZESTRIL) 20 MG tablet Take 20 mg by mouth 2 (two) times daily.   . meclizine (ANTIVERT) 12.5 MG tablet Take 1 tablet (12.5 mg total) by mouth 3 (three) times daily as needed for dizziness.   . MULTIPLE VITAMINS PO Take 1 tablet by mouth.   . naproxen (NAPROSYN) 500 MG tablet Take 500 mg by mouth 2 (two) times daily with a meal.     . Omega-3 Fatty Acids (FISH OIL) 1000 MG CAPS Take 1,000 mg by mouth 2 (two) times daily.  10/26/2017: Stopped for procedure  . ondansetron (ZOFRAN) 8 MG tablet Take 1 tablet (8 mg total) by mouth 2 (two) times daily as needed for refractory nausea / vomiting. Start on day 3 after carboplatin chemo.   Marland Kitchen oxyCODONE (ROXICODONE) 15 MG immediate release tablet Take 15 mg by mouth 4 (four) times daily as needed for pain.    . polyethylene glycol (MIRALAX / GLYCOLAX) packet Take 17 g by mouth daily.   . prochlorperazine (COMPAZINE) 10 MG tablet Take 1 tablet (10 mg total) by mouth every 6 (six) hours as needed (Nausea or vomiting).   . tamsulosin (FLOMAX) 0.4 MG CAPS capsule Take 0.4 mg by mouth daily.    . temazepam (RESTORIL) 30 MG capsule Take 1 capsule (30 mg total) by mouth at bedtime as needed for sleep.   . TURMERIC PO Take 4 tablets by mouth.   . diclofenac sodium (VOLTAREN) 1 % GEL Apply 2 g topically 4 (four) times daily. Apply  to left forearm phlebitic area    No facility-administered encounter medications on file as of 01/08/2018.     ALLERGIES:  No Known Allergies   PHYSICAL EXAM:  ECOG Performance status: 1 I have reviewed his vitals. Physical Exam   LABORATORY DATA:  I have reviewed the labs as listed.  CBC    Component Value Date/Time   WBC 7.4 01/08/2018 0820   RBC 4.15 (L) 01/08/2018 0820   HGB 12.2 (L) 01/08/2018 0820   HCT 38.1 (L) 01/08/2018 0820   PLT 224 01/08/2018 0820   MCV 91.8 01/08/2018 0820   MCH 29.4 01/08/2018 0820   MCHC 32.0 01/08/2018 0820   RDW 15.5 01/08/2018 0820   LYMPHSABS 1.7 01/08/2018 0820   MONOABS 0.8 01/08/2018 0820   EOSABS 0.1 01/08/2018 0820   BASOSABS 0.0 01/08/2018 0820   CMP Latest Ref Rng & Units 01/08/2018 12/18/2017 11/27/2017  Glucose 65 - 99 mg/dL 101(H) 112(H) 153(H)  BUN 6 - 20 mg/dL 17 12 17   Creatinine 0.61 - 1.24 mg/dL 0.91 0.90 0.90  Sodium 135 - 145 mmol/L 138 138 131(L)  Potassium 3.5 - 5.1 mmol/L 4.8 4.0 4.5  Chloride 101 - 111 mmol/L 105 104 97(L)  CO2 22 - 32 mmol/L 25 25 21(L)  Calcium 8.9 - 10.3 mg/dL 9.3 9.1 8.9  Total Protein 6.5 - 8.1 g/dL 7.0 6.9 -  Total Bilirubin 0.3 - 1.2 mg/dL 0.6 0.9 -  Alkaline Phos 38 - 126 U/L 108 115 -  AST 15 - 41 U/L 16 15 -  ALT 17 - 63 U/L 16(L) 14(L) -         ASSESSMENT & PLAN:   Extensive stage primary small cell carcinoma of  lung ([place]) 1.  Extensive stage small cell lung cancer: -Cycle 1 of carboplatin, etoposide and Atezolizumab on 11/27/2017, tolerated well except slight nausea couple of times. -Tolerated his cycle 2 very well.  He had a left forearm phlebitis flareup 10 days after chemotherapy.  He reports that he felt better overall in the last 1 year.  He went to work 1 week after chemotherapy.  His right hip pain and mid chest pain has improved.  He uses MiraLAX as needed for his constipation.  No immunotherapy related side effects.  I have reviewed his blood counts.  He will proceed  with cycle 3 today.  I plan to repeat CT scan of the chest, abdomen and pelvis after this cycle to evaluate response.   2.  Left forearm cellulitis: He has developed cellulitis 2 weeks after receiving first cycle of chemotherapy.  He finished 1 week course of Keflex.  However there is still thickening about an inch in the left forearm area.  It flared up 10 days after last cycle and lasted about 1 week.  He is using topical CBD oil.  We have done a Doppler on his left upper extremity.  This showed superficial thrombophlebitis.  I have recommended diclofenac 1% gel 3-4 times a day.  If this does not improve, we will make a referral to vascular surgeon.  I do not feel strongly that any anticoagulation is necessary.  3.  Numbness: He had numbness in his left arm and left leg for many years since his surgery in the neck.  He has noticed slight worsening of the numbness on and off since his first cycle of chemotherapy.  He did not experience any problems with numbness after cycle 2.  However he complained of blurring of vision.  I have asked him to see his optometrist.      Orders placed this encounter:  Orders Placed This Encounter  Procedures  . CT Chest W Contrast  . CT Abdomen Pelvis W Contrast  . CBC with Differential  . Comprehensive metabolic panel  . Lactate dehydrogenase      Derek Jack, MD [place] [phone]

## 2018-01-08 NOTE — Patient Instructions (Signed)
[facility] at [facility] Discharge Instructions  Today you saw Dr. K.   Thank you for choosing [facility] at [facility] to provide your oncology and hematology care.  To afford each patient quality time with our provider, please arrive at least 15 minutes before your scheduled appointment time.   If you have a lab appointment with the Cancer Center please come in thru the  Main Entrance and check in at the main information desk  You need to re-schedule your appointment should you arrive 10 or more minutes late.  We strive to give you quality time with our providers, and arriving late affects you and other patients whose appointments are after yours.  Also, if you no show three or more times for appointments you may be dismissed from the clinic at the providers discretion.     Again, thank you for choosing [facility].  Our hope is that these requests will decrease the amount of time that you wait before being seen by our physicians.       _____________________________________________________________  Should you have questions after your visit to [facility], please contact our office at [phone] between the hours of 8:30 a.m. and 4:30 p.m.  Voicemails left after 4:30 p.m. will not be returned until the following business day.  For prescription refill requests, have your pharmacy contact our office.       Resources For Cancer Patients and their Caregivers ? American Cancer Society: Can assist with transportation, wigs, general needs, runs Look Good Feel Better.        1-888-227-6333 ? Cancer Care: Provides financial assistance, online support groups, medication/co-pay assistance.  1-800-813-HOPE (4673) ? [facility] Assists [place] cancer patients and their families through emotional , educational and financial support.  336-427-4357 ? [place] DSS Where to apply for food  stamps, Medicaid and utility assistance. 336-342-1394 ? RCATS: Transportation to medical appointments. 336-347-2287 ? Social Security Administration: May apply for disability if have a Stage IV cancer. 336-342-7796 1-800-772-1213 ? [place] Aging, Disability and Transit Services: Assists with nutrition, care and transit needs. 336-349-2343  Cancer Center Support Programs:   > Cancer Support Group  2nd Tuesday of the month 1pm-2pm, Journey Room   > Creative Journey  3rd Tuesday of the month 1130am-1pm, Journey Room    

## 2018-01-08 NOTE — Assessment & Plan Note (Signed)
1.  Extensive stage small cell lung cancer: -Cycle 1 of carboplatin, etoposide and Atezolizumab on 11/27/2017, tolerated well except slight nausea couple of times. -Tolerated his cycle 2 very well.  He had a left forearm phlebitis flareup 10 days after chemotherapy.  He reports that he felt better overall in the last 1 year.  He went to work 1 week after chemotherapy.  His right hip pain and mid chest pain has improved.  He uses MiraLAX as needed for his constipation.  No immunotherapy related side effects.  I have reviewed his blood counts.  He will proceed with cycle 3 today.  I plan to repeat CT scan of the chest, abdomen and pelvis after this cycle to evaluate response.   2.  Left forearm cellulitis: He has developed cellulitis 2 weeks after receiving first cycle of chemotherapy.  He finished 1 week course of Keflex.  However there is still thickening about an inch in the left forearm area.  It flared up 10 days after last cycle and lasted about 1 week.  He is using topical CBD oil.  We have done a Doppler on his left upper extremity.  This showed superficial thrombophlebitis.  I have recommended diclofenac 1% gel 3-4 times a day.  If this does not improve, we will make a referral to vascular surgeon.  I do not feel strongly that any anticoagulation is necessary.  3.  Numbness: He had numbness in his left arm and left leg for many years since his surgery in the neck.  He has noticed slight worsening of the numbness on and off since his first cycle of chemotherapy.  He did not experience any problems with numbness after cycle 2.  However he complained of blurring of vision.  I have asked him to see his optometrist.

## 2018-01-08 NOTE — Patient Instructions (Signed)
[facility] Discharge Instructions for Patients Receiving Chemotherapy  Today you received the following chemotherapy agents tecentriq, etoposide, and carboplatin.    If you develop nausea and vomiting that is not controlled by your nausea medication, call the clinic.   BELOW ARE SYMPTOMS THAT SHOULD BE REPORTED IMMEDIATELY:  *FEVER GREATER THAN 100.5 F  *CHILLS WITH OR WITHOUT FEVER  NAUSEA AND VOMITING THAT IS NOT CONTROLLED WITH YOUR NAUSEA MEDICATION  *UNUSUAL SHORTNESS OF BREATH  *UNUSUAL BRUISING OR BLEEDING  TENDERNESS IN MOUTH AND THROAT WITH OR WITHOUT PRESENCE OF ULCERS  *URINARY PROBLEMS  *BOWEL PROBLEMS  UNUSUAL RASH Items with * indicate a potential emergency and should be followed up as soon as possible.  Feel free to call the clinic should you have any questions or concerns. The clinic phone number is [phone].  Please show the [facility] at check-in to the Emergency Department and triage nurse.

## 2018-01-09 ENCOUNTER — Encounter (HOSPITAL_COMMUNITY): Payer: Self-pay

## 2018-01-09 ENCOUNTER — Inpatient Hospital Stay (HOSPITAL_COMMUNITY): Payer: Medicare HMO

## 2018-01-09 ENCOUNTER — Other Ambulatory Visit (HOSPITAL_COMMUNITY): Payer: Self-pay | Admitting: Adult Health

## 2018-01-09 VITALS — BP 159/74 | HR 66 | Temp 98.6°F | Resp 18 | Wt 177.0 lb

## 2018-01-09 DIAGNOSIS — C3490 Malignant neoplasm of unspecified part of unspecified bronchus or lung: Secondary | ICD-10-CM

## 2018-01-09 DIAGNOSIS — G4700 Insomnia, unspecified: Secondary | ICD-10-CM

## 2018-01-09 DIAGNOSIS — Z5111 Encounter for antineoplastic chemotherapy: Secondary | ICD-10-CM | POA: Diagnosis not present

## 2018-01-09 DIAGNOSIS — G4733 Obstructive sleep apnea (adult) (pediatric): Secondary | ICD-10-CM | POA: Diagnosis not present

## 2018-01-09 DIAGNOSIS — Z7189 Other specified counseling: Secondary | ICD-10-CM

## 2018-01-09 MED ORDER — HEPARIN SOD (PORK) LOCK FLUSH 100 UNIT/ML IV SOLN
500.0000 [IU] | Freq: Once | INTRAVENOUS | Status: AC | PRN
  Administered 2018-01-09: 500 [IU]

## 2018-01-09 MED ORDER — DEXAMETHASONE SODIUM PHOSPHATE 10 MG/ML IJ SOLN
10.0000 mg | Freq: Once | INTRAMUSCULAR | Status: AC
  Administered 2018-01-09: 10 mg via INTRAVENOUS

## 2018-01-09 MED ORDER — ETOPOSIDE CHEMO INJECTION 1 GM/50ML
100.0000 mg/m2 | Freq: Once | INTRAVENOUS | Status: AC
  Administered 2018-01-09: 200 mg via INTRAVENOUS
  Filled 2018-01-09: qty 10

## 2018-01-09 MED ORDER — DEXAMETHASONE SODIUM PHOSPHATE 10 MG/ML IJ SOLN
INTRAMUSCULAR | Status: AC
  Filled 2018-01-09: qty 1

## 2018-01-09 MED ORDER — SODIUM CHLORIDE 0.9 % IV SOLN
Freq: Once | INTRAVENOUS | Status: AC
  Administered 2018-01-09: 10:00:00 via INTRAVENOUS

## 2018-01-09 MED ORDER — SODIUM CHLORIDE 0.9 % IV SOLN: 10.0000 mg | Freq: Once | INTRAVENOUS | Status: DC

## 2018-01-09 MED ORDER — SODIUM CHLORIDE 0.9% FLUSH
10.0000 mL | INTRAVENOUS | Status: DC | PRN
  Administered 2018-01-09: 10 mL
  Filled 2018-01-09: qty 10

## 2018-01-09 NOTE — Patient Instructions (Signed)
[facility] Discharge Instructions for Patients Receiving Chemotherapy  Today you received the following chemotherapy agents etoposide.    If you develop nausea and vomiting that is not controlled by your nausea medication, call the clinic.   BELOW ARE SYMPTOMS THAT SHOULD BE REPORTED IMMEDIATELY:  *FEVER GREATER THAN 100.5 F  *CHILLS WITH OR WITHOUT FEVER  NAUSEA AND VOMITING THAT IS NOT CONTROLLED WITH YOUR NAUSEA MEDICATION  *UNUSUAL SHORTNESS OF BREATH  *UNUSUAL BRUISING OR BLEEDING  TENDERNESS IN MOUTH AND THROAT WITH OR WITHOUT PRESENCE OF ULCERS  *URINARY PROBLEMS  *BOWEL PROBLEMS  UNUSUAL RASH Items with * indicate a potential emergency and should be followed up as soon as possible.  Feel free to call the clinic should you have any questions or concerns. The clinic phone number is [phone].  Please show the [facility] at check-in to the Emergency Department and triage nurse.

## 2018-01-09 NOTE — Progress Notes (Signed)
To treatment area for day 2 of chemotherapy.  No complaints of left sided abdominal pain.  No changes in fatigue, appetite, or left arm.  Family at side.   Patient tolerated chemotherapy with no complaints voiced.  Port site left accessed.  No bruising or swelling noted at site.  No complaints of pain with flush.  Dressing clean, dry, and intact.  VSs with discharge and left ambulatory with wife with no s/s of distress noted.

## 2018-01-10 ENCOUNTER — Inpatient Hospital Stay (HOSPITAL_COMMUNITY): Payer: Medicare HMO | Attending: Hematology

## 2018-01-10 VITALS — BP 138/77 | HR 59 | Temp 97.9°F | Resp 18

## 2018-01-10 DIAGNOSIS — C3490 Malignant neoplasm of unspecified part of unspecified bronchus or lung: Secondary | ICD-10-CM | POA: Diagnosis not present

## 2018-01-10 DIAGNOSIS — Z79899 Other long term (current) drug therapy: Secondary | ICD-10-CM | POA: Diagnosis not present

## 2018-01-10 DIAGNOSIS — Z5111 Encounter for antineoplastic chemotherapy: Secondary | ICD-10-CM | POA: Insufficient documentation

## 2018-01-10 DIAGNOSIS — Z7189 Other specified counseling: Secondary | ICD-10-CM

## 2018-01-10 DIAGNOSIS — C7951 Secondary malignant neoplasm of bone: Secondary | ICD-10-CM | POA: Diagnosis not present

## 2018-01-10 MED ORDER — SODIUM CHLORIDE 0.9 % IV SOLN
100.0000 mg/m2 | Freq: Once | INTRAVENOUS | Status: AC
  Administered 2018-01-10: 200 mg via INTRAVENOUS
  Filled 2018-01-10: qty 10

## 2018-01-10 MED ORDER — DEXAMETHASONE SODIUM PHOSPHATE 10 MG/ML IJ SOLN
INTRAMUSCULAR | Status: AC
  Filled 2018-01-10: qty 1

## 2018-01-10 MED ORDER — SODIUM CHLORIDE 0.9 % IV SOLN: 10.0000 mg | Freq: Once | INTRAVENOUS | Status: DC

## 2018-01-10 MED ORDER — SODIUM CHLORIDE 0.9 % IV SOLN
Freq: Once | INTRAVENOUS | Status: AC
  Administered 2018-01-10: 10:00:00 via INTRAVENOUS

## 2018-01-10 MED ORDER — DEXAMETHASONE SODIUM PHOSPHATE 10 MG/ML IJ SOLN
10.0000 mg | Freq: Once | INTRAMUSCULAR | Status: AC
  Administered 2018-01-10: 10 mg via INTRAVENOUS

## 2018-01-10 MED ORDER — HEPARIN SOD (PORK) LOCK FLUSH 100 UNIT/ML IV SOLN
500.0000 [IU] | Freq: Once | INTRAVENOUS | Status: AC
  Administered 2018-01-10: 500 [IU] via INTRAVENOUS

## 2018-01-10 MED ORDER — SODIUM CHLORIDE 0.9% FLUSH
10.0000 mL | INTRAVENOUS | Status: DC | PRN
  Administered 2018-01-10: 10 mL
  Filled 2018-01-10: qty 10

## 2018-01-10 MED ORDER — HEPARIN SOD (PORK) LOCK FLUSH 100 UNIT/ML IV SOLN
INTRAVENOUS | Status: AC
Start: 2018-01-10 — End: ?
  Filled 2018-01-10: qty 5

## 2018-01-10 NOTE — Progress Notes (Signed)
Treatment given per orders. Patient tolerated it well without problems. Vitals stable and discharged home from clinic ambulatory. Follow up as scheduled.  

## 2018-01-10 NOTE — Patient Instructions (Signed)
[facility] Discharge Instructions for Patients Receiving Chemotherapy   Beginning January 23rd 2017 lab work for the Cancer Center will be done in the  Main lab at  on 1st floor. If you have a lab appointment with the Cancer Center please come in thru the  Main Entrance and check in at the main information desk   Today you received the following chemotherapy agents   To help prevent nausea and vomiting after your treatment, we encourage you to take your nausea medication     If you develop nausea and vomiting, or diarrhea that is not controlled by your medication, call the clinic.  The clinic phone number is [phone]. Office hours are Monday-Friday 8:30am-5:00pm.  BELOW ARE SYMPTOMS THAT SHOULD BE REPORTED IMMEDIATELY:  *FEVER GREATER THAN 101.0 F  *CHILLS WITH OR WITHOUT FEVER  NAUSEA AND VOMITING THAT IS NOT CONTROLLED WITH YOUR NAUSEA MEDICATION  *UNUSUAL SHORTNESS OF BREATH  *UNUSUAL BRUISING OR BLEEDING  TENDERNESS IN MOUTH AND THROAT WITH OR WITHOUT PRESENCE OF ULCERS  *URINARY PROBLEMS  *BOWEL PROBLEMS  UNUSUAL RASH Items with * indicate a potential emergency and should be followed up as soon as possible. If you have an emergency after office hours please contact your primary care physician or go to the nearest emergency department.  Please call the clinic during office hours if you have any questions or concerns.   You may also contact the Patient Navigator at [phone] should you have any questions or need assistance in obtaining follow up care.      Resources For Cancer Patients and their Caregivers ? American Cancer Society: Can assist with transportation, wigs, general needs, runs Look Good Feel Better.        1-888-227-6333 ? Cancer Care: Provides financial assistance, online support groups, medication/co-pay assistance.  1-800-813-HOPE (4673) ? [facility] Assists [place] cancer  patients and their families through emotional , educational and financial support.  336-427-4357 ? [place] DSS Where to apply for food stamps, Medicaid and utility assistance. 336-342-1394 ? RCATS: Transportation to medical appointments. 336-347-2287 ? Social Security Administration: May apply for disability if have a Stage IV cancer. 336-342-7796 1-800-772-1213 ? [place] Aging, Disability and Transit Services: Assists with nutrition, care and transit needs. 336-349-2343         

## 2018-01-11 ENCOUNTER — Telehealth: Payer: Self-pay | Admitting: Cardiovascular Disease

## 2018-01-11 NOTE — Telephone Encounter (Signed)
New Message    Patient is calling in reference to a message that he received about his CT results. He does not think it was for him. Please call to discuss.

## 2018-01-11 NOTE — Telephone Encounter (Signed)
noted 

## 2018-01-12 ENCOUNTER — Inpatient Hospital Stay (HOSPITAL_COMMUNITY): Payer: Medicare HMO

## 2018-01-12 ENCOUNTER — Encounter (HOSPITAL_COMMUNITY): Payer: Self-pay

## 2018-01-12 VITALS — BP 125/64 | HR 70 | Temp 98.4°F | Resp 18

## 2018-01-12 DIAGNOSIS — C3490 Malignant neoplasm of unspecified part of unspecified bronchus or lung: Secondary | ICD-10-CM

## 2018-01-12 DIAGNOSIS — Z7189 Other specified counseling: Secondary | ICD-10-CM

## 2018-01-12 DIAGNOSIS — Z5111 Encounter for antineoplastic chemotherapy: Secondary | ICD-10-CM | POA: Diagnosis not present

## 2018-01-12 MED ORDER — PEGFILGRASTIM-CBQV 6 MG/0.6ML ~~LOC~~ SOSY
PREFILLED_SYRINGE | SUBCUTANEOUS | Status: AC
  Filled 2018-01-12: qty 0.6

## 2018-01-12 MED ORDER — PEGFILGRASTIM-CBQV 6 MG/0.6ML ~~LOC~~ SOSY
6.0000 mg | PREFILLED_SYRINGE | Freq: Once | SUBCUTANEOUS | Status: AC
  Administered 2018-01-12: 6 mg via SUBCUTANEOUS

## 2018-01-12 MED ORDER — FULVESTRANT 250 MG/5ML IM SOLN
INTRAMUSCULAR | Status: AC
  Filled 2018-01-12: qty 5

## 2018-01-12 NOTE — Patient Instructions (Signed)
[place] [facility] at [facility]  Discharge Instructions:  You received a udenyca shot today.  _______________________________________________________________  Thank you for choosing [facility] at [facility] to provide your oncology and hematology care.  To afford each patient quality time with our providers, please arrive at least 15 minutes before your scheduled appointment.  You need to re-schedule your appointment if you arrive 10 or more minutes late.  We strive to give you quality time with our providers, and arriving late affects you and other patients whose appointments are after yours.  Also, if you no show three or more times for appointments you may be dismissed from the clinic.  Again, thank you for choosing [facility] [place] at [facility] hope is that these requests will allow you access to exceptional care and in a timely manner. _______________________________________________________________  If you have questions after your visit, please contact our office at [phone] between the hours of 8:30 a.m. and 5:00 p.m. Voicemails left after 4:30 p.m. will not be returned until the following business day. _______________________________________________________________  For prescription refill requests, have your pharmacy contact our office. _______________________________________________________________  Recommendations made by the consultant and any test results will be sent to your referring physician. _______________________________________________________________

## 2018-01-12 NOTE — Progress Notes (Signed)
Patient tolerated udenyca shot with no complaints of pain with injection.  Site clean and dry with no bruising or swelling noted at site.  Band aid applied.  VSS with discharge and left ambulatory with no s/s of distress noted.

## 2018-01-15 DIAGNOSIS — G4733 Obstructive sleep apnea (adult) (pediatric): Secondary | ICD-10-CM | POA: Diagnosis not present

## 2018-01-24 ENCOUNTER — Other Ambulatory Visit (HOSPITAL_COMMUNITY): Payer: Self-pay | Admitting: *Deleted

## 2018-01-24 ENCOUNTER — Telehealth (HOSPITAL_COMMUNITY): Payer: Self-pay

## 2018-01-24 NOTE — Progress Notes (Unsigned)
Maalox 1:1 and Viscous lidocaine 2% 1:1 1 tbsp po swish and swallow (may spit) QID 670ml with 2 refills called into [facility] on [address] per Dr. Marthann Schiller orders. Pt notified. [facility] to notify patient when drug is ready per pharmacist. I notified patient of how to take med.

## 2018-01-25 ENCOUNTER — Ambulatory Visit (HOSPITAL_COMMUNITY)
Admission: RE | Admit: 2018-01-25 | Discharge: 2018-01-25 | Disposition: A | Discharge status: Home / Self Care | Payer: Medicare HMO | Source: Ambulatory Visit | Attending: Hematology | Admitting: Hematology

## 2018-01-25 DIAGNOSIS — J439 Emphysema, unspecified: Secondary | ICD-10-CM | POA: Insufficient documentation

## 2018-01-25 DIAGNOSIS — C3490 Malignant neoplasm of unspecified part of unspecified bronchus or lung: Secondary | ICD-10-CM | POA: Diagnosis present

## 2018-01-25 DIAGNOSIS — R918 Other nonspecific abnormal finding of lung field: Secondary | ICD-10-CM | POA: Diagnosis not present

## 2018-01-25 DIAGNOSIS — Z5111 Encounter for antineoplastic chemotherapy: Secondary | ICD-10-CM | POA: Diagnosis not present

## 2018-01-25 DIAGNOSIS — M899 Disorder of bone, unspecified: Secondary | ICD-10-CM | POA: Insufficient documentation

## 2018-01-25 DIAGNOSIS — I700 Atherosclerosis of aorta: Secondary | ICD-10-CM | POA: Insufficient documentation

## 2018-01-25 MED ORDER — IOPAMIDOL (ISOVUE-300) INJECTION 61%
100.0000 mL | Freq: Once | INTRAVENOUS | Status: AC | PRN
  Administered 2018-01-25: 100 mL via INTRAVENOUS

## 2018-01-29 ENCOUNTER — Inpatient Hospital Stay (HOSPITAL_BASED_OUTPATIENT_CLINIC_OR_DEPARTMENT_OTHER): Payer: Medicare HMO | Admitting: Hematology

## 2018-01-29 ENCOUNTER — Encounter (HOSPITAL_COMMUNITY): Payer: Self-pay | Admitting: Hematology

## 2018-01-29 ENCOUNTER — Inpatient Hospital Stay (HOSPITAL_COMMUNITY): Payer: Medicare HMO

## 2018-01-29 ENCOUNTER — Other Ambulatory Visit: Payer: Self-pay

## 2018-01-29 ENCOUNTER — Telehealth (HOSPITAL_COMMUNITY): Payer: Self-pay | Admitting: Hematology

## 2018-01-29 VITALS — BP 147/60 | HR 72 | Temp 97.6°F | Resp 18 | Wt 175.5 lb

## 2018-01-29 DIAGNOSIS — Z7189 Other specified counseling: Secondary | ICD-10-CM

## 2018-01-29 DIAGNOSIS — C3490 Malignant neoplasm of unspecified part of unspecified bronchus or lung: Secondary | ICD-10-CM | POA: Diagnosis not present

## 2018-01-29 DIAGNOSIS — Z5111 Encounter for antineoplastic chemotherapy: Secondary | ICD-10-CM | POA: Diagnosis not present

## 2018-01-29 DIAGNOSIS — C7951 Secondary malignant neoplasm of bone: Secondary | ICD-10-CM | POA: Diagnosis not present

## 2018-01-29 DIAGNOSIS — Z79899 Other long term (current) drug therapy: Secondary | ICD-10-CM

## 2018-01-29 LAB — COMPREHENSIVE METABOLIC PANEL
ALBUMIN: 3.9 g/dL (ref 3.5–5.0)
ALK PHOS: 102 U/L (ref 38–126)
ALT: 14 U/L — AB (ref 17–63)
AST: 15 U/L (ref 15–41)
Anion gap: 9 (ref 5–15)
BILIRUBIN TOTAL: 0.6 mg/dL (ref 0.3–1.2)
BUN: 19 mg/dL (ref 6–20)
CALCIUM: 9.1 mg/dL (ref 8.9–10.3)
CO2: 25 mmol/L (ref 22–32)
CREATININE: 0.92 mg/dL (ref 0.61–1.24)
Chloride: 103 mmol/L (ref 101–111)
GFR calc Af Amer: >60 mL/min (ref >60)
GFR calc non Af Amer: >60 mL/min (ref >60)
GLUCOSE: 132 mg/dL — AB (ref 65–99)
Potassium: 4.2 mmol/L (ref 3.5–5.1)
SODIUM: 137 mmol/L (ref 135–145)
TOTAL PROTEIN: 6.9 g/dL (ref 6.5–8.1)

## 2018-01-29 LAB — CBC WITH DIFFERENTIAL/PLATELET
BASOS PCT: 1 %
Basophils Absolute: 0 10*3/uL (ref 0.0–0.1)
EOS PCT: 3 %
Eosinophils Absolute: 0.1 10*3/uL (ref 0.0–0.7)
HEMATOCRIT: 35.6 % — AB (ref 39.0–52.0)
HEMOGLOBIN: 11.4 g/dL — AB (ref 13.0–17.0)
LYMPHS ABS: 1.2 10*3/uL (ref 0.7–4.0)
LYMPHS PCT: 26 %
MCH: 30.2 pg (ref 26.0–34.0)
MCHC: 32 g/dL (ref 30.0–36.0)
MCV: 94.4 fL (ref 78.0–100.0)
Monocytes Absolute: 0.6 10*3/uL (ref 0.1–1.0)
Monocytes Relative: 14 %
Neutro Abs: 2.6 10*3/uL (ref 1.7–7.7)
Neutrophils Relative %: 56 %
Platelets: 186 10*3/uL (ref 150–400)
RBC: 3.77 MIL/uL — AB (ref 4.22–5.81)
RDW: 17.7 % — ABNORMAL HIGH (ref 11.5–15.5)
WBC: 4.5 10*3/uL (ref 4.0–10.5)

## 2018-01-29 LAB — HEMOGLOBIN A1C
HEMOGLOBIN A1C: 5.8 % — AB (ref 4.8–5.6)
MEAN PLASMA GLUCOSE: 119.76 mg/dL

## 2018-01-29 LAB — LACTATE DEHYDROGENASE: LDH: 154 U/L (ref 98–192)

## 2018-01-29 MED ORDER — PALONOSETRON HCL INJECTION 0.25 MG/5ML
INTRAVENOUS | Status: AC
  Filled 2018-01-29: qty 5

## 2018-01-29 MED ORDER — DEXAMETHASONE SODIUM PHOSPHATE 10 MG/ML IJ SOLN
INTRAMUSCULAR | Status: AC
  Filled 2018-01-29: qty 1

## 2018-01-29 MED ORDER — SODIUM CHLORIDE 0.9 % IV SOLN
10.0000 mg | Freq: Once | INTRAVENOUS | Status: DC
  Filled 2018-01-29: qty 1

## 2018-01-29 MED ORDER — PALONOSETRON HCL INJECTION 0.25 MG/5ML
0.2500 mg | Freq: Once | INTRAVENOUS | Status: AC
  Administered 2018-01-29: 0.25 mg via INTRAVENOUS

## 2018-01-29 MED ORDER — SODIUM CHLORIDE 0.9 % IV SOLN
1200.0000 mg | Freq: Once | INTRAVENOUS | Status: AC
  Administered 2018-01-29: 1200 mg via INTRAVENOUS
  Filled 2018-01-29: qty 20

## 2018-01-29 MED ORDER — SODIUM CHLORIDE 0.9% FLUSH
10.0000 mL | INTRAVENOUS | Status: DC | PRN
  Administered 2018-01-29: 10 mL
  Filled 2018-01-29: qty 10

## 2018-01-29 MED ORDER — DEXAMETHASONE SODIUM PHOSPHATE 10 MG/ML IJ SOLN
10.0000 mg | Freq: Once | INTRAMUSCULAR | Status: AC
  Administered 2018-01-29: 10 mg via INTRAVENOUS

## 2018-01-29 MED ORDER — SODIUM CHLORIDE 0.9 % IV SOLN
100.0000 mg/m2 | Freq: Once | INTRAVENOUS | Status: AC
  Administered 2018-01-29: 200 mg via INTRAVENOUS
  Filled 2018-01-29: qty 10

## 2018-01-29 MED ORDER — SODIUM CHLORIDE 0.9 % IV SOLN
621.5000 mg | Freq: Once | INTRAVENOUS | Status: AC
  Administered 2018-01-29: 620 mg via INTRAVENOUS
  Filled 2018-01-29: qty 62

## 2018-01-29 MED ORDER — SODIUM CHLORIDE 0.9 % IV SOLN
Freq: Once | INTRAVENOUS | Status: AC
  Administered 2018-01-29: 500 mL via INTRAVENOUS

## 2018-01-29 NOTE — Patient Instructions (Signed)
[facility] Discharge Instructions for Patients Receiving Chemotherapy  Today you received the following chemotherapy agents tecentriq, etoposide, carboplatikn.   If you develop nausea and vomiting that is not controlled by your nausea medication, call the clinic.   BELOW ARE SYMPTOMS THAT SHOULD BE REPORTED IMMEDIATELY:  *FEVER GREATER THAN 100.5 F  *CHILLS WITH OR WITHOUT FEVER  NAUSEA AND VOMITING THAT IS NOT CONTROLLED WITH YOUR NAUSEA MEDICATION  *UNUSUAL SHORTNESS OF BREATH  *UNUSUAL BRUISING OR BLEEDING  TENDERNESS IN MOUTH AND THROAT WITH OR WITHOUT PRESENCE OF ULCERS  *URINARY PROBLEMS  *BOWEL PROBLEMS  UNUSUAL RASH Items with * indicate a potential emergency and should be followed up as soon as possible.  Feel free to call the clinic should you have any questions or concerns. The clinic phone number is [phone].  Please show the [facility] at check-in to the Emergency Department and triage nurse.

## 2018-01-29 NOTE — Telephone Encounter (Signed)
FAXED PA REQ TO [facility]

## 2018-01-29 NOTE — Progress Notes (Signed)
Patient tolerated chemotherapy with no complaints voiced.  Port site clean and dry with no bruising or swelling noted at site.  Flushed with no complaints of pain. Good blood return noted before and after administration of chemotherapy.  Site left accessed and reinforced dressing.  VSS with discharge and left ambulatory with wife.  No s/s of distress noted.

## 2018-01-29 NOTE — Progress Notes (Signed)
[name] [name], [name] [name] [id number]   CLINIC:  Medical Oncology/Hematology  PCP:  [facility], [place] [address] [phone]   REASON FOR VISIT:  Follow-up for extensive stage small cell lung cancer.  CURRENT THERAPY: Chemo immunotherapy.   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 male returns for routine follow-up and cycle 4 of chemotherapy.  He had his third cycle 3 weeks ago.  He had mild nausea without any vomiting.  He noticed some pain on the left side of the throat about 2 weeks ago.  He denies any hearing issues.  Denies any fevers or chills.  He has mild fatigue.  His back pain is stable.  His appetite has been good.   REVIEW OF SYSTEMS:  Review of Systems  Constitutional: Positive for fatigue.  HENT:   Positive for trouble swallowing.        Pain in the left side of the neck present.  Hematological: Bruises/bleeds easily.     PAST MEDICAL/SURGICAL HISTORY:  Past Medical History:  Diagnosis Date  . Anxiety   . Asthma   . COPD (chronic obstructive pulmonary disease) ([place])   . DDD (degenerative disc disease)   . Diverticulitis   . GERD (gastroesophageal reflux disease)   . Headache   . Hepatitis    in the late 70's   . HTN (hypertension)   . Mediastinal adenopathy   . Seizures ([place])    as of 2019 none for 20 years  . Sleep apnea    does not  use cpap  . Stroke [facility])    TIA - Feb. 2018   Past Surgical History:  Procedure Laterality Date  . CERVICAL DISC SURGERY     X4  . CHOLECYSTECTOMY    . COLONOSCOPY  08/24/2011   Procedure: COLONOSCOPY;  Surgeon: Daneil Dolin, MD;  Location: [facility] SU[facility];  Service: Endoscopy;  Laterality: N/A;  7:30AM  .  ELBOW SURGERY     right  . PORTACATH PLACEMENT Right 12/01/2017   Procedure: INSERTION PORT-A-CATH;  Surgeon: Aviva Signs, MD;  Location: [facility] ORS;  Service: General;  Laterality: Right;  Marland Kitchen VIDEO BRONCHOSCOPY WITH ENDOBRONCHIAL ULTRASOUND N/A 10/31/2017   Procedure: VIDEO BRONCHOSCOPY WITH ENDOBRONCHIAL ULTRASOUND;  Surgeon: Ivin Poot, MD;  Location: [place];  Service: Thoracic;  Laterality: N/A;  . VIDEO BRONCHOSCOPY WITH ENDOBRONCHIAL ULTRASOUND N/A 11/22/2017   Procedure: VIDEO BRONCHOSCOPY WITH ENDOBRONCHIAL ULTRASOUND;  Surgeon: Melrose Nakayama, MD;  Location: [place];  Service: Thoracic;  Laterality: N/A;     SOCIAL HISTORY:  Social History   Socioeconomic History  . Marital status: Married    Spouse name: Not on file  . Number of children: Not on file  . Years of education: Not on file  . Highest education level: Not on file  Occupational History  . Occupation: disability  Social Needs  . Financial resource strain: Not on file  . Food insecurity:    Worry: Not on file    Inability: Not on file  . Transportation needs:    Medical: Not on file    Non-medical: Not on file  Tobacco Use  . Smoking status: Former Smoker    Packs/day: 1.50    Years:  30.00    Pack years: 45.00    Types: Cigarettes    Last attempt to quit: 10/13/2017    Years since quitting: 0.2  . Smokeless tobacco: Never Used  Substance and Sexual Activity  . Alcohol use: No  . Drug use: No  . Sexual activity: Yes    Birth control/protection: None  Lifestyle  . Physical activity:    Days per week: Not on file    Minutes per session: Not on file  . Stress: Not on file  Relationships  . Social connections:    Talks on phone: Not on file    Gets together: Not on file    Attends religious service: Not on file    Active member of club or organization: Not on file    Attends meetings of clubs or organizations: Not on file    Relationship status: Not on file  . Intimate partner violence:    Fear  of current or ex partner: Not on file    Emotionally abused: Not on file    Physically abused: Not on file    Forced sexual activity: Not on file  Other Topics Concern  . Not on file  Social History Narrative  . Not on file    FAMILY HISTORY:  Family History  Problem Relation Age of Onset  . Hypertension Father   . Heart attack Father   . Kidney failure Mother   . Diabetes Mother   . Hypertension Mother   . Colon cancer Neg Hx   . Anesthesia problems Neg Hx   . Hypotension Neg Hx   . Malignant hyperthermia Neg Hx   . Pseudochol deficiency Neg Hx     CURRENT MEDICATIONS:  Outpatient Encounter Medications as of 01/29/2018  Medication Sig Note  . albuterol (PROVENTIL HFA;VENTOLIN HFA) 108 (90 BASE) MCG/ACT inhaler Inhale 2 puffs into the lungs every 6 (six) hours as needed for wheezing or shortness of breath.    Marland Kitchen albuterol (PROVENTIL) (2.5 MG/3ML) 0.083% nebulizer solution Take 2.5 mg by nebulization every 6 (six) hours as needed for wheezing or shortness of breath.    . alprazolam (XANAX) 2 MG tablet Take 2 mg by mouth every 6 (six) hours as needed for sleep or anxiety.    Marland Kitchen amLODipine (NORVASC) 10 MG tablet Take 10 mg by mouth daily.   Huey Bienenstock (TECENTRIQ IV) Inject into the vein. Every 21 days   . CARBOPLATIN IV Inject into the vein. Every 21 days   . cyclobenzaprine (FLEXERIL) 10 MG tablet Take 10 mg by mouth daily as needed for muscle spasms.    . diclofenac sodium (VOLTAREN) 1 % GEL Apply 2 g topically 4 (four) times daily. Apply to left forearm phlebitic area   . docusate sodium (COLACE) 100 MG capsule Take 1 capsule (100 mg total) by mouth every 12 (twelve) hours.   . ETOPOSIDE IV Inject into the vein. Days 1,2,3 every 21 days   . fluticasone (FLONASE) 50 MCG/ACT nasal spray Place 2 sprays into both nostrils daily as needed for allergies.    . Glucosamine-Chondroit-Vit C-Mn (GLUCOSAMINE 1500 COMPLEX) CAPS Take 1 capsule by mouth.   . levETIRAcetam (KEPPRA) 500 MG  tablet Take 500 mg by mouth daily.    Marland Kitchen lidocaine (XYLOCAINE) 2 % solution    . lidocaine-prilocaine (EMLA) cream APPLY TO PORTACATH SITE AS DIRECTED   . lisinopril (PRINIVIL,ZESTRIL) 20 MG tablet Take 20 mg by mouth 2 (two) times daily.   . meclizine (ANTIVERT) 12.5 MG  tablet Take 1 tablet (12.5 mg total) by mouth 3 (three) times daily as needed for dizziness.   . MULTIPLE VITAMINS PO Take 1 tablet by mouth.   . naproxen (NAPROSYN) 500 MG tablet Take 500 mg by mouth 2 (two) times daily with a meal.     . NONFORMULARY OR COMPOUNDED ITEM SWISH AND SWALLOW 15 MLS PO QID   . Omega-3 Fatty Acids (FISH OIL) 1000 MG CAPS Take 1,000 mg by mouth 2 (two) times daily.  10/26/2017: Stopped for procedure  . ondansetron (ZOFRAN) 8 MG tablet Take 1 tablet (8 mg total) by mouth 2 (two) times daily as needed for refractory nausea / vomiting. Start on day 3 after carboplatin chemo.   Marland Kitchen oxyCODONE (ROXICODONE) 15 MG immediate release tablet Take 15 mg by mouth 4 (four) times daily as needed for pain.    . polyethylene glycol (MIRALAX / GLYCOLAX) packet Take 17 g by mouth daily.   . prochlorperazine (COMPAZINE) 10 MG tablet Take 1 tablet (10 mg total) by mouth every 6 (six) hours as needed (Nausea or vomiting).   . tamsulosin (FLOMAX) 0.4 MG CAPS capsule Take 0.4 mg by mouth daily.    . temazepam (RESTORIL) 30 MG capsule TAKE 1 CAPSULE AT BEDTIME AS NEEDED FOR SLEEP   . TURMERIC PO Take 4 tablets by mouth.    No facility-administered encounter medications on file as of 01/29/2018.     ALLERGIES:  No Known Allergies   PHYSICAL EXAM:  ECOG Performance status: 1.  I have reviewed his vital signs. Physical Exam Focused examination of his throat did not reveal any abnormalities.  The left side of the neck did not have any palpable masses or adenopathy.  LABORATORY DATA:  I have reviewed the labs as listed.  CBC    Component Value Date/Time   WBC 4.5 01/29/2018 0823   RBC 3.77 (L) 01/29/2018 0823   HGB  11.4 (L) 01/29/2018 0823   HCT 35.6 (L) 01/29/2018 0823   PLT 186 01/29/2018 0823   MCV 94.4 01/29/2018 0823   MCH 30.2 01/29/2018 0823   MCHC 32.0 01/29/2018 0823   RDW 17.7 (H) 01/29/2018 0823   LYMPHSABS 1.2 01/29/2018 0823   MONOABS 0.6 01/29/2018 0823   EOSABS 0.1 01/29/2018 0823   BASOSABS 0.0 01/29/2018 0823   CMP Latest Ref Rng & Units 01/29/2018 01/08/2018 12/18/2017  Glucose 65 - 99 mg/dL 132(H) 101(H) 112(H)  BUN 6 - 20 mg/dL 19 17 12   Creatinine 0.61 - 1.24 mg/dL 0.92 0.91 0.90  Sodium 135 - 145 mmol/L 137 138 138  Potassium 3.5 - 5.1 mmol/L 4.2 4.8 4.0  Chloride 101 - 111 mmol/L 103 105 104  CO2 22 - 32 mmol/L 25 25 25   Calcium 8.9 - 10.3 mg/dL 9.1 9.3 9.1  Total Protein 6.5 - 8.1 g/dL 6.9 7.0 6.9  Total Bilirubin 0.3 - 1.2 mg/dL 0.6 0.6 0.9  Alkaline Phos 38 - 126 U/L 102 108 115  AST 15 - 41 U/L 15 16 15   ALT 17 - 63 U/L 14(L) 16(L) 14(L)       DIAGNOSTIC IMAGING:  I have reviewed the images of his CT scan from 01/25/2018 and agree with radiology report.     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  Extensive stage small cell lung cancer: -Cycle 1 of carboplatin, etoposide and Atezolizumab on 11/27/2017, tolerated well except slight nausea couple of times. -CT scan of the chest, abdomen  after 3 cycles of chemo immunotherapy on 01/25/2018 shows near complete resolution of the adenopathy and lung mass.  Sclerosis and bone mets noted.  I have reviewed his blood counts today.  He will proceed with cycle 4 without any dose modifications. -We will proceed with Atezolizumab 1200 mg every 3 weeks maintenance after cycle 4.  I will see him back in 6 weeks for follow-up.  I plan to repeat scans in 3 months.  2. Bone metastasis: - He has no teeth on the upper jaw.  On the lower jaw he has about 5 teeth and a plate.  He does not report any dental caries.  We talked about initiating him on bone strengthening agent denosumab once every  4 weeks.  We talked about the side effects in detail including hypocalcemia, flulike symptoms and rare chance of osteonecrosis of the jaw.  He understands and gives his permission to proceed with the treatment.  We will get prior authorization from his insurance.  He was told to take calcium and vitamin D twice daily.        Orders placed this encounter:  Orders Placed This Encounter  Procedures  . Hemoglobin A1c      Derek Jack, MD [place] [phone]

## 2018-01-29 NOTE — Assessment & Plan Note (Signed)
1.  Extensive stage small cell lung cancer: -Cycle 1 of carboplatin, etoposide and Atezolizumab on 11/27/2017, tolerated well except slight nausea couple of times. -CT scan of the chest, abdomen after 3 cycles of chemo immunotherapy on 01/25/2018 shows near complete resolution of the adenopathy and lung mass.  Sclerosis and bone mets noted.  I have reviewed his blood counts today.  He will proceed with cycle 4 without any dose modifications. -We will proceed with Atezolizumab 1200 mg every 3 weeks maintenance after cycle 4.  I will see him back in 6 weeks for follow-up.  I plan to repeat scans in 3 months.  2. Bone metastasis: - He has no teeth on the upper jaw.  On the lower jaw he has about 5 teeth and a plate.  He does not report any dental caries.  We talked about initiating him on bone strengthening agent denosumab once every 4 weeks.  We talked about the side effects in detail including hypocalcemia, flulike symptoms and rare chance of osteonecrosis of the jaw.  He understands and gives his permission to proceed with the treatment.  We will get prior authorization from his insurance.  He was told to take calcium and vitamin D twice daily.

## 2018-01-30 ENCOUNTER — Other Ambulatory Visit (HOSPITAL_COMMUNITY): Payer: Medicare HMO

## 2018-01-30 ENCOUNTER — Ambulatory Visit (HOSPITAL_COMMUNITY): Payer: Medicare HMO

## 2018-01-30 ENCOUNTER — Inpatient Hospital Stay (HOSPITAL_COMMUNITY): Payer: Medicare HMO

## 2018-01-30 ENCOUNTER — Ambulatory Visit (HOSPITAL_COMMUNITY): Payer: Medicare HMO | Admitting: Hematology

## 2018-01-30 VITALS — BP 156/72 | HR 64 | Temp 97.7°F | Resp 16

## 2018-01-30 DIAGNOSIS — Z5111 Encounter for antineoplastic chemotherapy: Secondary | ICD-10-CM | POA: Diagnosis not present

## 2018-01-30 DIAGNOSIS — Z7189 Other specified counseling: Secondary | ICD-10-CM

## 2018-01-30 DIAGNOSIS — C3490 Malignant neoplasm of unspecified part of unspecified bronchus or lung: Secondary | ICD-10-CM

## 2018-01-30 MED ORDER — SODIUM CHLORIDE 0.9% FLUSH
10.0000 mL | INTRAVENOUS | Status: DC | PRN
  Administered 2018-01-30: 10 mL
  Filled 2018-01-30: qty 10

## 2018-01-30 MED ORDER — LORAZEPAM 2 MG/ML IJ SOLN
INTRAMUSCULAR | Status: AC
  Filled 2018-01-30: qty 1

## 2018-01-30 MED ORDER — SODIUM CHLORIDE 0.9 % IV SOLN
100.0000 mg/m2 | Freq: Once | INTRAVENOUS | Status: AC
  Administered 2018-01-30: 200 mg via INTRAVENOUS
  Filled 2018-01-30: qty 10

## 2018-01-30 MED ORDER — SODIUM CHLORIDE 0.9 % IV SOLN: 10.0000 mg | Freq: Once | INTRAVENOUS | Status: DC

## 2018-01-30 MED ORDER — DEXAMETHASONE SODIUM PHOSPHATE 10 MG/ML IJ SOLN
10.0000 mg | Freq: Once | INTRAMUSCULAR | Status: AC
  Administered 2018-01-30: 10 mg via INTRAVENOUS

## 2018-01-30 MED ORDER — LORAZEPAM 2 MG/ML IJ SOLN
0.5000 mg | Freq: Once | INTRAMUSCULAR | Status: AC
  Administered 2018-01-30: 0.5 mg via INTRAVENOUS

## 2018-01-30 MED ORDER — DEXAMETHASONE SODIUM PHOSPHATE 10 MG/ML IJ SOLN
INTRAMUSCULAR | Status: AC
  Filled 2018-01-30: qty 1

## 2018-01-30 MED ORDER — SODIUM CHLORIDE 0.9 % IV SOLN
Freq: Once | INTRAVENOUS | Status: AC
  Administered 2018-01-30: 11:00:00 via INTRAVENOUS

## 2018-01-30 NOTE — Patient Instructions (Signed)
[facility] Discharge Instructions for Patients Receiving Chemotherapy  Today you received the following chemotherapy agents etoposid3.    If you develop nausea and vomiting that is not controlled by your nausea medication, call the clinic.   BELOW ARE SYMPTOMS THAT SHOULD BE REPORTED IMMEDIATELY:  *FEVER GREATER THAN 100.5 F  *CHILLS WITH OR WITHOUT FEVER  NAUSEA AND VOMITING THAT IS NOT CONTROLLED WITH YOUR NAUSEA MEDICATION  *UNUSUAL SHORTNESS OF BREATH  *UNUSUAL BRUISING OR BLEEDING  TENDERNESS IN MOUTH AND THROAT WITH OR WITHOUT PRESENCE OF ULCERS  *URINARY PROBLEMS  *BOWEL PROBLEMS  UNUSUAL RASH Items with * indicate a potential emergency and should be followed up as soon as possible.  Feel free to call the clinic should you have any questions or concerns. The clinic phone number is [phone].  Please show the [facility] at check-in to the Emergency Department and triage nurse.

## 2018-01-30 NOTE — Progress Notes (Signed)
Patient tolerated chemotherapy with no complaints voiced.  Port site clean and dry with good blood return noted before and after administration of chemotherapy.  No complaints of pain with flush.  Dressing intact.  VSS with discharge and left ambulatory with family with no s/s of distress noted.

## 2018-01-31 ENCOUNTER — Ambulatory Visit (HOSPITAL_COMMUNITY): Payer: Medicare HMO

## 2018-01-31 ENCOUNTER — Telehealth (HOSPITAL_COMMUNITY): Payer: Self-pay | Admitting: Hematology

## 2018-01-31 ENCOUNTER — Inpatient Hospital Stay (HOSPITAL_COMMUNITY): Payer: Medicare HMO

## 2018-01-31 VITALS — BP 154/79 | HR 57 | Temp 97.8°F | Resp 16

## 2018-01-31 DIAGNOSIS — Z7189 Other specified counseling: Secondary | ICD-10-CM

## 2018-01-31 DIAGNOSIS — Z5111 Encounter for antineoplastic chemotherapy: Secondary | ICD-10-CM | POA: Diagnosis not present

## 2018-01-31 DIAGNOSIS — C3490 Malignant neoplasm of unspecified part of unspecified bronchus or lung: Secondary | ICD-10-CM

## 2018-01-31 MED ORDER — HEPARIN SOD (PORK) LOCK FLUSH 100 UNIT/ML IV SOLN
500.0000 [IU] | Freq: Once | INTRAVENOUS | Status: AC | PRN
  Administered 2018-01-31: 500 [IU]
  Filled 2018-01-31: qty 5

## 2018-01-31 MED ORDER — LORAZEPAM 2 MG/ML IJ SOLN
1.0000 mg | Freq: Once | INTRAMUSCULAR | Status: AC
  Administered 2018-01-31: 1 mg via INTRAVENOUS

## 2018-01-31 MED ORDER — DEXAMETHASONE SODIUM PHOSPHATE 10 MG/ML IJ SOLN
10.0000 mg | Freq: Once | INTRAMUSCULAR | Status: AC
  Administered 2018-01-31: 10 mg via INTRAVENOUS

## 2018-01-31 MED ORDER — SODIUM CHLORIDE 0.9 % IV SOLN: 10.0000 mg | Freq: Once | INTRAVENOUS | Status: DC

## 2018-01-31 MED ORDER — DEXAMETHASONE SODIUM PHOSPHATE 10 MG/ML IJ SOLN
INTRAMUSCULAR | Status: AC
Start: 2018-01-31 — End: ?
  Filled 2018-01-31: qty 1

## 2018-01-31 MED ORDER — HEPARIN SOD (PORK) LOCK FLUSH 100 UNIT/ML IV SOLN
INTRAVENOUS | Status: AC
Start: 2018-01-31 — End: ?
  Filled 2018-01-31: qty 5

## 2018-01-31 MED ORDER — SODIUM CHLORIDE 0.9 % IV SOLN
100.0000 mg/m2 | Freq: Once | INTRAVENOUS | Status: AC
  Administered 2018-01-31: 200 mg via INTRAVENOUS
  Filled 2018-01-31: qty 10

## 2018-01-31 MED ORDER — SODIUM CHLORIDE 0.9% FLUSH
10.0000 mL | INTRAVENOUS | Status: DC | PRN
  Administered 2018-01-31: 10 mL
  Filled 2018-01-31: qty 10

## 2018-01-31 MED ORDER — SODIUM CHLORIDE 0.9 % IV SOLN
Freq: Once | INTRAVENOUS | Status: AC
  Administered 2018-01-31: 09:00:00 via INTRAVENOUS

## 2018-01-31 MED ORDER — LORAZEPAM 2 MG/ML IJ SOLN
INTRAMUSCULAR | Status: AC
  Filled 2018-01-31: qty 1

## 2018-01-31 NOTE — Patient Instructions (Signed)
[facility] Discharge Instructions for Patients Receiving Chemotherapy   Beginning January 23rd 2017 lab work for the Cancer Center will be done in the  Main lab at [place] on 1st floor. If you have a lab appointment with the Cancer Center please come in thru the  Main Entrance and check in at the main information desk   Today you received the following chemotherapy agents   To help prevent nausea and vomiting after your treatment, we encourage you to take your nausea medication     If you develop nausea and vomiting, or diarrhea that is not controlled by your medication, call the clinic.  The clinic phone number is [phone]. Office hours are Monday-Friday 8:30am-5:00pm.  BELOW ARE SYMPTOMS THAT SHOULD BE REPORTED IMMEDIATELY:  *FEVER GREATER THAN 101.0 F  *CHILLS WITH OR WITHOUT FEVER  NAUSEA AND VOMITING THAT IS NOT CONTROLLED WITH YOUR NAUSEA MEDICATION  *UNUSUAL SHORTNESS OF BREATH  *UNUSUAL BRUISING OR BLEEDING  TENDERNESS IN MOUTH AND THROAT WITH OR WITHOUT PRESENCE OF ULCERS  *URINARY PROBLEMS  *BOWEL PROBLEMS  UNUSUAL RASH Items with * indicate a potential emergency and should be followed up as soon as possible. If you have an emergency after office hours please contact your primary care physician or go to the nearest emergency department.  Please call the clinic during office hours if you have any questions or concerns.   You may also contact the Patient Navigator at [phone] should you have any questions or need assistance in obtaining follow up care.      Resources For Cancer Patients and their Caregivers ? American Cancer Society: Can assist with transportation, wigs, general needs, runs Look Good Feel Better.        1-888-227-6333 ? Cancer Care: Provides financial assistance, online support groups, medication/co-pay assistance.  1-800-813-HOPE (4673) ? [facility] Assists [place] cancer  patients and their families through emotional , educational and financial support.  336-427-4357 ? [place] DSS Where to apply for food stamps, Medicaid and utility assistance. 336-342-1394 ? RCATS: Transportation to medical appointments. 336-347-2287 ? Social Security Administration: May apply for disability if have a Stage IV cancer. 336-342-7796 1-800-772-1213 ? [place] Aging, Disability and Transit Services: Assists with nutrition, care and transit needs. 336-349-2343         

## 2018-01-31 NOTE — Progress Notes (Signed)
Treatment given per orders. Patient tolerated it well without problems. Vitals stable and discharged home from clinic ambulatory. Follow up as scheduled.  

## 2018-01-31 NOTE — Telephone Encounter (Signed)
PC TO [employer] TO CHECK STATUS OF XGEVA PA. PER CHRISTINA PA IS STILL PENDING AND THERE IS A 14D TURN AROUND TIME.

## 2018-02-01 ENCOUNTER — Ambulatory Visit (HOSPITAL_COMMUNITY): Payer: Medicare HMO

## 2018-02-02 ENCOUNTER — Inpatient Hospital Stay (HOSPITAL_COMMUNITY): Payer: Medicare HMO

## 2018-02-02 ENCOUNTER — Encounter (HOSPITAL_COMMUNITY): Payer: Self-pay

## 2018-02-02 VITALS — BP 156/80 | HR 61 | Temp 97.9°F | Resp 16

## 2018-02-02 DIAGNOSIS — C7951 Secondary malignant neoplasm of bone: Secondary | ICD-10-CM

## 2018-02-02 DIAGNOSIS — Z5111 Encounter for antineoplastic chemotherapy: Secondary | ICD-10-CM | POA: Diagnosis not present

## 2018-02-02 DIAGNOSIS — Z7189 Other specified counseling: Secondary | ICD-10-CM

## 2018-02-02 DIAGNOSIS — C3490 Malignant neoplasm of unspecified part of unspecified bronchus or lung: Secondary | ICD-10-CM

## 2018-02-02 MED ORDER — DENOSUMAB 120 MG/1.7ML ~~LOC~~ SOLN
120.0000 mg | Freq: Once | SUBCUTANEOUS | Status: AC
  Administered 2018-02-02: 120 mg via SUBCUTANEOUS
  Filled 2018-02-02: qty 1.7

## 2018-02-02 MED ORDER — PEGFILGRASTIM-CBQV 6 MG/0.6ML ~~LOC~~ SOSY
6.0000 mg | PREFILLED_SYRINGE | Freq: Once | SUBCUTANEOUS | Status: AC
  Administered 2018-02-02: 6 mg via SUBCUTANEOUS
  Filled 2018-02-02: qty 0.6

## 2018-02-02 NOTE — Patient Instructions (Signed)
[place] at [facility]  Discharge Instructions:  You received a udenyca shot and xgeva today.  _______________________________________________________________  Thank you for choosing [place] at [facility] to provide your oncology and hematology care.  To afford each patient quality time with our providers, please arrive at least 15 minutes before your scheduled appointment.  You need to re-schedule your appointment if you arrive 10 or more minutes late.  We strive to give you quality time with our providers, and arriving late affects you and other patients whose appointments are after yours.  Also, if you no show three or more times for appointments you may be dismissed from the clinic.  Again, thank you for choosing [place] at [place] hope is that these requests will allow you access to exceptional care and in a timely manner. _______________________________________________________________  If you have questions after your visit, please contact our office at [phone] between the hours of 8:30 a.m. and 5:00 p.m. Voicemails left after 4:30 p.m. will not be returned until the following business day. _______________________________________________________________  For prescription refill requests, have your pharmacy contact our office. _______________________________________________________________  Recommendations made by the consultant and any test results will be sent to your referring physician. _______________________________________________________________

## 2018-02-02 NOTE — Progress Notes (Signed)
Patient to treatment area for neulasta and xgeva.  Patient stated he has noted more tingling in arms, hands, and face starting Wednesday of this week.  No facial drooping noted or slurred speech.  Denied headache. Bilateral grips equal but does have a history of dropping items.  Stated he has always had a history of dropping items and a history of neck surgery.  Patient stated the symptoms usually passes.  Complaints of weakness and denied SOB.  Reviewed symptoms with Dr. Delton Coombes.  Ok to give the [place] shot and xgeva.  Consent obtained for [facility] with written information given.  All questions asked and answered.  Patient to start Calcium as directed.  Denied tooth, jaw, or leg pain.  No recent or upcoming dental visits.  Reviewed side effects of the xgeva shot.  Patient tolerated the injections with no complaints voiced.  Sites clean and dry with no bruising or swelling noted at sites.  Band aids applied.  VSS with discharge.  Instructed the patient when to go to the emergency room with understanding verbalized.  Left ambulatory with wife with no s/s of distress noted.

## 2018-02-09 ENCOUNTER — Other Ambulatory Visit (HOSPITAL_COMMUNITY): Payer: Self-pay | Admitting: Hematology

## 2018-02-09 DIAGNOSIS — C3490 Malignant neoplasm of unspecified part of unspecified bronchus or lung: Secondary | ICD-10-CM

## 2018-02-14 ENCOUNTER — Telehealth (HOSPITAL_COMMUNITY): Payer: Self-pay

## 2018-02-14 DIAGNOSIS — J069 Acute upper respiratory infection, unspecified: Secondary | ICD-10-CM

## 2018-02-14 MED ORDER — AZITHROMYCIN 250 MG PO TABS: ORAL_TABLET | ORAL | 0 refills | Status: DC

## 2018-02-14 NOTE — Telephone Encounter (Signed)
Patient called stating he was having fever of 100.2 and chills. He has a sore throat, SOB which he attributes to his COPD, chest pain which he says is not new.He also states he is weak and fatigued. Reviewed with Dr. Delton Coombes. He ordered a Z-pak for patient. Called patient back and verified that he was not allergic to Zithromax. He states he isn't allergic to anything. Instructed him on the directions. Also if his symptoms get worse he is to call us back or go to the ER if after hours. Patient verbalized understanding of above.

## 2018-02-15 DIAGNOSIS — Z6827 Body mass index (BMI) 27.0-27.9, adult: Secondary | ICD-10-CM | POA: Diagnosis not present

## 2018-02-15 DIAGNOSIS — E663 Overweight: Secondary | ICD-10-CM | POA: Diagnosis not present

## 2018-02-15 DIAGNOSIS — G4733 Obstructive sleep apnea (adult) (pediatric): Secondary | ICD-10-CM | POA: Diagnosis not present

## 2018-02-15 DIAGNOSIS — T07XXXA Unspecified multiple injuries, initial encounter: Secondary | ICD-10-CM | POA: Diagnosis not present

## 2018-02-15 DIAGNOSIS — J069 Acute upper respiratory infection, unspecified: Secondary | ICD-10-CM | POA: Diagnosis not present

## 2018-02-15 DIAGNOSIS — Z1389 Encounter for screening for other disorder: Secondary | ICD-10-CM | POA: Diagnosis not present

## 2018-02-15 NOTE — Telephone Encounter (Signed)
Patient called back this am. He states his wife found a tick on him last night and the area surrounding where the tick was is red and the patient states it looks infected. Reviewed with RN. Explained to patient he should call his PCP and tell him what's been going on. Patient states he saved the tick. Instructed him to take it with him when he sees his PCP.  Patient verbalized understanding.

## 2018-02-19 ENCOUNTER — Other Ambulatory Visit (HOSPITAL_COMMUNITY): Payer: Medicare HMO

## 2018-02-19 ENCOUNTER — Ambulatory Visit (HOSPITAL_COMMUNITY): Payer: Medicare HMO

## 2018-02-20 ENCOUNTER — Ambulatory Visit (HOSPITAL_COMMUNITY): Payer: Medicare HMO

## 2018-02-21 ENCOUNTER — Other Ambulatory Visit (HOSPITAL_COMMUNITY): Payer: Medicare HMO

## 2018-02-21 ENCOUNTER — Ambulatory Visit (HOSPITAL_COMMUNITY): Payer: Medicare HMO

## 2018-02-22 ENCOUNTER — Other Ambulatory Visit: Payer: Self-pay

## 2018-02-22 ENCOUNTER — Inpatient Hospital Stay (HOSPITAL_COMMUNITY): Payer: Medicare HMO | Attending: Hematology

## 2018-02-22 ENCOUNTER — Inpatient Hospital Stay (HOSPITAL_COMMUNITY): Payer: Medicare HMO

## 2018-02-22 ENCOUNTER — Encounter (HOSPITAL_COMMUNITY): Payer: Self-pay

## 2018-02-22 DIAGNOSIS — R69 Illness, unspecified: Secondary | ICD-10-CM | POA: Diagnosis not present

## 2018-02-22 DIAGNOSIS — G894 Chronic pain syndrome: Secondary | ICD-10-CM | POA: Diagnosis not present

## 2018-02-22 DIAGNOSIS — E663 Overweight: Secondary | ICD-10-CM | POA: Diagnosis not present

## 2018-02-22 DIAGNOSIS — C3490 Malignant neoplasm of unspecified part of unspecified bronchus or lung: Secondary | ICD-10-CM | POA: Diagnosis not present

## 2018-02-22 DIAGNOSIS — L309 Dermatitis, unspecified: Secondary | ICD-10-CM | POA: Diagnosis not present

## 2018-02-22 DIAGNOSIS — Z5111 Encounter for antineoplastic chemotherapy: Secondary | ICD-10-CM | POA: Diagnosis present

## 2018-02-22 DIAGNOSIS — C7951 Secondary malignant neoplasm of bone: Secondary | ICD-10-CM | POA: Insufficient documentation

## 2018-02-22 DIAGNOSIS — Z6827 Body mass index (BMI) 27.0-27.9, adult: Secondary | ICD-10-CM | POA: Diagnosis not present

## 2018-02-22 DIAGNOSIS — W57XXXA Bitten or stung by nonvenomous insect and other nonvenomous arthropods, initial encounter: Secondary | ICD-10-CM | POA: Diagnosis not present

## 2018-02-22 DIAGNOSIS — J449 Chronic obstructive pulmonary disease, unspecified: Secondary | ICD-10-CM | POA: Diagnosis not present

## 2018-02-22 DIAGNOSIS — Z79899 Other long term (current) drug therapy: Secondary | ICD-10-CM | POA: Insufficient documentation

## 2018-02-22 LAB — CBC WITH DIFFERENTIAL/PLATELET
Basophils Absolute: 0.1 10*3/uL (ref 0.0–0.1)
Basophils Relative: 1 %
Eosinophils Absolute: 0.1 10*3/uL (ref 0.0–0.7)
Eosinophils Relative: 2 %
HCT: 35 % — ABNORMAL LOW (ref 39.0–52.0)
Hemoglobin: 11.5 g/dL — ABNORMAL LOW (ref 13.0–17.0)
Lymphocytes Relative: 24 %
Lymphs Abs: 1.4 10*3/uL (ref 0.7–4.0)
MCH: 32.3 pg (ref 26.0–34.0)
MCHC: 32.9 g/dL (ref 30.0–36.0)
MCV: 98.3 fL (ref 78.0–100.0)
Monocytes Absolute: 0.7 10*3/uL (ref 0.1–1.0)
Monocytes Relative: 12 %
Neutro Abs: 3.5 10*3/uL (ref 1.7–7.7)
Neutrophils Relative %: 61 %
Platelets: 283 10*3/uL (ref 150–400)
RBC: 3.56 MIL/uL — ABNORMAL LOW (ref 4.22–5.81)
RDW: 17.3 % — ABNORMAL HIGH (ref 11.5–15.5)
WBC: 5.8 10*3/uL (ref 4.0–10.5)

## 2018-02-22 LAB — COMPREHENSIVE METABOLIC PANEL
ALT: 18 U/L (ref 17–63)
AST: 20 U/L (ref 15–41)
Albumin: 4 g/dL (ref 3.5–5.0)
Alkaline Phosphatase: 92 U/L (ref 38–126)
Anion gap: 8 (ref 5–15)
BILIRUBIN TOTAL: 0.5 mg/dL (ref 0.3–1.2)
BUN: 21 mg/dL — AB (ref 6–20)
CHLORIDE: 105 mmol/L (ref 101–111)
CO2: 25 mmol/L (ref 22–32)
CREATININE: 0.88 mg/dL (ref 0.61–1.24)
Calcium: 9.8 mg/dL (ref 8.9–10.3)
GFR calc Af Amer: >60 mL/min (ref >60)
GLUCOSE: 105 mg/dL — AB (ref 65–99)
Potassium: 4.2 mmol/L (ref 3.5–5.1)
Sodium: 138 mmol/L (ref 135–145)
Total Protein: 7.3 g/dL (ref 6.5–8.1)

## 2018-02-22 LAB — TSH: TSH: 0.891 u[IU]/mL (ref 0.350–4.500)

## 2018-02-22 NOTE — Progress Notes (Signed)
Patient presented today for tencentriq. Patient stated he found a deer tick last Wednesday June the 5th on his body, stated he developed a red rash on his legs and chest, also had blisters on his groin, legs, chest which are better now. Patient seen by PCP and was given a  zpack and bactrim ointment. Also given Doxycycline but was told not to take it till he had "flu-like" symptoms.  Vitals obtained, labs reviewed with Dr. Delton Coombes, will hold treatment today and try next week.   Patient stated he was going to go see his PCP today when he left clinic.   Vitals stable and discharged home from clinic ambulatory. Follow up as scheduled.

## 2018-02-22 NOTE — Patient Instructions (Signed)
[place] at [facility] Discharge Instructions  No treatment today. Follow up as scheduled   Thank you for choosing [place] at [facility] to provide your oncology and hematology care.  To afford each patient quality time with our provider, please arrive at least 15 minutes before your scheduled appointment time.   If you have a lab appointment with the [facility] please come in thru the  Main Entrance and check in at the main information desk  You need to re-schedule your appointment should you arrive 10 or more minutes late.  We strive to give you quality time with our providers, and arriving late affects you and other patients whose appointments are after yours.  Also, if you no show three or more times for appointments you may be dismissed from the clinic at the providers discretion.     Again, thank you for choosing [facility].  Our hope is that these requests will decrease the amount of time that you wait before being seen by our physicians.       _____________________________________________________________  Should you have questions after your visit to [facility], please contact our office at [phone] between the hours of 8:30 a.m. and 4:30 p.m.  Voicemails left after 4:30 p.m. will not be returned until the following business day.  For prescription refill requests, have your pharmacy contact our office.       Resources For Cancer Patients and their Caregivers ? American Cancer Society: Can assist with transportation, wigs, general needs, runs Look Good Feel Better.        2166911620 ? Cancer Care: Provides financial assistance, online support groups, medication/co-pay assistance.  1-800-813-HOPE 315-753-1888) ? [name] Assists [place] cancer patients and their families through emotional , educational and financial support.  785-377-5152 ? [place]  DSS Where to apply for food stamps, Medicaid and utility assistance. 769-457-8965 ? RCATS: Transportation to medical appointments. 726-176-7058 ? Social Security Administration: May apply for disability if have a Stage IV cancer. 206-684-5546 781-370-6934 ? [employer], Disability and Transit Services: Assists with nutrition, care and transit needs. [place] Support Programs:   > Cancer Support Group  2nd Tuesday of the month 1pm-2pm, Journey Room   > Creative Journey  3rd Tuesday of the month 1130am-1pm, Journey Room

## 2018-02-23 DIAGNOSIS — M47812 Spondylosis without myelopathy or radiculopathy, cervical region: Secondary | ICD-10-CM | POA: Diagnosis not present

## 2018-02-28 ENCOUNTER — Encounter (HOSPITAL_COMMUNITY): Payer: Self-pay

## 2018-02-28 ENCOUNTER — Inpatient Hospital Stay (HOSPITAL_COMMUNITY): Payer: Medicare HMO

## 2018-02-28 VITALS — BP 151/69 | HR 67 | Temp 98.0°F | Resp 18 | Wt 185.8 lb

## 2018-02-28 DIAGNOSIS — Z7189 Other specified counseling: Secondary | ICD-10-CM

## 2018-02-28 DIAGNOSIS — C3490 Malignant neoplasm of unspecified part of unspecified bronchus or lung: Secondary | ICD-10-CM

## 2018-02-28 DIAGNOSIS — Z5111 Encounter for antineoplastic chemotherapy: Secondary | ICD-10-CM | POA: Diagnosis not present

## 2018-02-28 LAB — COMPREHENSIVE METABOLIC PANEL
ALT: 18 U/L (ref 17–63)
AST: 19 U/L (ref 15–41)
Albumin: 3.8 g/dL (ref 3.5–5.0)
Alkaline Phosphatase: 74 U/L (ref 38–126)
Anion gap: 6 (ref 5–15)
BILIRUBIN TOTAL: 0.6 mg/dL (ref 0.3–1.2)
BUN: 20 mg/dL (ref 6–20)
CHLORIDE: 104 mmol/L (ref 101–111)
CO2: 25 mmol/L (ref 22–32)
CREATININE: 0.88 mg/dL (ref 0.61–1.24)
Calcium: 8.8 mg/dL — ABNORMAL LOW (ref 8.9–10.3)
GFR calc Af Amer: >60 mL/min (ref >60)
Glucose, Bld: 136 mg/dL — ABNORMAL HIGH (ref 65–99)
Potassium: 4.3 mmol/L (ref 3.5–5.1)
Sodium: 135 mmol/L (ref 135–145)
Total Protein: 6.6 g/dL (ref 6.5–8.1)

## 2018-02-28 MED ORDER — HEPARIN SOD (PORK) LOCK FLUSH 100 UNIT/ML IV SOLN
500.0000 [IU] | Freq: Once | INTRAVENOUS | Status: AC | PRN
  Administered 2018-02-28: 500 [IU]

## 2018-02-28 MED ORDER — SODIUM CHLORIDE 0.9 % IV SOLN
1200.0000 mg | Freq: Once | INTRAVENOUS | Status: AC
  Administered 2018-02-28: 1200 mg via INTRAVENOUS
  Filled 2018-02-28: qty 20

## 2018-02-28 MED ORDER — SODIUM CHLORIDE 0.9 % IV SOLN
Freq: Once | INTRAVENOUS | Status: AC
  Administered 2018-02-28: 12:00:00 via INTRAVENOUS

## 2018-02-28 MED ORDER — SODIUM CHLORIDE 0.9% FLUSH
10.0000 mL | INTRAVENOUS | Status: DC | PRN
  Administered 2018-02-28: 10 mL
  Filled 2018-02-28: qty 10

## 2018-02-28 NOTE — Progress Notes (Signed)
1150 Labs from 02/22/18 and pt's assessment of tick bite last week reviewed with Dr. Delton Coombes and pt approved for Tecentriq infusion today per MD          [name] [name] tolerated Tecentriq infusion well without complaints or incident. VSS upon discharge. Pt discharged self ambulatory in satisfactory condition accompanied by his wife

## 2018-02-28 NOTE — Patient Instructions (Signed)
[facility] Discharge Instructions for Patients Receiving Chemotherapy   Beginning January 23rd 2017 lab work for the [facility] will be done in the  Main lab at [facility] on 1st floor. If you have a lab appointment with the [facility] please come in thru the  Main Entrance and check in at the main information desk   Today you received the following chemotherapy agents Tecentriq. Follow-up as scheduled. Call clinic for any questions or concerns  To help prevent nausea and vomiting after your treatment, we encourage you to take your nausea medication   If you develop nausea and vomiting, or diarrhea that is not controlled by your medication, call the clinic.  The clinic phone number is [phone]. Office hours are Monday-Friday 8:30am-5:00pm.  BELOW ARE SYMPTOMS THAT SHOULD BE REPORTED IMMEDIATELY:  *FEVER GREATER THAN 101.0 F  *CHILLS WITH OR WITHOUT FEVER  NAUSEA AND VOMITING THAT IS NOT CONTROLLED WITH YOUR NAUSEA MEDICATION  *UNUSUAL SHORTNESS OF BREATH  *UNUSUAL BRUISING OR BLEEDING  TENDERNESS IN MOUTH AND THROAT WITH OR WITHOUT PRESENCE OF ULCERS  *URINARY PROBLEMS  *BOWEL PROBLEMS  UNUSUAL RASH Items with * indicate a potential emergency and should be followed up as soon as possible. If you have an emergency after office hours please contact your primary care physician or go to the nearest emergency department.  Please call the clinic during office hours if you have any questions or concerns.   You may also contact the Patient Navigator at [phone] should you have any questions or need assistance in obtaining follow up care.      Resources For Cancer Patients and their Caregivers ? American Cancer Society: Can assist with transportation, wigs, general needs, runs Look Good Feel Better.        (612) 709-5743 ? Cancer Care: Provides financial assistance, online support groups, medication/co-pay assistance.  1-800-813-HOPE  (561)404-0556) ? [place] Assists [place] cancer patients and their families through emotional , educational and financial support.  902-879-4675 ? [place] DSS Where to apply for food stamps, Medicaid and utility assistance. (901)767-8670 ? [facility]: Transportation to medical appointments. 914 817 8076 ? Social Security Administration: May apply for disability if have a Stage IV cancer. [phone] ? [employer], Disability and Transit Services: Assists with nutrition, care and transit needs. 727-104-7732

## 2018-03-02 ENCOUNTER — Other Ambulatory Visit (HOSPITAL_COMMUNITY): Payer: Medicare HMO

## 2018-03-02 ENCOUNTER — Ambulatory Visit (HOSPITAL_COMMUNITY): Payer: Medicare HMO

## 2018-03-02 ENCOUNTER — Encounter (HOSPITAL_COMMUNITY): Payer: Self-pay

## 2018-03-02 ENCOUNTER — Inpatient Hospital Stay (HOSPITAL_COMMUNITY): Payer: Medicare HMO

## 2018-03-02 ENCOUNTER — Other Ambulatory Visit: Payer: Self-pay

## 2018-03-02 VITALS — BP 155/74 | HR 67 | Temp 98.3°F | Resp 18

## 2018-03-02 DIAGNOSIS — C7951 Secondary malignant neoplasm of bone: Secondary | ICD-10-CM

## 2018-03-02 DIAGNOSIS — Z5111 Encounter for antineoplastic chemotherapy: Secondary | ICD-10-CM | POA: Diagnosis not present

## 2018-03-02 DIAGNOSIS — C3490 Malignant neoplasm of unspecified part of unspecified bronchus or lung: Secondary | ICD-10-CM

## 2018-03-02 MED ORDER — DENOSUMAB 120 MG/1.7ML ~~LOC~~ SOLN
120.0000 mg | Freq: Once | SUBCUTANEOUS | Status: AC
  Administered 2018-03-02: 120 mg via SUBCUTANEOUS
  Filled 2018-03-02: qty 1.7

## 2018-03-02 NOTE — Progress Notes (Signed)
[name] [name] presents today for injection per MD orders. xgeva 120 mcgadministered SQ in right arm Administration without incident. Patient tolerated well.   Patient tolerated it well without problems. Vitals stable and discharged home from clinic ambulatory. Follow up as scheduled.

## 2018-03-02 NOTE — Patient Instructions (Signed)
[place] at [facility] Discharge Instructions  xgeva givne today.  Follow up as scheduled.   Thank you for choosing [place] at [facility] to provide your oncology and hematology care.  To afford each patient quality time with our provider, please arrive at least 15 minutes before your scheduled appointment time.   If you have a lab appointment with the [facility] please come in thru the  Main Entrance and check in at the main information desk  You need to re-schedule your appointment should you arrive 10 or more minutes late.  We strive to give you quality time with our providers, and arriving late affects you and other patients whose appointments are after yours.  Also, if you no show three or more times for appointments you may be dismissed from the clinic at the providers discretion.     Again, thank you for choosing [facility].  Our hope is that these requests will decrease the amount of time that you wait before being seen by our physicians.       _____________________________________________________________  Should you have questions after your visit to [facility], please contact our office at [phone] between the hours of 8:30 a.m. and 4:30 p.m.  Voicemails left after 4:30 p.m. will not be returned until the following business day.  For prescription refill requests, have your pharmacy contact our office.       Resources For Cancer Patients and their Caregivers ? American Cancer Society: Can assist with transportation, wigs, general needs, runs Look Good Feel Better.        5020146180 ? Cancer Care: Provides financial assistance, online support groups, medication/co-pay assistance.  1-800-813-HOPE 202 442 8893) ? [place] Assists [place] cancer patients and their families through emotional , educational and financial support.  908-671-1060 ? [place]  DSS Where to apply for food stamps, Medicaid and utility assistance. 807 845 5817 ? [employer]: Transportation to medical appointments. (351)391-8223 ? Social Security Administration: May apply for disability if have a Stage IV cancer. 415 427 2999 (340) 660-4232 ? [employer], Disability and Transit Services: Assists with nutrition, care and transit needs. [place] Support Programs:   > Cancer Support Group  2nd Tuesday of the month 1pm-2pm, Journey Room   > Creative Journey  3rd Tuesday of the month 1130am-1pm, Journey Room

## 2018-03-06 DIAGNOSIS — G4733 Obstructive sleep apnea (adult) (pediatric): Secondary | ICD-10-CM | POA: Diagnosis not present

## 2018-03-07 ENCOUNTER — Telehealth (HOSPITAL_COMMUNITY): Payer: Self-pay

## 2018-03-07 ENCOUNTER — Other Ambulatory Visit (HOSPITAL_COMMUNITY): Payer: Self-pay

## 2018-03-07 NOTE — Telephone Encounter (Signed)
Patient called complaining of increased SOB and a rash on his back, stomach, legs, arms just like last time he got the xgeva injection.He did receive the xgeva injection on June the 21st. He is taking benadryl but its not helping. Pateint denied any signs of a tick bite. He wants to know if he can get something different from now on. Will talk to Dr. Delton Coombes and call patient back.   Called patient back to let him know I spoke with Dr. Delton Coombes and he will discuss this more at his next office visit. Also called him in a medrol dose pack per MD.

## 2018-03-08 ENCOUNTER — Other Ambulatory Visit (HOSPITAL_COMMUNITY): Payer: Medicare HMO

## 2018-03-08 ENCOUNTER — Ambulatory Visit (HOSPITAL_COMMUNITY): Payer: Medicare HMO

## 2018-03-12 ENCOUNTER — Ambulatory Visit (HOSPITAL_COMMUNITY): Payer: Medicare HMO | Admitting: Hematology

## 2018-03-12 ENCOUNTER — Other Ambulatory Visit (HOSPITAL_COMMUNITY): Payer: Medicare HMO

## 2018-03-12 ENCOUNTER — Ambulatory Visit (HOSPITAL_COMMUNITY): Payer: Medicare HMO

## 2018-03-13 ENCOUNTER — Ambulatory Visit (HOSPITAL_COMMUNITY): Payer: Medicare HMO

## 2018-03-14 ENCOUNTER — Ambulatory Visit (HOSPITAL_COMMUNITY): Payer: Medicare HMO

## 2018-03-16 ENCOUNTER — Other Ambulatory Visit (HOSPITAL_COMMUNITY): Payer: Self-pay | Admitting: *Deleted

## 2018-03-16 MED ORDER — PREDNISONE 50 MG PO TABS: ORAL_TABLET | ORAL | 0 refills | Status: DC

## 2018-03-16 NOTE — Telephone Encounter (Signed)
Patient presented to the office today with complaints of rash.  He has red raised whelps on bilateral legs, chest, abdomen and back.  Patient states that he finished his prednisone tape pack on Tuesday this week and the rash has not gotten any better.  He states that it is itching and benadryl only helps for a short period of time.  I spoke with Dr. Delton Coombes who was at bedside and assessed rash.  Orders received and sent to pharmacy.   Patient will follow up next week as previously scheduled.

## 2018-03-17 DIAGNOSIS — G4733 Obstructive sleep apnea (adult) (pediatric): Secondary | ICD-10-CM | POA: Diagnosis not present

## 2018-03-19 ENCOUNTER — Ambulatory Visit (HOSPITAL_COMMUNITY): Payer: Medicare HMO | Admitting: Hematology

## 2018-03-19 ENCOUNTER — Ambulatory Visit (HOSPITAL_COMMUNITY): Payer: Medicare HMO

## 2018-03-19 ENCOUNTER — Other Ambulatory Visit (HOSPITAL_COMMUNITY): Payer: Medicare HMO

## 2018-03-20 ENCOUNTER — Other Ambulatory Visit (HOSPITAL_COMMUNITY): Payer: Self-pay

## 2018-03-20 DIAGNOSIS — C3490 Malignant neoplasm of unspecified part of unspecified bronchus or lung: Secondary | ICD-10-CM

## 2018-03-20 DIAGNOSIS — C7951 Secondary malignant neoplasm of bone: Secondary | ICD-10-CM

## 2018-03-22 ENCOUNTER — Inpatient Hospital Stay (HOSPITAL_BASED_OUTPATIENT_CLINIC_OR_DEPARTMENT_OTHER): Payer: Medicare HMO | Admitting: Hematology

## 2018-03-22 ENCOUNTER — Telehealth (HOSPITAL_COMMUNITY): Payer: Self-pay | Admitting: Nurse Practitioner

## 2018-03-22 ENCOUNTER — Inpatient Hospital Stay (HOSPITAL_COMMUNITY): Payer: Medicare HMO | Attending: Hematology

## 2018-03-22 ENCOUNTER — Other Ambulatory Visit (HOSPITAL_COMMUNITY): Payer: Self-pay | Admitting: Internal Medicine

## 2018-03-22 ENCOUNTER — Inpatient Hospital Stay (HOSPITAL_COMMUNITY): Payer: Medicare HMO

## 2018-03-22 ENCOUNTER — Other Ambulatory Visit: Payer: Self-pay

## 2018-03-22 ENCOUNTER — Encounter (HOSPITAL_COMMUNITY): Payer: Self-pay | Admitting: Hematology

## 2018-03-22 ENCOUNTER — Ambulatory Visit (HOSPITAL_COMMUNITY)
Admission: RE | Admit: 2018-03-22 | Discharge: 2018-03-22 | Disposition: A | Discharge status: Home / Self Care | Payer: Medicare HMO | Source: Ambulatory Visit | Attending: Internal Medicine | Admitting: Internal Medicine

## 2018-03-22 VITALS — BP 146/74 | HR 56 | Temp 97.8°F | Resp 18

## 2018-03-22 DIAGNOSIS — R21 Rash and other nonspecific skin eruption: Secondary | ICD-10-CM | POA: Insufficient documentation

## 2018-03-22 DIAGNOSIS — Z5111 Encounter for antineoplastic chemotherapy: Secondary | ICD-10-CM | POA: Insufficient documentation

## 2018-03-22 DIAGNOSIS — Z79899 Other long term (current) drug therapy: Secondary | ICD-10-CM | POA: Insufficient documentation

## 2018-03-22 DIAGNOSIS — T50905A Adverse effect of unspecified drugs, medicaments and biological substances, initial encounter: Secondary | ICD-10-CM | POA: Diagnosis not present

## 2018-03-22 DIAGNOSIS — R0602 Shortness of breath: Secondary | ICD-10-CM

## 2018-03-22 DIAGNOSIS — R079 Chest pain, unspecified: Secondary | ICD-10-CM | POA: Diagnosis not present

## 2018-03-22 DIAGNOSIS — L298 Other pruritus: Secondary | ICD-10-CM

## 2018-03-22 DIAGNOSIS — C3490 Malignant neoplasm of unspecified part of unspecified bronchus or lung: Secondary | ICD-10-CM

## 2018-03-22 DIAGNOSIS — I10 Essential (primary) hypertension: Secondary | ICD-10-CM | POA: Diagnosis not present

## 2018-03-22 DIAGNOSIS — Z6828 Body mass index (BMI) 28.0-28.9, adult: Secondary | ICD-10-CM | POA: Diagnosis not present

## 2018-03-22 DIAGNOSIS — J449 Chronic obstructive pulmonary disease, unspecified: Secondary | ICD-10-CM | POA: Diagnosis not present

## 2018-03-22 DIAGNOSIS — C7951 Secondary malignant neoplasm of bone: Secondary | ICD-10-CM

## 2018-03-22 DIAGNOSIS — E782 Mixed hyperlipidemia: Secondary | ICD-10-CM | POA: Diagnosis not present

## 2018-03-22 DIAGNOSIS — Z7189 Other specified counseling: Secondary | ICD-10-CM

## 2018-03-22 LAB — COMPREHENSIVE METABOLIC PANEL
ALBUMIN: 3.7 g/dL (ref 3.5–5.0)
ALT: 23 U/L (ref 0–44)
ANION GAP: 8 (ref 5–15)
AST: 20 U/L (ref 15–41)
Alkaline Phosphatase: 54 U/L (ref 38–126)
BILIRUBIN TOTAL: 0.6 mg/dL (ref 0.3–1.2)
BUN: 21 mg/dL — ABNORMAL HIGH (ref 6–20)
CALCIUM: 8.7 mg/dL — AB (ref 8.9–10.3)
CO2: 24 mmol/L (ref 22–32)
Chloride: 106 mmol/L (ref 98–111)
Creatinine, Ser: 1 mg/dL (ref 0.61–1.24)
GFR calc Af Amer: >60 mL/min (ref >60)
GFR calc non Af Amer: >60 mL/min (ref >60)
GLUCOSE: 139 mg/dL — AB (ref 70–99)
POTASSIUM: 4.3 mmol/L (ref 3.5–5.1)
SODIUM: 138 mmol/L (ref 135–145)
TOTAL PROTEIN: 6.5 g/dL (ref 6.5–8.1)

## 2018-03-22 LAB — CBC WITH DIFFERENTIAL/PLATELET
BASOS PCT: 1 %
Basophils Absolute: 0 10*3/uL (ref 0.0–0.1)
Eosinophils Absolute: 0.5 10*3/uL (ref 0.0–0.7)
Eosinophils Relative: 6 %
HCT: 40.4 % (ref 39.0–52.0)
Hemoglobin: 13 g/dL (ref 13.0–17.0)
LYMPHS ABS: 1.3 10*3/uL (ref 0.7–4.0)
Lymphocytes Relative: 17 %
MCH: 32.8 pg (ref 26.0–34.0)
MCHC: 32.2 g/dL (ref 30.0–36.0)
MCV: 102 fL — ABNORMAL HIGH (ref 78.0–100.0)
MONO ABS: 0.5 10*3/uL (ref 0.1–1.0)
MONOS PCT: 6 %
Neutro Abs: 5.5 10*3/uL (ref 1.7–7.7)
Neutrophils Relative %: 70 %
Platelets: 165 10*3/uL (ref 150–400)
RBC: 3.96 MIL/uL — ABNORMAL LOW (ref 4.22–5.81)
RDW: 13.7 % (ref 11.5–15.5)
WBC: 7.8 10*3/uL (ref 4.0–10.5)

## 2018-03-22 LAB — TSH: TSH: 1.503 u[IU]/mL (ref 0.350–4.500)

## 2018-03-22 MED ORDER — MIRTAZAPINE 7.5 MG PO TABS: 7.5000 mg | ORAL_TABLET | Freq: Every day | ORAL | 1 refills | Status: DC

## 2018-03-22 MED ORDER — SODIUM CHLORIDE 0.9 % IV SOLN
Freq: Once | INTRAVENOUS | Status: AC
  Administered 2018-03-22: 10:00:00 via INTRAVENOUS

## 2018-03-22 MED ORDER — HEPARIN SOD (PORK) LOCK FLUSH 100 UNIT/ML IV SOLN
500.0000 [IU] | Freq: Once | INTRAVENOUS | Status: AC | PRN
  Administered 2018-03-22: 500 [IU]

## 2018-03-22 MED ORDER — SODIUM CHLORIDE 0.9% FLUSH
10.0000 mL | INTRAVENOUS | Status: DC | PRN
Start: 2018-03-22 — End: 2018-03-22
  Administered 2018-03-22: 10 mL
  Filled 2018-03-22: qty 10

## 2018-03-22 MED ORDER — SODIUM CHLORIDE 0.9 % IV SOLN
1200.0000 mg | Freq: Once | INTRAVENOUS | Status: AC
  Administered 2018-03-22: 1200 mg via INTRAVENOUS
  Filled 2018-03-22: qty 20

## 2018-03-22 NOTE — Patient Instructions (Signed)
[facility] Discharge Instructions for Patients Receiving Chemotherapy  Today you received the following chemotherapy agents tecentriq.    If you develop nausea and vomiting that is not controlled by your nausea medication, call the clinic.   BELOW ARE SYMPTOMS THAT SHOULD BE REPORTED IMMEDIATELY:  *FEVER GREATER THAN 100.5 F  *CHILLS WITH OR WITHOUT FEVER  NAUSEA AND VOMITING THAT IS NOT CONTROLLED WITH YOUR NAUSEA MEDICATION  *UNUSUAL SHORTNESS OF BREATH  *UNUSUAL BRUISING OR BLEEDING  TENDERNESS IN MOUTH AND THROAT WITH OR WITHOUT PRESENCE OF ULCERS  *URINARY PROBLEMS  *BOWEL PROBLEMS  UNUSUAL RASH Items with * indicate a potential emergency and should be followed up as soon as possible.  Feel free to call the clinic should you have any questions or concerns. The clinic phone number is [phone].  Please show the [place] at check-in to the Emergency Department and triage nurse.

## 2018-03-22 NOTE — Assessment & Plan Note (Signed)
1.  Extensive stage small cell lung cancer: -4 cycles of carboplatin, etoposide and Atezolizumab from 11/27/2017 through 01/29/2018 -CT scan of the chest, abdomen after 3 cycles of chemo immunotherapy on 01/25/2018 shows near complete resolution of the adenopathy and lung mass.  Sclerosis and bone mets noted.  - Maintenance Atezolizumab started on 02/28/2018 -Today his blood counts are adequate to proceed with his next cycle of Atezolizumab.  I plan to repeat CT scans of the chest, abdomen and pelvis after the cycle prior to next visit in 3 weeks.  We will also do MRI of the brain for surveillance.  2. Bone metastasis: - He was started on denosumab on 02/02/2018.  Received second dose on 03/02/2018.  Both times he developed urticarial skin rash, predominantly on his trunk and lower extremities.  He was treated with Medrol Dosepak initially.  He had to receive prednisone 50 mg for 5 days on 03/16/2018.  It helped with the rash.  He still has some rash.  Hence I would discontinue denosumab.  We will obtain insurance prior authorization for zoledronic acid.  3.  Skin rash: - Timing correlates with the administration of denosumab.  Hence we have discontinued denosumab.  He is taking Benadryl up to 6 tablets daily for his itching.  Still it does not well controlled.  We have asked him to take H2 blocker in the form of Zantac.  We will also give him prescription for mirtazapine 7.5 mg daily.

## 2018-03-22 NOTE — Progress Notes (Signed)
[place], [place] [address]   CLINIC:  Medical Oncology/Hematology  PCP:  [facility], [place] [address] [phone]   REASON FOR VISIT:  Follow-up for exstensive stage small cell lung cancer  CURRENT THERAPY: Atezolizumab every 3 weeks.  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 Matlock 57 y.o. male returns for routine follow-up for small cell lung cancer and consideration for next cycle of chemotherapy. Patient is here today with his wife. Patient still has rash over truck and upper legs. The rash is not as red than last week. Patient completed his dose of prednisone and continues to take benadryl for the itching.  He reports some shortness of breath on exertion. His wife tells [place] he is jumbling his words more and has times where he is confused. This is not new for him but seems to be getting worse. He continues to have the numbness and tingling in his feet and toes but is stable and has not gotten worse. His energy levels are at 50% and his appetite has improved greatly and is at 100%.     REVIEW OF SYSTEMS:  Review of Systems  Constitutional: Positive for fatigue.    HENT:   Positive for trouble swallowing.   Eyes: Negative.   Respiratory: Positive for shortness of breath.   Cardiovascular: Positive for leg swelling.  Gastrointestinal: Negative.   Endocrine: Negative.   Genitourinary: Negative.    Skin: Positive for itching and rash.  Neurological: Positive for extremity weakness and numbness.  Hematological: Bruises/bleeds easily.  Psychiatric/Behavioral: Positive for confusion.     PAST MEDICAL/SURGICAL HISTORY:  Past Medical History:  Diagnosis Date  . Anxiety   . Asthma   . COPD (chronic obstructive pulmonary disease) ([place])   . DDD (degenerative disc disease)   . Diverticulitis   . GERD (gastroesophageal reflux disease)   . Headache   . Hepatitis    in the late 70's   . HTN (hypertension)   . Mediastinal adenopathy   . Seizures ([place])    as of 2019 none for 20 years  . Sleep apnea    does not  use cpap  . Stroke [facility])    TIA - Feb. 2018   Past Surgical History:  Procedure Laterality Date  . CERVICAL DISC SURGERY     X4  . CHOLECYSTECTOMY    . COLONOSCOPY  08/24/2011   Procedure: COLONOSCOPY;  Surgeon: Daneil Dolin, MD;  Location: [facility] ENDO SUITE;  Service: Endoscopy;  Laterality: N/A;  7:30AM  . ELBOW SURGERY     right  . PORTACATH PLACEMENT Right 12/01/2017   Procedure: INSERTION PORT-A-CATH;  Surgeon: Aviva Signs, MD;  Location: [facility]  ORS;  Service: General;  Laterality: Right;  Marland Kitchen VIDEO BRONCHOSCOPY WITH ENDOBRONCHIAL ULTRASOUND N/A 10/31/2017   Procedure: VIDEO BRONCHOSCOPY WITH ENDOBRONCHIAL ULTRASOUND;  Surgeon: Ivin Poot, MD;  Location: [facility];  Service: Thoracic;  Laterality: N/A;  . VIDEO BRONCHOSCOPY WITH ENDOBRONCHIAL ULTRASOUND N/A 11/22/2017   Procedure: VIDEO BRONCHOSCOPY WITH ENDOBRONCHIAL ULTRASOUND;  Surgeon: Melrose Nakayama, MD;  Location: [facility];  Service: Thoracic;  Laterality: N/A;     SOCIAL HISTORY:  Social History   Socioeconomic History  . Marital status: Married    Spouse name: Not on  file  . Number of children: Not on file  . Years of education: Not on file  . Highest education level: Not on file  Occupational History  . Occupation: disability  Social Needs  . Financial resource strain: Not on file  . Food insecurity:    Worry: Not on file    Inability: Not on file  . Transportation needs:    Medical: Not on file    Non-medical: Not on file  Tobacco Use  . Smoking status: Former Smoker    Packs/day: 1.50    Years: 30.00    Pack years: 45.00    Types: Cigarettes    Last attempt to quit: 10/13/2017    Years since quitting: 0.4  . Smokeless tobacco: Never Used  Substance and Sexual Activity  . Alcohol use: No  . Drug use: No  . Sexual activity: Yes    Birth control/protection: None  Lifestyle  . Physical activity:    Days per week: Not on file    Minutes per session: Not on file  . Stress: Not on file  Relationships  . Social connections:    Talks on phone: Not on file    Gets together: Not on file    Attends religious service: Not on file    Active member of club or organization: Not on file    Attends meetings of clubs or organizations: Not on file    Relationship status: Not on file  . Intimate partner violence:    Fear of current or ex partner: Not on file    Emotionally abused: Not on file    Physically abused: Not on file    Forced sexual activity: Not on file  Other Topics Concern  . Not on file  Social History Narrative  . Not on file    FAMILY HISTORY:  Family History  Problem Relation Age of Onset  . Hypertension Father   . Heart attack Father   . Kidney failure Mother   . Diabetes Mother   . Hypertension Mother   . Colon cancer Neg Hx   . Anesthesia problems Neg Hx   . Hypotension Neg Hx   . Malignant hyperthermia Neg Hx   . Pseudochol deficiency Neg Hx     CURRENT MEDICATIONS:  Outpatient Encounter Medications as of 03/22/2018  Medication Sig Note  . albuterol (PROVENTIL HFA;VENTOLIN HFA) 108 (90 BASE) MCG/ACT inhaler  Inhale 2 puffs into the lungs every 6 (six) hours as needed for wheezing or shortness of breath.    Marland Kitchen albuterol (PROVENTIL) (2.5 MG/3ML) 0.083% nebulizer solution Take 2.5 mg by nebulization every 6 (six) hours as needed for wheezing or shortness of breath.    . alprazolam (XANAX) 2 MG tablet Take 2 mg by mouth every 6 (six) hours as needed for sleep or anxiety.    Marland Kitchen amLODipine (NORVASC) 10 MG tablet Take 10 mg by mouth daily.   Marland Kitchen  Atezolizumab (TECENTRIQ IV) Inject into the vein. Every 21 days   . azithromycin (ZITHROMAX) 250 MG tablet Take 2 tabs by mouth on day 1 then 1 tab daily by mouth until all taken.   . cyclobenzaprine (FLEXERIL) 10 MG tablet Take 10 mg by mouth daily as needed for muscle spasms.    . diclofenac sodium (VOLTAREN) 1 % GEL Apply 2 g topically 4 (four) times daily. Apply to left forearm phlebitic area   . docusate sodium (COLACE) 100 MG capsule Take 1 capsule (100 mg total) by mouth every 12 (twelve) hours.   . fluticasone (FLONASE) 50 MCG/ACT nasal spray Place 2 sprays into both nostrils daily as needed for allergies.    . Glucosamine-Chondroit-Vit C-Mn (GLUCOSAMINE 1500 COMPLEX) CAPS Take 1 capsule by mouth.   . levETIRAcetam (KEPPRA) 500 MG tablet Take 500 mg by mouth daily.    Marland Kitchen lidocaine (XYLOCAINE) 2 % solution    . lidocaine-prilocaine (EMLA) cream APPLY TO PORTACATH SITE AS DIRECTED   . lisinopril (PRINIVIL,ZESTRIL) 20 MG tablet Take 20 mg by mouth 2 (two) times daily.   . meclizine (ANTIVERT) 12.5 MG tablet Take 1 tablet (12.5 mg total) by mouth 3 (three) times daily as needed for dizziness.   . MULTIPLE VITAMINS PO Take 1 tablet by mouth.   . mupirocin ointment (BACTROBAN) 2 % APPLY TO SKIN TID PRN   . naproxen (NAPROSYN) 500 MG tablet Take 500 mg by mouth 2 (two) times daily with a meal.     . NONFORMULARY OR COMPOUNDED ITEM SWISH AND SWALLOW 15 MLS PO QID   . Omega-3 Fatty Acids (FISH OIL) 1000 MG CAPS Take 1,000 mg by mouth 2 (two) times daily.  10/26/2017:  Stopped for procedure  . oxyCODONE (ROXICODONE) 15 MG immediate release tablet Take 15 mg by mouth 4 (four) times daily as needed for pain.    . pantoprazole (PROTONIX) 40 MG tablet    . polyethylene glycol (MIRALAX / GLYCOLAX) packet Take 17 g by mouth daily.   . pravastatin (PRAVACHOL) 20 MG tablet    . tamsulosin (FLOMAX) 0.4 MG CAPS capsule Take 0.4 mg by mouth daily.    . temazepam (RESTORIL) 30 MG capsule TAKE 1 CAPSULE AT BEDTIME AS NEEDED FOR SLEEP   . TURMERIC PO Take 4 tablets by mouth.   . Turmeric POWD Take by mouth.   . [DISCONTINUED] methylPREDNISolone (MEDROL DOSEPAK) 4 MG TBPK tablet FPD   . [DISCONTINUED] predniSONE (DELTASONE) 50 MG tablet Take 1 tablet by mouth daily x 5 days   . mirtazapine (REMERON) 7.5 MG tablet Take 1 tablet (7.5 mg total) by mouth at bedtime.   . [DISCONTINUED] prochlorperazine (COMPAZINE) 10 MG tablet Take 1 tablet (10 mg total) by mouth every 6 (six) hours as needed (Nausea or vomiting).    No facility-administered encounter medications on file as of 03/22/2018.     ALLERGIES:  No Known Allergies   PHYSICAL EXAM:  ECOG Performance status: 1  VITAL SIGNS: BP 161/66, P: 62, R: 18, TEMP: 98.1, 02: 100%  Physical Exam   LABORATORY DATA:  I have reviewed the labs as listed.  CBC    Component Value Date/Time   WBC 7.8 03/22/2018 0908   RBC 3.96 (L) 03/22/2018 0908   HGB 13.0 03/22/2018 0908   HCT 40.4 03/22/2018 0908   PLT 165 03/22/2018 0908   MCV 102.0 (H) 03/22/2018 0908   MCH 32.8 03/22/2018 0908   MCHC 32.2 03/22/2018 0908   RDW 13.7 03/22/2018 0908  LYMPHSABS 1.3 03/22/2018 0908   MONOABS 0.5 03/22/2018 0908   EOSABS 0.5 03/22/2018 0908   BASOSABS 0.0 03/22/2018 0908   CMP Latest Ref Rng & Units 03/22/2018 02/28/2018 02/22/2018  Glucose 70 - 99 mg/dL 139(H) 136(H) 105(H)  BUN 6 - 20 mg/dL 21(H) 20 21(H)  Creatinine 0.61 - 1.24 mg/dL 1.00 0.88 0.88  Sodium 135 - 145 mmol/L 138 135 138  Potassium 3.5 - 5.1 mmol/L 4.3 4.3 4.2    Chloride 98 - 111 mmol/L 106 104 105  CO2 22 - 32 mmol/L 24 25 25   Calcium 8.9 - 10.3 mg/dL 8.7(L) 8.8(L) 9.8  Total Protein 6.5 - 8.1 g/dL 6.5 6.6 7.3  Total Bilirubin 0.3 - 1.2 mg/dL 0.6 0.6 0.5  Alkaline Phos 38 - 126 U/L 54 74 92  AST 15 - 41 U/L 20 19 20   ALT 0 - 44 U/L 23 18 18          ASSESSMENT & PLAN:   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  Extensive stage small cell lung cancer: -4 cycles of carboplatin, etoposide and Atezolizumab from 11/27/2017 through 01/29/2018 -CT scan of the chest, abdomen after 3 cycles of chemo immunotherapy on 01/25/2018 shows near complete resolution of the adenopathy and lung mass.  Sclerosis and bone mets noted.  - Maintenance Atezolizumab started on 02/28/2018 -Today his blood counts are adequate to proceed with his next cycle of Atezolizumab.  I plan to repeat CT scans of the chest, abdomen and pelvis after the cycle prior to next visit in 3 weeks.  We will also do MRI of the brain for surveillance.  2. Bone metastasis: - He was started on denosumab on 02/02/2018.  Received second dose on 03/02/2018.  Both times he developed urticarial skin rash, predominantly on his trunk and lower extremities.  He was treated with Medrol Dosepak initially.  He had to receive prednisone 50 mg for 5 days on 03/16/2018.  It helped with the rash.  He still has some rash.  Hence I would discontinue denosumab.  We will obtain insurance prior authorization for zoledronic acid.  3.  Skin rash: - Timing correlates with the administration of denosumab.  Hence we have discontinued denosumab.  He is taking Benadryl up to 6 tablets daily for his itching.  Still it does not well controlled.  We have asked him to take H2 blocker in the form of Zantac.  We will also give him prescription for mirtazapine 7.5 mg daily.    Addendum: I have received a phone call from Dr. Gerarda Fraction.  Mr. Saffran presented to his office with worsening shortness of breath.  Dr.  Gerarda Fraction wanted [place] to do a CT scan PE protocol.  We have ordered the scan per his request.    Orders placed this encounter:  Orders Placed This Encounter  Procedures  . MR Brain W Contrast  . CT Chest W Contrast  . CT Abdomen Pelvis W Contrast  . MR Brain W Wo Contrast  . Magnesium  . CBC with Differential/Platelet  . Comprehensive metabolic panel  . Lactate dehydrogenase  . TSH  . Magnesium      Derek Jack, MD [place] [phone]

## 2018-03-22 NOTE — Progress Notes (Signed)
Patient seen by oncologist.  Madaline Brilliant to treat today with tecentriq but holding xgeva.   Patient tolerated chemotherapy with no complaints voiced.  Port site clean and dry with no bruising or swelling noted at site.  Good blood return noted before and after administration of chemotherapy.  No complaints of pain with flush.  Band aid applied.  VSS with discharge and left ambulatory with no s/s of distress noted.  Family at side.

## 2018-03-23 ENCOUNTER — Other Ambulatory Visit (HOSPITAL_COMMUNITY): Payer: Self-pay | Admitting: Nurse Practitioner

## 2018-03-23 ENCOUNTER — Ambulatory Visit (HOSPITAL_COMMUNITY)
Admission: RE | Admit: 2018-03-23 | Discharge: 2018-03-23 | Disposition: A | Discharge status: Home / Self Care | Payer: Medicare HMO | Source: Ambulatory Visit | Attending: Nurse Practitioner | Admitting: Nurse Practitioner

## 2018-03-23 DIAGNOSIS — C3490 Malignant neoplasm of unspecified part of unspecified bronchus or lung: Secondary | ICD-10-CM

## 2018-03-23 DIAGNOSIS — C7951 Secondary malignant neoplasm of bone: Secondary | ICD-10-CM | POA: Diagnosis not present

## 2018-03-23 DIAGNOSIS — R0602 Shortness of breath: Secondary | ICD-10-CM | POA: Diagnosis not present

## 2018-03-23 MED ORDER — IOPAMIDOL (ISOVUE-370) INJECTION 76%
100.0000 mL | Freq: Once | INTRAVENOUS | Status: AC | PRN
  Administered 2018-03-23: 100 mL via INTRAVENOUS

## 2018-03-26 ENCOUNTER — Other Ambulatory Visit (HOSPITAL_COMMUNITY): Payer: Self-pay | Admitting: Hematology

## 2018-03-26 ENCOUNTER — Other Ambulatory Visit (HOSPITAL_COMMUNITY): Payer: Self-pay | Admitting: *Deleted

## 2018-03-26 DIAGNOSIS — T50905A Adverse effect of unspecified drugs, medicaments and biological substances, initial encounter: Principal | ICD-10-CM

## 2018-03-26 DIAGNOSIS — R21 Rash and other nonspecific skin eruption: Secondary | ICD-10-CM

## 2018-03-26 DIAGNOSIS — L298 Other pruritus: Secondary | ICD-10-CM

## 2018-03-26 DIAGNOSIS — C3490 Malignant neoplasm of unspecified part of unspecified bronchus or lung: Secondary | ICD-10-CM

## 2018-03-26 MED ORDER — PREDNISONE 20 MG PO TABS: ORAL_TABLET | ORAL | 0 refills | Status: DC

## 2018-03-28 ENCOUNTER — Other Ambulatory Visit (HOSPITAL_COMMUNITY): Payer: Self-pay | Admitting: *Deleted

## 2018-03-28 DIAGNOSIS — G4700 Insomnia, unspecified: Secondary | ICD-10-CM

## 2018-03-28 MED ORDER — TEMAZEPAM 30 MG PO CAPS: ORAL_CAPSULE | ORAL | 1 refills | Status: DC

## 2018-03-30 ENCOUNTER — Inpatient Hospital Stay (HOSPITAL_COMMUNITY): Payer: Medicare HMO

## 2018-03-30 ENCOUNTER — Encounter (HOSPITAL_COMMUNITY): Payer: Self-pay

## 2018-03-30 VITALS — BP 154/80 | HR 67 | Temp 98.2°F | Resp 18 | Wt 190.0 lb

## 2018-03-30 DIAGNOSIS — C7951 Secondary malignant neoplasm of bone: Secondary | ICD-10-CM

## 2018-03-30 DIAGNOSIS — C3490 Malignant neoplasm of unspecified part of unspecified bronchus or lung: Secondary | ICD-10-CM

## 2018-03-30 DIAGNOSIS — T50905A Adverse effect of unspecified drugs, medicaments and biological substances, initial encounter: Secondary | ICD-10-CM

## 2018-03-30 DIAGNOSIS — Z5111 Encounter for antineoplastic chemotherapy: Secondary | ICD-10-CM | POA: Diagnosis not present

## 2018-03-30 DIAGNOSIS — L298 Other pruritus: Secondary | ICD-10-CM

## 2018-03-30 DIAGNOSIS — R21 Rash and other nonspecific skin eruption: Secondary | ICD-10-CM

## 2018-03-30 LAB — COMPREHENSIVE METABOLIC PANEL
ALBUMIN: 3.8 g/dL (ref 3.5–5.0)
ALK PHOS: 56 U/L (ref 38–126)
ALT: 24 U/L (ref 0–44)
ANION GAP: 8 (ref 5–15)
AST: 16 U/L (ref 15–41)
BILIRUBIN TOTAL: 0.5 mg/dL (ref 0.3–1.2)
BUN: 24 mg/dL — AB (ref 6–20)
CALCIUM: 9.2 mg/dL (ref 8.9–10.3)
CO2: 28 mmol/L (ref 22–32)
Chloride: 102 mmol/L (ref 98–111)
Creatinine, Ser: 1.14 mg/dL (ref 0.61–1.24)
GFR calc Af Amer: >60 mL/min (ref >60)
GFR calc non Af Amer: >60 mL/min (ref >60)
Glucose, Bld: 115 mg/dL — ABNORMAL HIGH (ref 70–99)
Potassium: 3.8 mmol/L (ref 3.5–5.1)
SODIUM: 138 mmol/L (ref 135–145)
TOTAL PROTEIN: 6.7 g/dL (ref 6.5–8.1)

## 2018-03-30 LAB — CBC WITH DIFFERENTIAL/PLATELET
BASOS ABS: 0 10*3/uL (ref 0.0–0.1)
BASOS PCT: 0 %
EOS ABS: 0.1 10*3/uL (ref 0.0–0.7)
Eosinophils Relative: 1 %
HEMATOCRIT: 41.5 % (ref 39.0–52.0)
HEMOGLOBIN: 13.5 g/dL (ref 13.0–17.0)
Lymphocytes Relative: 14 %
Lymphs Abs: 1.4 10*3/uL (ref 0.7–4.0)
MCH: 32.4 pg (ref 26.0–34.0)
MCHC: 32.5 g/dL (ref 30.0–36.0)
MCV: 99.5 fL (ref 78.0–100.0)
Monocytes Absolute: 0.8 10*3/uL (ref 0.1–1.0)
Monocytes Relative: 8 %
NEUTROS ABS: 7.4 10*3/uL (ref 1.7–7.7)
NEUTROS PCT: 77 %
Platelets: 185 10*3/uL (ref 150–400)
RBC: 4.17 MIL/uL — AB (ref 4.22–5.81)
RDW: 13.1 % (ref 11.5–15.5)
WBC: 9.6 10*3/uL (ref 4.0–10.5)

## 2018-03-30 LAB — MAGNESIUM: Magnesium: 1.8 mg/dL (ref 1.7–2.4)

## 2018-03-30 MED ORDER — PREDNISONE 20 MG PO TABS: ORAL_TABLET | ORAL | 0 refills | Status: DC

## 2018-03-30 MED ORDER — SODIUM CHLORIDE 0.9% FLUSH
10.0000 mL | Freq: Once | INTRAVENOUS | Status: AC
  Administered 2018-03-30: 10 mL via INTRAVENOUS

## 2018-03-30 MED ORDER — SODIUM CHLORIDE 0.9 % IV SOLN
INTRAVENOUS | Status: DC
  Administered 2018-03-30: 10:00:00 via INTRAVENOUS

## 2018-03-30 MED ORDER — HEPARIN SOD (PORK) LOCK FLUSH 100 UNIT/ML IV SOLN
500.0000 [IU] | Freq: Once | INTRAVENOUS | Status: AC
  Administered 2018-03-30: 500 [IU] via INTRAVENOUS

## 2018-03-30 MED ORDER — ZOLEDRONIC ACID 4 MG/100ML IV SOLN
4.0000 mg | Freq: Once | INTRAVENOUS | Status: AC
  Administered 2018-03-30: 4 mg via INTRAVENOUS
  Filled 2018-03-30: qty 100

## 2018-03-30 NOTE — Progress Notes (Signed)
Patient stated he has been taking Prednisone 80mg  daily for 5 days for his history of rash and today was day 5 and was concerned if he should be tapering the medication.  Patient stated his rash was better and asked if he could have a steroid at home in case of another rash appeared.  Patients rash was better per the patient and areas observed were brown in color.  Patient denied tooth, jaw, or leg pain.  Consent signed for Zometa.    Reviewed the patients symptoms and request with Dr. Walden Field with verbal orders to continue the prednisone and to decrease it by 10mg  every 5 days.  Verbal orders to continue this until his next appointment with Dr. Delton Coombes. Reviewed the directions with the patient and wife with understanding verbalized.    New prescription with new directions sent to [facility] on [address]   Patient tolerated zometa with no complaints voiced.  Port site clean and dry with good blood return noted before and after administration of therapy.  Band aid applied.  VSS with discharge and left ambulatory with family.  No s/s of distress noted.

## 2018-03-30 NOTE — Patient Instructions (Signed)
[place] at [facility]  Discharge Instructions:  You received zometa today.  _______________________________________________________________  Thank you for choosing [place] at [facility] to provide your oncology and hematology care.  To afford each patient quality time with our providers, please arrive at least 15 minutes before your scheduled appointment.  You need to re-schedule your appointment if you arrive 10 or more minutes late.  We strive to give you quality time with our providers, and arriving late affects you and other patients whose appointments are after yours.  Also, if you no show three or more times for appointments you may be dismissed from the clinic.  Again, thank you for choosing [place] at [place] hope is that these requests will allow you access to exceptional care and in a timely manner. _______________________________________________________________  If you have questions after your visit, please contact our office at [phone] between the hours of 8:30 a.m. and 5:00 p.m. Voicemails left after 4:30 p.m. will not be returned until the following business day. _______________________________________________________________  For prescription refill requests, have your pharmacy contact our office. _______________________________________________________________  Recommendations made by the consultant and any test results will be sent to your referring physician. _______________________________________________________________

## 2018-04-04 ENCOUNTER — Ambulatory Visit (HOSPITAL_COMMUNITY)
Admission: RE | Admit: 2018-04-04 | Discharge: 2018-04-04 | Disposition: A | Discharge status: Home / Self Care | Payer: Medicare HMO | Source: Ambulatory Visit | Attending: Nurse Practitioner | Admitting: Nurse Practitioner

## 2018-04-04 DIAGNOSIS — C3490 Malignant neoplasm of unspecified part of unspecified bronchus or lung: Secondary | ICD-10-CM | POA: Diagnosis present

## 2018-04-04 DIAGNOSIS — R410 Disorientation, unspecified: Secondary | ICD-10-CM | POA: Diagnosis not present

## 2018-04-04 MED ORDER — GADOBENATE DIMEGLUMINE 529 MG/ML IV SOLN
18.0000 mL | Freq: Once | INTRAVENOUS | Status: AC | PRN
  Administered 2018-04-04: 18 mL via INTRAVENOUS

## 2018-04-06 ENCOUNTER — Ambulatory Visit (HOSPITAL_COMMUNITY)
Admission: RE | Admit: 2018-04-06 | Discharge: 2018-04-06 | Disposition: A | Discharge status: Home / Self Care | Payer: Medicare HMO | Source: Ambulatory Visit | Attending: Nurse Practitioner | Admitting: Nurse Practitioner

## 2018-04-06 DIAGNOSIS — M4854XA Collapsed vertebra, not elsewhere classified, thoracic region, initial encounter for fracture: Secondary | ICD-10-CM | POA: Diagnosis not present

## 2018-04-06 DIAGNOSIS — M8448XA Pathological fracture, other site, initial encounter for fracture: Secondary | ICD-10-CM | POA: Insufficient documentation

## 2018-04-06 DIAGNOSIS — C3490 Malignant neoplasm of unspecified part of unspecified bronchus or lung: Secondary | ICD-10-CM | POA: Diagnosis not present

## 2018-04-06 DIAGNOSIS — C7951 Secondary malignant neoplasm of bone: Secondary | ICD-10-CM | POA: Insufficient documentation

## 2018-04-06 MED ORDER — IOPAMIDOL (ISOVUE-300) INJECTION 61%
100.0000 mL | Freq: Once | INTRAVENOUS | Status: AC | PRN
  Administered 2018-04-06: 100 mL via INTRAVENOUS

## 2018-04-10 DIAGNOSIS — Z6828 Body mass index (BMI) 28.0-28.9, adult: Secondary | ICD-10-CM | POA: Diagnosis not present

## 2018-04-10 DIAGNOSIS — R609 Edema, unspecified: Secondary | ICD-10-CM | POA: Diagnosis not present

## 2018-04-10 DIAGNOSIS — R69 Illness, unspecified: Secondary | ICD-10-CM | POA: Diagnosis not present

## 2018-04-10 DIAGNOSIS — J449 Chronic obstructive pulmonary disease, unspecified: Secondary | ICD-10-CM | POA: Diagnosis not present

## 2018-04-10 DIAGNOSIS — L509 Urticaria, unspecified: Secondary | ICD-10-CM | POA: Diagnosis not present

## 2018-04-10 DIAGNOSIS — R21 Rash and other nonspecific skin eruption: Secondary | ICD-10-CM | POA: Diagnosis not present

## 2018-04-10 DIAGNOSIS — E663 Overweight: Secondary | ICD-10-CM | POA: Diagnosis not present

## 2018-04-10 DIAGNOSIS — I10 Essential (primary) hypertension: Secondary | ICD-10-CM | POA: Diagnosis not present

## 2018-04-12 ENCOUNTER — Encounter (HOSPITAL_COMMUNITY): Payer: Self-pay | Admitting: Lab

## 2018-04-12 ENCOUNTER — Inpatient Hospital Stay (HOSPITAL_COMMUNITY): Payer: Medicare HMO | Attending: Hematology

## 2018-04-12 ENCOUNTER — Inpatient Hospital Stay (HOSPITAL_COMMUNITY): Payer: Medicare HMO

## 2018-04-12 ENCOUNTER — Inpatient Hospital Stay (HOSPITAL_COMMUNITY): Payer: Medicare HMO | Admitting: Hematology

## 2018-04-12 ENCOUNTER — Encounter (HOSPITAL_COMMUNITY): Payer: Self-pay | Admitting: Hematology

## 2018-04-12 ENCOUNTER — Other Ambulatory Visit: Payer: Self-pay

## 2018-04-12 DIAGNOSIS — C7951 Secondary malignant neoplasm of bone: Secondary | ICD-10-CM | POA: Insufficient documentation

## 2018-04-12 DIAGNOSIS — Z5112 Encounter for antineoplastic immunotherapy: Secondary | ICD-10-CM | POA: Insufficient documentation

## 2018-04-12 DIAGNOSIS — C3492 Malignant neoplasm of unspecified part of left bronchus or lung: Secondary | ICD-10-CM | POA: Diagnosis not present

## 2018-04-12 DIAGNOSIS — Z87891 Personal history of nicotine dependence: Secondary | ICD-10-CM

## 2018-04-12 DIAGNOSIS — Z9221 Personal history of antineoplastic chemotherapy: Secondary | ICD-10-CM

## 2018-04-12 DIAGNOSIS — L509 Urticaria, unspecified: Secondary | ICD-10-CM | POA: Diagnosis not present

## 2018-04-12 DIAGNOSIS — C3490 Malignant neoplasm of unspecified part of unspecified bronchus or lung: Secondary | ICD-10-CM

## 2018-04-12 DIAGNOSIS — Z7952 Long term (current) use of systemic steroids: Secondary | ICD-10-CM | POA: Insufficient documentation

## 2018-04-12 DIAGNOSIS — Z79899 Other long term (current) drug therapy: Secondary | ICD-10-CM | POA: Insufficient documentation

## 2018-04-12 LAB — COMPREHENSIVE METABOLIC PANEL
ALT: 27 U/L (ref 0–44)
AST: 17 U/L (ref 15–41)
Albumin: 3.9 g/dL (ref 3.5–5.0)
Alkaline Phosphatase: 51 U/L (ref 38–126)
Anion gap: 7 (ref 5–15)
BUN: 28 mg/dL — AB (ref 6–20)
CO2: 29 mmol/L (ref 22–32)
CREATININE: 1.15 mg/dL (ref 0.61–1.24)
Calcium: 9.3 mg/dL (ref 8.9–10.3)
Chloride: 102 mmol/L (ref 98–111)
GFR calc Af Amer: >60 mL/min (ref >60)
Glucose, Bld: 131 mg/dL — ABNORMAL HIGH (ref 70–99)
Potassium: 4.5 mmol/L (ref 3.5–5.1)
Sodium: 138 mmol/L (ref 135–145)
TOTAL PROTEIN: 7 g/dL (ref 6.5–8.1)
Total Bilirubin: 0.6 mg/dL (ref 0.3–1.2)

## 2018-04-12 LAB — CBC WITH DIFFERENTIAL/PLATELET
Basophils Absolute: 0 10*3/uL (ref 0.0–0.1)
Basophils Relative: 0 %
Eosinophils Absolute: 0.1 10*3/uL (ref 0.0–0.7)
Eosinophils Relative: 0 %
HEMATOCRIT: 46.1 % (ref 39.0–52.0)
Hemoglobin: 15.3 g/dL (ref 13.0–17.0)
LYMPHS ABS: 0.7 10*3/uL (ref 0.7–4.0)
LYMPHS PCT: 6 %
MCH: 33.1 pg (ref 26.0–34.0)
MCHC: 33.2 g/dL (ref 30.0–36.0)
MCV: 99.8 fL (ref 78.0–100.0)
Monocytes Absolute: 0.4 10*3/uL (ref 0.1–1.0)
Monocytes Relative: 4 %
NEUTROS ABS: 10.8 10*3/uL — AB (ref 1.7–7.7)
NEUTROS PCT: 90 %
Platelets: 197 10*3/uL (ref 150–400)
RBC: 4.62 MIL/uL (ref 4.22–5.81)
RDW: 13.1 % (ref 11.5–15.5)
WBC: 12 10*3/uL — AB (ref 4.0–10.5)

## 2018-04-12 LAB — TSH: TSH: 0.735 u[IU]/mL (ref 0.350–4.500)

## 2018-04-12 LAB — LACTATE DEHYDROGENASE: LDH: 170 U/L (ref 98–192)

## 2018-04-12 LAB — MAGNESIUM: Magnesium: 2.1 mg/dL (ref 1.7–2.4)

## 2018-04-12 NOTE — Assessment & Plan Note (Signed)
1.  Extensive stage small cell lung cancer: -4 cycles of carboplatin, etoposide and Atezolizumab from 11/27/2017 through 01/29/2018 -CT scan of the chest, abdomen after 3 cycles of chemo immunotherapy on 01/25/2018 shows near complete resolution of the adenopathy and lung mass.  Sclerosis and bone mets noted.  - Maintenance Atezolizumab started on 02/28/2018, second dose on 03/22/2018, each time resulting in severe urticarial rash on the upper trunk and lower extremities, improved on steroid therapy. -He has gotten severe urticarial rash from Atezolizumab.  I have discussed the results of the CT scan of the chest, abdomen and pelvis dated 04/06/2018 which showed no evidence of disease progression.  Previously measured mediastinal lymph nodes are barely detectable.  Dominant left hilar lymph node measures 2.1 x 0.9 cm.  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  Mild T11 compression fracture with less than 10% height loss was also seen. -At this time I am reluctant to continue his immunotherapy.  He is on dexamethasone taper at this time. -I will send him for a second opinion with an oncologist who specializes in immunotherapy, Dr. Lula Olszewski at [facility].  I will see him back after that.  2. Bone metastasis: - He was started on denosumab on 02/02/2018.  Received second dose on 03/02/2018.  He got urticarial rash both times, which we thought was secondary to denosumab and discontinued it.  He was started on Zometa on 03/30/2018 and tolerated it very well.  He will continue Zometa every 4 weeks.  3.  Skin rash: - Timing correlated with denosumab and Atezolizumab administration.  However during last cycle, we did not give denosumab.  He still had severe urticarial rash on the trunk and lower extremities.  He is currently on dexamethasone 2 mg 3 times a day taper dose.

## 2018-04-12 NOTE — Patient Instructions (Signed)
[facility] at [facility] Discharge Instructions     Thank you for choosing  [facility] at [facility] to provide your oncology and hematology care.  To afford each patient quality time with our provider, please arrive at least 15 minutes before your scheduled appointment time.   If you have a lab appointment with the Cancer Center please come in thru the  Main Entrance and check in at the main information desk  You need to re-schedule your appointment should you arrive 10 or more minutes late.  We strive to give you quality time with our providers, and arriving late affects you and other patients whose appointments are after yours.  Also, if you no show three or more times for appointments you may be dismissed from the clinic at the providers discretion.     Again, thank you for choosing [facility].  Our hope is that these requests will decrease the amount of time that you wait before being seen by our physicians.       _____________________________________________________________  Should you have questions after your visit to [facility], please contact our office at [phone] between the hours of 8:00 a.m. and 4:30 p.m.  Voicemails left after 4:00 p.m. will not be returned until the following business day.  For prescription refill requests, have your pharmacy contact our office and allow 72 hours.    Cancer Center Support Programs:   > Cancer Support Group  2nd Tuesday of the month 1pm-2pm, Journey Room    

## 2018-04-12 NOTE — Progress Notes (Signed)
[facility],  [id number]   CLINIC:  Medical Oncology/Hematology  PCP:  Redmond School, MD [address] 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 male returns for routine follow-up for small cell lung cancer. Patient is here with his wife. His rash is gone. He has a small red rash where his tick bite was on the right groining. He feels it is from his tick bite and it has flared back up. Patient is having bilateral leg swelling and his primary care MD has put him on lasix daily. This may be from the high dose of steroids. He is also having lower back pain. His appetite remains at 100%. His energy levels are at 75%.   REVIEW OF SYSTEMS:  Review of Systems  Constitutional: Positive for fatigue.  HENT:  Negative.   Eyes: Negative.   Respiratory: Negative.   Cardiovascular: Positive for leg swelling.  Gastrointestinal: Negative.   Endocrine: Negative.   Genitourinary: Negative.    Skin:  Positive for itching.  Neurological: Positive for extremity weakness.  Hematological: Bruises/bleeds easily.  Psychiatric/Behavioral: Negative.      PAST MEDICAL/SURGICAL HISTORY:  Past Medical History:  Diagnosis Date  . Anxiety   . Asthma   . COPD (chronic obstructive pulmonary disease) ([place])   . DDD (degenerative disc disease)   . Diverticulitis   . GERD (gastroesophageal reflux disease)   . Headache   . Hepatitis    in the late 70's   . HTN (hypertension)   . Mediastinal adenopathy   . Seizures ([place])    as of 2019 none for 20 years  . Sleep apnea    does not  use cpap  . Stroke [facility])    TIA - Feb. 2018   Past Surgical History:  Procedure Laterality Date  . CERVICAL DISC SURGERY     X4  . CHOLECYSTECTOMY    . COLONOSCOPY  08/24/2011   Procedure: COLONOSCOPY;  Surgeon: Daneil Dolin, MD;  Location: [facility] ENDO SUITE;  Service: Endoscopy;  Laterality: N/A;  7:30AM  . ELBOW SURGERY     right  . PORTACATH PLACEMENT Right 12/01/2017   Procedure: INSERTION PORT-A-CATH;  Surgeon: Aviva Signs, MD;  Location: [facility] ORS;  Service: General;  Laterality: Right;  Marland Kitchen VIDEO BRONCHOSCOPY WITH ENDOBRONCHIAL ULTRASOUND N/A 10/31/2017   Procedure: VIDEO BRONCHOSCOPY WITH ENDOBRONCHIAL ULTRASOUND;  Surgeon: Ivin Poot, MD;  Location: [place];  Service: Thoracic;  Laterality: N/A;  . VIDEO BRONCHOSCOPY WITH ENDOBRONCHIAL ULTRASOUND N/A  11/22/2017   Procedure: VIDEO BRONCHOSCOPY WITH ENDOBRONCHIAL ULTRASOUND;  Surgeon: Melrose Nakayama, MD;  Location: [facility] OR;  Service: Thoracic;  Laterality: N/A;     SOCIAL HISTORY:  Social History   Socioeconomic History  . Marital status: Married    Spouse name: Not on file  . Number of children: Not on file  . Years of education: Not on file  . Highest education level: Not on file  Occupational History  . Occupation: disability  Social Needs  . Financial resource strain: Not on file  . Food insecurity:    Worry: Not on file    Inability:  Not on file  . Transportation needs:    Medical: Not on file    Non-medical: Not on file  Tobacco Use  . Smoking status: Former Smoker    Packs/day: 1.50    Years: 30.00    Pack years: 45.00    Types: Cigarettes    Last attempt to quit: 10/13/2017    Years since quitting: 0.4  . Smokeless tobacco: Never Used  Substance and Sexual Activity  . Alcohol use: No  . Drug use: No  . Sexual activity: Yes    Birth control/protection: None  Lifestyle  . Physical activity:    Days per week: Not on file    Minutes per session: Not on file  . Stress: Not on file  Relationships  . Social connections:    Talks on phone: Not on file    Gets together: Not on file    Attends religious service: Not on file    Active member of club or organization: Not on file    Attends meetings of clubs or organizations: Not on file    Relationship status: Not on file  . Intimate partner violence:    Fear of current or ex partner: Not on file    Emotionally abused: Not on file    Physically abused: Not on file    Forced sexual activity: Not on file  Other Topics Concern  . Not on file  Social History Narrative  . Not on file    FAMILY HISTORY:  Family History  Problem Relation Age of Onset  . Hypertension Father   . Heart attack Father   . Kidney failure Mother   . Diabetes Mother   . Hypertension Mother   . Colon cancer Neg Hx   . Anesthesia problems Neg Hx   . Hypotension Neg Hx   . Malignant hyperthermia Neg Hx   . Pseudochol deficiency Neg Hx     CURRENT MEDICATIONS:  Outpatient Encounter Medications as of 04/12/2018  Medication Sig Note  . albuterol (PROVENTIL HFA;VENTOLIN HFA) 108 (90 BASE) MCG/ACT inhaler Inhale 2 puffs into the lungs every 6 (six) hours as needed for wheezing or shortness of breath.    Marland Kitchen albuterol (PROVENTIL) (2.5 MG/3ML) 0.083% nebulizer solution Take 2.5 mg by nebulization every 6 (six) hours as needed for wheezing or shortness of breath.    . alprazolam (XANAX) 2 MG  tablet Take 2 mg by mouth every 6 (six) hours as needed for sleep or anxiety.    Marland Kitchen amLODipine (NORVASC) 10 MG tablet Take 10 mg by mouth daily.   Huey Bienenstock (TECENTRIQ IV) Inject into the vein. Every 21 days   . azithromycin (ZITHROMAX) 250 MG tablet Take 2 tabs by mouth on day 1 then 1 tab daily by mouth until all taken.   . cyclobenzaprine (FLEXERIL) 10 MG tablet Take 10 mg by mouth  daily as needed for muscle spasms.    Marland Kitchen dexamethasone (DECADRON) 2 MG tablet    . diclofenac sodium (VOLTAREN) 1 % GEL Apply 2 g topically 4 (four) times daily. Apply to left forearm phlebitic area   . docusate sodium (COLACE) 100 MG capsule Take 1 capsule (100 mg total) by mouth every 12 (twelve) hours.   . fluticasone (FLONASE) 50 MCG/ACT nasal spray Place 2 sprays into both nostrils daily as needed for allergies.    . furosemide (LASIX) 20 MG tablet    . Glucosamine-Chondroit-Vit C-Mn (GLUCOSAMINE 1500 COMPLEX) CAPS Take 1 capsule by mouth.   . levETIRAcetam (KEPPRA) 500 MG tablet Take 500 mg by mouth daily.    Marland Kitchen lidocaine (XYLOCAINE) 2 % solution    . lidocaine-prilocaine (EMLA) cream APPLY TO PORTACATH SITE AS DIRECTED   . lisinopril (PRINIVIL,ZESTRIL) 20 MG tablet Take 20 mg by mouth 2 (two) times daily.   . meclizine (ANTIVERT) 12.5 MG tablet Take 1 tablet (12.5 mg total) by mouth 3 (three) times daily as needed for dizziness.   . mirtazapine (REMERON) 7.5 MG tablet Take 1 tablet (7.5 mg total) by mouth at bedtime.   . MULTIPLE VITAMINS PO Take 1 tablet by mouth.   . mupirocin ointment (BACTROBAN) 2 % APPLY TO SKIN TID PRN   . naproxen (NAPROSYN) 500 MG tablet Take 500 mg by mouth 2 (two) times daily with a meal.     . NONFORMULARY OR COMPOUNDED ITEM SWISH AND SWALLOW 15 MLS PO QID   . Omega-3 Fatty Acids (FISH OIL) 1000 MG CAPS Take 1,000 mg by mouth 2 (two) times daily.  10/26/2017: Stopped for procedure  . oxyCODONE (ROXICODONE) 15 MG immediate release tablet Take 15 mg by mouth 4 (four) times daily  as needed for pain.    . pantoprazole (PROTONIX) 40 MG tablet    . polyethylene glycol (MIRALAX / GLYCOLAX) packet Take 17 g by mouth daily.   . pravastatin (PRAVACHOL) 20 MG tablet    . tamsulosin (FLOMAX) 0.4 MG CAPS capsule Take 0.4 mg by mouth daily.    . temazepam (RESTORIL) 30 MG capsule TAKE 1 CAPSULE AT BEDTIME AS NEEDED FOR SLEEP   . TURMERIC PO Take 4 tablets by mouth.   . Turmeric POWD Take by mouth.   . [DISCONTINUED] predniSONE (DELTASONE) 20 MG tablet Take Prednisone 70mg  by mouth for 5 days, then take  60mg  by mouth for 5 days, then take 50mg  by mouth for 5 days, then take 40mg  by mouth for 5 days.  Continue decreasing by 10mg  every 5 days until next appointment with oncologist.   . [DISCONTINUED] prochlorperazine (COMPAZINE) 10 MG tablet Take 1 tablet (10 mg total) by mouth every 6 (six) hours as needed (Nausea or vomiting).    No facility-administered encounter medications on file as of 04/12/2018.     ALLERGIES:  Allergies  Allergen Reactions  . Xgeva [Denosumab]     Rash      PHYSICAL EXAM:  ECOG Performance status: 1  Vitals:   04/12/18 1109  BP: 140/74  Pulse: 70  Resp: 16  Temp: 98.4 F (36.9 C)  SpO2: 99%   Filed Weights   04/12/18 1109  Weight: 192 lb 6.4 oz (87.3 kg)    Physical Exam  Constitutional: He is oriented to person, place, and time.  Cardiovascular: Normal rate, regular rhythm and normal heart sounds.  Pulmonary/Chest: Effort normal and breath sounds normal.  Neurological: He is alert and oriented to person, place, and  time.  Skin: Skin is warm and dry.     LABORATORY DATA:  I have reviewed the labs as listed.  CBC    Component Value Date/Time   WBC 12.0 (H) 04/12/2018 1011   RBC 4.62 04/12/2018 1011   HGB 15.3 04/12/2018 1011   HCT 46.1 04/12/2018 1011   PLT 197 04/12/2018 1011   MCV 99.8 04/12/2018 1011   MCH 33.1 04/12/2018 1011   MCHC 33.2 04/12/2018 1011   RDW 13.1 04/12/2018 1011   LYMPHSABS 0.7 04/12/2018 1011    MONOABS 0.4 04/12/2018 1011   EOSABS 0.1 04/12/2018 1011   BASOSABS 0.0 04/12/2018 1011   CMP Latest Ref Rng & Units 04/12/2018 03/30/2018 03/22/2018  Glucose 70 - 99 mg/dL 131(H) 115(H) 139(H)  BUN 6 - 20 mg/dL 28(H) 24(H) 21(H)  Creatinine 0.61 - 1.24 mg/dL 1.15 1.14 1.00  Sodium 135 - 145 mmol/L 138 138 138  Potassium 3.5 - 5.1 mmol/L 4.5 3.8 4.3  Chloride 98 - 111 mmol/L 102 102 106  CO2 22 - 32 mmol/L 29 28 24   Calcium 8.9 - 10.3 mg/dL 9.3 9.2 8.7(L)  Total Protein 6.5 - 8.1 g/dL 7.0 6.7 6.5  Total Bilirubin 0.3 - 1.2 mg/dL 0.6 0.5 0.6  Alkaline Phos 38 - 126 U/L 51 56 54  AST 15 - 41 U/L 17 16 20   ALT 0 - 44 U/L 27 24 23        DIAGNOSTIC IMAGING:  I have independently reviewed the images of his CT scan and reviewed it with the patient.     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  Extensive stage small cell lung cancer: -4 cycles of carboplatin, etoposide and Atezolizumab from 11/27/2017 through 01/29/2018 -CT scan of the chest, abdomen after 3 cycles of chemo immunotherapy on 01/25/2018 shows near complete resolution of the adenopathy and lung mass.  Sclerosis and bone mets noted.  - Maintenance Atezolizumab started on 02/28/2018, second dose on 03/22/2018, each time resulting in severe urticarial rash on the upper trunk and lower extremities, improved on steroid therapy. -He has gotten severe urticarial rash from Atezolizumab.  I have discussed the results of the CT scan of the chest, abdomen and pelvis dated 04/06/2018 which showed no evidence of disease progression.  Previously measured mediastinal lymph nodes are barely detectable.  Dominant left hilar lymph node measures 2.1 x 0.9 cm.  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  Mild T11 compression fracture with less than 10% height loss was also seen. -At this time I am reluctant to continue his immunotherapy.  He is on dexamethasone taper at this time. -I will send  him for a second opinion with an oncologist who specializes in immunotherapy, Dr. Lula Olszewski at [facility].  I will see him back after that.  2. Bone metastasis: - He was started on denosumab on 02/02/2018.  Received second dose on 03/02/2018.  He got urticarial rash both times, which we thought was secondary to denosumab and discontinued it.  He was started on Zometa on 03/30/2018 and tolerated it very well.  He will continue Zometa every 4 weeks.  3.  Skin rash: - Timing correlated with denosumab and Atezolizumab administration.  However during last cycle, we did not give denosumab.  He still had severe urticarial rash on the trunk and lower extremities.  He is currently on dexamethasone 2 mg 3 times a day taper dose.   Total time spent is  40 minutes with more than 50% of the time spent face-to-face discussing scan results, further plan and coordination of care.    Orders placed this encounter:  No orders of the defined types were placed in this encounter.     Derek Jack, MD [place] [phone]

## 2018-04-12 NOTE — Progress Notes (Signed)
Pt seen by Dr. Delton Coombes today and tx held at this time

## 2018-04-12 NOTE — Progress Notes (Unsigned)
Referral sent to [facility].  Spoke with [name] , after they get all the records they will contact pt. Records faxed on 8/1

## 2018-04-17 DIAGNOSIS — G4733 Obstructive sleep apnea (adult) (pediatric): Secondary | ICD-10-CM | POA: Diagnosis not present

## 2018-04-19 DIAGNOSIS — C7951 Secondary malignant neoplasm of bone: Secondary | ICD-10-CM | POA: Diagnosis not present

## 2018-04-19 DIAGNOSIS — C3490 Malignant neoplasm of unspecified part of unspecified bronchus or lung: Secondary | ICD-10-CM | POA: Diagnosis not present

## 2018-04-19 DIAGNOSIS — C787 Secondary malignant neoplasm of liver and intrahepatic bile duct: Secondary | ICD-10-CM | POA: Diagnosis not present

## 2018-04-19 DIAGNOSIS — D3610 Benign neoplasm of peripheral nerves and autonomic nervous system, unspecified: Secondary | ICD-10-CM | POA: Diagnosis not present

## 2018-04-19 DIAGNOSIS — D1801 Hemangioma of skin and subcutaneous tissue: Secondary | ICD-10-CM | POA: Diagnosis not present

## 2018-04-19 DIAGNOSIS — L270 Generalized skin eruption due to drugs and medicaments taken internally: Secondary | ICD-10-CM | POA: Diagnosis not present

## 2018-04-27 ENCOUNTER — Inpatient Hospital Stay (HOSPITAL_COMMUNITY): Payer: Medicare HMO

## 2018-04-27 ENCOUNTER — Encounter (HOSPITAL_COMMUNITY): Payer: Self-pay | Admitting: Hematology

## 2018-04-27 ENCOUNTER — Other Ambulatory Visit: Payer: Self-pay

## 2018-04-27 ENCOUNTER — Inpatient Hospital Stay (HOSPITAL_BASED_OUTPATIENT_CLINIC_OR_DEPARTMENT_OTHER): Payer: Medicare HMO | Admitting: Hematology

## 2018-04-27 ENCOUNTER — Encounter (HOSPITAL_COMMUNITY): Payer: Self-pay

## 2018-04-27 VITALS — BP 182/88 | HR 65 | Temp 97.5°F | Resp 18

## 2018-04-27 VITALS — BP 165/76 | HR 67 | Temp 97.7°F | Resp 18 | Wt 204.6 lb

## 2018-04-27 DIAGNOSIS — L509 Urticaria, unspecified: Secondary | ICD-10-CM | POA: Diagnosis not present

## 2018-04-27 DIAGNOSIS — C3490 Malignant neoplasm of unspecified part of unspecified bronchus or lung: Secondary | ICD-10-CM

## 2018-04-27 DIAGNOSIS — C7951 Secondary malignant neoplasm of bone: Secondary | ICD-10-CM

## 2018-04-27 DIAGNOSIS — Z87891 Personal history of nicotine dependence: Secondary | ICD-10-CM

## 2018-04-27 DIAGNOSIS — Z5112 Encounter for antineoplastic immunotherapy: Secondary | ICD-10-CM | POA: Diagnosis not present

## 2018-04-27 DIAGNOSIS — Z7952 Long term (current) use of systemic steroids: Secondary | ICD-10-CM

## 2018-04-27 DIAGNOSIS — C3492 Malignant neoplasm of unspecified part of left bronchus or lung: Secondary | ICD-10-CM

## 2018-04-27 DIAGNOSIS — Z79899 Other long term (current) drug therapy: Secondary | ICD-10-CM | POA: Diagnosis not present

## 2018-04-27 DIAGNOSIS — Z7189 Other specified counseling: Secondary | ICD-10-CM

## 2018-04-27 DIAGNOSIS — Z9221 Personal history of antineoplastic chemotherapy: Secondary | ICD-10-CM | POA: Diagnosis not present

## 2018-04-27 LAB — COMPREHENSIVE METABOLIC PANEL
ALK PHOS: 44 U/L (ref 38–126)
ALT: 40 U/L (ref 0–44)
ANION GAP: 7 (ref 5–15)
AST: 19 U/L (ref 15–41)
Albumin: 3.7 g/dL (ref 3.5–5.0)
BILIRUBIN TOTAL: 0.8 mg/dL (ref 0.3–1.2)
BUN: 24 mg/dL — ABNORMAL HIGH (ref 6–20)
CALCIUM: 8.8 mg/dL — AB (ref 8.9–10.3)
CO2: 30 mmol/L (ref 22–32)
Chloride: 101 mmol/L (ref 98–111)
Creatinine, Ser: 1.22 mg/dL (ref 0.61–1.24)
GFR calc non Af Amer: >60 mL/min (ref >60)
Glucose, Bld: 110 mg/dL — ABNORMAL HIGH (ref 70–99)
Potassium: 4.6 mmol/L (ref 3.5–5.1)
Sodium: 138 mmol/L (ref 135–145)
TOTAL PROTEIN: 6.4 g/dL — AB (ref 6.5–8.1)

## 2018-04-27 LAB — CBC WITH DIFFERENTIAL/PLATELET
Basophils Absolute: 0 10*3/uL (ref 0.0–0.1)
Basophils Relative: 0 %
Eosinophils Absolute: 0.1 10*3/uL (ref 0.0–0.7)
Eosinophils Relative: 1 %
HCT: 44 % (ref 39.0–52.0)
HEMOGLOBIN: 14 g/dL (ref 13.0–17.0)
LYMPHS ABS: 1.4 10*3/uL (ref 0.7–4.0)
Lymphocytes Relative: 20 %
MCH: 31.4 pg (ref 26.0–34.0)
MCHC: 31.8 g/dL (ref 30.0–36.0)
MCV: 98.7 fL (ref 78.0–100.0)
MONOS PCT: 5 %
Monocytes Absolute: 0.3 10*3/uL (ref 0.1–1.0)
NEUTROS ABS: 5.1 10*3/uL (ref 1.7–7.7)
NEUTROS PCT: 74 %
Platelets: 138 10*3/uL — ABNORMAL LOW (ref 150–400)
RBC: 4.46 MIL/uL (ref 4.22–5.81)
RDW: 13.4 % (ref 11.5–15.5)
WBC: 7 10*3/uL (ref 4.0–10.5)

## 2018-04-27 MED ORDER — SODIUM CHLORIDE 0.9 % IV SOLN
1200.0000 mg | Freq: Once | INTRAVENOUS | Status: AC
  Administered 2018-04-27: 1200 mg via INTRAVENOUS
  Filled 2018-04-27: qty 20

## 2018-04-27 MED ORDER — ZOLEDRONIC ACID 4 MG/100ML IV SOLN
4.0000 mg | Freq: Once | INTRAVENOUS | Status: AC
  Administered 2018-04-27: 4 mg via INTRAVENOUS
  Filled 2018-04-27: qty 100

## 2018-04-27 MED ORDER — SODIUM CHLORIDE 0.9% FLUSH
10.0000 mL | INTRAVENOUS | Status: DC | PRN
  Administered 2018-04-27: 10 mL
  Filled 2018-04-27: qty 10

## 2018-04-27 MED ORDER — SODIUM CHLORIDE 0.9 % IV SOLN
Freq: Once | INTRAVENOUS | Status: AC
  Administered 2018-04-27: 11:00:00 via INTRAVENOUS

## 2018-04-27 MED ORDER — PREDNISONE 10 MG PO TABS: 10.0000 mg | ORAL_TABLET | Freq: Every day | ORAL | 3 refills | Status: DC

## 2018-04-27 MED ORDER — HEPARIN SOD (PORK) LOCK FLUSH 100 UNIT/ML IV SOLN
500.0000 [IU] | Freq: Once | INTRAVENOUS | Status: AC | PRN
  Administered 2018-04-27: 500 [IU]

## 2018-04-27 NOTE — Progress Notes (Signed)
[place], [place] [address]   CLINIC:  Medical Oncology/Hematology  PCP:  Redmond School, MD [address] 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 Abernathy 57 y.o. male returns for routine follow-up for small cell lung cancer. Patient is here today with his wife. Patient reports he is only taking 10 mg of decadron. He states he is very aggitated and was very mean on the medication. He saw the Dermatologists in [place] and she wants to see him 5 days after treatment to assess the rash. Patient reports his appetite is 100% and he is maintaining his weight. His energy levels are 75%. He denies any rash at this time. Denies any new pains. Denies any hemoptysis. Denies cough.     REVIEW OF SYSTEMS:  Review of Systems  Constitutional: Positive for fatigue.  Neurological: Positive for dizziness, extremity weakness and numbness.  Hematological: Bruises/bleeds easily.  All other systems reviewed and are  negative.    PAST MEDICAL/SURGICAL HISTORY:  Past Medical History:  Diagnosis Date  . Anxiety   . Asthma   . COPD (chronic obstructive pulmonary disease) ([place])   . DDD (degenerative disc disease)   . Diverticulitis   . GERD (gastroesophageal reflux disease)   . Headache   . Hepatitis    in the late 70's   . HTN (hypertension)   . Mediastinal adenopathy   . Seizures ([place])    as of 2019 none for 20 years  . Sleep apnea    does not  use cpap  . Stroke [facility])    TIA - Feb. 2018   Past Surgical History:  Procedure Laterality Date  . CERVICAL DISC SURGERY     X4  . CHOLECYSTECTOMY    . COLONOSCOPY  08/24/2011   Procedure: COLONOSCOPY;  Surgeon: Daneil Dolin, MD;  Location: [facility];  Service: Endoscopy;  Laterality: N/A;  7:30AM  . ELBOW SURGERY     right  . PORTACATH PLACEMENT Right 12/01/2017   Procedure: INSERTION PORT-A-CATH;  Surgeon: Aviva Signs, MD;  Location: [facility] ORS;  Service: General;  Laterality: Right;  Marland Kitchen VIDEO BRONCHOSCOPY WITH ENDOBRONCHIAL ULTRASOUND N/A 10/31/2017   Procedure: VIDEO BRONCHOSCOPY WITH ENDOBRONCHIAL ULTRASOUND;  Surgeon: Ivin Poot, MD;  Location: [place];  Service: Thoracic;  Laterality: N/A;  . VIDEO BRONCHOSCOPY WITH ENDOBRONCHIAL ULTRASOUND N/A 11/22/2017   Procedure: VIDEO BRONCHOSCOPY WITH ENDOBRONCHIAL ULTRASOUND;  Surgeon: Melrose Nakayama, MD;  Location: [facility];  Service: Thoracic;  Laterality: N/A;     SOCIAL HISTORY:  Social  History   Socioeconomic History  . Marital status: Married    Spouse name: Not on file  . Number of children: Not on file  . Years of education: Not on file  . Highest education level: Not on file  Occupational History  . Occupation: disability  Social Needs  . Financial resource strain: Not on file  . Food insecurity:    Worry: Not on file    Inability: Not on file  . Transportation needs:    Medical: Not on file    Non-medical: Not on file  Tobacco Use  . Smoking status: Former Smoker     Packs/day: 1.50    Years: 30.00    Pack years: 45.00    Types: Cigarettes    Last attempt to quit: 10/13/2017    Years since quitting: 0.5  . Smokeless tobacco: Never Used  Substance and Sexual Activity  . Alcohol use: No  . Drug use: No  . Sexual activity: Yes    Birth control/protection: None  Lifestyle  . Physical activity:    Days per week: Not on file    Minutes per session: Not on file  . Stress: Not on file  Relationships  . Social connections:    Talks on phone: Not on file    Gets together: Not on file    Attends religious service: Not on file    Active member of club or organization: Not on file    Attends meetings of clubs or organizations: Not on file    Relationship status: Not on file  . Intimate partner violence:    Fear of current or ex partner: Not on file    Emotionally abused: Not on file    Physically abused: Not on file    Forced sexual activity: Not on file  Other Topics Concern  . Not on file  Social History Narrative  . Not on file    FAMILY HISTORY:  Family History  Problem Relation Age of Onset  . Hypertension Father   . Heart attack Father   . Kidney failure Mother   . Diabetes Mother   . Hypertension Mother   . Colon cancer Neg Hx   . Anesthesia problems Neg Hx   . Hypotension Neg Hx   . Malignant hyperthermia Neg Hx   . Pseudochol deficiency Neg Hx     CURRENT MEDICATIONS:  Outpatient Encounter Medications as of 04/27/2018  Medication Sig Note  . albuterol (PROVENTIL HFA;VENTOLIN HFA) 108 (90 BASE) MCG/ACT inhaler Inhale 2 puffs into the lungs every 6 (six) hours as needed for wheezing or shortness of breath.    Marland Kitchen albuterol (PROVENTIL) (2.5 MG/3ML) 0.083% nebulizer solution Take 2.5 mg by nebulization every 6 (six) hours as needed for wheezing or shortness of breath.    . alprazolam (XANAX) 2 MG tablet Take 2 mg by mouth every 6 (six) hours as needed for sleep or anxiety.    Marland Kitchen amLODipine (NORVASC) 10 MG tablet Take 10 mg by mouth  daily.   Huey Bienenstock (TECENTRIQ IV) Inject into the vein. Every 21 days   . azithromycin (ZITHROMAX) 250 MG tablet Take 2 tabs by mouth on day 1 then 1 tab daily by mouth until all taken.   Marland Kitchen CALCIUM-MAGNESIUM-ZINC PO Take 1,200 mg by mouth daily.   . COLLAGEN PO Take 7 g by mouth daily.   . cyclobenzaprine (FLEXERIL) 10 MG tablet Take 10 mg by mouth daily as needed for muscle spasms.    . diclofenac  sodium (VOLTAREN) 1 % GEL Apply 2 g topically 4 (four) times daily. Apply to left forearm phlebitic area   . docusate sodium (COLACE) 100 MG capsule Take 1 capsule (100 mg total) by mouth every 12 (twelve) hours.   . Flaxseed, Linseed, (FLAXSEED OIL PO) Take by mouth.   . fluticasone (FLONASE) 50 MCG/ACT nasal spray Place 2 sprays into both nostrils daily as needed for allergies.    . furosemide (LASIX) 20 MG tablet    . Glucosamine-Chondroit-Vit C-Mn (GLUCOSAMINE 1500 COMPLEX) CAPS Take 1 capsule by mouth.   . levETIRAcetam (KEPPRA) 500 MG tablet Take 500 mg by mouth daily.    Marland Kitchen lidocaine (XYLOCAINE) 2 % solution    . lidocaine-prilocaine (EMLA) cream APPLY TO PORTACATH SITE AS DIRECTED   . lisinopril (PRINIVIL,ZESTRIL) 20 MG tablet Take 20 mg by mouth 2 (two) times daily.   . meclizine (ANTIVERT) 12.5 MG tablet Take 1 tablet (12.5 mg total) by mouth 3 (three) times daily as needed for dizziness.   . mirtazapine (REMERON) 7.5 MG tablet Take 1 tablet (7.5 mg total) by mouth at bedtime.   . MULTIPLE VITAMINS PO Take 1 tablet by mouth.   . mupirocin ointment (BACTROBAN) 2 % APPLY TO SKIN TID PRN   . naproxen (NAPROSYN) 500 MG tablet Take 500 mg by mouth 2 (two) times daily with a meal.     . NON FORMULARY SWISH AND SWALLOW 15 MLS PO QID   . NONFORMULARY OR COMPOUNDED ITEM SWISH AND SWALLOW 15 MLS PO QID   . oxyCODONE (ROXICODONE) 15 MG immediate release tablet Take 15 mg by mouth 4 (four) times daily as needed for pain.    . pantoprazole (PROTONIX) 40 MG tablet    . polyethylene glycol (MIRALAX  / GLYCOLAX) packet Take 17 g by mouth daily.   . pravastatin (PRAVACHOL) 20 MG tablet    . predniSONE (DELTASONE) 10 MG tablet Take 1 tablet (10 mg total) by mouth daily with breakfast.   . tamsulosin (FLOMAX) 0.4 MG CAPS capsule Take 0.4 mg by mouth daily.    . temazepam (RESTORIL) 30 MG capsule TAKE 1 CAPSULE AT BEDTIME AS NEEDED FOR SLEEP   . triamcinolone cream (KENALOG) 0.1 %    . TURMERIC PO Take 4 tablets by mouth.   . [DISCONTINUED] predniSONE (DELTASONE) 10 MG tablet Take 10 mg by mouth daily with breakfast.   . [DISCONTINUED] dexamethasone (DECADRON) 2 MG tablet    . [DISCONTINUED] Omega-3 Fatty Acids (FISH OIL) 1000 MG CAPS Take 1,000 mg by mouth 2 (two) times daily.  10/26/2017: Stopped for procedure  . [DISCONTINUED] prochlorperazine (COMPAZINE) 10 MG tablet Take 1 tablet (10 mg total) by mouth every 6 (six) hours as needed (Nausea or vomiting).   . [DISCONTINUED] Turmeric POWD Take by mouth.    No facility-administered encounter medications on file as of 04/27/2018.     ALLERGIES:  Allergies  Allergen Reactions  . Xgeva [Denosumab]     Rash      PHYSICAL EXAM:  ECOG Performance status: 1  Vitals:   04/27/18 0925  BP: (!) 165/76  Pulse: 67  Resp: 18  Temp: 97.7 F (36.5 C)  SpO2: 99%   Filed Weights   04/27/18 0925  Weight: 204 lb 9.6 oz (92.8 kg)    Physical Exam  Constitutional: He is oriented to person, place, and time. He appears well-developed and well-nourished.  Cardiovascular: Normal rate, regular rhythm and normal heart sounds.  Pulmonary/Chest: Effort normal and breath sounds normal.  Neurological: He is alert and oriented to person, place, and time.  Skin: Skin is warm and dry.  No rashes observed on the skin at this time.   LABORATORY DATA:  I have reviewed the labs as listed.  CBC    Component Value Date/Time   WBC 7.0 04/27/2018 0901   RBC 4.46 04/27/2018 0901   HGB 14.0 04/27/2018 0901   HCT 44.0 04/27/2018 0901   PLT 138 (L)  04/27/2018 0901   MCV 98.7 04/27/2018 0901   MCH 31.4 04/27/2018 0901   MCHC 31.8 04/27/2018 0901   RDW 13.4 04/27/2018 0901   LYMPHSABS 1.4 04/27/2018 0901   MONOABS 0.3 04/27/2018 0901   EOSABS 0.1 04/27/2018 0901   BASOSABS 0.0 04/27/2018 0901   CMP Latest Ref Rng & Units 04/27/2018 04/12/2018 03/30/2018  Glucose 70 - 99 mg/dL 110(H) 131(H) 115(H)  BUN 6 - 20 mg/dL 24(H) 28(H) 24(H)  Creatinine 0.61 - 1.24 mg/dL 1.22 1.15 1.14  Sodium 135 - 145 mmol/L 138 138 138  Potassium 3.5 - 5.1 mmol/L 4.6 4.5 3.8  Chloride 98 - 111 mmol/L 101 102 102  CO2 22 - 32 mmol/L 30 29 28   Calcium 8.9 - 10.3 mg/dL 8.8(L) 9.3 9.2  Total Protein 6.5 - 8.1 g/dL 6.4(L) 7.0 6.7  Total Bilirubin 0.3 - 1.2 mg/dL 0.8 0.6 0.5  Alkaline Phos 38 - 126 U/L 44 51 56  AST 15 - 41 U/L 19 17 16   ALT 0 - 44 U/L 40 27 24    I have reviewed images of the CT scan dated 04/06/2018 independently.    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  Extensive stage small cell lung cancer: -4 cycles of carboplatin, etoposide and Atezolizumab from 11/27/2017 through 01/29/2018 -CT scan of the chest, abdomen after 3 cycles of chemo immunotherapy on 01/25/2018 shows near complete resolution of the adenopathy and lung mass.  Sclerosis and bone mets noted.  - Maintenance Atezolizumab started on 02/28/2018, second dose on 03/22/2018, each time resulting in severe urticarial rash on the upper trunk and lower extremities, improved on steroid therapy. -He has gotten severe urticarial rash from Atezolizumab.  I have discussed the results of the CT scan of the chest, abdomen and pelvis dated 04/06/2018 which showed no evidence of disease progression.  Previously measured mediastinal lymph nodes are barely detectable.  Dominant left hilar lymph node measures 2.1 x 0.9 cm.  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  Mild T11 compression fracture with less than 10% height loss was also seen. - I  have sent him for a second opinion with Dr. Elmyra Ricks at [facility]. -He was evaluated by Dr. Berta Minor on 04/19/2018.  He was also referred to [facility] dermatology Dr. Royanne Foots. -It was suggested to give another trial of Atezolizumab.  He discontinued dexamethasone 4 mg daily and started taking prednisone 10 mg daily.  He was also prescribed triamcinolone 0.1% cream to be used twice daily. - We will plan to restart him on Atezolizumab today.  I will see him back in 3 weeks for follow-up. - If he continues to have rash despite all these measures, we will consider changing him to pembrolizumab or nivolumab.  2. Bone metastasis: - He was started on denosumab on 02/02/2018.  Received second dose on 03/02/2018. -He had urticarial rashes which was was thought to be secondary to denosumab.  Hence it was discontinued.  He was started on Zometa on  03/30/2018.  He will continue Zometa every 4 weeks.  3.  Skin rash: - He had severe skin rash (lichenoid dermatitis) on the trunk and upper thighs after each dose of Atezolizumab.  This responded very well to steroids. -She was evaluated by Dr. Koleen Distance [facility].  She prescribed him triamcinolone 0.1% cream to be applied twice daily. -She recommended to give another trial of Atezolizumab.  He will continue prednisone 10 mg daily.  He will continue to use the triamcinolone cream.  She would like to see him 4 to 5 days after Atezolizumab dose.  Hence we have made a follow-up visit with her on 05/03/2018.  He gets diffuse rash typically 7 days after each dose of Atezolizumab. - She would consider starting him on [place].  Cyclosporine at a dose of 1.5 mg to 2.5 mg per kilogram twice a day is also an option.      Orders placed this encounter:  Orders Placed This Encounter  Procedures  . CBC with Differential/Platelet  . Comprehensive metabolic panel  . Lactate dehydrogenase  . TSH      Derek Jack, [place] [phone]

## 2018-04-27 NOTE — Assessment & Plan Note (Signed)
1.  Extensive stage small cell lung cancer: -4 cycles of carboplatin, etoposide and Atezolizumab from 11/27/2017 through 01/29/2018 -CT scan of the chest, abdomen after 3 cycles of chemo immunotherapy on 01/25/2018 shows near complete resolution of the adenopathy and lung mass.  Sclerosis and bone mets noted.  - Maintenance Atezolizumab started on 02/28/2018, second dose on 03/22/2018, each time resulting in severe urticarial rash on the upper trunk and lower extremities, improved on steroid therapy. -He has gotten severe urticarial rash from Atezolizumab.  I have discussed the results of the CT scan of the chest, abdomen and pelvis dated 04/06/2018 which showed no evidence of disease progression.  Previously measured mediastinal lymph nodes are barely detectable.  Dominant left hilar lymph node measures 2.1 x 0.9 cm.  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  Mild T11 compression fracture with less than 10% height loss was also seen. - I have sent him for a second opinion with Dr. Elmyra Ricks at [facility]. -He was evaluated by Dr. Berta Minor on 04/19/2018.  He was also referred to [facility] dermatology Dr. Royanne Foots. -It was suggested to give another trial of Atezolizumab.  He discontinued dexamethasone 4 mg daily and started taking prednisone 10 mg daily.  He was also prescribed triamcinolone 0.1% cream to be used twice daily. - We will plan to restart him on Atezolizumab today.  I will see him back in 3 weeks for follow-up. - If he continues to have rash despite all these measures, we will consider changing him to pembrolizumab or nivolumab.  2. Bone metastasis: - He was started on denosumab on 02/02/2018.  Received second dose on 03/02/2018. -He had urticarial rashes which was was thought to be secondary to denosumab.  Hence it was discontinued.  He was started on Zometa on 03/30/2018.  He will continue Zometa every 4 weeks.  3.  Skin rash: - He had severe skin rash  (lichenoid dermatitis) on the trunk and upper thighs after each dose of Atezolizumab.  This responded very well to steroids. -She was evaluated by Dr. Koleen Distance [facility].  She prescribed him triamcinolone 0.1% cream to be applied twice daily. -She recommended to give another trial of Atezolizumab.  He will continue prednisone 10 mg daily.  He will continue to use the triamcinolone cream.  She would like to see him 4 to 5 days after Atezolizumab dose.  Hence we have made a follow-up visit with her on 05/03/2018.  He gets diffuse rash typically 7 days after each dose of Atezolizumab. - She would consider starting him on [place].  Cyclosporine at a dose of 1.5 mg to 2.5 mg per kilogram twice a day is also an option.

## 2018-04-27 NOTE — Patient Instructions (Signed)
[facility] Discharge Instructions for Patients Receiving Chemotherapy   Beginning January 23rd 2017 lab work for the [facility] will be done in the  Main lab at [facility] on 1st floor. If you have a lab appointment with the [facility] please come in thru the  Main Entrance and check in at the main information desk   Today you received the following chemotherapy agents Tecentriq as well as Zometa infusion. Follow-up as scheduled. Call clinic for any questions or concerns  To help prevent nausea and vomiting after your treatment, we encourage you to take your nausea medication   If you develop nausea and vomiting, or diarrhea that is not controlled by your medication, call the clinic.  The clinic phone number is [phone]. Office hours are Monday-Friday 8:30am-5:00pm.  BELOW ARE SYMPTOMS THAT SHOULD BE REPORTED IMMEDIATELY:  *FEVER GREATER THAN 101.0 F  *CHILLS WITH OR WITHOUT FEVER  NAUSEA AND VOMITING THAT IS NOT CONTROLLED WITH YOUR NAUSEA MEDICATION  *UNUSUAL SHORTNESS OF BREATH  *UNUSUAL BRUISING OR BLEEDING  TENDERNESS IN MOUTH AND THROAT WITH OR WITHOUT PRESENCE OF ULCERS  *URINARY PROBLEMS  *BOWEL PROBLEMS  UNUSUAL RASH Items with * indicate a potential emergency and should be followed up as soon as possible. If you have an emergency after office hours please contact your primary care physician or go to the nearest emergency department.  Please call the clinic during office hours if you have any questions or concerns.   You may also contact the Patient Navigator at [phone] should you have any questions or need assistance in obtaining follow up care.      Resources For Cancer Patients and their Caregivers ? American Cancer Society: Can assist with transportation, wigs, general needs, runs Look Good Feel Better.        (512)482-9479 ? Cancer Care: Provides financial assistance, online support groups, medication/co-pay  assistance.  1-800-813-HOPE 2143668325) ? [facility] Assists [place] cancer patients and their families through emotional , educational and financial support.  (219)005-6816 ? [place] DSS Where to apply for food stamps, Medicaid and utility assistance. 570 694 7246 ? [facility]: Transportation to medical appointments. (931)412-8396 ? Social Security Administration: May apply for disability if have a Stage IV cancer. 5087864061 (479)265-7403 ? [employer], Disability and Transit Services: Assists with nutrition, care and transit needs. 3851397742

## 2018-04-27 NOTE — Patient Instructions (Signed)
[place] at [facility] Discharge Instructions  Follow up in 3 weeks with labs.    Thank you for choosing [place] at [facility] to provide your oncology and hematology care.  To afford each patient quality time with our provider, please arrive at least 15 minutes before your scheduled appointment time.   If you have a lab appointment with the [facility] please come in thru the  Main Entrance and check in at the main information desk  You need to re-schedule your appointment should you arrive 10 or more minutes late.  We strive to give you quality time with our providers, and arriving late affects you and other patients whose appointments are after yours.  Also, if you no show three or more times for appointments you may be dismissed from the clinic at the providers discretion.     Again, thank you for choosing [facility].  Our hope is that these requests will decrease the amount of time that you wait before being seen by our physicians.       _____________________________________________________________  Should you have questions after your visit to [facility], please contact our office at [phone] between the hours of 8:00 a.m. and 4:30 p.m.  Voicemails left after 4:00 p.m. will not be returned until the following business day.  For prescription refill requests, have your pharmacy contact our office and allow 72 hours.    Cancer Center Support Programs:   > Cancer Support Group  2nd Tuesday of the month 1pm-2pm, Journey Room

## 2018-04-27 NOTE — Progress Notes (Signed)
1020 Labs reviewed with and pt seen by Dr. Delton Coombes today and pt approved for Tecentriq and Zometa infusions                                                            [name] [name] tolerated Tecentriq and Zometa infusions well without complaints or incident. Calcium 8.8 today and pt denied any tooth or jaw pain and no recent or future dental visits prior to administering Zometa infusion. VSS upon discharge. Pt discharged self ambulatory in satisfactory condition accompanied by his wife

## 2018-04-28 ENCOUNTER — Encounter (HOSPITAL_COMMUNITY): Payer: Self-pay | Admitting: Emergency Medicine

## 2018-04-28 ENCOUNTER — Emergency Department (HOSPITAL_COMMUNITY): Payer: Medicare HMO

## 2018-04-28 ENCOUNTER — Emergency Department (HOSPITAL_COMMUNITY)
Admission: EM | Admit: 2018-04-28 | Discharge: 2018-04-28 | Disposition: A | Discharge status: Home / Self Care | Payer: Medicare HMO | Attending: Emergency Medicine | Admitting: Emergency Medicine

## 2018-04-28 ENCOUNTER — Other Ambulatory Visit: Payer: Self-pay

## 2018-04-28 DIAGNOSIS — Z8673 Personal history of transient ischemic attack (TIA), and cerebral infarction without residual deficits: Secondary | ICD-10-CM | POA: Diagnosis not present

## 2018-04-28 DIAGNOSIS — Z87891 Personal history of nicotine dependence: Secondary | ICD-10-CM | POA: Insufficient documentation

## 2018-04-28 DIAGNOSIS — I10 Essential (primary) hypertension: Secondary | ICD-10-CM | POA: Diagnosis not present

## 2018-04-28 DIAGNOSIS — J449 Chronic obstructive pulmonary disease, unspecified: Secondary | ICD-10-CM | POA: Diagnosis not present

## 2018-04-28 DIAGNOSIS — R600 Localized edema: Secondary | ICD-10-CM | POA: Diagnosis not present

## 2018-04-28 DIAGNOSIS — R509 Fever, unspecified: Secondary | ICD-10-CM | POA: Diagnosis not present

## 2018-04-28 DIAGNOSIS — E119 Type 2 diabetes mellitus without complications: Secondary | ICD-10-CM | POA: Insufficient documentation

## 2018-04-28 DIAGNOSIS — R112 Nausea with vomiting, unspecified: Secondary | ICD-10-CM | POA: Diagnosis not present

## 2018-04-28 DIAGNOSIS — Z85118 Personal history of other malignant neoplasm of bronchus and lung: Secondary | ICD-10-CM | POA: Diagnosis not present

## 2018-04-28 DIAGNOSIS — R609 Edema, unspecified: Secondary | ICD-10-CM

## 2018-04-28 DIAGNOSIS — Z79899 Other long term (current) drug therapy: Secondary | ICD-10-CM | POA: Diagnosis not present

## 2018-04-28 LAB — COMPREHENSIVE METABOLIC PANEL
ALT: 48 U/L — ABNORMAL HIGH (ref 0–44)
ANION GAP: 6 (ref 5–15)
AST: 23 U/L (ref 15–41)
Albumin: 3.4 g/dL — ABNORMAL LOW (ref 3.5–5.0)
Alkaline Phosphatase: 45 U/L (ref 38–126)
BILIRUBIN TOTAL: 0.8 mg/dL (ref 0.3–1.2)
BUN: 17 mg/dL (ref 6–20)
CO2: 23 mmol/L (ref 22–32)
Calcium: 8.6 mg/dL — ABNORMAL LOW (ref 8.9–10.3)
Chloride: 106 mmol/L (ref 98–111)
Creatinine, Ser: 1.04 mg/dL (ref 0.61–1.24)
GFR calc Af Amer: >60 mL/min (ref >60)
Glucose, Bld: 123 mg/dL — ABNORMAL HIGH (ref 70–99)
POTASSIUM: 4.8 mmol/L (ref 3.5–5.1)
Sodium: 135 mmol/L (ref 135–145)
TOTAL PROTEIN: 6.4 g/dL — AB (ref 6.5–8.1)

## 2018-04-28 LAB — CBC
HEMATOCRIT: 43.7 % (ref 39.0–52.0)
HEMOGLOBIN: 14 g/dL (ref 13.0–17.0)
MCH: 31.4 pg (ref 26.0–34.0)
MCHC: 32 g/dL (ref 30.0–36.0)
MCV: 98 fL (ref 78.0–100.0)
Platelets: 127 10*3/uL — ABNORMAL LOW (ref 150–400)
RBC: 4.46 MIL/uL (ref 4.22–5.81)
RDW: 13.6 % (ref 11.5–15.5)
WBC: 6.9 10*3/uL (ref 4.0–10.5)

## 2018-04-28 LAB — URINALYSIS, ROUTINE W REFLEX MICROSCOPIC
Bilirubin Urine: NEGATIVE
Glucose, UA: NEGATIVE mg/dL
Hgb urine dipstick: NEGATIVE
KETONES UR: NEGATIVE mg/dL
LEUKOCYTES UA: NEGATIVE
NITRITE: NEGATIVE
PH: 7 (ref 5.0–8.0)
Protein, ur: NEGATIVE mg/dL
SPECIFIC GRAVITY, URINE: 1.016 (ref 1.005–1.030)

## 2018-04-28 LAB — LIPASE, BLOOD: Lipase: 30 U/L (ref 11–51)

## 2018-04-28 LAB — TROPONIN I: Troponin I: <0.03 ng/mL (ref <0.03)

## 2018-04-28 LAB — LACTIC ACID, PLASMA
LACTIC ACID, VENOUS: 0.8 mmol/L (ref 0.5–1.9)
Lactic Acid, Venous: 0.8 mmol/L (ref 0.5–1.9)

## 2018-04-28 MED ORDER — AMOXICILLIN-POT CLAVULANATE 875-125 MG PO TABS: 1.0000 | ORAL_TABLET | Freq: Two times a day (BID) | ORAL | 0 refills | Status: DC

## 2018-04-28 MED ORDER — HEPARIN SOD (PORK) LOCK FLUSH 100 UNIT/ML IV SOLN
INTRAVENOUS | Status: AC
  Filled 2018-04-28: qty 5

## 2018-04-28 MED ORDER — SODIUM CHLORIDE 0.9 % IV SOLN: 2.0000 g | Freq: Once | INTRAVENOUS | Status: DC

## 2018-04-28 NOTE — ED Provider Notes (Signed)
[facility] EMERGENCY DEPARTMENT Provider Note   CSN: [id number] Arrival date & time: 04/28/18  1418     History   Chief Complaint Chief Complaint  Patient presents with  . Emesis    HPI [name] [name] is a 57 y.o. male.  Pt presents to the ED today with n/v/d and fever.  Pt has a hx of extensive stage small cell lung cancer.  The pt has been getting immunotherapy, but developed a rash after Atezolizumab.  The pt had been on steroids which resolved the rash.  The pt had a ct of chest, abd, pelvis on 7/26 which showed no evidence of disease progression.  The pt did have a dose of Atezolizumab and Zometa yesterday.  No rash today, it usually takes a few days. Last night, the pt developed fever (tmax 102.7) and chills.  He continued to have a fever today, and took tylenol.       Past Medical History:  Diagnosis Date  . Anxiety   . Asthma   . COPD (chronic obstructive pulmonary disease) ([place])   . DDD (degenerative disc disease)   . Diverticulitis   . GERD (gastroesophageal reflux disease)   . Headache   . Hepatitis    in the late 70's   . HTN (hypertension)   . Mediastinal adenopathy   . Seizures ([place])    as of 2019 none for 20 years  . Sleep apnea    does not  use cpap  . Stroke [facility])    TIA - Feb. 2018    Patient Active Problem List   Diagnosis Date Noted  . Bone metastasis ([place]) 01/29/2018  . Malignant neoplasm of bronchus and lung ([place])   . Extensive stage primary small cell carcinoma of lung ([place]) 11/23/2017  . Goals of care, counseling/discussion 11/23/2017  . Hyperlipidemia 01/03/2017  . Family history of heart disease 01/03/2017  . Blurry vision 10/26/2016  . Slurred speech 10/26/2016  . Cerebrovascular disease 10/26/2016  . Chest pain 05/10/2012  . HTN (hypertension) 05/10/2012  . Chronic pain 05/10/2012  . Diabetes mellitus ([place]) 05/10/2012  . Screening for colon cancer 07/25/2011    Past Surgical History:  Procedure Laterality Date  .  CERVICAL DISC SURGERY     X4  . CHOLECYSTECTOMY    . COLONOSCOPY  08/24/2011   Procedure: COLONOSCOPY;  Surgeon: Daneil Dolin, MD;  Location: [facility] ENDO SUITE;  Service: Endoscopy;  Laterality: N/A;  7:30AM  . ELBOW SURGERY     right  . PORTACATH PLACEMENT Right 12/01/2017   Procedure: INSERTION PORT-A-CATH;  Surgeon: Aviva Signs, MD;  Location: [facility] ORS;  Service: General;  Laterality: Right;  Marland Kitchen VIDEO BRONCHOSCOPY WITH ENDOBRONCHIAL ULTRASOUND N/A 10/31/2017   Procedure: VIDEO BRONCHOSCOPY WITH ENDOBRONCHIAL ULTRASOUND;  Surgeon: Ivin Poot, MD;  Location: [place];  Service: Thoracic;  Laterality: N/A;  . VIDEO BRONCHOSCOPY WITH ENDOBRONCHIAL ULTRASOUND N/A 11/22/2017   Procedure: VIDEO BRONCHOSCOPY WITH ENDOBRONCHIAL ULTRASOUND;  Surgeon: Melrose Nakayama, MD;  Location: [place];  Service: Thoracic;  Laterality: N/A;        Home Medications    Prior to Admission medications   Medication Sig Start Date End Date Taking? Authorizing Provider  albuterol (PROVENTIL HFA;VENTOLIN HFA) 108 (90 BASE) MCG/ACT inhaler Inhale 2 puffs into the lungs every 6 (six) hours as needed for wheezing or shortness of breath.     [provider]  albuterol (PROVENTIL) (2.5 MG/3ML) 0.083% nebulizer solution Take 2.5 mg by nebulization every 6 (six)  hours as needed for wheezing or shortness of breath.     [provider]  alprazolam Duanne Moron) 2 MG tablet Take 2 mg by mouth every 6 (six) hours as needed for sleep or anxiety.     [provider]  amLODipine (NORVASC) 10 MG tablet Take 10 mg by mouth daily.    [provider]  amoxicillin-clavulanate (AUGMENTIN) 875-125 MG tablet Take 1 tablet by mouth every 12 (twelve) hours. 04/28/18   Isla Pence, MD  Atezolizumab (TECENTRIQ IV) Inject into the vein. Every 21 days    [provider]  azithromycin (ZITHROMAX) 250 MG tablet Take 2 tabs by mouth on day 1 then 1 tab daily by mouth until all taken. 02/14/18   Derek Jack, MD  CALCIUM-MAGNESIUM-ZINC PO Take 1,200 mg by mouth daily.    [provider]  COLLAGEN PO Take 7 g by mouth daily.    [provider]  cyclobenzaprine (FLEXERIL) 10 MG tablet Take 10 mg by mouth daily as needed for muscle spasms.  07/11/16   [provider]  diclofenac sodium (VOLTAREN) 1 % GEL Apply 2 g topically 4 (four) times daily. Apply to left forearm phlebitic area 01/08/18   Derek Jack, MD  docusate sodium (COLACE) 100 MG capsule Take 1 capsule (100 mg total) by mouth every 12 (twelve) hours. 11/27/17   Mesner, Corene Cornea, MD  Flaxseed, Linseed, (FLAXSEED OIL PO) Take by mouth.    [provider]  fluticasone (FLONASE) 50 MCG/ACT nasal spray Place 2 sprays into both nostrils daily as needed for allergies.  07/11/16   [provider]  furosemide (LASIX) 20 MG tablet  04/10/18   [provider]  Glucosamine-Chondroit-Vit C-Mn (GLUCOSAMINE 1500 COMPLEX) CAPS Take 1 capsule by mouth.    [provider]  levETIRAcetam (KEPPRA) 500 MG tablet Take 500 mg by mouth daily.  11/29/16   [provider]  lidocaine (XYLOCAINE) 2 % solution  01/24/18   [provider]  lidocaine-prilocaine (EMLA) cream APPLY TO PORTACATH SITE AS DIRECTED 01/22/18   Derek Jack, MD  lisinopril (PRINIVIL,ZESTRIL) 20 MG tablet Take 20 mg by mouth 2 (two) times daily.    [provider]  meclizine (ANTIVERT) 12.5 MG tablet Take 1 tablet (12.5 mg total) by mouth 3 (three) times daily as needed for dizziness. 10/28/16   Florencia Reasons, MD  mirtazapine (REMERON) 7.5 MG tablet Take 1 tablet (7.5 mg total) by mouth at bedtime. 03/22/18   Derek Jack, MD  MULTIPLE VITAMINS PO Take 1 tablet by mouth.    [provider]  mupirocin ointment (BACTROBAN) 2 % APPLY TO SKIN TID PRN 02/15/18   [provider]  naproxen (NAPROSYN) 500 MG tablet Take 500 mg by mouth 2 (two) times daily with a meal.      [provider]  NON FORMULARY SWISH AND SWALLOW 15 MLS PO QID 01/24/18   [provider]  NONFORMULARY OR COMPOUNDED ITEM SWISH AND SWALLOW 15 MLS PO QID 01/24/18   [provider]  oxyCODONE (ROXICODONE) 15 MG immediate release tablet Take 15 mg by mouth 4 (four) times daily as needed for pain.     [provider]  pantoprazole (PROTONIX) 40 MG tablet  02/09/18   [provider]  polyethylene glycol (MIRALAX / GLYCOLAX) packet Take 17 g by mouth daily. 11/27/17   Mesner, Corene Cornea, MD  pravastatin (PRAVACHOL) 20 MG tablet  02/09/18   [provider]  predniSONE (DELTASONE) 10 MG tablet Take 1  tablet (10 mg total) by mouth daily with breakfast. 04/27/18   Lockamy, Randi L, NP-C  tamsulosin (FLOMAX) 0.4 MG CAPS capsule Take 0.4 mg by mouth daily.  07/11/16   [provider]  temazepam (RESTORIL) 30 MG capsule TAKE 1 CAPSULE AT BEDTIME AS NEEDED FOR SLEEP 03/28/18   Lockamy, Randi L, NP-C  triamcinolone cream (KENALOG) 0.1 %  04/23/18   [provider]  TURMERIC PO Take 4 tablets by mouth.    [provider]  prochlorperazine (COMPAZINE) 10 MG tablet Take 1 tablet (10 mg total) by mouth every 6 (six) hours as needed (Nausea or vomiting). 11/23/17 02/22/18  Derek Jack, MD    Family History Family History  Problem Relation Age of Onset  . Hypertension Father   . Heart attack Father   . Kidney failure Mother   . Diabetes Mother   . Hypertension Mother   . Colon cancer Neg Hx   . Anesthesia problems Neg Hx   . Hypotension Neg Hx   . Malignant hyperthermia Neg Hx   . Pseudochol deficiency Neg Hx     Social History Social History   Tobacco Use  . Smoking status: Former Smoker    Packs/day: 1.50    Years: 30.00    Pack years: 45.00    Types: Cigarettes    Last attempt to quit: 10/13/2017    Years since quitting: 0.5  . Smokeless tobacco: Never Used  Substance Use Topics  . Alcohol use: No  . Drug use: No      Allergies   Xgeva [denosumab] and Tecentriq [atezolizumab]   Review of Systems Review of Systems  Constitutional: Positive for fever.  Gastrointestinal: Positive for nausea and vomiting.     Physical Exam Updated Vital Signs BP (!) 141/90   Pulse (!) 59   Temp 97.9 F (36.6 C) (Oral)   Resp 16   Ht 5\' 7"  (1.702 m)   Wt 92.8 kg   SpO2 97%   BMI 32.04 kg/m   Physical Exam  Constitutional: He is oriented to person, place, and time. He appears well-developed and well-nourished.  HENT:  Head: Normocephalic and atraumatic.  Right Ear: External ear normal.  Left Ear: External ear normal.  Nose: Nose normal.  Mouth/Throat: Mucous membranes are dry.  Eyes: Pupils are equal, round, and reactive to light. Conjunctivae and EOM are normal.  Neck: Normal range of motion. Neck supple.  Cardiovascular: Normal rate, regular rhythm, normal heart sounds and intact distal pulses.  Pulmonary/Chest: Effort normal and breath sounds normal.  Abdominal: Soft. Bowel sounds are normal.  Musculoskeletal: Normal range of motion. He exhibits edema.  Neurological: He is alert and oriented to person, place, and time.  Skin: Skin is warm and dry. Capillary refill takes less than 2 seconds.  Psychiatric: He has a normal mood and affect. His behavior is normal. Judgment and thought content normal.  Nursing note and vitals reviewed.    ED Treatments / Results  Labs (all labs ordered are listed, but only abnormal results are displayed) Labs Reviewed  COMPREHENSIVE METABOLIC PANEL - Abnormal; Notable for the following components:      Result Value   Glucose, Bld 123 (*)    Calcium 8.6 (*)    Total Protein 6.4 (*)    Albumin 3.4 (*)    ALT 48 (*)    All other components within normal limits  CBC - Abnormal; Notable for the following components:   Platelets 127 (*)    All  other components within normal limits  CULTURE, BLOOD (ROUTINE X 2)  CULTURE, BLOOD (ROUTINE X 2)  URINE CULTURE   LIPASE, BLOOD  URINALYSIS, ROUTINE W REFLEX MICROSCOPIC  TROPONIN I  LACTIC ACID, PLASMA  LACTIC ACID, PLASMA    EKG EKG Interpretation  Date/Time:  Saturday April 28 2018 16:10:20 EDT Ventricular Rate:  63 PR Interval:    QRS Duration: 98 QT Interval:  402 QTC Calculation: 412 R Axis:   55 Text Interpretation:  Sinus rhythm No significant change since last tracing Confirmed by Isla Pence [phone]) on 04/28/2018 4:20:41 PM   Radiology Dg Chest 2 View  Result Date: 04/28/2018 CLINICAL DATA:  Fever, nausea, vomiting and diarrhea. History of lung cancer. EXAM: CHEST - 2 VIEW COMPARISON:  04/06/2018 chest CT, 03/22/2018 chest radiograph prior studies FINDINGS: The cardiomediastinal silhouette is unremarkable. A RIGHT Port-A-Cath is again identified with tip overlying the mid SVC. There is no evidence of focal airspace disease, pulmonary edema, pleural effusion, or pneumothorax. The LEFT lung nodular opacities on the recent CT are difficult to visualize. No acute bony abnormalities are identified. Cervical fusion hardware again noted. IMPRESSION: No acute abnormality. Electronically Signed   By: Margarette Canada M.D.   On: 04/28/2018 16:55    Procedures Procedures (including critical care time)  Medications Ordered in ED Medications - No data to display   Initial Impression / Assessment and Plan / ED Course  I have reviewed the triage vital signs and the nursing notes.  Pertinent labs & imaging results that were available during my care of the patient were reviewed by me and considered in my medical decision making (see chart for details).  Pt does have peripheral edema.  Pt was on lasix, but that made him too thirsty.  Pt encouraged to wear compression socks/hose.   Pt feels good.  He is not neutropenic.  Pt d/w Dr. Delton Coombes (oncology) who does not feel pt needs to stay as he is not neutropenic.  He recommends augmentin to take home to take only if fever returns.  Otherwise,  don't take it.  Pt knows to let oncology know if his fever returns.  Blood cultures have been drawn.  Oncology will follow that.  Pt is stable for d/c.   Final Clinical Impressions(s) / ED Diagnoses   Final diagnoses:  Fever, unspecified fever cause  Peripheral edema    ED Discharge Orders         Ordered    amoxicillin-clavulanate (AUGMENTIN) 875-125 MG tablet  Every 12 hours,   Status:  Discontinued     04/28/18 1724    amoxicillin-clavulanate (AUGMENTIN) 875-125 MG tablet  Every 12 hours     04/28/18 1724           Isla Pence, MD 04/28/18 1728

## 2018-04-28 NOTE — Discharge Instructions (Signed)
Do not take antibiotics unless your fever return.  If it does return, call your oncologist.

## 2018-04-28 NOTE — ED Triage Notes (Signed)
Patient c/o nausea, vomiting, diarrhea, fevers, cramping, increased urination, and fatigue that started yesterday after receiving Trecentriq and Zometa at cancer center. Patient being treated for lung cancer. Per patient has allergy to Tecentriq in which he gets a rash but is given prednisone to counteract reaction. Per wife patient's highest fever was 102.7 at home. Patient taking tylenol with last dose at 10:30am

## 2018-04-29 LAB — URINE CULTURE: CULTURE: NO GROWTH

## 2018-05-02 ENCOUNTER — Telehealth (HOSPITAL_COMMUNITY): Payer: Self-pay | Admitting: *Deleted

## 2018-05-02 NOTE — Telephone Encounter (Signed)
Pt states that he has not had anymore fever since Sunday. No rash as of yet! Pt states that the only thing that he had this time is a fever that started Friday afternoon and he felt like he was dying. Pt advised to keep his follow up appointment that he has with [facility] tomorrow. Pt also advised to call us if anything changes. Pt verbalized understanding.

## 2018-05-03 DIAGNOSIS — L270 Generalized skin eruption due to drugs and medicaments taken internally: Secondary | ICD-10-CM | POA: Diagnosis not present

## 2018-05-03 LAB — CULTURE, BLOOD (ROUTINE X 2)
CULTURE: NO GROWTH
Culture: NO GROWTH
SPECIAL REQUESTS: ADEQUATE
SPECIAL REQUESTS: ADEQUATE

## 2018-05-08 ENCOUNTER — Ambulatory Visit (HOSPITAL_COMMUNITY): Payer: Medicare HMO | Admitting: Hematology

## 2018-05-08 ENCOUNTER — Other Ambulatory Visit (HOSPITAL_COMMUNITY): Payer: Medicare HMO

## 2018-05-18 ENCOUNTER — Ambulatory Visit (HOSPITAL_COMMUNITY): Payer: Medicare HMO

## 2018-05-18 ENCOUNTER — Other Ambulatory Visit: Payer: Self-pay

## 2018-05-18 ENCOUNTER — Inpatient Hospital Stay (HOSPITAL_COMMUNITY): Payer: Medicare HMO | Attending: Hematology

## 2018-05-18 ENCOUNTER — Inpatient Hospital Stay (HOSPITAL_BASED_OUTPATIENT_CLINIC_OR_DEPARTMENT_OTHER): Payer: Medicare HMO | Admitting: Hematology

## 2018-05-18 ENCOUNTER — Encounter (HOSPITAL_COMMUNITY): Payer: Self-pay | Admitting: Hematology

## 2018-05-18 ENCOUNTER — Inpatient Hospital Stay (HOSPITAL_COMMUNITY): Payer: Medicare HMO

## 2018-05-18 VITALS — BP 141/71 | HR 65 | Temp 98.2°F | Resp 18 | Wt 192.0 lb

## 2018-05-18 VITALS — BP 147/77 | HR 66 | Temp 97.8°F | Resp 18

## 2018-05-18 DIAGNOSIS — C3492 Malignant neoplasm of unspecified part of left bronchus or lung: Secondary | ICD-10-CM | POA: Insufficient documentation

## 2018-05-18 DIAGNOSIS — K219 Gastro-esophageal reflux disease without esophagitis: Secondary | ICD-10-CM | POA: Diagnosis not present

## 2018-05-18 DIAGNOSIS — C7951 Secondary malignant neoplasm of bone: Secondary | ICD-10-CM

## 2018-05-18 DIAGNOSIS — I10 Essential (primary) hypertension: Secondary | ICD-10-CM | POA: Diagnosis not present

## 2018-05-18 DIAGNOSIS — C3490 Malignant neoplasm of unspecified part of unspecified bronchus or lung: Secondary | ICD-10-CM

## 2018-05-18 DIAGNOSIS — Z8673 Personal history of transient ischemic attack (TIA), and cerebral infarction without residual deficits: Secondary | ICD-10-CM | POA: Insufficient documentation

## 2018-05-18 DIAGNOSIS — L309 Dermatitis, unspecified: Secondary | ICD-10-CM | POA: Insufficient documentation

## 2018-05-18 DIAGNOSIS — Z79899 Other long term (current) drug therapy: Secondary | ICD-10-CM | POA: Diagnosis not present

## 2018-05-18 DIAGNOSIS — J449 Chronic obstructive pulmonary disease, unspecified: Secondary | ICD-10-CM | POA: Diagnosis not present

## 2018-05-18 DIAGNOSIS — Z7189 Other specified counseling: Secondary | ICD-10-CM

## 2018-05-18 DIAGNOSIS — Z7952 Long term (current) use of systemic steroids: Secondary | ICD-10-CM | POA: Insufficient documentation

## 2018-05-18 DIAGNOSIS — F419 Anxiety disorder, unspecified: Secondary | ICD-10-CM | POA: Insufficient documentation

## 2018-05-18 DIAGNOSIS — G4733 Obstructive sleep apnea (adult) (pediatric): Secondary | ICD-10-CM | POA: Diagnosis not present

## 2018-05-18 DIAGNOSIS — Z87891 Personal history of nicotine dependence: Secondary | ICD-10-CM

## 2018-05-18 DIAGNOSIS — Z9221 Personal history of antineoplastic chemotherapy: Secondary | ICD-10-CM | POA: Diagnosis not present

## 2018-05-18 DIAGNOSIS — Z5112 Encounter for antineoplastic immunotherapy: Secondary | ICD-10-CM | POA: Insufficient documentation

## 2018-05-18 DIAGNOSIS — G4730 Sleep apnea, unspecified: Secondary | ICD-10-CM | POA: Insufficient documentation

## 2018-05-18 LAB — COMPREHENSIVE METABOLIC PANEL
ALK PHOS: 55 U/L (ref 38–126)
ALT: 22 U/L (ref 0–44)
AST: 19 U/L (ref 15–41)
Albumin: 4.1 g/dL (ref 3.5–5.0)
Anion gap: 10 (ref 5–15)
BILIRUBIN TOTAL: 0.7 mg/dL (ref 0.3–1.2)
BUN: 23 mg/dL — AB (ref 6–20)
CALCIUM: 9.4 mg/dL (ref 8.9–10.3)
CO2: 26 mmol/L (ref 22–32)
Chloride: 104 mmol/L (ref 98–111)
Creatinine, Ser: 1.13 mg/dL (ref 0.61–1.24)
GFR calc Af Amer: >60 mL/min (ref >60)
GLUCOSE: 112 mg/dL — AB (ref 70–99)
Potassium: 3.6 mmol/L (ref 3.5–5.1)
Sodium: 140 mmol/L (ref 135–145)
TOTAL PROTEIN: 7 g/dL (ref 6.5–8.1)

## 2018-05-18 LAB — CBC WITH DIFFERENTIAL/PLATELET
BASOS ABS: 0.1 10*3/uL (ref 0.0–0.1)
BASOS PCT: 1 %
Eosinophils Absolute: 0.1 10*3/uL (ref 0.0–0.7)
Eosinophils Relative: 1 %
HEMATOCRIT: 43.1 % (ref 39.0–52.0)
HEMOGLOBIN: 14.4 g/dL (ref 13.0–17.0)
LYMPHS PCT: 25 %
Lymphs Abs: 1.7 10*3/uL (ref 0.7–4.0)
MCH: 31.4 pg (ref 26.0–34.0)
MCHC: 33.4 g/dL (ref 30.0–36.0)
MCV: 94.1 fL (ref 78.0–100.0)
MONOS PCT: 10 %
Monocytes Absolute: 0.7 10*3/uL (ref 0.1–1.0)
NEUTROS ABS: 4.3 10*3/uL (ref 1.7–7.7)
NEUTROS PCT: 63 %
Platelets: 180 10*3/uL (ref 150–400)
RBC: 4.58 MIL/uL (ref 4.22–5.81)
RDW: 13.1 % (ref 11.5–15.5)
WBC: 6.8 10*3/uL (ref 4.0–10.5)

## 2018-05-18 LAB — LACTATE DEHYDROGENASE: LDH: 219 U/L — ABNORMAL HIGH (ref 98–192)

## 2018-05-18 LAB — TSH: TSH: 1.075 u[IU]/mL (ref 0.350–4.500)

## 2018-05-18 MED ORDER — SODIUM CHLORIDE 0.9 % IV SOLN
1200.0000 mg | Freq: Once | INTRAVENOUS | Status: AC
  Administered 2018-05-18: 1200 mg via INTRAVENOUS
  Filled 2018-05-18: qty 20

## 2018-05-18 MED ORDER — SODIUM CHLORIDE 0.9 % IV SOLN
Freq: Once | INTRAVENOUS | Status: AC
  Administered 2018-05-18: 10:00:00 via INTRAVENOUS

## 2018-05-18 MED ORDER — HEPARIN SOD (PORK) LOCK FLUSH 100 UNIT/ML IV SOLN
500.0000 [IU] | Freq: Once | INTRAVENOUS | Status: AC | PRN
  Administered 2018-05-18: 500 [IU]

## 2018-05-18 NOTE — Assessment & Plan Note (Signed)
1.  Extensive stage small cell lung cancer: -4 cycles of carboplatin, etoposide and Atezolizumab from 11/27/2017 through 01/29/2018 -CT scan of the chest, abdomen after 3 cycles of chemo immunotherapy on 01/25/2018 shows near complete resolution of the adenopathy and lung mass.  Sclerosis and bone mets noted.  - Maintenance Atezolizumab started on 02/28/2018, second dose on 03/22/2018, each time resulting in severe urticarial rash on the upper trunk and lower extremities, improved on steroid therapy. -He has gotten severe urticarial rash from Atezolizumab.  I have discussed the results of the CT scan of the chest, abdomen and pelvis dated 04/06/2018 which showed no evidence of disease progression.  Previously measured mediastinal lymph nodes are barely detectable.  Dominant left hilar lymph node measures 2.1 x 0.9 cm.  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  Mild T11 compression fracture with less than 10% height loss was also seen. - I have sent him for a second opinion with Dr. Elmyra Ricks at [facility]. -He was evaluated by Dr. Berta Minor on 04/19/2018.  He was also referred to [facility] dermatology Dr. Royanne Foots. -It was suggested to give another trial of Atezolizumab.  He discontinued dexamethasone 4 mg daily and started taking prednisone 10 mg daily.  He was also prescribed triamcinolone 0.1% cream to be used twice daily. - He received last Atezolizumab on 04/27/2018.  He developed a fever of 102 degrees on the same day night.  He presented to the ER next day.  No source of infection was found.  CBC with differential was normal.  The fever subsided by itself. -he did not experience any rash after last immunotherapy.  He was also evaluated by dermatology at [facility]. - He may proceed with Atezolizumab today.  He was asked to call his, should he develop any fever or rash.  We will see him back in 3 weeks and repeat CT scan of the chest, abdomen and pelvis.  We will plan to repeat  MRI of the brain every 4 months.  2. Bone metastasis: - He was started on denosumab on 02/02/2018.  Received second dose on 03/02/2018. -He had urticarial rashes which was was thought to be secondary to denosumab.  Hence it was discontinued.  He was started on Zometa on 03/30/2018.  He will come back for Zometa next week.  His labs are adequate to proceed with it.  3.  Skin rash: - He had severe skin rash (lichenoid dermatitis) on the trunk and upper thighs after each dose of Atezolizumab.  This responded very well to steroids. -She was evaluated by Dr. Koleen Distance [facility].  She prescribed him triamcinolone 0.1% cream to be applied twice daily. -She recommended to give another trial of Atezolizumab.  He will continue prednisone 10 mg daily.  He will continue to use the triamcinolone cream.  She would like to see him 4 to 5 days after Atezolizumab dose.  Hence we have made a follow-up visit with her on 05/03/2018.  He gets diffuse rash typically 7 days after each dose of Atezolizumab. - She would consider starting him on [place].  Cyclosporine at a dose of 1.5 mg to 2.5 mg per kilogram twice a day is also an option. -He did not get any rash after his last dose on 04/27/2018.

## 2018-05-18 NOTE — Patient Instructions (Signed)
[facility] Discharge Instructions     Thank you for choosing [facility] at [facility] to provide your oncology and hematology care.  To afford each patient quality time with our provider, please arrive at least 15 minutes before your scheduled appointment time.   If you have a lab appointment with the Cancer Center please come in thru the  Main Entrance and check in at the main information desk  You need to re-schedule your appointment should you arrive 10 or more minutes late.  We strive to give you quality time with our providers, and arriving late affects you and other patients whose appointments are after yours.  Also, if you no show three or more times for appointments you may be dismissed from the clinic at the providers discretion.     Again, thank you for choosing [facility].  Our hope is that these requests will decrease the amount of time that you wait before being seen by our physicians.       _____________________________________________________________  Should you have questions after your visit to [facility], please contact our office at [phone] between the hours of 8:00 a.m. and 4:30 p.m.  Voicemails left after 4:00 p.m. will not be returned until the following business day.  For prescription refill requests, have your pharmacy contact our office and allow 72 hours.    Cancer Center Support Programs:   > Cancer Support Group  2nd Tuesday of the month 1pm-2pm, Journey Room    

## 2018-05-18 NOTE — Progress Notes (Signed)
Tolerated infusion w/o adverse reaction.  Alert, in no distress.  VSS.  Discharged ambulatory in c/o spouse.  

## 2018-05-18 NOTE — Progress Notes (Signed)
[place], [place] [address]   CLINIC:  Medical Oncology/Hematology  PCP:  Redmond School, MD [address] 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 male returns for routine follow-up for small cell lung cancer. Patient is here today with his wife. He reports no rash from his last treatment. However he had fevers and chills a few days after treatment. He had a trip to the ER and his blood counts normalized and he was sent home. He has felt better ever since. Patient appetite is decreased and he reports it as 50%. His energy level is also 50%. Patient denies any new pains. Denies any fever or recent infections after his hospitalization. Denies any  Nausea, vomiting, or diarrhea. Denies any headaches or vision changes.     REVIEW OF SYSTEMS:  Review of Systems  Constitutional: Positive for fatigue.  Musculoskeletal: Positive for back pain.  Neurological: Positive for  dizziness and extremity weakness.  Hematological: Bruises/bleeds easily.  All other systems reviewed and are negative.    PAST MEDICAL/SURGICAL HISTORY:  Past Medical History:  Diagnosis Date  . Anxiety   . Asthma   . COPD (chronic obstructive pulmonary disease) ([place])   . DDD (degenerative disc disease)   . Diverticulitis   . GERD (gastroesophageal reflux disease)   . Headache   . Hepatitis    in the late 70's   . HTN (hypertension)   . Mediastinal adenopathy   . Seizures ([facility])    as of 2019 none for 20 years  . Sleep apnea    does not  use cpap  . Stroke [facility])    TIA - Feb. 2018   Past Surgical History:  Procedure Laterality Date  . CERVICAL DISC SURGERY     X4  . CHOLECYSTECTOMY    . COLONOSCOPY  08/24/2011   Procedure: COLONOSCOPY;  Surgeon: Daneil Dolin, MD;  Location: [facility];  Service: Endoscopy;  Laterality: N/A;  7:30AM  . ELBOW SURGERY     right  . PORTACATH PLACEMENT Right 12/01/2017   Procedure: INSERTION PORT-A-CATH;  Surgeon: Aviva Signs, MD;  Location: [facility] ORS;  Service: General;  Laterality: Right;  Marland Kitchen VIDEO BRONCHOSCOPY WITH ENDOBRONCHIAL ULTRASOUND N/A 10/31/2017   Procedure: VIDEO BRONCHOSCOPY WITH ENDOBRONCHIAL ULTRASOUND;  Surgeon: Ivin Poot, MD;  Location: [facility];  Service: Thoracic;  Laterality: N/A;  . VIDEO BRONCHOSCOPY WITH ENDOBRONCHIAL ULTRASOUND N/A 11/22/2017   Procedure: VIDEO BRONCHOSCOPY WITH ENDOBRONCHIAL ULTRASOUND;  Surgeon: [place] Nakayama, MD;  Location: [facility] OR;  Service: Thoracic;  Laterality: N/A;     SOCIAL HISTORY:  Social History   Socioeconomic History  . Marital status: Married    Spouse name: Not on file  . Number of children: Not on file  . Years of education: Not on file  . Highest education level: Not on file  Occupational History  . Occupation: disability  Social Needs  . Financial resource strain: Not on file  . Food insecurity:    Worry: Not on file    Inability: Not on file  .  Transportation needs:    Medical: Not on file    Non-medical: Not on file  Tobacco Use  . Smoking status: Former Smoker    Packs/day: 1.50    Years: 30.00    Pack years: 45.00    Types: Cigarettes    Last attempt to quit: 10/13/2017    Years since quitting: 0.5  . Smokeless tobacco: Never Used  Substance and Sexual Activity  . Alcohol use: No  . Drug use: No  . Sexual activity: Yes    Birth control/protection: None  Lifestyle  . Physical activity:    Days per week: Not on file    Minutes per session: Not on file  . Stress: Not on file  Relationships  . Social connections:    Talks on phone: Not on file    Gets together: Not on file    Attends religious service: Not on file    Active member of club or organization: Not on file    Attends meetings of clubs or organizations: Not on file    Relationship status: Not on file  . Intimate partner violence:    Fear of current or ex partner: Not on file    Emotionally abused: Not on file    Physically abused: Not on file    Forced sexual activity: Not on file  Other Topics Concern  . Not on file  Social History Narrative  . Not on file    FAMILY HISTORY:  Family History  Problem Relation Age of Onset  . Hypertension Father   . Heart attack Father   . Kidney failure Mother   . Diabetes Mother   . Hypertension Mother   . Colon cancer Neg Hx   . Anesthesia problems Neg Hx   . Hypotension Neg Hx   . Malignant hyperthermia Neg Hx   . Pseudochol deficiency Neg Hx     CURRENT MEDICATIONS:  Outpatient Encounter Medications as of 05/18/2018  Medication Sig  . albuterol (PROVENTIL HFA;VENTOLIN HFA) 108 (90 BASE) MCG/ACT inhaler Inhale 2 puffs into the lungs every 6 (six) hours as needed for wheezing or shortness of breath.   Marland Kitchen albuterol (PROVENTIL) (2.5 MG/3ML) 0.083% nebulizer solution Take 2.5 mg by nebulization every 6 (six) hours as needed for wheezing or shortness of breath.   . alprazolam (XANAX) 2 MG tablet Take 2 mg by  mouth every 6 (six) hours as needed for sleep or anxiety.   Marland Kitchen amLODipine (NORVASC) 10 MG tablet Take 10 mg by mouth daily.  Marland Kitchen amoxicillin-clavulanate (AUGMENTIN) 875-125 MG tablet Take 1 tablet by mouth every 12 (twelve) hours.  Huey Bienenstock (TECENTRIQ IV) Inject into the vein. Every 21 days  . azithromycin (ZITHROMAX) 250 MG tablet Take 2 tabs by mouth on day 1 then 1 tab daily by mouth until all taken.  Marland Kitchen CALCIUM-MAGNESIUM-ZINC PO Take 1,200 mg by mouth daily.  . COLLAGEN PO Take 7 g by mouth  daily.  . cyclobenzaprine (FLEXERIL) 10 MG tablet Take 10 mg by mouth daily as needed for muscle spasms.   . diclofenac sodium (VOLTAREN) 1 % GEL Apply 2 g topically 4 (four) times daily. Apply to left forearm phlebitic area  . docusate sodium (COLACE) 100 MG capsule Take 1 capsule (100 mg total) by mouth every 12 (twelve) hours.  . Flaxseed, Linseed, (FLAXSEED OIL PO) Take by mouth.  . fluticasone (FLONASE) 50 MCG/ACT nasal spray Place 2 sprays into both nostrils daily as needed for allergies.   . furosemide (LASIX) 20 MG tablet   . Glucosamine-Chondroit-Vit C-Mn (GLUCOSAMINE 1500 COMPLEX) CAPS Take 1 capsule by mouth.  . levETIRAcetam (KEPPRA) 500 MG tablet Take 500 mg by mouth daily.   Marland Kitchen lidocaine (XYLOCAINE) 2 % solution   . lidocaine-prilocaine (EMLA) cream APPLY TO PORTACATH SITE AS DIRECTED  . lisinopril (PRINIVIL,ZESTRIL) 20 MG tablet Take 20 mg by mouth 2 (two) times daily.  . meclizine (ANTIVERT) 12.5 MG tablet Take 1 tablet (12.5 mg total) by mouth 3 (three) times daily as needed for dizziness.  . mirtazapine (REMERON) 7.5 MG tablet Take 1 tablet (7.5 mg total) by mouth at bedtime.  . MULTIPLE VITAMINS PO Take 1 tablet by mouth.  . mupirocin ointment (BACTROBAN) 2 % APPLY TO SKIN TID PRN  . naproxen (NAPROSYN) 500 MG tablet Take 500 mg by mouth 2 (two) times daily with a meal.    . NON FORMULARY SWISH AND SWALLOW 15 MLS PO QID  . NONFORMULARY OR COMPOUNDED ITEM SWISH AND SWALLOW 15 MLS PO  QID  . oxyCODONE (ROXICODONE) 15 MG immediate release tablet Take 15 mg by mouth 4 (four) times daily as needed for pain.   . pantoprazole (PROTONIX) 40 MG tablet   . polyethylene glycol (MIRALAX / GLYCOLAX) packet Take 17 g by mouth daily.  . pravastatin (PRAVACHOL) 20 MG tablet   . predniSONE (DELTASONE) 10 MG tablet Take 1 tablet (10 mg total) by mouth daily with breakfast.  . tamsulosin (FLOMAX) 0.4 MG CAPS capsule Take 0.4 mg by mouth daily.   . temazepam (RESTORIL) 30 MG capsule TAKE 1 CAPSULE AT BEDTIME AS NEEDED FOR SLEEP  . triamcinolone cream (KENALOG) 0.1 %   . TURMERIC PO Take 4 tablets by mouth.  . [DISCONTINUED] prochlorperazine (COMPAZINE) 10 MG tablet Take 1 tablet (10 mg total) by mouth every 6 (six) hours as needed (Nausea or vomiting).   No facility-administered encounter medications on file as of 05/18/2018.     ALLERGIES:  Allergies  Allergen Reactions  . Xgeva [Denosumab]     Rash   . Tecentriq [Atezolizumab] Rash     PHYSICAL EXAM:  ECOG Performance status: 1  VITAL SIGNS:BP: 141/71, P:65, R:18, T:98.2, SATS:99% Weight: 192  Physical Exam  Constitutional: He is oriented to person, place, and time. He appears well-developed and well-nourished.  Cardiovascular: Normal rate, regular rhythm and normal heart sounds.  Pulmonary/Chest: Effort normal and breath sounds normal.  Abdominal: Soft.  Musculoskeletal: Normal range of motion.  Neurological: He is alert and oriented to person, place, and time.  Skin: Skin is warm and dry.  Psychiatric: He has a normal mood and affect. His behavior is normal. Judgment and thought content normal.  There is a mild erythematous macular rash in the right groin region measuring 1 cm, looks stable.  No other rashes seen.   LABORATORY DATA:  I have reviewed the labs as listed.  CBC    Component Value Date/Time   WBC 6.8  05/18/2018 0806   RBC 4.58 05/18/2018 0806   HGB 14.4 05/18/2018 0806   HCT 43.1 05/18/2018 0806    PLT 180 05/18/2018 0806   MCV 94.1 05/18/2018 0806   MCH 31.4 05/18/2018 0806   MCHC 33.4 05/18/2018 0806   RDW 13.1 05/18/2018 0806   LYMPHSABS 1.7 05/18/2018 0806   MONOABS 0.7 05/18/2018 0806   EOSABS 0.1 05/18/2018 0806   BASOSABS 0.1 05/18/2018 0806   CMP Latest Ref Rng & Units 05/18/2018 04/28/2018 04/27/2018  Glucose 70 - 99 mg/dL 112(H) 123(H) 110(H)  BUN 6 - 20 mg/dL 23(H) 17 24(H)  Creatinine 0.61 - 1.24 mg/dL 1.13 1.04 1.22  Sodium 135 - 145 mmol/L 140 135 138  Potassium 3.5 - 5.1 mmol/L 3.6 4.8 4.6  Chloride 98 - 111 mmol/L 104 106 101  CO2 22 - 32 mmol/L 26 23 30   Calcium 8.9 - 10.3 mg/dL 9.4 8.6(L) 8.8(L)  Total Protein 6.5 - 8.1 g/dL 7.0 6.4(L) 6.4(L)  Total Bilirubin 0.3 - 1.2 mg/dL 0.7 0.8 0.8  Alkaline Phos 38 - 126 U/L 55 45 44  AST 15 - 41 U/L 19 23 19   ALT 0 - 44 U/L 22 48(H) 40         ASSESSMENT & PLAN:   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  Extensive stage small cell lung cancer: -4 cycles of carboplatin, etoposide and Atezolizumab from 11/27/2017 through 01/29/2018 -CT scan of the chest, abdomen after 3 cycles of chemo immunotherapy on 01/25/2018 shows near complete resolution of the adenopathy and lung mass.  Sclerosis and bone mets noted.  - Maintenance Atezolizumab started on 02/28/2018, second dose on 03/22/2018, each time resulting in severe urticarial rash on the upper trunk and lower extremities, improved on steroid therapy. -He has gotten severe urticarial rash from Atezolizumab.  I have discussed the results of the CT scan of the chest, abdomen and pelvis dated 04/06/2018 which showed no evidence of disease progression.  Previously measured mediastinal lymph nodes are barely detectable.  Dominant left hilar lymph node measures 2.1 x 0.9 cm.  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  Mild T11 compression fracture with less than 10% height loss was also seen. - I have sent him for a second opinion with Dr. Elmyra Ricks at [facility]. -He was evaluated by Dr. Berta Minor on 04/19/2018.  He was also referred to [facility] dermatology Dr. Royanne Foots. -It was suggested to give another trial of Atezolizumab.  He discontinued dexamethasone 4 mg daily and started taking prednisone 10 mg daily.  He was also prescribed triamcinolone 0.1% cream to be used twice daily. - He received last Atezolizumab on 04/27/2018.  He developed a fever of 102 degrees on the same day night.  He presented to the ER next day.  No source of infection was found.  CBC with differential was normal.  The fever subsided by itself. -he did not experience any rash after last immunotherapy.  He was also evaluated by dermatology at [facility]. - He may proceed with Atezolizumab today.  He was asked to call his, should he develop any fever or rash.  We will see him back in 3 weeks and repeat CT scan of the chest, abdomen and pelvis.  We will plan to repeat MRI of the brain every 4 months.  2. Bone metastasis: - He was started on denosumab on 02/02/2018.  Received second dose on 03/02/2018. -He had urticarial rashes which was was thought to be  secondary to denosumab.  Hence it was discontinued.  He was started on Zometa on 03/30/2018.  He will come back for Zometa next week.  His labs are adequate to proceed with it.  3.  Skin rash: - He had severe skin rash (lichenoid dermatitis) on the trunk and upper thighs after each dose of Atezolizumab.  This responded very well to steroids. -She was evaluated by Dr. Koleen Distance [facility].  She prescribed him triamcinolone 0.1% cream to be applied twice daily. -She recommended to give another trial of Atezolizumab.  He will continue prednisone 10 mg daily.  He will continue to use the triamcinolone cream.  She would like to see him 4 to 5 days after Atezolizumab dose.  Hence we have made a follow-up visit with her on 05/03/2018.  He gets diffuse rash typically 7 days after each dose of  Atezolizumab. - She would consider starting him on [place].  Cyclosporine at a dose of 1.5 mg to 2.5 mg per kilogram twice a day is also an option. -He did not get any rash after his last dose on 04/27/2018.      Orders placed this encounter:  Orders Placed This Encounter  Procedures  . CT Chest W Contrast  . CT Abdomen Pelvis W Contrast  . Lactate dehydrogenase  . TSH  . CBC with Differential/Platelet  . Comprehensive metabolic panel      Derek Jack, MD [place] [phone]

## 2018-05-25 ENCOUNTER — Inpatient Hospital Stay (HOSPITAL_COMMUNITY): Payer: Medicare HMO

## 2018-05-25 ENCOUNTER — Ambulatory Visit (HOSPITAL_COMMUNITY)
Admission: RE | Admit: 2018-05-25 | Discharge: 2018-05-25 | Disposition: A | Discharge status: Home / Self Care | Payer: Medicare HMO | Source: Ambulatory Visit | Attending: Internal Medicine | Admitting: Internal Medicine

## 2018-05-25 VITALS — BP 134/79 | HR 101 | Temp 97.7°F | Resp 18 | Wt 196.4 lb

## 2018-05-25 DIAGNOSIS — I10 Essential (primary) hypertension: Secondary | ICD-10-CM | POA: Diagnosis not present

## 2018-05-25 DIAGNOSIS — R0602 Shortness of breath: Secondary | ICD-10-CM | POA: Diagnosis not present

## 2018-05-25 DIAGNOSIS — Z87891 Personal history of nicotine dependence: Secondary | ICD-10-CM | POA: Diagnosis not present

## 2018-05-25 DIAGNOSIS — Z79899 Other long term (current) drug therapy: Secondary | ICD-10-CM | POA: Diagnosis not present

## 2018-05-25 DIAGNOSIS — R0789 Other chest pain: Secondary | ICD-10-CM | POA: Diagnosis not present

## 2018-05-25 DIAGNOSIS — C7951 Secondary malignant neoplasm of bone: Secondary | ICD-10-CM

## 2018-05-25 DIAGNOSIS — Z9221 Personal history of antineoplastic chemotherapy: Secondary | ICD-10-CM | POA: Diagnosis not present

## 2018-05-25 DIAGNOSIS — C3492 Malignant neoplasm of unspecified part of left bronchus or lung: Secondary | ICD-10-CM | POA: Diagnosis not present

## 2018-05-25 DIAGNOSIS — C3490 Malignant neoplasm of unspecified part of unspecified bronchus or lung: Secondary | ICD-10-CM

## 2018-05-25 DIAGNOSIS — L309 Dermatitis, unspecified: Secondary | ICD-10-CM | POA: Diagnosis not present

## 2018-05-25 DIAGNOSIS — J449 Chronic obstructive pulmonary disease, unspecified: Secondary | ICD-10-CM | POA: Diagnosis not present

## 2018-05-25 DIAGNOSIS — Z7952 Long term (current) use of systemic steroids: Secondary | ICD-10-CM | POA: Diagnosis not present

## 2018-05-25 DIAGNOSIS — Z5112 Encounter for antineoplastic immunotherapy: Secondary | ICD-10-CM | POA: Diagnosis not present

## 2018-05-25 LAB — COMPREHENSIVE METABOLIC PANEL
ALT: 21 U/L (ref 0–44)
ANION GAP: 11 (ref 5–15)
AST: 22 U/L (ref 15–41)
Albumin: 4.2 g/dL (ref 3.5–5.0)
Alkaline Phosphatase: 61 U/L (ref 38–126)
BILIRUBIN TOTAL: 0.7 mg/dL (ref 0.3–1.2)
BUN: 17 mg/dL (ref 6–20)
CALCIUM: 9.4 mg/dL (ref 8.9–10.3)
CO2: 24 mmol/L (ref 22–32)
Chloride: 103 mmol/L (ref 98–111)
Creatinine, Ser: 1.27 mg/dL — ABNORMAL HIGH (ref 0.61–1.24)
GFR calc Af Amer: >60 mL/min (ref >60)
GFR calc non Af Amer: >60 mL/min (ref >60)
Glucose, Bld: 171 mg/dL — ABNORMAL HIGH (ref 70–99)
POTASSIUM: 3.5 mmol/L (ref 3.5–5.1)
Sodium: 138 mmol/L (ref 135–145)
TOTAL PROTEIN: 7.3 g/dL (ref 6.5–8.1)

## 2018-05-25 LAB — CBC WITH DIFFERENTIAL/PLATELET
Basophils Absolute: 0 10*3/uL (ref 0.0–0.1)
Basophils Relative: 0 %
Eosinophils Absolute: 0.2 10*3/uL (ref 0.0–0.7)
Eosinophils Relative: 2 %
HEMATOCRIT: 44.4 % (ref 39.0–52.0)
Hemoglobin: 14.8 g/dL (ref 13.0–17.0)
LYMPHS PCT: 18 %
Lymphs Abs: 1.4 10*3/uL (ref 0.7–4.0)
MCH: 30.8 pg (ref 26.0–34.0)
MCHC: 33.3 g/dL (ref 30.0–36.0)
MCV: 92.5 fL (ref 78.0–100.0)
MONO ABS: 0.5 10*3/uL (ref 0.1–1.0)
MONOS PCT: 6 %
NEUTROS ABS: 5.9 10*3/uL (ref 1.7–7.7)
Neutrophils Relative %: 74 %
Platelets: 179 10*3/uL (ref 150–400)
RBC: 4.8 MIL/uL (ref 4.22–5.81)
RDW: 13.3 % (ref 11.5–15.5)
WBC: 8 10*3/uL (ref 4.0–10.5)

## 2018-05-25 MED ORDER — HEPARIN SOD (PORK) LOCK FLUSH 100 UNIT/ML IV SOLN
500.0000 [IU] | Freq: Once | INTRAVENOUS | Status: AC
  Administered 2018-05-25: 500 [IU] via INTRAVENOUS

## 2018-05-25 MED ORDER — ZOLEDRONIC ACID 4 MG/100ML IV SOLN
4.0000 mg | Freq: Once | INTRAVENOUS | Status: AC
  Administered 2018-05-25: 4 mg via INTRAVENOUS
  Filled 2018-05-25: qty 100

## 2018-05-25 MED ORDER — SODIUM CHLORIDE 0.9 % IV SOLN
INTRAVENOUS | Status: DC
  Administered 2018-05-25: 14:00:00 via INTRAVENOUS

## 2018-05-25 NOTE — Patient Instructions (Signed)
[place] at [facility] Discharge Instructions  zometa today Follow up scheduled. Instructed patient to go to the emergency room if he gets any worse.    Thank you for choosing [place] at [facility] to provide your oncology and hematology care.  To afford each patient quality time with our provider, please arrive at least 15 minutes before your scheduled appointment time.   If you have a lab appointment with the [facility] please come in thru the  Main Entrance and check in at the main information desk  You need to re-schedule your appointment should you arrive 10 or more minutes late.  We strive to give you quality time with our providers, and arriving late affects you and other patients whose appointments are after yours.  Also, if you no show three or more times for appointments you may be dismissed from the clinic at the providers discretion.     Again, thank you for choosing [facility].  Our hope is that these requests will decrease the amount of time that you wait before being seen by our physicians.       _____________________________________________________________  Should you have questions after your visit to [facility], please contact our office at [phone] between the hours of 8:00 a.m. and 4:30 p.m.  Voicemails left after 4:00 p.m. will not be returned until the following business day.  For prescription refill requests, have your pharmacy contact our office and allow 72 hours.    Cancer Center Support Programs:   > Cancer Support Group  2nd Tuesday of the month 1pm-2pm, Journey Room

## 2018-05-28 ENCOUNTER — Encounter (HOSPITAL_COMMUNITY): Payer: Self-pay

## 2018-05-28 ENCOUNTER — Other Ambulatory Visit: Payer: Self-pay

## 2018-05-28 NOTE — Progress Notes (Signed)
Late entry from infusion appt. on September the 16th.    Patient presenting today for zometa infusion, patient's HR intially 120 . Patient is having trouble breathing,O2 sats 100%. states he feels tight feeling when he breathes. Patient stated he has felt like this for about a week now. He did take a breathing treatment this morning before he came for treatment. He states that he feels worse after breathing treatment. Discussed with Dr. Walden Field, ordered a chest xray . Patient went down for that.   After xray, zometa administered, patient vitals are stable, patient asked for oxygen to see if that would help, O2 at 2 liters applied. Chest xray results normal. Patient asked me to call primary MD to get oxygen reordered due to he had not been on any oxygen in 4-5 years. Primary stated that he should go to the emergency room to be evaluated . Patient refused to go to the ER at this time. After infusion and resting , his breathing got a little better, patient stated he was ready to leave and refused a wheelchair when offered. He stated he wanted to walk not ride.   Treatment given per orders. Patient tolerated it well without problems. Vitals stable and discharged home from clinic ambulatory. Follow up as scheduled.

## 2018-06-01 DIAGNOSIS — M47812 Spondylosis without myelopathy or radiculopathy, cervical region: Secondary | ICD-10-CM | POA: Diagnosis not present

## 2018-06-04 ENCOUNTER — Emergency Department (HOSPITAL_COMMUNITY): Payer: Medicare HMO

## 2018-06-04 ENCOUNTER — Emergency Department (HOSPITAL_COMMUNITY)
Admission: EM | Admit: 2018-06-04 | Discharge: 2018-06-04 | Disposition: A | Discharge status: Home / Self Care | Payer: Medicare HMO | Attending: Emergency Medicine | Admitting: Emergency Medicine

## 2018-06-04 ENCOUNTER — Other Ambulatory Visit: Payer: Self-pay

## 2018-06-04 ENCOUNTER — Encounter (HOSPITAL_COMMUNITY): Payer: Self-pay | Admitting: Emergency Medicine

## 2018-06-04 DIAGNOSIS — J449 Chronic obstructive pulmonary disease, unspecified: Secondary | ICD-10-CM | POA: Insufficient documentation

## 2018-06-04 DIAGNOSIS — Z8673 Personal history of transient ischemic attack (TIA), and cerebral infarction without residual deficits: Secondary | ICD-10-CM | POA: Diagnosis not present

## 2018-06-04 DIAGNOSIS — I10 Essential (primary) hypertension: Secondary | ICD-10-CM | POA: Diagnosis not present

## 2018-06-04 DIAGNOSIS — Z79899 Other long term (current) drug therapy: Secondary | ICD-10-CM | POA: Insufficient documentation

## 2018-06-04 DIAGNOSIS — Z87891 Personal history of nicotine dependence: Secondary | ICD-10-CM | POA: Diagnosis not present

## 2018-06-04 DIAGNOSIS — E785 Hyperlipidemia, unspecified: Secondary | ICD-10-CM | POA: Insufficient documentation

## 2018-06-04 DIAGNOSIS — M545 Low back pain: Secondary | ICD-10-CM | POA: Insufficient documentation

## 2018-06-04 DIAGNOSIS — C7951 Secondary malignant neoplasm of bone: Secondary | ICD-10-CM | POA: Diagnosis not present

## 2018-06-04 DIAGNOSIS — R109 Unspecified abdominal pain: Secondary | ICD-10-CM | POA: Diagnosis not present

## 2018-06-04 DIAGNOSIS — E119 Type 2 diabetes mellitus without complications: Secondary | ICD-10-CM | POA: Diagnosis not present

## 2018-06-04 DIAGNOSIS — R0602 Shortness of breath: Secondary | ICD-10-CM | POA: Diagnosis not present

## 2018-06-04 DIAGNOSIS — C3490 Malignant neoplasm of unspecified part of unspecified bronchus or lung: Secondary | ICD-10-CM | POA: Diagnosis not present

## 2018-06-04 LAB — CBC WITH DIFFERENTIAL/PLATELET
Basophils Absolute: 0 10*3/uL (ref 0.0–0.1)
Basophils Relative: 0 %
EOS ABS: 0.1 10*3/uL (ref 0.0–0.7)
Eosinophils Relative: 1 %
HCT: 41.6 % (ref 39.0–52.0)
HEMOGLOBIN: 14.1 g/dL (ref 13.0–17.0)
LYMPHS ABS: 0.8 10*3/uL (ref 0.7–4.0)
Lymphocytes Relative: 7 %
MCH: 31.4 pg (ref 26.0–34.0)
MCHC: 33.9 g/dL (ref 30.0–36.0)
MCV: 92.7 fL (ref 78.0–100.0)
Monocytes Absolute: 0.5 10*3/uL (ref 0.1–1.0)
Monocytes Relative: 5 %
NEUTROS PCT: 87 %
Neutro Abs: 10.1 10*3/uL — ABNORMAL HIGH (ref 1.7–7.7)
PLATELETS: 197 10*3/uL (ref 150–400)
RBC: 4.49 MIL/uL (ref 4.22–5.81)
RDW: 13.5 % (ref 11.5–15.5)
WBC: 11.4 10*3/uL — AB (ref 4.0–10.5)

## 2018-06-04 LAB — COMPREHENSIVE METABOLIC PANEL
ALBUMIN: 4.1 g/dL (ref 3.5–5.0)
ALT: 17 U/L (ref 0–44)
AST: 22 U/L (ref 15–41)
Alkaline Phosphatase: 65 U/L (ref 38–126)
Anion gap: 11 (ref 5–15)
BUN: 18 mg/dL (ref 6–20)
CHLORIDE: 102 mmol/L (ref 98–111)
CO2: 25 mmol/L (ref 22–32)
CREATININE: 1.08 mg/dL (ref 0.61–1.24)
Calcium: 9.5 mg/dL (ref 8.9–10.3)
GFR calc Af Amer: >60 mL/min (ref >60)
GFR calc non Af Amer: >60 mL/min (ref >60)
Glucose, Bld: 133 mg/dL — ABNORMAL HIGH (ref 70–99)
Potassium: 4.3 mmol/L (ref 3.5–5.1)
Sodium: 138 mmol/L (ref 135–145)
Total Bilirubin: 0.7 mg/dL (ref 0.3–1.2)
Total Protein: 7.3 g/dL (ref 6.5–8.1)

## 2018-06-04 LAB — LIPASE, BLOOD: Lipase: 31 U/L (ref 11–51)

## 2018-06-04 MED ORDER — SODIUM CHLORIDE 0.9 % IV BOLUS
500.0000 mL | Freq: Once | INTRAVENOUS | Status: AC
  Administered 2018-06-04: 500 mL via INTRAVENOUS

## 2018-06-04 MED ORDER — IOPAMIDOL (ISOVUE-370) INJECTION 76%
100.0000 mL | Freq: Once | INTRAVENOUS | Status: AC | PRN
  Administered 2018-06-04: 100 mL via INTRAVENOUS

## 2018-06-04 MED ORDER — HYDROMORPHONE HCL 1 MG/ML IJ SOLN
1.0000 mg | Freq: Once | INTRAMUSCULAR | Status: AC
  Administered 2018-06-04: 1 mg via INTRAVENOUS
  Filled 2018-06-04: qty 1

## 2018-06-04 NOTE — Discharge Instructions (Addendum)
Follow-up closely with your cancer doctor.  Remind him that we did your CAT scans today so they do not repeat it. Take your pain medicines at home as directed.

## 2018-06-04 NOTE — ED Triage Notes (Signed)
Pt reports lower back pain with hx of cancer mets to spine.  States 3 days ago it became worse and pain meds are not helping.  Last pain meds yesterday.

## 2018-06-04 NOTE — ED Provider Notes (Signed)
[facility] EMERGENCY DEPARTMENT Provider Note   CSN: [id number] Arrival date & time: 06/04/18  0827     History   Chief Complaint Chief Complaint  Patient presents with  . Back Pain    HPI [name] [name] is a 57 y.o. male.  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  This is overall similar but definitely more significant.  Patient takes oral Percocet as needed once daily.  Patient denies any new neurologic concerns.  Patient has outpatient follow-up for CTs coming up.  No fevers or chills.  No new injuries.     Past Medical History:  Diagnosis Date  . Anxiety   . Asthma   . COPD (chronic obstructive pulmonary disease) ([place])   . DDD (degenerative disc disease)   . Diverticulitis   . GERD (gastroesophageal reflux disease)   . Headache   . Hepatitis    in the late 70's   . HTN (hypertension)   . Mediastinal adenopathy   . Seizures ([place])    as of 2019 none for 20 years  . Sleep apnea    does not  use cpap  . Stroke [facility])    TIA - Feb. 2018    Patient Active Problem List   Diagnosis Date Noted  . Bone metastasis ([place]) 01/29/2018  . Malignant neoplasm of bronchus and lung ([place])   . 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 Goals of care, counseling/discussion 11/23/2017  . Hyperlipidemia 01/03/2017  . Family history of heart disease 01/03/2017  . Blurry vision 10/26/2016  . Slurred speech 10/26/2016  . Cerebrovascular disease 10/26/2016  . Chest pain 05/10/2012  . HTN (hypertension) 05/10/2012  . Chronic pain 05/10/2012  . Diabetes mellitus ([employer]) 05/10/2012  . Screening for colon cancer 07/25/2011    Past Surgical History:  Procedure Laterality Date  . CERVICAL DISC SURGERY     X4  . CHOLECYSTECTOMY    . COLONOSCOPY  08/24/2011    Procedure: COLONOSCOPY;  Surgeon: Daneil Dolin, MD;  Location: [facility] ENDO SUITE;  Service: Endoscopy;  Laterality: N/A;  7:30AM  . ELBOW SURGERY     right  . PORTACATH PLACEMENT Right 12/01/2017   Procedure: INSERTION PORT-A-CATH;  Surgeon: Aviva Signs, MD;  Location: [facility] ORS;  Service: General;  Laterality: Right;  Marland Kitchen VIDEO BRONCHOSCOPY WITH ENDOBRONCHIAL ULTRASOUND N/A 10/31/2017   Procedure: VIDEO BRONCHOSCOPY WITH ENDOBRONCHIAL ULTRASOUND;  Surgeon: Ivin Poot, MD;  Location: [place];  Service: Thoracic;  Laterality: N/A;  . VIDEO BRONCHOSCOPY WITH ENDOBRONCHIAL ULTRASOUND N/A 11/22/2017   Procedure: VIDEO BRONCHOSCOPY WITH ENDOBRONCHIAL ULTRASOUND;  Surgeon: Melrose Nakayama, MD;  Location: [place];  Service: Thoracic;  Laterality: N/A;        Home Medications    Prior to Admission medications   Medication Sig Start Date End Date Taking? Authorizing Provider  albuterol (PROVENTIL HFA;VENTOLIN HFA) 108 (90 BASE) MCG/ACT inhaler Inhale 2 puffs into the lungs every 6 (six) hours as needed for wheezing or shortness of breath.    Yes [provider]  albuterol (PROVENTIL) (2.5 MG/3ML) 0.083% nebulizer solution Take 2.5 mg by nebulization every 6 (six) hours as needed for wheezing or shortness of breath.    Yes [provider]  alprazolam Duanne Moron) 2 MG tablet Take 2 mg by mouth  every 6 (six) hours as needed for sleep or anxiety.    Yes [provider]  amLODipine (NORVASC) 10 MG tablet Take 10 mg by mouth daily.   Yes [provider]  Atezolizumab (TECENTRIQ IV) Inject into the vein. Every 21 days   Yes [provider]  CALCIUM-MAGNESIUM-ZINC PO Take 1,200 mg by mouth daily.   Yes [provider]  COLLAGEN PO Take 7 g by mouth daily.   Yes [provider]  cyclobenzaprine (FLEXERIL) 10 MG tablet Take 10 mg by mouth daily as needed for muscle spasms.  07/11/16  Yes [provider]  diclofenac sodium (VOLTAREN) 1 %  GEL Apply 2 g topically 4 (four) times daily. Apply to left forearm phlebitic area 01/08/18  Yes Derek Jack, MD  docusate sodium (COLACE) 100 MG capsule Take 1 capsule (100 mg total) by mouth every 12 (twelve) hours. 11/27/17  Yes Mesner, Corene Cornea, MD  Flaxseed, Linseed, (FLAXSEED OIL PO) Take 1 tablet by mouth 2 (two) times daily.    Yes [provider]  fluticasone (FLONASE) 50 MCG/ACT nasal spray Place 2 sprays into both nostrils daily as needed for allergies.  07/11/16  Yes [provider]  furosemide (LASIX) 20 MG tablet Take 10 mg by mouth daily.  04/10/18  Yes [provider]  Glucosamine-Chondroit-Vit C-Mn (GLUCOSAMINE 1500 COMPLEX) CAPS Take 1 capsule by mouth.   Yes [provider]  levETIRAcetam (KEPPRA) 500 MG tablet Take 500 mg by mouth daily.  11/29/16  Yes [provider]  lidocaine (XYLOCAINE) 2 % solution Use as directed 15 mLs in the mouth or throat every 4 (four) hours as needed for mouth pain.  01/24/18  Yes [provider]  lidocaine-prilocaine (EMLA) cream APPLY TO PORTACATH SITE AS DIRECTED 01/22/18  Yes Derek Jack, MD  lisinopril (PRINIVIL,ZESTRIL) 20 MG tablet Take 20 mg by mouth 2 (two) times daily.   Yes [provider]  meclizine (ANTIVERT) 12.5 MG tablet Take 1 tablet (12.5 mg total) by mouth 3 (three) times daily as needed for dizziness. 10/28/16  Yes Florencia Reasons, MD  mirtazapine (REMERON) 7.5 MG tablet Take 1 tablet (7.5 mg total) by mouth at bedtime. 03/22/18  Yes Derek Jack, MD  MULTIPLE VITAMINS PO Take 1 tablet by mouth daily.    Yes [provider]  mupirocin ointment (BACTROBAN) 2 % APPLY TO SKIN TID PRN 02/15/18  Yes [provider]  naproxen (NAPROSYN) 500 MG tablet Take 500 mg by mouth 2 (two) times daily with a meal.     Yes [provider]  oxyCODONE (ROXICODONE) 15 MG immediate release tablet Take 15 mg by mouth 4 (four) times daily as needed for pain.     Yes [provider]  pantoprazole (PROTONIX) 40 MG tablet Take 40 mg by mouth at bedtime.  02/09/18  Yes [provider]  polyethylene glycol (MIRALAX / GLYCOLAX) packet Take 17 g by mouth daily. 11/27/17  Yes Mesner, Corene Cornea, MD  pravastatin (PRAVACHOL) 20 MG tablet Take 20 mg by mouth daily.  02/09/18  Yes [provider]  predniSONE (DELTASONE) 10 MG tablet Take 1 tablet (10 mg total) by mouth daily with breakfast. 04/27/18  Yes Lockamy, Randi L, NP-C  tamsulosin (FLOMAX) 0.4 MG CAPS capsule Take 0.4 mg by mouth daily.  07/11/16  Yes [provider]  temazepam (RESTORIL) 30 MG capsule TAKE 1 CAPSULE AT BEDTIME AS NEEDED FOR SLEEP 03/28/18  Yes Lockamy, Randi L, NP-C  triamcinolone cream (KENALOG) 0.1 %  Apply 1 application topically 2 (two) times daily.  04/23/18  Yes [provider]  TURMERIC PO Take 4 tablets by mouth daily.    Yes [provider]  amoxicillin-clavulanate (AUGMENTIN) 875-125 MG tablet Take 1 tablet by mouth every 12 (twelve) hours. Patient not taking: Reported on 06/04/2018 04/28/18   Isla Pence, MD  prochlorperazine (COMPAZINE) 10 MG tablet Take 1 tablet (10 mg total) by mouth every 6 (six) hours as needed (Nausea or vomiting). 11/23/17 02/22/18  Derek Jack, MD    Family History Family History  Problem Relation Age of Onset  . Hypertension Father   . Heart attack Father   . Kidney failure Mother   . Diabetes Mother   . Hypertension Mother   . Colon cancer Neg Hx   . Anesthesia problems Neg Hx   . Hypotension Neg Hx   . Malignant hyperthermia Neg Hx   . Pseudochol deficiency Neg Hx     Social History Social History   Tobacco Use  . Smoking status: Former Smoker    Packs/day: 1.50    Years: 30.00    Pack years: 45.00    Types: Cigarettes    Last attempt to quit: 10/13/2017    Years since quitting: 0.6  . Smokeless tobacco: Never Used  Substance Use Topics  . Alcohol use: No  . Drug use: No      Allergies   Xgeva [denosumab] and Tecentriq [atezolizumab]   Review of Systems Review of Systems  Constitutional: Negative for chills and fever.  HENT: Negative for congestion.   Eyes: Negative for visual disturbance.  Respiratory: Positive for shortness of breath.   Cardiovascular: Negative for chest pain.  Gastrointestinal: Positive for abdominal pain. Negative for vomiting.  Genitourinary: Negative for dysuria and flank pain.  Musculoskeletal: Positive for back pain. Negative for neck pain and neck stiffness.  Skin: Negative for rash.  Neurological: Negative for light-headedness and headaches.     Physical Exam Updated Vital Signs BP (!) 149/81   Pulse 79   Temp 98.6 F (37 C) (Oral)   Resp 20   Ht 5\' 7"  (1.702 m)   Wt 88.9 kg   SpO2 96%   BMI 30.70 kg/m   Physical Exam  Constitutional: He is oriented to person, place, and time. He appears well-developed and well-nourished.  HENT:  Head: Normocephalic and atraumatic.  Eyes: Conjunctivae are normal. Right eye exhibits no discharge. Left eye exhibits no discharge.  Neck: Neck supple. No tracheal deviation present.  Cardiovascular: Normal rate and regular rhythm.  Pulmonary/Chest: Effort normal. He has wheezes (few scattered wheezes).  Abdominal: Soft. He exhibits no distension. There is tenderness (diffuse mild). There is no guarding.  Musculoskeletal: He exhibits tenderness. He exhibits no edema.  Patient has mild tenderness midline lumbar and right posterior iliac crest.  Patient has 5+ strength flexion-extension of major joints of the lower extremity.  Sensation intact to palpation in major nerves.  Neurological: He is alert and oriented to person, place, and time.  Skin: Skin is warm. No rash noted.  Psychiatric: He has a normal mood and affect.  Nursing note and vitals reviewed.    ED Treatments / Results  Labs (all labs ordered are listed, but only abnormal results are displayed) Labs Reviewed  CBC  WITH DIFFERENTIAL/PLATELET - Abnormal; Notable for the following components:      Result Value   WBC 11.4 (*)    Neutro Abs 10.1 (*)    All other components within normal limits  COMPREHENSIVE METABOLIC PANEL - Abnormal; Notable for the following components:   Glucose, Bld 133 (*)    All other components within normal limits  LIPASE, BLOOD    EKG None  Radiology Ct Angio Chest Pe W And/or Wo Contrast  Result Date: 06/04/2018 CLINICAL DATA:  Shortness of breath, acute right flank and lower back pain. History of metastatic lung cancer. EXAM: CT ANGIOGRAPHY CHEST CT ABDOMEN AND PELVIS WITH CONTRAST TECHNIQUE: Multidetector CT imaging of the chest was performed using the standard protocol during bolus administration of intravenous contrast. Multiplanar CT image reconstructions and MIPs were obtained to evaluate the vascular anatomy. Multidetector CT imaging of the abdomen and pelvis was performed using the standard protocol during bolus administration of intravenous contrast. CONTRAST:  159mL ISOVUE-370 IOPAMIDOL (ISOVUE-370) INJECTION 76% COMPARISON:  CT scan April 06, 2018. FINDINGS: CTA CHEST FINDINGS Cardiovascular: Satisfactory opacification of the pulmonary arteries to the segmental level. No evidence of pulmonary embolism. However, there is significant narrowing of the lower lobe branch of the left pulmonary artery due to surrounding adenopathy. Normal heart size. No pericardial effusion. There is no evidence of thoracic aortic dissection or aneurysm. Mediastinum/Nodes: Thyroid gland and esophagus are unremarkable. There is significant progression of left hilar adenopathy compared to prior exam, currently measuring 4.0 x 2.3 cm in size. 6 mm nodule is noted in aortopulmonary window which is increased in size compared to prior exam. 13 mm subcarinal lymph node is noted which is increased compared to prior exam. Lungs/Pleura: No pneumothorax or pleural effusion is noted. Grossly stable 4 mm  subpleural nodule is noted in left upper lobe laterally best seen on image number 54 of series 8. Grossly stable 4 mm nodule is noted in the adjacent region with stable associated ground-glass opacity. Right lung is clear. No new abnormality is noted. Musculoskeletal: Stable sclerotic densities are noted at T3 and T11 as well as T10 consistent with metastatic disease. Review of the MIP images confirms the above findings. CT ABDOMEN and PELVIS FINDINGS Hepatobiliary: No focal liver abnormality is seen. Status post cholecystectomy. No biliary dilatation. Pancreas: Unremarkable. No pancreatic ductal dilatation or surrounding inflammatory changes. Spleen: Normal in size without focal abnormality. Adrenals/Urinary Tract: Adrenal glands are unremarkable. Kidneys are normal, without renal calculi, focal lesion, or hydronephrosis. Bladder is unremarkable. Stomach/Bowel: Stomach is within normal limits. Appendix appears normal. No evidence of bowel wall thickening, distention, or inflammatory changes. Vascular/Lymphatic: No significant vascular findings are present. No enlarged abdominal or pelvic lymph nodes. Reproductive: Prostate is unremarkable. Other: No abdominal wall hernia or abnormality. No abdominopelvic ascites. Musculoskeletal: Stable sclerotic density seen and L5 consistent with metastatic disease. Severe degenerative disc disease is noted at L2-3. Stable right iliac sclerotic density is noted consistent with metastatic disease. Review of the MIP images confirms the above findings. IMPRESSION: No definite evidence of pulmonary embolus. However, there is severe narrowing of the lower lobe branch of the left pulmonary artery and its more peripheral vessels secondary to external compression from significantly increased left hilar adenopathy, due to metastatic disease. Subcarinal and aortopulmonary window adenopathy is also noted consistent with metastatic disease which has progressed since prior exam. Stable  subpleural nodularity and ground-glass opacity seen laterally in left upper lobe. Continued CT follow-up is recommended to ensure stability. Stable sclerotic lesions are noted in the thoracic and lumbar spine as well as the pelvis consistent with osseous metastases. Electronically Signed   By: Marijo Conception, M.D.   On: 06/04/2018 12:37   Ct Abdomen Pelvis W Contrast  Result Date: 06/04/2018 CLINICAL DATA:  Shortness of breath, acute right flank and lower back pain. History of metastatic lung cancer. EXAM: CT ANGIOGRAPHY CHEST CT ABDOMEN AND PELVIS WITH CONTRAST TECHNIQUE: Multidetector CT imaging of the chest was performed using the standard protocol during bolus administration of intravenous contrast. Multiplanar CT image reconstructions and MIPs were obtained to evaluate the vascular anatomy. Multidetector CT imaging of the abdomen and pelvis was performed using the standard protocol during bolus administration of intravenous contrast. CONTRAST:  159mL ISOVUE-370 IOPAMIDOL (ISOVUE-370) INJECTION 76% COMPARISON:  CT scan April 06, 2018. FINDINGS: CTA CHEST FINDINGS Cardiovascular: Satisfactory opacification of the pulmonary arteries to the segmental level. No evidence of pulmonary embolism. However, there is significant narrowing of the lower lobe branch of the left pulmonary artery due to surrounding adenopathy. Normal heart size. No pericardial effusion. There is no evidence of thoracic aortic dissection or aneurysm. Mediastinum/Nodes: Thyroid gland and esophagus are unremarkable. There is significant progression of left hilar adenopathy compared to prior exam, currently measuring 4.0 x 2.3 cm in size. 6 mm nodule is noted in aortopulmonary window which is increased in size compared to prior exam. 13 mm subcarinal lymph node is noted which is increased compared to prior exam. Lungs/Pleura: No pneumothorax or pleural effusion is noted. Grossly stable 4 mm subpleural nodule is noted in left upper lobe  laterally best seen on image number 54 of series 8. Grossly stable 4 mm nodule is noted in the adjacent region with stable associated ground-glass opacity. Right lung is clear. No new abnormality is noted. Musculoskeletal: Stable sclerotic densities are noted at T3 and T11 as well as T10 consistent with metastatic disease. Review of the MIP images confirms the above findings. CT ABDOMEN and PELVIS FINDINGS Hepatobiliary: No focal liver abnormality is seen. Status post cholecystectomy. No biliary dilatation. Pancreas: Unremarkable. No pancreatic ductal dilatation or surrounding inflammatory changes. Spleen: Normal in size without focal abnormality. Adrenals/Urinary Tract: Adrenal glands are unremarkable. Kidneys are normal, without renal calculi, focal lesion, or hydronephrosis. Bladder is unremarkable. Stomach/Bowel: Stomach is within normal limits. Appendix appears normal. No evidence of bowel wall thickening, distention, or inflammatory changes. Vascular/Lymphatic: No significant vascular findings are present. No enlarged abdominal or pelvic lymph nodes. Reproductive: Prostate is unremarkable. Other: No abdominal wall hernia or abnormality. No abdominopelvic ascites. Musculoskeletal: Stable sclerotic density seen and L5 consistent with metastatic disease. Severe degenerative disc disease is noted at L2-3. Stable right iliac sclerotic density is noted consistent with metastatic disease. Review of the MIP images confirms the above findings. IMPRESSION: No definite evidence of pulmonary embolus. However, there is severe narrowing of the lower lobe branch of the left pulmonary artery and its more peripheral vessels secondary to external compression from significantly increased left hilar adenopathy, due to metastatic disease. Subcarinal and aortopulmonary window adenopathy is also noted consistent with metastatic disease which has progressed since prior exam. Stable subpleural nodularity and ground-glass opacity seen  laterally in left upper lobe. Continued CT follow-up is recommended to ensure stability. Stable sclerotic lesions are noted in the thoracic and lumbar spine as well as the pelvis consistent with osseous metastases. Electronically Signed   By: Marijo Conception, M.D.   On: 06/04/2018 12:37    Procedures Procedures (including critical care time)  Medications Ordered in ED Medications  HYDROmorphone (DILAUDID) injection 1 mg (1 mg Intravenous Given 06/04/18 0956)  sodium chloride 0.9 % bolus 500 mL (0 mLs Intravenous Stopped 06/04/18 1117)  iopamidol (ISOVUE-370) 76 % injection 100 mL (100 mLs  Intravenous Contrast Given 06/04/18 1202)     Initial Impression / Assessment and Plan / ED Course  I have reviewed the triage vital signs and the nursing notes.  Pertinent labs & imaging results that were available during my care of the patient were reviewed by me and considered in my medical decision making (see chart for details).    Patient currently being treated for cancer with bone involvement presents with worsening pain.  Patient does have CT scans scheduled coming up however with worsening symptoms including abdominal pain and shortness of breath plan for CT angiogram to look for blood clot in the lungs and CT abdomen pelvis for more details of bony involvement and to look for any compression fractures. No neuro deficits at this time. Pain medicines and labs.  Pain improved in the ER.  CT scan results reviewed  No PE, showing mild worsening of cancer no other acute findings.  Patient stable to follow-up with oncologist.  Results and differential diagnosis were discussed with the patient/parent/guardian. Xrays were independently reviewed by myself.  Close follow up outpatient was discussed, comfortable with the plan.   Medications  HYDROmorphone (DILAUDID) injection 1 mg (1 mg Intravenous Given 06/04/18 0956)  sodium chloride 0.9 % bolus 500 mL (0 mLs Intravenous Stopped 06/04/18 1117)   iopamidol (ISOVUE-370) 76 % injection 100 mL (100 mLs Intravenous Contrast Given 06/04/18 1202)    Vitals:   06/04/18 1030 06/04/18 1100 06/04/18 1130 06/04/18 1200  BP: 138/74 120/76 (!) 142/85 (!) 149/81  Pulse: 84 81 87 79  Resp: (!) 22 (!) 22 (!) 22 20  Temp:      TempSrc:      SpO2: 94% 93% 96% 96%  Weight:      Height:        Final diagnoses:  Bone metastasis ([facility])  Acute midline low back pain, with sciatica presence unspecified     Final Clinical Impressions(s) / ED Diagnoses   Final diagnoses:  Bone metastasis ([facility])  Acute midline low back pain, with sciatica presence unspecified    ED Discharge Orders    None       Elnora Morrison, MD 06/04/18 1350

## 2018-06-05 ENCOUNTER — Ambulatory Visit (HOSPITAL_COMMUNITY): Payer: Medicare HMO

## 2018-06-05 ENCOUNTER — Other Ambulatory Visit (HOSPITAL_COMMUNITY): Payer: Medicare HMO

## 2018-06-07 ENCOUNTER — Other Ambulatory Visit (HOSPITAL_COMMUNITY): Payer: Medicare HMO

## 2018-06-07 ENCOUNTER — Ambulatory Visit (HOSPITAL_COMMUNITY): Payer: Medicare HMO

## 2018-06-07 ENCOUNTER — Other Ambulatory Visit (HOSPITAL_COMMUNITY): Payer: Self-pay | Admitting: Nurse Practitioner

## 2018-06-07 DIAGNOSIS — G4700 Insomnia, unspecified: Secondary | ICD-10-CM

## 2018-06-07 NOTE — Progress Notes (Signed)
[name] [name],  [id number]   CLINIC:  Medical Oncology/Hematology  PCP:  Redmond School, MD [address] 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 57 y.o. male returns for routine follow-up for small cell lung cancer. Patient is here today with his wife. He had a recent visit to the ER. He is having pain in his back and chest area. He reports it feels like he has to force air into his lungs. Reports SOB all the time. He feels like his pain is increasing ever since his last treatment. He is very uncomfortable. He denies any nausea, vomiting, or diarrhea. Denies any skin rash. Denies any headaches or vision changes. His appetite is 100% and he is maintaining his weight at this time. His energy level is 25%.     REVIEW OF SYSTEMS:  Review of Systems  Constitutional: Positive for fatigue.  Respiratory: Positive for shortness of breath.   Gastrointestinal: Positive  for nausea.  Neurological: Positive for dizziness, extremity weakness and numbness.  Hematological: Bruises/bleeds easily.  All other systems reviewed and are negative.    PAST MEDICAL/SURGICAL HISTORY:  Past Medical History:  Diagnosis Date  . Anxiety   . Asthma   . COPD (chronic obstructive pulmonary disease) ([place])   . DDD (degenerative disc disease)   . Diverticulitis   . GERD (gastroesophageal reflux disease)   . Headache   . Hepatitis    in the late 70's   . HTN (hypertension)   . Mediastinal adenopathy   . Seizures ([place])    as of 2019 none for 20 years  . Sleep apnea    does not  use cpap  . Stroke [facility])    TIA - Feb. 2018   Past Surgical History:  Procedure Laterality Date  . CERVICAL DISC SURGERY     X4  . CHOLECYSTECTOMY    . COLONOSCOPY  08/24/2011   Procedure: COLONOSCOPY;  Surgeon: Daneil Dolin, MD;  Location: [facility] SUITE;  Service: Endoscopy;  Laterality: N/A;  7:30AM  . ELBOW SURGERY     right  . PORTACATH PLACEMENT Right 12/01/2017   Procedure: INSERTION PORT-A-CATH;  Surgeon: Aviva Signs, MD;  Location: [facility] ORS;  Service: General;  Laterality: Right;  Marland Kitchen VIDEO BRONCHOSCOPY WITH ENDOBRONCHIAL ULTRASOUND N/A 10/31/2017   Procedure: VIDEO BRONCHOSCOPY WITH ENDOBRONCHIAL ULTRASOUND;  Surgeon: Ivin Poot, MD;  Location: [facility];  Service: Thoracic;  Laterality: N/A;  . VIDEO BRONCHOSCOPY WITH ENDOBRONCHIAL ULTRASOUND N/A 11/22/2017  Procedure: VIDEO BRONCHOSCOPY WITH ENDOBRONCHIAL ULTRASOUND;  Surgeon: Melrose Nakayama, MD;  Location: [facility] OR;  Service: Thoracic;  Laterality: N/A;     SOCIAL HISTORY:  Social History   Socioeconomic History  . Marital status: Married    Spouse name: Not on file  . Number of children: Not on file  . Years of education: Not on file  . Highest education level: Not on file  Occupational History  . Occupation: disability  Social Needs  . Financial resource strain: Not on file  . Food insecurity:    Worry: Not  on file    Inability: Not on file  . Transportation needs:    Medical: Not on file    Non-medical: Not on file  Tobacco Use  . Smoking status: Former Smoker    Packs/day: 1.50    Years: 30.00    Pack years: 45.00    Types: Cigarettes    Last attempt to quit: 10/13/2017    Years since quitting: 0.6  . Smokeless tobacco: Never Used  Substance and Sexual Activity  . Alcohol use: No  . Drug use: No  . Sexual activity: Yes    Birth control/protection: None  Lifestyle  . Physical activity:    Days per week: Not on file    Minutes per session: Not on file  . Stress: Not on file  Relationships  . Social connections:    Talks on phone: Not on file    Gets together: Not on file    Attends religious service: Not on file    Active member of club or organization: Not on file    Attends meetings of clubs or organizations: Not on file    Relationship status: Not on file  . Intimate partner violence:    Fear of current or ex partner: Not on file    Emotionally abused: Not on file    Physically abused: Not on file    Forced sexual activity: Not on file  Other Topics Concern  . Not on file  Social History Narrative  . Not on file    FAMILY HISTORY:  Family History  Problem Relation Age of Onset  . Hypertension Father   . Heart attack Father   . Kidney failure Mother   . Diabetes Mother   . Hypertension Mother   . Colon cancer Neg Hx   . Anesthesia problems Neg Hx   . Hypotension Neg Hx   . Malignant hyperthermia Neg Hx   . Pseudochol deficiency Neg Hx     CURRENT MEDICATIONS:  Outpatient Encounter Medications as of 06/08/2018  Medication Sig  . albuterol (PROVENTIL HFA;VENTOLIN HFA) 108 (90 BASE) MCG/ACT inhaler Inhale 2 puffs into the lungs every 6 (six) hours as needed for wheezing or shortness of breath.   Marland Kitchen albuterol (PROVENTIL) (2.5 MG/3ML) 0.083% nebulizer solution Take 2.5 mg by nebulization every 6 (six) hours as needed for wheezing or shortness of breath.   .  alprazolam (XANAX) 2 MG tablet Take 2 mg by mouth every 6 (six) hours as needed for sleep or anxiety.   Marland Kitchen amLODipine (NORVASC) 10 MG tablet Take 10 mg by mouth daily.  Huey Bienenstock (TECENTRIQ IV) Inject into the vein. Every 21 days  . CALCIUM-MAGNESIUM-ZINC PO Take 1,200 mg by mouth daily.  . COLLAGEN PO Take 7 g by mouth daily.  . cyclobenzaprine (FLEXERIL) 10 MG tablet Take 10 mg by mouth daily as needed for muscle spasms.   . diclofenac sodium (VOLTAREN) 1 %  GEL Apply 2 g topically 4 (four) times daily. Apply to left forearm phlebitic area  . docusate sodium (COLACE) 100 MG capsule Take 1 capsule (100 mg total) by mouth every 12 (twelve) hours.  . Flaxseed, Linseed, (FLAXSEED OIL PO) Take 1 tablet by mouth 2 (two) times daily.   . fluticasone (FLONASE) 50 MCG/ACT nasal spray Place 2 sprays into both nostrils daily as needed for allergies.   . furosemide (LASIX) 20 MG tablet Take 10 mg by mouth daily.   . Glucosamine-Chondroit-Vit C-Mn (GLUCOSAMINE 1500 COMPLEX) CAPS Take 1 capsule by mouth.  . levETIRAcetam (KEPPRA) 500 MG tablet Take 500 mg by mouth daily.   Marland Kitchen lidocaine (XYLOCAINE) 2 % solution Use as directed 15 mLs in the mouth or throat every 4 (four) hours as needed for mouth pain.   Marland Kitchen lidocaine-prilocaine (EMLA) cream APPLY TO PORTACATH SITE AS DIRECTED  . lisinopril (PRINIVIL,ZESTRIL) 20 MG tablet Take 20 mg by mouth 2 (two) times daily.  . meclizine (ANTIVERT) 12.5 MG tablet Take 1 tablet (12.5 mg total) by mouth 3 (three) times daily as needed for dizziness.  . mirtazapine (REMERON) 7.5 MG tablet Take 1 tablet (7.5 mg total) by mouth at bedtime.  . MULTIPLE VITAMINS PO Take 1 tablet by mouth daily.   . mupirocin ointment (BACTROBAN) 2 % APPLY TO SKIN TID PRN  . naproxen (NAPROSYN) 500 MG tablet Take 500 mg by mouth 2 (two) times daily with a meal.    . oxyCODONE (ROXICODONE) 15 MG immediate release tablet Take 15 mg by mouth 4 (four) times daily as needed for pain.   .  pantoprazole (PROTONIX) 40 MG tablet Take 40 mg by mouth at bedtime.   . polyethylene glycol (MIRALAX / GLYCOLAX) packet Take 17 g by mouth daily.  . pravastatin (PRAVACHOL) 20 MG tablet Take 20 mg by mouth daily.   . predniSONE (DELTASONE) 10 MG tablet Take 1 tablet (10 mg total) by mouth daily with breakfast.  . tamsulosin (FLOMAX) 0.4 MG CAPS capsule Take 0.4 mg by mouth daily.   . temazepam (RESTORIL) 30 MG capsule TAKE 1 CAPSULE AT BEDTIME AS NEEDED FOR SLEEP  . triamcinolone cream (KENALOG) 0.1 % Apply 1 application topically 2 (two) times daily.   . TURMERIC PO Take 4 tablets by mouth daily.   . [DISCONTINUED] amoxicillin-clavulanate (AUGMENTIN) 875-125 MG tablet Take 1 tablet by mouth every 12 (twelve) hours. (Patient not taking: Reported on 06/04/2018)  . [DISCONTINUED] prochlorperazine (COMPAZINE) 10 MG tablet Take 1 tablet (10 mg total) by mouth every 6 (six) hours as needed (Nausea or vomiting).  . [DISCONTINUED] temazepam (RESTORIL) 30 MG capsule TAKE 1 CAPSULE AT BEDTIME AS NEEDED FOR SLEEP   No facility-administered encounter medications on file as of 06/08/2018.     ALLERGIES:  Allergies  Allergen Reactions  . Xgeva [Denosumab]     Rash   . Tecentriq [Atezolizumab] Rash     PHYSICAL EXAM:  ECOG Performance status: 1  Vitals:   06/08/18 0947  BP: (!) 158/70  Pulse: 78  Resp: 20  Temp: 97.8 F (36.6 C)  SpO2: 100%   Filed Weights   06/08/18 0947  Weight: 198 lb 1.6 oz (89.9 kg)    Physical Exam  Constitutional: He is oriented to person, place, and time. He appears well-developed and well-nourished.  Cardiovascular: Normal rate, regular rhythm and normal heart sounds.  Pulmonary/Chest: Effort normal and breath sounds normal.  Musculoskeletal: Normal range of motion.  Neurological: He is alert and oriented to  person, place, and time.  Skin: Skin is warm and dry.  Psychiatric: He has a normal mood and affect. His behavior is normal. Judgment and thought  content normal.     LABORATORY DATA:  I have reviewed the labs as listed.  CBC    Component Value Date/Time   WBC 11.4 (H) 06/04/2018 0941   RBC 4.49 06/04/2018 0941   HGB 14.1 06/04/2018 0941   HCT 41.6 06/04/2018 0941   PLT 197 06/04/2018 0941   MCV 92.7 06/04/2018 0941   MCH 31.4 06/04/2018 0941   MCHC 33.9 06/04/2018 0941   RDW 13.5 06/04/2018 0941   LYMPHSABS 0.8 06/04/2018 0941   MONOABS 0.5 06/04/2018 0941   EOSABS 0.1 06/04/2018 0941   BASOSABS 0.0 06/04/2018 0941   CMP Latest Ref Rng & Units 06/04/2018 05/25/2018 05/18/2018  Glucose 70 - 99 mg/dL 133(H) 171(H) 112(H)  BUN 6 - 20 mg/dL 18 17 23(H)  Creatinine 0.61 - 1.24 mg/dL 1.08 1.27(H) 1.13  Sodium 135 - 145 mmol/L 138 138 140  Potassium 3.5 - 5.1 mmol/L 4.3 3.5 3.6  Chloride 98 - 111 mmol/L 102 103 104  CO2 22 - 32 mmol/L 25 24 26   Calcium 8.9 - 10.3 mg/dL 9.5 9.4 9.4  Total Protein 6.5 - 8.1 g/dL 7.3 7.3 7.0  Total Bilirubin 0.3 - 1.2 mg/dL 0.7 0.7 0.7  Alkaline Phos 38 - 126 U/L 65 61 55  AST 15 - 41 U/L 22 22 19   ALT 0 - 44 U/L 17 21 22        DIAGNOSTIC IMAGING:  Independently reviewed CT scan of the chest, abdomen and pelvis images and discussed with patient.     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  Extensive stage small cell lung cancer: -4 cycles of carboplatin, etoposide and Atezolizumab from 11/27/2017 through 01/29/2018 -CT scan of the chest, abdomen after 3 cycles of chemo immunotherapy on 01/25/2018 shows near complete resolution of the adenopathy and lung mass.  Sclerosis and bone mets noted.  - Maintenance Atezolizumab started on 02/28/2018, second dose on 03/22/2018, each time resulting in severe urticarial rash on the upper trunk and lower extremities, improved on steroid therapy. -He has gotten severe urticarial rash from Atezolizumab.  I have discussed the results of the CT scan of the chest, abdomen and pelvis dated 04/06/2018 which showed no  evidence of disease progression.  Previously measured mediastinal lymph nodes are barely detectable.  Dominant left hilar lymph node measures 2.1 x 0.9 cm.  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  Mild T11 compression fracture with less than 10% height loss was also seen. - I have sent him for a second opinion with Dr. Elmyra Ricks at [facility]. -He was evaluated by Dr. Berta Minor on 04/19/2018.  He was also referred to [facility] dermatology Dr. Royanne Foots. -It was suggested to give another trial of Atezolizumab.  He discontinued dexamethasone 4 mg daily and started taking prednisone 10 mg daily.  He was also prescribed triamcinolone 0.1% cream to be used twice daily. - Last Atezolizumab was on 05/18/2018. - He developed breathing difficulty with shortness of breath on exertion and on and off chest pain for the last 3 weeks.  He reportedly went to the ER on 06/04/2018 with a did a CT scan of the chest PE protocol and CT of the abdomen and pelvis. - I have reviewed the images of the CT scan independently and discussed the results  with the patient.  It showed progression of the left hilar adenopathy compared to the prior exam from 2 months ago.  This currently measures about 2.3 x 4 cm in size.  Subcarinal lymph node also increased in size compared to prior exam.  No new areas were seen. - At this time I have recommended discontinuing Atezolizumab.  He had a relapse within 6 months after finishing primary therapy. -I have recommended Topotecan at 1.5 mg/m/day for 5 days every 21 days.  There is no data if the combination immunotherapy with nivolumab and ipilimumab will work after disease progression on Atezolizumab.  Docetaxel is also 1 of the options. -I will make a referral to Dr. Elmyra Ricks at [facility] to see if they have any clinical trials.  We will see him back after the consultation.  2. Bone metastasis: - Receiving Zometa every 4 weeks.  His last  Zometa was on 05/25/2018.  3.  Skin rash: - He had severe skin rash (lichenoid dermatitis) on the trunk and upper thighs after each dose of Atezolizumab.  This responded very well to steroids. -She was evaluated by Dr. Koleen Distance [facility].  She prescribed him triamcinolone 0.1% cream to be applied twice daily. -She recommended to give another trial of Atezolizumab.  He will continue prednisone 10 mg daily.  He will continue to use the triamcinolone cream.  She would like to see him 4 to 5 days after Atezolizumab dose.  Hence we have made a follow-up visit with her on 05/03/2018.  He gets diffuse rash typically 7 days after each dose of Atezolizumab. - She would consider starting him on [place].  Cyclosporine at a dose of 1.5 mg to 2.5 mg per kilogram twice a day is also an option. -He did not get any rash after his last doses on 04/27/2018 and 05/18/2018.      Orders placed this encounter:  Orders Placed This Encounter  Procedures  . Lactate dehydrogenase  . TSH  . CBC with Differential/Platelet  . Comprehensive metabolic panel      Derek Jack, MD [place] [phone]

## 2018-06-08 ENCOUNTER — Other Ambulatory Visit: Payer: Self-pay

## 2018-06-08 ENCOUNTER — Inpatient Hospital Stay (HOSPITAL_BASED_OUTPATIENT_CLINIC_OR_DEPARTMENT_OTHER): Payer: Medicare HMO | Admitting: Hematology

## 2018-06-08 ENCOUNTER — Encounter (HOSPITAL_COMMUNITY): Payer: Self-pay | Admitting: Hematology

## 2018-06-08 ENCOUNTER — Inpatient Hospital Stay (HOSPITAL_COMMUNITY): Payer: Medicare HMO

## 2018-06-08 VITALS — BP 158/70 | HR 78 | Temp 97.8°F | Resp 20 | Wt 198.1 lb

## 2018-06-08 DIAGNOSIS — Z9221 Personal history of antineoplastic chemotherapy: Secondary | ICD-10-CM

## 2018-06-08 DIAGNOSIS — Z7952 Long term (current) use of systemic steroids: Secondary | ICD-10-CM | POA: Diagnosis not present

## 2018-06-08 DIAGNOSIS — Z87891 Personal history of nicotine dependence: Secondary | ICD-10-CM | POA: Diagnosis not present

## 2018-06-08 DIAGNOSIS — C7951 Secondary malignant neoplasm of bone: Secondary | ICD-10-CM | POA: Diagnosis not present

## 2018-06-08 DIAGNOSIS — J449 Chronic obstructive pulmonary disease, unspecified: Secondary | ICD-10-CM | POA: Diagnosis not present

## 2018-06-08 DIAGNOSIS — L309 Dermatitis, unspecified: Secondary | ICD-10-CM

## 2018-06-08 DIAGNOSIS — Z5112 Encounter for antineoplastic immunotherapy: Secondary | ICD-10-CM | POA: Diagnosis not present

## 2018-06-08 DIAGNOSIS — Z79899 Other long term (current) drug therapy: Secondary | ICD-10-CM | POA: Diagnosis not present

## 2018-06-08 DIAGNOSIS — C3492 Malignant neoplasm of unspecified part of left bronchus or lung: Secondary | ICD-10-CM | POA: Diagnosis not present

## 2018-06-08 DIAGNOSIS — I10 Essential (primary) hypertension: Secondary | ICD-10-CM | POA: Diagnosis not present

## 2018-06-08 DIAGNOSIS — C3490 Malignant neoplasm of unspecified part of unspecified bronchus or lung: Secondary | ICD-10-CM

## 2018-06-08 NOTE — Progress Notes (Signed)
No treatment today per MD.  

## 2018-06-08 NOTE — Assessment & Plan Note (Signed)
1.  Extensive stage small cell lung cancer: -4 cycles of carboplatin, etoposide and Atezolizumab from 11/27/2017 through 01/29/2018 -CT scan of the chest, abdomen after 3 cycles of chemo immunotherapy on 01/25/2018 shows near complete resolution of the adenopathy and lung mass.  Sclerosis and bone mets noted.  - Maintenance Atezolizumab started on 02/28/2018, second dose on 03/22/2018, each time resulting in severe urticarial rash on the upper trunk and lower extremities, improved on steroid therapy. -He has gotten severe urticarial rash from Atezolizumab.  I have discussed the results of the CT scan of the chest, abdomen and pelvis dated 04/06/2018 which showed no evidence of disease progression.  Previously measured mediastinal lymph nodes are barely detectable.  Dominant left hilar lymph node measures 2.1 x 0.9 cm.  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  Mild T11 compression fracture with less than 10% height loss was also seen. - I have sent him for a second opinion with Dr. Elmyra Ricks at [facility]. -He was evaluated by Dr. Berta Minor on 04/19/2018.  He was also referred to [facility] dermatology Dr. Royanne Foots. -It was suggested to give another trial of Atezolizumab.  He discontinued dexamethasone 4 mg daily and started taking prednisone 10 mg daily.  He was also prescribed triamcinolone 0.1% cream to be used twice daily. - Last Atezolizumab was on 05/18/2018. - He developed breathing difficulty with shortness of breath on exertion and on and off chest pain for the last 3 weeks.  He reportedly went to the ER on 06/04/2018 with a did a CT scan of the chest PE protocol and CT of the abdomen and pelvis. - I have reviewed the images of the CT scan independently and discussed the results with the patient.  It showed progression of the left hilar adenopathy compared to the prior exam from 2 months ago.  This currently measures about 2.3 x 4 cm in size.  Subcarinal lymph node also  increased in size compared to prior exam.  No new areas were seen. - At this time I have recommended discontinuing Atezolizumab.  He had a relapse within 6 months after finishing primary therapy. -I have recommended Topotecan at 1.5 mg/m/day for 5 days every 21 days.  There is no data if the combination immunotherapy with nivolumab and ipilimumab will work after disease progression on Atezolizumab.  Docetaxel is also 1 of the options. -I will make a referral to Dr. Elmyra Ricks at [facility] to see if they have any clinical trials.  We will see him back after the consultation.  2. Bone metastasis: - Receiving Zometa every 4 weeks.  His last Zometa was on 05/25/2018.  3.  Skin rash: - He had severe skin rash (lichenoid dermatitis) on the trunk and upper thighs after each dose of Atezolizumab.  This responded very well to steroids. -She was evaluated by Dr. Koleen Distance [facility].  She prescribed him triamcinolone 0.1% cream to be applied twice daily. -She recommended to give another trial of Atezolizumab.  He will continue prednisone 10 mg daily.  He will continue to use the triamcinolone cream.  She would like to see him 4 to 5 days after Atezolizumab dose.  Hence we have made a follow-up visit with her on 05/03/2018.  He gets diffuse rash typically 7 days after each dose of Atezolizumab. - She would consider starting him on [place].  Cyclosporine at a dose of 1.5 mg to 2.5 mg per kilogram twice a day is also an  option. -He did not get any rash after his last doses on 04/27/2018 and 05/18/2018.

## 2018-06-08 NOTE — Patient Instructions (Signed)
[facility] Discharge Instructions     Thank you for choosing [facility] at [facility] to provide your oncology and hematology care.  To afford each patient quality time with our provider, please arrive at least 15 minutes before your scheduled appointment time.   If you have a lab appointment with the Cancer Center please come in thru the  Main Entrance and check in at the main information desk  You need to re-schedule your appointment should you arrive 10 or more minutes late.  We strive to give you quality time with our providers, and arriving late affects you and other patients whose appointments are after yours.  Also, if you no show three or more times for appointments you may be dismissed from the clinic at the providers discretion.     Again, thank you for choosing [facility].  Our hope is that these requests will decrease the amount of time that you wait before being seen by our physicians.       _____________________________________________________________  Should you have questions after your visit to [facility], please contact our office at [phone] between the hours of 8:00 a.m. and 4:30 p.m.  Voicemails left after 4:00 p.m. will not be returned until the following business day.  For prescription refill requests, have your pharmacy contact our office and allow 72 hours.    Cancer Center Support Programs:   > Cancer Support Group  2nd Tuesday of the month 1pm-2pm, Journey Room    

## 2018-06-12 DIAGNOSIS — G4733 Obstructive sleep apnea (adult) (pediatric): Secondary | ICD-10-CM | POA: Diagnosis not present

## 2018-06-12 DIAGNOSIS — J449 Chronic obstructive pulmonary disease, unspecified: Secondary | ICD-10-CM | POA: Diagnosis not present

## 2018-06-12 DIAGNOSIS — C3490 Malignant neoplasm of unspecified part of unspecified bronchus or lung: Secondary | ICD-10-CM | POA: Diagnosis not present

## 2018-06-12 DIAGNOSIS — J9611 Chronic respiratory failure with hypoxia: Secondary | ICD-10-CM | POA: Diagnosis not present

## 2018-06-13 DIAGNOSIS — J449 Chronic obstructive pulmonary disease, unspecified: Secondary | ICD-10-CM | POA: Diagnosis not present

## 2018-06-15 ENCOUNTER — Other Ambulatory Visit (HOSPITAL_COMMUNITY): Payer: Self-pay | Admitting: Hematology

## 2018-06-15 ENCOUNTER — Inpatient Hospital Stay (HOSPITAL_COMMUNITY): Payer: Medicare HMO | Attending: Hematology

## 2018-06-15 ENCOUNTER — Encounter: Payer: Self-pay | Admitting: Radiation Oncology

## 2018-06-15 DIAGNOSIS — K219 Gastro-esophageal reflux disease without esophagitis: Secondary | ICD-10-CM | POA: Insufficient documentation

## 2018-06-15 DIAGNOSIS — Z79899 Other long term (current) drug therapy: Secondary | ICD-10-CM | POA: Insufficient documentation

## 2018-06-15 DIAGNOSIS — Z5112 Encounter for antineoplastic immunotherapy: Secondary | ICD-10-CM | POA: Insufficient documentation

## 2018-06-15 DIAGNOSIS — C3492 Malignant neoplasm of unspecified part of left bronchus or lung: Secondary | ICD-10-CM | POA: Insufficient documentation

## 2018-06-15 DIAGNOSIS — Z9221 Personal history of antineoplastic chemotherapy: Secondary | ICD-10-CM | POA: Insufficient documentation

## 2018-06-15 DIAGNOSIS — Z9981 Dependence on supplemental oxygen: Secondary | ICD-10-CM | POA: Insufficient documentation

## 2018-06-15 DIAGNOSIS — J449 Chronic obstructive pulmonary disease, unspecified: Secondary | ICD-10-CM | POA: Insufficient documentation

## 2018-06-15 DIAGNOSIS — F419 Anxiety disorder, unspecified: Secondary | ICD-10-CM | POA: Insufficient documentation

## 2018-06-15 DIAGNOSIS — C3490 Malignant neoplasm of unspecified part of unspecified bronchus or lung: Secondary | ICD-10-CM | POA: Diagnosis not present

## 2018-06-15 DIAGNOSIS — Z8673 Personal history of transient ischemic attack (TIA), and cerebral infarction without residual deficits: Secondary | ICD-10-CM | POA: Insufficient documentation

## 2018-06-15 DIAGNOSIS — L309 Dermatitis, unspecified: Secondary | ICD-10-CM | POA: Insufficient documentation

## 2018-06-15 DIAGNOSIS — G4730 Sleep apnea, unspecified: Secondary | ICD-10-CM | POA: Insufficient documentation

## 2018-06-15 DIAGNOSIS — I10 Essential (primary) hypertension: Secondary | ICD-10-CM | POA: Insufficient documentation

## 2018-06-15 DIAGNOSIS — Z87891 Personal history of nicotine dependence: Secondary | ICD-10-CM | POA: Insufficient documentation

## 2018-06-15 DIAGNOSIS — Z7189 Other specified counseling: Secondary | ICD-10-CM

## 2018-06-15 DIAGNOSIS — C7951 Secondary malignant neoplasm of bone: Secondary | ICD-10-CM | POA: Insufficient documentation

## 2018-06-15 DIAGNOSIS — Z7952 Long term (current) use of systemic steroids: Secondary | ICD-10-CM | POA: Insufficient documentation

## 2018-06-15 DIAGNOSIS — Z23 Encounter for immunization: Secondary | ICD-10-CM | POA: Insufficient documentation

## 2018-06-15 MED ORDER — PROCHLORPERAZINE MALEATE 10 MG PO TABS: 10.0000 mg | ORAL_TABLET | Freq: Four times a day (QID) | ORAL | 3 refills | Status: DC | PRN

## 2018-06-15 NOTE — Progress Notes (Signed)
DISCONTINUE ON PATHWAY REGIMEN - Small Cell Lung     Cycles 1 through 4, every 21 days:     Atezolizumab      Carboplatin      Etoposide    Cycles 5 and beyond, every 21 days:     Atezolizumab   **Always confirm dose/schedule in your pharmacy ordering system**  REASON: Disease Progression PRIOR TREATMENT: [id number]: Atezolizumab 1200 mg D1 + Carboplatin AUC=5 D1 + Etoposide 100 mg/m2 D1-3 q21 Days x 4 Cycles, Followed by Atezolizumab 1200 mg Maintenance Until Progression or Unacceptable Toxicity TREATMENT RESPONSE: Progressive Disease (PD)  START OFF PATHWAY REGIMEN - Small Cell Lung   [id number]:Ipilimumab 1 mg/kg + Nivolumab 3 mg/kg q21 Days x 4 Cycles (Induction):   A cycle is every 21 days:     Nivolumab      Ipilimumab   **Always confirm dose/schedule in your pharmacy ordering system**  Patient Characteristics: Relapsed or Progressive Disease, Second Line, Relapse 3 - 6 Months Therapeutic Status: Relapsed or Progressive Disease Line of Therapy: Second Line Time to Relapse: Relapse 3 - 6 Months Intent of Therapy: Non-Curative / Palliative Intent, Discussed with Patient

## 2018-06-15 NOTE — Patient Instructions (Signed)
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 We are going to be treating you with palliative intent, which means that your cancer is treatable but not curable.  We are going to be treating you every 21 days which is 1 cycle.  You will be getting Nivolumab (Opdivo) and Ipilimumab (Yervoy) every 21 days x 4 cycles and then you will be getting Nivolumab (Opdivo) every 14 days.  These medications will be going through your port a cath. You will see the doctor regularly throughout treatment.  We monitor your lab work prior to every treatment. The doctor monitors your response to treatment by the way you are feeling, your blood work, and scans periodically.  There will be wait times while you are here for treatment.  It will take about 30 minutes to 1 hour for your lab work to result.  Then there will be wait times while pharmacy mixes your medications.    You will get the following premedications prior to treatment: Benadryl and Pepcid - to help reduce the risk of an allergic type reaction to the medications  Nivolumab (Opdivo)  About This Drug Nivolumab is used to treat cancer. It is given in the vein (IV).  This drug takes 30 minutes to infuse.  Possible Side Effects . Bone marrow depression. This is a decrease in the number of white blood cells, red blood cells, and platelets. This may raise your risk of infection, make you tired and weak (fatigue), and raise your risk of bleeding. . Tiredness and weakness . Joint, muscle and bone pain . Back pain . Loose bowel movements (diarrhea) . Nausea . Decreased appetite (decreased hunger) . Constipation (not able to move bowels) . Cough and trouble breathing . Upper respiratory infection . Fever . Rash and itching . Electrolyte changes Note: Each of the side effects above was reported in 20% or greater of patients treated with nivolumab. Not all possible side effects are  included above. Your side effects may be different or more severe if you receive nivolumab in combination with other chemotherapy agents.  Warnings and Precautions . This drug works with your immune system and can cause inflammation in any of your organs and tissues and can change how they work. This may put you at risk for developing serious medical problems which can very rarely be fatal. . Colitis (swelling (inflammation) in the colon) - symptoms are loose bowel movements (diarrhea) stomach cramping, and sometimes blood in the bowel movements . Changes in liver function . Changes in kidney function . Inflammation (swelling) of the lungs which can very rarely be fatal - you may have a dry cough or trouble breathing. . This drug may affect some of your hormone glands (especially the thyroid, adrenals, pituitary and pancreas). . Blood sugar levels may change and you may develop diabetes. If you already have diabetes, changes may need to be made to your diabetes medication. . Severe allergic skin reaction which can very rarely be fatal. You may develop blisters on your skin that are filled with fluid or a severe red rash all over your body that may be painful. . Changes in your central nervous system can happen. The central nervous system is made up of your brain and spinal cord. You could feel extreme tiredness, agitation, confusion, hallucinations (see or hear things that are not there), trouble understanding or speaking, loss of control of your bowels or bladder, eyesight changes, numbness or lack of  strength to your arms, legs, face, or body, and coma. If you start to have any of these symptoms let your doctor know right away. . While you are getting this drug in your vein (IV), you may have a reaction to the drug. Sometimes you may be given medication to stop or lessen these side effects. Your nurse will check you closely for these signs: fever or shaking chills, flushing, facial swelling, feeling  dizzy, headache, trouble breathing, rash, itching, chest tightness, or chest pain. These reactions may happen after your infusion. If this happens, call 911 for emergency care. . Increased risk of complications, which may very rarely be fatal, in patients who will undergo a stem cell transplant after receiving nivolumab.  Important Information . This drug may be present in the saliva, tears, sweat, urine, stool, vomit, semen, and vaginal secretions. Talk to your doctor and/or your nurse about the necessary precautions to take during this time.  Treating Side Effects . To decrease infection, wash your hands regularly . Avoid close contact with people who have a cold, the flu, or other infections. . Take your temperature as your doctor or nurse tells you, and whenever you feel like you may have a fever . To help decrease bleeding, use a soft toothbrush. Check with your nurse before using dental floss. . Be very careful when using knives or tools . Use an electric shaver instead of a razor . Ask your doctor or nurse about medicines that are available to help stop or lessen constipation, diarrhea and/or nausea. . Drink plenty of fluids (a minimum of eight glasses per day is recommended). . If you are not able to move your bowels, check with your doctor or nurse before you use any enemas, laxatives, or suppositories . To help with nausea and vomiting, eat small, frequent meals instead of three large meals a day. Choose foods and drinks that are at room temperature. Ask your nurse or doctor about other helpful tips and medicine that is available to help or stop lessen these symptoms. . If you get diarrhea, eat low-fiber foods that are high in protein and calories and avoid foods that can irritate your digestive tracts or lead to cramping. Ask your nurse or doctor about medicine that can lessen or stop your diarrhea. . To help with decreased appetite, eat small, frequent meals . Eat high caloric food  such as pudding, ice cream, yogurt and milkshakes. . Manage tiredness by pacing your activities for the day. Be sure to include periods of rest between energy-draining activities . Keeping your pain under control is important to your wellbeing. Please tell your doctor or nurse if you are experiencing pain. . If you have diabetes, keep good control of your blood sugar level. Tell your nurse or your doctor if your glucose levels are higher or lower than normal . If you get a rash do not put anything on it unless your doctor or nurse says you may. Keep the area around the rash clean and dry. Ask your doctor for medicine if your rash bothers you. . Infusion reactions may occur after your infusion. If this happens, call 911 for emergency care.  Food and Drug Interactions . There are no known interactions of nivolumab with food or other medications. . Tell your doctor and pharmacist about all the medicines and dietary supplements (vitamins, minerals, herbs and others) that you are taking at this time. The safety and use of dietary supplements and alternative diets are often not known. Using  these might affect your cancer or interfere with your treatment. Until more is known, you should not use dietary supplements or alternative diets without your cancer doctor's help.  When to Call the Doctor Call your doctor or nurse if you have any of these symptoms and/or any new or unusual symptoms: . Fever of 100.5 F (38 C) or higher . Chills . Easy bleeding or bruising . Wheezing or trouble breathing . Dry cough, or cough with yellow, green or bloody mucus . Confusion and/or agitation . Hallucinations . Trouble understanding or speaking . Blurry vision or changes in your eyesight . Numbness or lack of strength to your arms, legs, face, or body . Feeling dizzy or lightheaded . Loose bowel movements (diarrhea) more than 4 times a day or diarrhea with weakness or lightheadedness . Nausea that stops you from  eating or drinking, and/or that is not relieved by prescribed medicines . Lasting loss of appetite or rapid weight loss of five pounds in a week . No bowel movement for 3 days or you feel uncomfortable . Bad abdominal pain, especially in upper right area . Fatigue or extreme weakness that interferes with normal activities . Decreased urine . Unusual thirst or passing urine often . Rash that is not relieved by prescribed medicines . Rash or itching . Flu-like symptoms: fever, headache, muscle and joint aches, and fatigue (low energy, feeling weak) . Signs of liver problems: dark urine, pale bowel movements, bad stomach pain, feeling very tired and weak, unusual itching, or yellowing of the eyes or skin. . Signs of infusion reaction: fever or shaking chills, flushing, facial swelling, feeling dizzy, headache, trouble breathing, rash, itching, chest tightness, or chest pain. . If you think you may be pregnant  Reproduction Warnings . Pregnancy warning: This drug can have harmful effects on the unborn baby. Women of child bearing potential should use effective methods of birth control during your cancer treatment and for at least 5 months after treatment. Let your doctor know right away if you think you may be pregnant. . Breastfeeding warning: It is not known if this drug passes into breast milk. For this reason, women should not breast feed during treatment because this drug could enter the breast milk and cause harm to a breast feeding baby. . Fertility warning: Human fertility studies have not been done with this drug. Talk with your doctor or nurse if you plan to have children. Ask for information on sperm or egg banking.   Ipilimumab Curt Bears)  About This Drug Ipilimumab is used to treat cancer. It is given in the vein (IV).  This drug will take 90 minutes to infuse.   Possible Side Effects . Tiredness . Loose bowel movements (diarrhea) . Colitis. This is swelling (inflammation) in the  colon - symptoms are loose bowel movements (diarrhea) stomach cramping, and sometimes blood in the bowel movements . Nausea and throwing up (vomiting) . Decreased appetite (decreased hunger) . Weight loss . Headache . Fever . Trouble sleeping . Rash and itching Note: Each of the side effects above was reported in 5% or greater of patients treated with ipilimumab. Not all possible side effects are included above.  Warnings and Precautions . This drug works with your immune system and can cause inflammation in any of your organs and tissues and can change how they work. This may put you at risk for developing serious medical problems which can very rarely be fatal. . Severe allergic skin reaction which can very rarely be fatal.  You may develop blisters on your skin that are filled with fluid or a severe red rash all over your body that may be painful. . Severe colitis which can very rarely be fatal. This is swelling (inflammation) in the colon. . Changes in your liver function which can very rarely cause liver failure and be fatal . This drug may affect some of your hormone glands (especially the thyroid, adrenals, pituitary and pancreas). . Inflammation of your nerves. You may feel numbness, tingling, or pain in your hands and feet. It may be hard for you to button your clothes, open jars, or walk as usual. The effect on the nerves may get worse with more doses of the drug. These effects get better in some people after the drug is stopped but it does not get better in all people. Very rarely, this can affect the nerves and muscles in your upper and lower body and cause paralysis. . Inflammation of your eye and/or other changes in eyesight  Important Information . This drug may be present in the saliva, tears, sweat, urine, stool, vomit, semen, and vaginal secretions. Talk to your doctor and/or your nurse about the necessary precautions to take during this time.  Treating Side Effects . Drink  plenty of fluids (a minimum of eight glasses per day is recommended) . To help with decreased appetite, eat small, frequent meals . Eat high caloric food such as pudding, ice cream, yogurt and milkshakes. . To help with weight loss, drink fluids that contribute calories (whole milk, juice, soft drinks, sweetened beverages, milkshakes, and nutritional supplements) instead of water. . Include a source of protein at every meal and snack, such as meat, poultry, fish, dry beans, tofu, eggs, nuts, milk, yogurt, cheese, ice cream, pudding, and nutritional supplements. . If you throw up or have loose bowel movements, you should drink more fluids so that you do not become dehydrated (lack water in the body from losing too much fluid). . To help with nausea and vomiting, eat small, frequent meals instead of three large meals a day. Choose foods and drinks that are at room temperature. Ask your nurse or doctor about other helpful tips and medicine that is available to help or stop lessen these symptoms. . If you get diarrhea, eat low-fiber foods that are high in protein and calories and avoid foods that can irritate your digestive tracts or lead to cramping. . Ask your nurse or doctor about medicine that can lessen or stop your diarrhea. Marland Kitchen Keeping your pain under control is important to your well-being. Please tell your doctor or nurse if you are experiencing pain. . If you get a rash do not put anything on it unless your doctor or nurse says you may. Keep the area around the rash clean and dry. Ask your doctor for medicine if your rash bothers you . If you are having trouble sleeping, talk to your nurse or doctor on tips to help you sleep better . If you have numbness and tingling in your hands and feet, be careful when cooking, walking, and handling sharp objects and hot liquids.  Food and Drug Interactions . There are no known interactions of ipilimumab with food or other medications. . Tell your doctor and  pharmacist about all the medicines and dietary supplements (vitamins, minerals, herbs and others) that you are taking at this time. The safety and use of dietary supplements and alternative diets are often not known. Using these might affect your cancer or interfere with your  treatment. Until more is known, you should not use dietary supplements or alternative diets without your cancer doctor's help.  When to Call the Doctor Call your doctor or nurse if you have any of these symptoms and/or any new or unusual symptoms: . Fever of 100.5 F (38 C) or higher . Chills . Flu-like symptoms: fever, headache, muscle and joint aches, and fatigue (low energy, feeling weak) . Lasting loss of appetite or rapid weight loss of five pounds in a week . Nausea that stops you from eating or drinking and/or is not relieved by prescribed medicines . Throwing up more than 3 times a day . Loose bowel movements (diarrhea) 4 times or loose bowel movements with lack of strength or a feeling of being dizzy . Pain in your abdomen that does not go away . Blood in your stool . Headache that does not go away . Blurred vision or other changes in eyesight . Signs of possible liver problems: dark urine, pale bowel movements, bad stomach pain, feeling very tired and weak, unusual itching, or yellowing of the eyes or skin . A new rash or a rash that is not relieved by prescribed medicines . Numbness, tingling, or pain your hands and feet . If you think you may be pregnant  Reproduction Warnings . Pregnancy warning: This drug can have harmful effects on the unborn baby. Women of child bearing potential should use effective methods of birth control during your cancer treatment and for at least 3 months after treatment. Let your doctor know right away if you think you may be pregnant . Breastfeeding warning: It is not known if this drug passes into breast milk. For this reason, women should not breast feed during treatment and  for 3 months after treatment because this drug could enter the breast milk and cause harm to a breast feeding baby. . Fertility warning: Human fertility studies have not been done with this drug. Talk with your doctor or nurse if you plan to have children. Ask for information on sperm or egg banking.  SELF CARE ACTIVITIES WHILE ON CHEMOTHERAPY:  Hydration Increase your fluid intake 48 hours prior to treatment and drink at least 8 to 12 cups (64 ounces) of water/decaffeinated beverages per day after treatment. You can still have your cup of coffee or soda but these beverages do not count as part of your 8 to 12 cups that you need to drink daily. No alcohol intake.  Medications Continue taking your normal prescription medication as prescribed.  If you start any new herbal or new supplements please let us know first to make sure it is safe.  Mouth Care Have teeth cleaned professionally before starting treatment. Keep dentures and partial plates clean. Use soft toothbrush and do not use mouthwashes that contain alcohol. Biotene is a good mouthwash that is available at most pharmacies or may be ordered by calling [phone]. Use warm salt water gargles (1 teaspoon salt per 1 quart warm water) before and after meals and at bedtime. Or you may rinse with 2 tablespoons of three-percent hydrogen peroxide mixed in eight ounces of water. If you are still having problems with your mouth or sores in your mouth please call the clinic. If you need dental work, please let the doctor know before you go for your appointment so that we can coordinate the best possible time for you in regards to your chemo regimen. You need to also let your dentist know that you are actively taking chemo. We  may need to do labs prior to your dental appointment.  Skin Care Always use sunscreen that has not expired and with SPF (Sun Protection Factor) of 50 or higher. Wear hats to protect your head from the sun. Remember to use  sunscreen on your hands, ears, face, & feet.  Use good moisturizing lotions such as udder cream, eucerin, or even Vaseline. Some chemotherapies can cause dry skin, color changes in your skin and nails.    . Avoid long, hot showers or baths. . Use gentle, fragrance-free soaps and laundry detergent. . Use moisturizers, preferably creams or ointments rather than lotions because the thicker consistency is better at preventing skin dehydration. Apply the cream or ointment within 15 minutes of showering. Reapply moisturizer at night, and moisturize your hands every time after you wash them.  Hair Loss (if your doctor says your hair will fall out)  . If your doctor says that your hair is likely to fall out, decide before you begin chemo whether you want to wear a wig. You may want to shop before treatment to match your hair color. . Hats, turbans, and scarves can also camouflage hair loss, although some people prefer to leave their heads uncovered. If you go bare-headed outdoors, be sure to use sunscreen on your scalp. . Cut your hair short. It eases the inconvenience of shedding lots of hair, but it also can reduce the emotional impact of watching your hair fall out. . Don't perm or color your hair during chemotherapy. Those chemical treatments are already damaging to hair and can enhance hair loss. Once your chemo treatments are done and your hair has grown back, it's OK to resume dyeing or perming hair. With chemotherapy, hair loss is almost always temporary. But when it grows back, it may be a different color or texture. In older adults who still had hair color before chemotherapy, the new growth may be completely gray.  Often, new hair is very fine and soft.  Infection Prevention Please wash your hands for at least 30 seconds using warm soapy water. Handwashing is the #1 way to prevent the spread of germs. Stay away from sick people or people who are getting over a cold. If you develop respiratory  systems such as green/yellow mucus production or productive cough or persistent cough let us know and we will see if you need an antibiotic. It is a good idea to keep a pair of gloves on when going into grocery stores/[employer] to decrease your risk of coming into contact with germs on the carts, etc. Carry alcohol hand gel with you at all times and use it frequently if out in public. If your temperature reaches 100.5 or higher please call the clinic and let us know.  If it is after hours or on the weekend please go to the ER if your temperature is over 100.5.  Please have your own personal thermometer at home to use.    Sex and bodily fluids If you are going to have sex, a condom must be used to protect the person that isn't taking chemotherapy. Chemo can decrease your libido (sex drive). For a few days after chemotherapy, chemotherapy can be excreted through your bodily fluids.  When using the toilet please close the lid and flush the toilet twice.  Do this for a few day after you have had chemotherapy.   Effects of chemotherapy on your sex life Some changes are simple and won't last long. They won't affect your sex life permanently.  Sometimes you may feel: . too tired . not strong enough to be very active . sick or sore  . not in the mood . anxious or low Your anxiety might not seem related to sex. For example, you may be worried about the cancer and how your treatment is going. Or you may be worried about money, or about how you family are coping with your illness. These things can cause stress, which can affect your interest in sex. It's important to talk to your partner about how you feel. Remember - the changes to your sex life don't usually last long. There's usually no medical reason to stop having sex during chemo. The drugs won't have any long term physical effects on your performance or enjoyment of sex. Cancer can't be passed on to your partner during sex  Contraception It's important to  use reliable contraception during treatment. Avoid getting pregnant while you or your partner are having chemotherapy. This is because the drugs may harm the baby. Sometimes chemotherapy drugs can leave a man or woman infertile.  This means you would not be able to have children in the future. You might want to talk to someone about permanent infertility. It can be very difficult to learn that you may no longer be able to have children. Some people find counselling helpful. There might be ways to preserve your fertility, although this is easier for men than for women. You may want to speak to a fertility expert. You can talk about sperm banking or harvesting your eggs. You can also ask about other fertility options, such as donor eggs. If you have or have had breast cancer, your doctor might advise you not to take the contraceptive pill. This is because the hormones in it might affect the cancer.  It is not known for sure whether or not chemotherapy drugs can be passed on through semen or secretions from the vagina. Because of this some doctors advise people to use a barrier method if you have sex during treatment. This applies to vaginal, anal or oral sex. Generally, doctors advise a barrier method only for the time you are actually having the treatment and for about a week after your treatment. Advice like this can be worrying, but this does not mean that you have to avoid being intimate with your partner. You can still have close contact with your partner and continue to enjoy sex.  Animals If you have cats or birds we just ask that you not change the litter or change the cage.  Please have someone else do this for you while you are on chemotherapy.   Food Safety During and After Cancer Treatment Food safety is important for people both during and after cancer treatment. Cancer and cancer treatments, such as chemotherapy, radiation therapy, and stem cell/bone marrow transplantation, often weaken the  immune system. This makes it harder for your body to protect itself from foodborne illness, also called food poisoning. Foodborne illness is caused by eating food that contains harmful bacteria, parasites, or viruses.  Foods to avoid Some foods have a higher risk of becoming tainted with bacteria. These include: Marland Kitchen Unwashed fresh fruit and vegetables, especially leafy vegetables that can hide dirt and other contaminants . Raw sprouts, such as alfalfa sprouts . Raw or undercooked beef, especially ground beef, or other raw or undercooked meat and poultry . Fatty, fried, or spicy foods immediately before or after treatment.  These can sit heavy on your stomach and make you feel  nauseous. . Raw or undercooked shellfish, such as oysters. . Sushi and sashimi, which often contain raw fish.  . Unpasteurized beverages, such as unpasteurized fruit juices, raw milk, raw yogurt, or cider . Undercooked eggs, such as soft boiled, over easy, and poached; raw, unpasteurized eggs; or foods made with raw egg, such as homemade raw cookie dough and homemade mayonnaise Simple steps for food safety Shop smart. . Do not buy food stored or displayed in an unclean area. . Do not buy bruised or damaged fruits or vegetables. . Do not buy cans that have cracks, dents, or bulges. . Pick up foods that can spoil at the end of your shopping trip and store them in a cooler on the way home. Prepare and clean up foods carefully. . Rinse all fresh fruits and vegetables under running water, and dry them with a clean towel or paper towel. . Clean the top of cans before opening them. . After preparing food, wash your hands for 20 seconds with hot water and soap. Pay special attention to areas between fingers and under nails. . Clean your utensils and dishes with hot water and soap. Marland Kitchen Disinfect your kitchen and cutting boards using 1 teaspoon of liquid, unscented bleach mixed into 1 quart of water.   Dispose of old food. . Eat  canned and packaged food before its expiration date (the "use by" or "best before" date). . Consume refrigerated leftovers within 3 to 4 days. After that time, throw out the food. Even if the food does not smell or look spoiled, it still may be unsafe. Some bacteria, such as Listeria, can grow even on foods stored in the refrigerator if they are kept for too long. Take precautions when eating out. . At restaurants, avoid buffets and salad bars where food sits out for a long time and comes in contact with many people. Food can become contaminated when someone with a virus, often a norovirus, or another "bug" handles it. . Put any leftover food in a "to-go" container yourself, rather than having the server do it. And, refrigerate leftovers as soon as you get home. . Choose restaurants that are clean and that are willing to prepare your food as you order it cooked.   MEDICATIONS:                                                                                                                                                                Compazine/Prochlorperazine 10mg  tablet. Take 1 tablet every 6 hours as needed for nausea/vomiting. (This can make you sleepy)   EMLA cream. Apply a quarter size amount to port site 1 hour prior to chemo. Do not rub in. Cover with plastic wrap.   Over-the-Counter Meds:  Colace - 100 mg capsules - take  2 capsules daily.  If this doesn't help then you can increase to 2 capsules twice daily.  Call us if this does not help your bowels move.   Imodium 2mg  capsule. Take 2 capsules after the 1st loose stool and then 1 capsule every 2 hours until you go a total of 12 hours without having a loose stool. Call the [facility] if loose stools continue. If diarrhea occurs at bedtime, take 2 capsules at bedtime. Then take 2 capsules every 4 hours until morning. Call [facility].    Diarrhea Sheet   If you are having loose stools/diarrhea, please purchase Imodium and  begin taking as outlined:  At the first sign of poorly formed or loose stools you should begin taking Imodium (loperamide) 2 mg capsules.  Take two caplets (4mg ) followed by one caplet (2mg ) every 2 hours until you have had no diarrhea for 12 hours.  During the night take two caplets (4mg ) at bedtime and continue every 4 hours during the night until the morning.  Stop taking Imodium only after there is no sign of diarrhea for 12 hours.    Always call the [facility] if you are having loose stools/diarrhea that you can't get under control.  Loose stools/diarrhea leads to dehydration (loss of water) in your body.  We have other options of trying to get the loose stools/diarrhea to stop but you must let us know!   Constipation Sheet  Colace - 100 mg capsules - take 2 capsules daily.  If this doesn't help then you can increase to 2 capsules twice daily.  Please call if the above does not work for you.   Do not go more than 2 days without a bowel movement.  It is very important that you do not become constipated.  It will make you feel sick to your stomach (nausea) and can cause abdominal pain and vomiting.   Nausea Sheet   Compazine/Prochlorperazine 10mg  tablet. Take 1 tablet every 6 hours as needed for nausea/vomiting. (This can make you sleepy)  If you are having persistent nausea (nausea that does not stop) please call the [facility] and let us know the amount of nausea that you are experiencing.  If you begin to vomit, you need to call the [facility] and if it is the weekend and you have vomited more than one time and can't get it to stop-go to the Emergency Room.  Persistent nausea/vomiting can lead to dehydration (loss of fluid in your body) and will make you feel terrible.   Ice chips, sips of clear liquids, foods that are @ room temperature, crackers, and toast tend to be better tolerated.   SYMPTOMS TO REPORT AS SOON AS POSSIBLE AFTER TREATMENT:   FEVER GREATER THAN 100.5  F  CHILLS WITH OR WITHOUT FEVER  NAUSEA AND VOMITING THAT IS NOT CONTROLLED WITH YOUR NAUSEA MEDICATION  UNUSUAL SHORTNESS OF BREATH  UNUSUAL BRUISING OR BLEEDING  TENDERNESS IN MOUTH AND THROAT WITH OR WITHOUT PRESENCE OF ULCERS  URINARY PROBLEMS  BOWEL PROBLEMS  UNUSUAL RASH      Wear comfortable clothing and clothing appropriate for easy access to any Portacath or PICC line. Let us know if there is anything that we can do to make your therapy better!    What to do if you need assistance after hours or on the weekends: CALL [phone].  HOLD on the line, do not hang up.  You will hear multiple messages but at the end you will be  connected with a nurse triage line.  They will contact the doctor if necessary.  Most of the time they will be able to assist you.  Do not call the hospital operator.      I have been informed and understand all of the instructions given to me and have received a copy. I have been instructed to call the clinic [phone] or my family physician as soon as possible for continued medical care, if indicated. I do not have any more questions at this time but understand that I may call the [facility] or the Patient Navigator at [phone] during office hours should I have questions or need assistance in obtaining follow-up care.

## 2018-06-17 DIAGNOSIS — G4733 Obstructive sleep apnea (adult) (pediatric): Secondary | ICD-10-CM | POA: Diagnosis not present

## 2018-06-20 ENCOUNTER — Other Ambulatory Visit (HOSPITAL_COMMUNITY): Payer: Medicare HMO

## 2018-06-20 ENCOUNTER — Encounter: Payer: Self-pay | Admitting: Radiation Oncology

## 2018-06-20 ENCOUNTER — Other Ambulatory Visit: Payer: Self-pay

## 2018-06-20 ENCOUNTER — Ambulatory Visit
Admission: RE | Admit: 2018-06-20 | Discharge: 2018-06-20 | Disposition: A | Discharge status: Home / Self Care | Payer: Medicare HMO | Source: Ambulatory Visit | Attending: Radiation Oncology | Admitting: Radiation Oncology

## 2018-06-20 ENCOUNTER — Ambulatory Visit (HOSPITAL_COMMUNITY): Payer: Medicare HMO

## 2018-06-20 ENCOUNTER — Ambulatory Visit (HOSPITAL_COMMUNITY): Payer: Medicare HMO | Admitting: Hematology

## 2018-06-20 VITALS — BP 157/80 | HR 83 | Temp 98.5°F | Resp 20 | Ht 67.0 in | Wt 193.6 lb

## 2018-06-20 DIAGNOSIS — C3492 Malignant neoplasm of unspecified part of left bronchus or lung: Secondary | ICD-10-CM | POA: Diagnosis not present

## 2018-06-20 DIAGNOSIS — I10 Essential (primary) hypertension: Secondary | ICD-10-CM | POA: Diagnosis not present

## 2018-06-20 DIAGNOSIS — Z9981 Dependence on supplemental oxygen: Secondary | ICD-10-CM | POA: Diagnosis not present

## 2018-06-20 DIAGNOSIS — C3490 Malignant neoplasm of unspecified part of unspecified bronchus or lung: Secondary | ICD-10-CM

## 2018-06-20 DIAGNOSIS — Z79899 Other long term (current) drug therapy: Secondary | ICD-10-CM | POA: Insufficient documentation

## 2018-06-20 DIAGNOSIS — C342 Malignant neoplasm of middle lobe, bronchus or lung: Secondary | ICD-10-CM | POA: Diagnosis not present

## 2018-06-20 DIAGNOSIS — G893 Neoplasm related pain (acute) (chronic): Secondary | ICD-10-CM | POA: Diagnosis not present

## 2018-06-20 DIAGNOSIS — R51 Headache: Secondary | ICD-10-CM | POA: Diagnosis not present

## 2018-06-20 DIAGNOSIS — Z8673 Personal history of transient ischemic attack (TIA), and cerebral infarction without residual deficits: Secondary | ICD-10-CM | POA: Diagnosis not present

## 2018-06-20 DIAGNOSIS — Z87891 Personal history of nicotine dependence: Secondary | ICD-10-CM | POA: Diagnosis not present

## 2018-06-20 DIAGNOSIS — C7951 Secondary malignant neoplasm of bone: Secondary | ICD-10-CM

## 2018-06-20 DIAGNOSIS — J449 Chronic obstructive pulmonary disease, unspecified: Secondary | ICD-10-CM | POA: Diagnosis not present

## 2018-06-20 DIAGNOSIS — R69 Illness, unspecified: Secondary | ICD-10-CM | POA: Diagnosis not present

## 2018-06-20 DIAGNOSIS — R0602 Shortness of breath: Secondary | ICD-10-CM | POA: Diagnosis not present

## 2018-06-20 NOTE — Progress Notes (Signed)
Thoracic Location of Tumor / Histology: Small cell lung cancer   Patient presented with breathing difficulty with shortness of breath on exertion and on and off chest pain for the last 3 weeks.  Went to the ED on 06/04/2018.  CT CAP 06/04/2018: It showed progression of the left hilar adenopathy compared to the prior exam from 2 months ago.  This currently measures about 2.3 x 4 cm in size.  Subcarinal lymph node also increased in size compared to prior exam.  No new areas were seen.  CT CAP 04/06/18: No evidence of disease progression.  Previously measured mediastinal lymph nodes are barely detectable.  Dominant left hilar lymph node measures 2.1 x 0.9 cm.  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  Mild T11 compression fracture with less than 10% height loss was also seen.  CT Chest/Abdomen 01/25/2018: near complete resolution of the adenopathy and lung mass.  Sclerosis and bone mets noted.  Biopsies of  Tobacco/Marijuana/Snuff/ETOH use: Former 77 years 1-2 P/D No drug or alcohol usage   Past/Anticipated interventions by cardiothoracic surgery, if any:   Past/Anticipated interventions by medical oncology, if any:  Dr. Delton Coombes 06/08/2018 -4 cycles of carboplatin, etoposide and Atezolizumab from 11/27/17-01/29/18. -Maintenance Atezolizumab started on 02/28/18, second dose on 03/22/18, each time resulting in severe urticarial rash on the upper trunk and lower extremities, improved on steroid therapy. - He was sent for second opinion with Dr. Berta Minor at [facility], and Dr. Royanne Foots in dermatology. -Another trial of Huey Bienenstock was done, last given on 05/18/2018. - I have reviewed the images of the CT scan independently and discussed the results with the patient.  It showed progression of the left hilar adenopathy compared to the prior exam from 2 months ago.  At this time I have recommended discontinuing Atezolizumab.  He had a relapse within 6 months after finishing primary therapy. -I have  recommended Topotecan at 1.5 mg/m/day for 5 days every 21 days.  There is no data if the combination immunotherapy with nivolumab and ipilimumab will work after disease progression on Atezolizumab.  Docetaxel is also 1 of the options. -I will make a referral to Dr. Elmyra Ricks at [facility] to see if they have any clinical trials.  We will see him back after the consultation.   Signs/Symptoms Weight changes, if any:  Wt Readings from Last 3 Encounters:  06/20/18 193 lb 9.6 oz (87.8 kg)  06/08/18 198 lb 1.6 oz (89.9 kg)  06/04/18 196 lb (88.9 kg)     Respiratory complaints, if any: SOB O2 2L/N continuous,coughing clear to white thick secretion and wheezing at times  Hemoptysis, if any: No    Pain issues, if any: 6/10 low back and hips especially left hip taking oxycodone   SAFETY ISSUES: yes,uese a cane prn  Prior radiation? : No  Pacemaker/ICD? : No  Possible current pregnancy?>N/A  Is the patient on methotrexate?: No   Current Complaints / other details:   Wt Readings from Last 3 Encounters:  06/20/18 193 lb 9.6 oz (87.8 kg)  06/08/18 198 lb 1.6 oz (89.9 kg)  06/04/18 196 lb (88.9 kg)  BP (!) 157/80 (Patient Position: Sitting)   Pulse 83   Temp 98.5 F (36.9 C) (Oral)   Resp 20   Ht 5\' 7"  (1.702 m)   Wt 193 lb 9.6 oz (87.8 kg)   SpO2 98%   BMI 30.32 kg/m

## 2018-06-20 NOTE — Progress Notes (Signed)
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 A diagnosis of Malignant neoplasm of unspecified part of unspecified bronchus or lung ([place]) was also pertinent to this visit.   HISTORY OF PRESENT ILLNESS: [name] [name] is a 57 y.o. male seen at the request of Dr. Delton Coombes for a history of extensive stage small cell lung cancer. The patient was diagnosed with his cancer in March 2019 and it was found in the left central lung, regional nodes, spine and pelvis, and liver. He received carboplatin/etopiside and Atezolizumab. He had some dermatologic changes requiring steroids and was able to go back to consolidation Atezolizumab. He has had recent progression noted in the primary tumor site in his chest, and had a CT angiogram of the chest with CT abdomen pelvis on 06/04/2018.  The scan revealed that the left hilar adenopathy has progressed to measuring 4 x 2.3 cm, 6 mm AP window node also increased in size, 13 mm subcarinal node was increased, and stable sclerotic lesions between T3, T11, T10 were noted.  There was also disease read as stable sclerotic change in L5, L2-3 degenerative changes, and stable right iliac sclerotic disease.  He comes today to discuss options of treatment.     PREVIOUS RADIATION THERAPY: No   PAST MEDICAL HISTORY:  Past Medical History:  Diagnosis Date  . Anxiety   . Asthma   . COPD (chronic obstructive pulmonary disease) ([place])   . DDD (degenerative disc disease)   . Diverticulitis   . GERD (gastroesophageal reflux disease)   . Headache   . Hepatitis    in the late 70's   . HTN (hypertension)   . Mediastinal adenopathy   . Seizures ([place])     as of 2019 none for 20 years  . Sleep apnea    does not  use cpap  . Stroke [facility])    TIA - Feb. 2018       PAST SURGICAL HISTORY: Past Surgical History:  Procedure Laterality Date  . CERVICAL DISC SURGERY     X4  . CHOLECYSTECTOMY    . COLONOSCOPY  08/24/2011   Procedure: COLONOSCOPY;  Surgeon: Daneil Dolin, MD;  Location: [facility];  Service: Endoscopy;  Laterality: N/A;  7:30AM  . ELBOW SURGERY     right  . PORTACATH PLACEMENT Right 12/01/2017   Procedure: INSERTION PORT-A-CATH;  Surgeon: Aviva Signs, MD;  Location: [facility] ORS;  Service: General;  Laterality: Right;  Marland Kitchen VIDEO BRONCHOSCOPY WITH ENDOBRONCHIAL ULTRASOUND N/A 10/31/2017   Procedure: VIDEO BRONCHOSCOPY WITH ENDOBRONCHIAL ULTRASOUND;  Surgeon: Ivin Poot, MD;  Location: [place];  Service: Thoracic;  Laterality: N/A;  . VIDEO BRONCHOSCOPY WITH ENDOBRONCHIAL ULTRASOUND N/A 11/22/2017   Procedure: VIDEO BRONCHOSCOPY WITH ENDOBRONCHIAL ULTRASOUND;  Surgeon: Melrose Nakayama, MD;  Location: [place];  Service: Thoracic;  Laterality: N/A;     FAMILY HISTORY:  Family History  Problem Relation Age of Onset  . Hypertension Father   . Heart attack Father   . Kidney failure Mother   . Diabetes Mother   . Hypertension Mother   . Colon cancer Neg Hx   . Anesthesia problems Neg Hx   .  Hypotension Neg Hx   . Malignant hyperthermia Neg Hx   . Pseudochol deficiency Neg Hx      SOCIAL HISTORY:  reports that he quit smoking about 8 months ago. His smoking use included cigarettes. He has a 45.00 pack-year smoking history. He has never used smokeless tobacco. He reports that he does not drink alcohol or use drugs. The patient is married and lives in [place]. 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    ALLERGIES: Xgeva [denosumab] and Tecentriq [atezolizumab]   MEDICATIONS:  Current Outpatient Medications  Medication Sig Dispense Refill  . albuterol (PROVENTIL HFA;VENTOLIN HFA) 108 (90 BASE) MCG/ACT inhaler  Inhale 2 puffs into the lungs every 6 (six) hours as needed for wheezing or shortness of breath.     Marland Kitchen albuterol (PROVENTIL) (2.5 MG/3ML) 0.083% nebulizer solution Take 2.5 mg by nebulization every 6 (six) hours as needed for wheezing or shortness of breath.     . alprazolam (XANAX) 2 MG tablet Take 2 mg by mouth every 6 (six) hours as needed for sleep or anxiety.     Marland Kitchen amLODipine (NORVASC) 10 MG tablet Take 10 mg by mouth daily.    Marland Kitchen CALCIUM-MAGNESIUM-ZINC PO Take 1,200 mg by mouth daily.    . COLLAGEN PO Take 7 g by mouth daily.    . cyclobenzaprine (FLEXERIL) 10 MG tablet Take 10 mg by mouth daily as needed for muscle spasms.     . diclofenac sodium (VOLTAREN) 1 % GEL Apply 2 g topically 4 (four) times daily. Apply to left forearm phlebitic area 100 g 1  . docusate sodium (COLACE) 100 MG capsule Take 1 capsule (100 mg total) by mouth every 12 (twelve) hours. 60 capsule 0  . Flaxseed, Linseed, (FLAXSEED OIL PO) Take 1 tablet by mouth 2 (two) times daily.     . fluticasone (FLONASE) 50 MCG/ACT nasal spray Place 2 sprays into both nostrils daily as needed for allergies.     . furosemide (LASIX) 20 MG tablet Take 10 mg by mouth daily.     . Glucosamine-Chondroit-Vit C-Mn (GLUCOSAMINE 1500 COMPLEX) CAPS Take 1 capsule by mouth.    . levETIRAcetam (KEPPRA) 500 MG tablet Take 500 mg by mouth daily.     Marland Kitchen lidocaine (XYLOCAINE) 2 % solution Use as directed 15 mLs in the mouth or throat every 4 (four) hours as needed for mouth pain.     Marland Kitchen lidocaine-prilocaine (EMLA) cream APPLY TO PORTACATH SITE AS DIRECTED 30 g 0  . lisinopril (PRINIVIL,ZESTRIL) 20 MG tablet Take 20 mg by mouth 2 (two) times daily.    . MULTIPLE VITAMINS PO Take 1 tablet by mouth daily.     . naproxen (NAPROSYN) 500 MG tablet Take 500 mg by mouth 2 (two) times daily with a meal.      . oxyCODONE (ROXICODONE) 15 MG immediate release tablet Take 15 mg by mouth 4 (four) times daily as needed for pain.     . pantoprazole (PROTONIX) 40 MG  tablet Take 40 mg by mouth at bedtime.     . polyethylene glycol (MIRALAX / GLYCOLAX) packet Take 17 g by mouth daily. 14 each 0  . pravastatin (PRAVACHOL) 20 MG tablet Take 20 mg by mouth daily.     . tamsulosin (FLOMAX) 0.4 MG CAPS capsule Take 0.4 mg by mouth daily.     Marland Kitchen triamcinolone cream (KENALOG) 0.1 % Apply 1 application topically 2 (two) times daily.     . TURMERIC PO Take 4 tablets by mouth  daily.     . Ipilimumab (YERVOY IV) Inject into the vein.    Marland Kitchen meclizine (ANTIVERT) 12.5 MG tablet Take 1 tablet (12.5 mg total) by mouth 3 (three) times daily as needed for dizziness. (Patient not taking: Reported on 06/20/2018) 30 tablet 0  . mupirocin ointment (BACTROBAN) 2 % APPLY TO SKIN TID PRN  0  . Nivolumab (OPDIVO IV) Inject into the vein.    . predniSONE (DELTASONE) 10 MG tablet Take 1 tablet (10 mg total) by mouth daily with breakfast. (Patient not taking: Reported on 06/20/2018) 30 tablet 3  . prochlorperazine (COMPAZINE) 10 MG tablet Take 1 tablet (10 mg total) by mouth every 6 (six) hours as needed for nausea or vomiting. (Patient not taking: Reported on 06/20/2018) 30 tablet 3  . temazepam (RESTORIL) 30 MG capsule TAKE 1 CAPSULE AT BEDTIME AS NEEDED FOR SLEEP (Patient not taking: Reported on 06/20/2018) 30 capsule 0   No current facility-administered medications for this encounter.      REVIEW OF SYSTEMS: On review of systems, the patient reports that he is doing okay. He reports in the last 3-4 weeks he has had progressive changes in pain, particularly in his lumbar spine, and deep into his left buttock and has a hard time sitting comfortably.  He moves from a seated to standing position multiple times during the assessment.  He reports pain between his shoulder blades, and states that this is also come on recently in the last 4 weeks or so.  The most intense pain is in his lower back and left hip region.  His breathing is also abruptly changed in the last few weeks, requiring him to be  on tenuous oxygen at 2 L.  He feels as though his breathing is quite restricted.  He denies any cough, fevers, chills, night sweats, unintended weight changes.  He denies any bowel or bladder disturbances, and denies abdominal pain, nausea or vomiting.  He continues to take oxycodone for his pain and uses short acting only.  He has several strengths that he takes at different times during the day which seemed to keep his symptoms mildly controlled, and he has a long-standing history of chronic back pain requiring narcotic medication. He denies any other musculoskeletal or joint aches or pains.  He reports that in the last 2 to 4 weeks he also had episodes of intermittent headaches that can be severe particularly in the left side of his head.  His wife admits that he has had several episodes of confusion but he does not quite recall.  He denies frank dizziness but states that from time to time he feels slightly off balance.  These changes have also come on in the same timeframe.  A complete review of systems is obtained and is otherwise negative.     PHYSICAL EXAM:  Wt Readings from Last 3 Encounters:  06/20/18 193 lb 9.6 oz (87.8 kg)  06/08/18 198 lb 1.6 oz (89.9 kg)  06/04/18 196 lb (88.9 kg)   Temp Readings from Last 3 Encounters:  06/20/18 98.5 F (36.9 C) (Oral)  06/08/18 97.8 F (36.6 C) (Oral)  06/04/18 98.3 F (36.8 C) (Oral)   BP Readings from Last 3 Encounters:  06/20/18 (!) 157/80  06/08/18 (!) 158/70  06/04/18 (!) 179/83   Pulse Readings from Last 3 Encounters:  06/20/18 83  06/08/18 78  06/04/18 96   Pain Assessment Pain Score: 6 (Bilateral hips and low back) Pain Frequency: Constant Pain Loc: Hip/10  In general  this is a chronically ill-appearing Caucasian male in no acute distress. He is alert and oriented x4 and appropriate throughout the examination. HEENT reveals that the patient is normocephalic, atraumatic. EOMs are intact.  He appears to be grossly intact  neurologically without any focal deficits.  He does not have any disturbed or abnormal appearing gait.  Skin is intact without any evidence of gross lesions. Cardiopulmonary assessment is negative for acute distress and he exhibits normal effort.     ECOG = 2  0 - Asymptomatic (Fully active, able to carry on all predisease activities without restriction)  1 - Symptomatic but completely ambulatory (Restricted in physically strenuous activity but ambulatory and able to carry out work of a light or sedentary nature. For example, light housework, office work)  2 - Symptomatic, <50% in bed during the day (Ambulatory and capable of all self care but unable to carry out any work activities. Up and about more than 50% of waking hours)  3 - Symptomatic, >50% in bed, but not bedbound (Capable of only limited self-care, confined to bed or chair 50% or more of waking hours)  4 - Bedbound (Completely disabled. Cannot carry on any self-care. Totally confined to bed or chair)  5 - Death   Eustace Pen MM, Creech RH, Tormey DC, et al. [phone]). "Toxicity and response criteria of the [facility]". [place] Oncol. 5 (6): 649-55    LABORATORY DATA:  Lab Results  Component Value Date   WBC 11.4 (H) 06/04/2018   HGB 14.1 06/04/2018   HCT 41.6 06/04/2018   MCV 92.7 06/04/2018   PLT 197 06/04/2018   Lab Results  Component Value Date   NA 138 06/04/2018   K 4.3 06/04/2018   CL 102 06/04/2018   CO2 25 06/04/2018   Lab Results  Component Value Date   ALT 17 06/04/2018   AST 22 06/04/2018   ALKPHOS 65 06/04/2018   BILITOT 0.7 06/04/2018      RADIOGRAPHY: Dg Chest 2 View  Result Date: 05/25/2018 CLINICAL DATA:  Worsening shortness of breath for 1 week. COPD. Small cell lung cancer. EXAM: CHEST - 2 VIEW COMPARISON:  Chest x-rays dated 04/28/2018 and 12/01/2017. FINDINGS: Heart size and mediastinal contours are stable. Lungs are clear. No pleural effusion or pneumothorax seen.  RIGHT chest wall Port-A-Cath is stable in position with tip at the level of the upper SVC. No acute or suspicious osseous finding. IMPRESSION: No active cardiopulmonary disease. No evidence of pneumonia or pulmonary edema. Electronically Signed   By: Franki Cabot M.D.   On: 05/25/2018 14:05   Ct Angio Chest Pe W And/or Wo Contrast  Result Date: 06/04/2018 CLINICAL DATA:  Shortness of breath, acute right flank and lower back pain. History of metastatic lung cancer. EXAM: CT ANGIOGRAPHY CHEST CT ABDOMEN AND PELVIS WITH CONTRAST TECHNIQUE: Multidetector CT imaging of the chest was performed using the standard protocol during bolus administration of intravenous contrast. Multiplanar CT image reconstructions and MIPs were obtained to evaluate the vascular anatomy. Multidetector CT imaging of the abdomen and pelvis was performed using the standard protocol during bolus administration of intravenous contrast. CONTRAST:  150mL ISOVUE-370 IOPAMIDOL (ISOVUE-370) INJECTION 76% COMPARISON:  CT scan April 06, 2018. FINDINGS: CTA CHEST FINDINGS Cardiovascular: Satisfactory opacification of the pulmonary arteries to the segmental level. No evidence of pulmonary embolism. However, there is significant narrowing of the lower lobe branch of the left pulmonary artery due to surrounding adenopathy. Normal heart size. No pericardial effusion. There  is no evidence of thoracic aortic dissection or aneurysm. Mediastinum/Nodes: Thyroid gland and esophagus are unremarkable. There is significant progression of left hilar adenopathy compared to prior exam, currently measuring 4.0 x 2.3 cm in size. 6 mm nodule is noted in aortopulmonary window which is increased in size compared to prior exam. 13 mm subcarinal lymph node is noted which is increased compared to prior exam. Lungs/Pleura: No pneumothorax or pleural effusion is noted. Grossly stable 4 mm subpleural nodule is noted in left upper lobe laterally best seen on image number 54 of  series 8. Grossly stable 4 mm nodule is noted in the adjacent region with stable associated ground-glass opacity. Right lung is clear. No new abnormality is noted. Musculoskeletal: Stable sclerotic densities are noted at T3 and T11 as well as T10 consistent with metastatic disease. Review of the MIP images confirms the above findings. CT ABDOMEN and PELVIS FINDINGS Hepatobiliary: No focal liver abnormality is seen. Status post cholecystectomy. No biliary dilatation. Pancreas: Unremarkable. No pancreatic ductal dilatation or surrounding inflammatory changes. Spleen: Normal in size without focal abnormality. Adrenals/Urinary Tract: Adrenal glands are unremarkable. Kidneys are normal, without renal calculi, focal lesion, or hydronephrosis. Bladder is unremarkable. Stomach/Bowel: Stomach is within normal limits. Appendix appears normal. No evidence of bowel wall thickening, distention, or inflammatory changes. Vascular/Lymphatic: No significant vascular findings are present. No enlarged abdominal or pelvic lymph nodes. Reproductive: Prostate is unremarkable. Other: No abdominal wall hernia or abnormality. No abdominopelvic ascites. Musculoskeletal: Stable sclerotic density seen and L5 consistent with metastatic disease. Severe degenerative disc disease is noted at L2-3. Stable right iliac sclerotic density is noted consistent with metastatic disease. Review of the MIP images confirms the above findings. IMPRESSION: No definite evidence of pulmonary embolus. However, there is severe narrowing of the lower lobe branch of the left pulmonary artery and its more peripheral vessels secondary to external compression from significantly increased left hilar adenopathy, due to metastatic disease. Subcarinal and aortopulmonary window adenopathy is also noted consistent with metastatic disease which has progressed since prior exam. Stable subpleural nodularity and ground-glass opacity seen laterally in left upper lobe. Continued CT  follow-up is recommended to ensure stability. Stable sclerotic lesions are noted in the thoracic and lumbar spine as well as the pelvis consistent with osseous metastases. Electronically Signed   By: Marijo Conception, M.D.   On: 06/04/2018 12:37   Ct Abdomen Pelvis W Contrast  Result Date: 06/04/2018 CLINICAL DATA:  Shortness of breath, acute right flank and lower back pain. History of metastatic lung cancer. EXAM: CT ANGIOGRAPHY CHEST CT ABDOMEN AND PELVIS WITH CONTRAST TECHNIQUE: Multidetector CT imaging of the chest was performed using the standard protocol during bolus administration of intravenous contrast. Multiplanar CT image reconstructions and MIPs were obtained to evaluate the vascular anatomy. Multidetector CT imaging of the abdomen and pelvis was performed using the standard protocol during bolus administration of intravenous contrast. CONTRAST:  129mL ISOVUE-370 IOPAMIDOL (ISOVUE-370) INJECTION 76% COMPARISON:  CT scan April 06, 2018. FINDINGS: CTA CHEST FINDINGS Cardiovascular: Satisfactory opacification of the pulmonary arteries to the segmental level. No evidence of pulmonary embolism. However, there is significant narrowing of the lower lobe branch of the left pulmonary artery due to surrounding adenopathy. Normal heart size. No pericardial effusion. There is no evidence of thoracic aortic dissection or aneurysm. Mediastinum/Nodes: Thyroid gland and esophagus are unremarkable. There is significant progression of left hilar adenopathy compared to prior exam, currently measuring 4.0 x 2.3 cm in size. 6 mm nodule is noted in aortopulmonary  window which is increased in size compared to prior exam. 13 mm subcarinal lymph node is noted which is increased compared to prior exam. Lungs/Pleura: No pneumothorax or pleural effusion is noted. Grossly stable 4 mm subpleural nodule is noted in left upper lobe laterally best seen on image number 54 of series 8. Grossly stable 4 mm nodule is noted in the adjacent  region with stable associated ground-glass opacity. Right lung is clear. No new abnormality is noted. Musculoskeletal: Stable sclerotic densities are noted at T3 and T11 as well as T10 consistent with metastatic disease. Review of the MIP images confirms the above findings. CT ABDOMEN and PELVIS FINDINGS Hepatobiliary: No focal liver abnormality is seen. Status post cholecystectomy. No biliary dilatation. Pancreas: Unremarkable. No pancreatic ductal dilatation or surrounding inflammatory changes. Spleen: Normal in size without focal abnormality. Adrenals/Urinary Tract: Adrenal glands are unremarkable. Kidneys are normal, without renal calculi, focal lesion, or hydronephrosis. Bladder is unremarkable. Stomach/Bowel: Stomach is within normal limits. Appendix appears normal. No evidence of bowel wall thickening, distention, or inflammatory changes. Vascular/Lymphatic: No significant vascular findings are present. No enlarged abdominal or pelvic lymph nodes. Reproductive: Prostate is unremarkable. Other: No abdominal wall hernia or abnormality. No abdominopelvic ascites. Musculoskeletal: Stable sclerotic density seen and L5 consistent with metastatic disease. Severe degenerative disc disease is noted at L2-3. Stable right iliac sclerotic density is noted consistent with metastatic disease. Review of the MIP images confirms the above findings. IMPRESSION: No definite evidence of pulmonary embolus. However, there is severe narrowing of the lower lobe branch of the left pulmonary artery and its more peripheral vessels secondary to external compression from significantly increased left hilar adenopathy, due to metastatic disease. Subcarinal and aortopulmonary window adenopathy is also noted consistent with metastatic disease which has progressed since prior exam. Stable subpleural nodularity and ground-glass opacity seen laterally in left upper lobe. Continued CT follow-up is recommended to ensure stability. Stable  sclerotic lesions are noted in the thoracic and lumbar spine as well as the pelvis consistent with osseous metastases. Electronically Signed   By: Marijo Conception, M.D.   On: 06/04/2018 12:37       IMPRESSION/PLAN: 1. 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  Dr. Lisbeth Renshaw reviews the findings from the patient's recent imaging scans from September.  He is going to be pursuing doublet immunotherapy with nivolumab and ipilimumab.  He is to begin this on Friday.  Dr. Lisbeth Renshaw reviews the findings in the thoracic and lumbosacral spine.  It appears that his disease in sacral region has been stable however his symptoms appear to have changed recently in a timeframe where he has been away from therapy.  This also seems to be the case in the thoracic region.  His breathing has also changed over that interval, and given the distribution of his tumor encasing the bronchus in the left hilar area, Dr. Lisbeth Renshaw recommends consideration of treatment to be sites.  After discussing the risks, benefits, short and long-term effects of therapy, the patient is interested in proceeding.  We will use wiring tape to identify the site of his most severe pain in the thoracic spine to better outline a treatment plan for this as well.  We discussed the risks, benefits short and long-term effects of therapy, and patient is interested in proceeding.Written consent is obtained and placed in the chart, a copy was provided to the patient.  The patient will return for simulation on Friday, and we anticipate starting his treatment early next week.  In the meantime for pain as a result of his cancer, he will continue his current regimen. 2. PCI.  The patient had been originally considered for PCI in our discussions with Dr. Delton Coombes, while he has had progression, Dr. Lisbeth Renshaw does discuss the potential benefit of considering this form of therapy.  As he has been having headaches however we feel it is important to repeat his MRI scan to rule  out any current disease.  We discussed the rationale for 25 Gy over 10 fractions for PCI versus 30 Gy in 10 fractions if he has disease.  We will proceed with the MRI scan, and reviewed this with him.  We will move forward accordingly, though his consent form did include plans to treat the brain at some point in the near future.  In a visit lasting 60 minutes, greater than 50% of the time was spent face to face discussing his case, and coordinating the patient's care.   The above documentation reflects my direct findings during this shared patient visit. Please see the separate note by Dr. Lisbeth Renshaw on this date for the remainder of the patient's plan of care.    Carola Rhine, PAC

## 2018-06-22 ENCOUNTER — Encounter (HOSPITAL_COMMUNITY): Payer: Self-pay | Admitting: Hematology

## 2018-06-22 ENCOUNTER — Inpatient Hospital Stay (HOSPITAL_COMMUNITY): Payer: Medicare HMO | Admitting: Hematology

## 2018-06-22 ENCOUNTER — Inpatient Hospital Stay (HOSPITAL_COMMUNITY): Payer: Medicare HMO

## 2018-06-22 ENCOUNTER — Ambulatory Visit
Admission: RE | Admit: 2018-06-22 | Discharge: 2018-06-22 | Disposition: A | Discharge status: Home / Self Care | Payer: Medicare HMO | Source: Ambulatory Visit | Attending: Radiation Oncology | Admitting: Radiation Oncology

## 2018-06-22 VITALS — BP 133/69 | HR 79 | Temp 98.0°F | Resp 19

## 2018-06-22 VITALS — BP 130/77 | HR 97 | Temp 97.9°F | Resp 20 | Wt 194.3 lb

## 2018-06-22 DIAGNOSIS — Z9981 Dependence on supplemental oxygen: Secondary | ICD-10-CM | POA: Diagnosis not present

## 2018-06-22 DIAGNOSIS — C3490 Malignant neoplasm of unspecified part of unspecified bronchus or lung: Secondary | ICD-10-CM

## 2018-06-22 DIAGNOSIS — Z23 Encounter for immunization: Secondary | ICD-10-CM | POA: Diagnosis not present

## 2018-06-22 DIAGNOSIS — K219 Gastro-esophageal reflux disease without esophagitis: Secondary | ICD-10-CM | POA: Diagnosis not present

## 2018-06-22 DIAGNOSIS — C3492 Malignant neoplasm of unspecified part of left bronchus or lung: Secondary | ICD-10-CM

## 2018-06-22 DIAGNOSIS — Z8673 Personal history of transient ischemic attack (TIA), and cerebral infarction without residual deficits: Secondary | ICD-10-CM | POA: Diagnosis not present

## 2018-06-22 DIAGNOSIS — L309 Dermatitis, unspecified: Secondary | ICD-10-CM | POA: Diagnosis not present

## 2018-06-22 DIAGNOSIS — Z87891 Personal history of nicotine dependence: Secondary | ICD-10-CM | POA: Diagnosis not present

## 2018-06-22 DIAGNOSIS — C7951 Secondary malignant neoplasm of bone: Secondary | ICD-10-CM | POA: Insufficient documentation

## 2018-06-22 DIAGNOSIS — C7931 Secondary malignant neoplasm of brain: Secondary | ICD-10-CM | POA: Insufficient documentation

## 2018-06-22 DIAGNOSIS — Z510 Encounter for antineoplastic radiation therapy: Secondary | ICD-10-CM | POA: Diagnosis present

## 2018-06-22 DIAGNOSIS — Z9221 Personal history of antineoplastic chemotherapy: Secondary | ICD-10-CM

## 2018-06-22 DIAGNOSIS — G4730 Sleep apnea, unspecified: Secondary | ICD-10-CM | POA: Diagnosis not present

## 2018-06-22 DIAGNOSIS — J449 Chronic obstructive pulmonary disease, unspecified: Secondary | ICD-10-CM | POA: Diagnosis not present

## 2018-06-22 DIAGNOSIS — F419 Anxiety disorder, unspecified: Secondary | ICD-10-CM | POA: Diagnosis not present

## 2018-06-22 DIAGNOSIS — Z7189 Other specified counseling: Secondary | ICD-10-CM

## 2018-06-22 DIAGNOSIS — Z79899 Other long term (current) drug therapy: Secondary | ICD-10-CM | POA: Diagnosis not present

## 2018-06-22 DIAGNOSIS — Z7952 Long term (current) use of systemic steroids: Secondary | ICD-10-CM

## 2018-06-22 DIAGNOSIS — I10 Essential (primary) hypertension: Secondary | ICD-10-CM | POA: Diagnosis not present

## 2018-06-22 DIAGNOSIS — C342 Malignant neoplasm of middle lobe, bronchus or lung: Secondary | ICD-10-CM | POA: Diagnosis not present

## 2018-06-22 DIAGNOSIS — Z5112 Encounter for antineoplastic immunotherapy: Secondary | ICD-10-CM | POA: Diagnosis present

## 2018-06-22 LAB — CBC WITH DIFFERENTIAL/PLATELET
Abs Immature Granulocytes: 0.02 10*3/uL (ref 0.00–0.07)
BASOS PCT: 1 %
Basophils Absolute: 0.1 10*3/uL (ref 0.0–0.1)
Eosinophils Absolute: 0.3 10*3/uL (ref 0.0–0.5)
Eosinophils Relative: 6 %
HCT: 40.7 % (ref 39.0–52.0)
Hemoglobin: 12.9 g/dL — ABNORMAL LOW (ref 13.0–17.0)
IMMATURE GRANULOCYTES: 0 %
Lymphocytes Relative: 23 %
Lymphs Abs: 1.2 10*3/uL (ref 0.7–4.0)
MCH: 29.1 pg (ref 26.0–34.0)
MCHC: 31.7 g/dL (ref 30.0–36.0)
MCV: 91.9 fL (ref 80.0–100.0)
MONO ABS: 0.6 10*3/uL (ref 0.1–1.0)
MONOS PCT: 11 %
NEUTROS PCT: 59 %
Neutro Abs: 3.1 10*3/uL (ref 1.7–7.7)
PLATELETS: 277 10*3/uL (ref 150–400)
RBC: 4.43 MIL/uL (ref 4.22–5.81)
RDW: 13.2 % (ref 11.5–15.5)
WBC: 5.3 10*3/uL (ref 4.0–10.5)
nRBC: 0 % (ref 0.0–0.2)

## 2018-06-22 LAB — COMPREHENSIVE METABOLIC PANEL
ALT: 16 U/L (ref 0–44)
AST: 19 U/L (ref 15–41)
Albumin: 3.9 g/dL (ref 3.5–5.0)
Alkaline Phosphatase: 54 U/L (ref 38–126)
Anion gap: 9 (ref 5–15)
BUN: 13 mg/dL (ref 6–20)
CHLORIDE: 108 mmol/L (ref 98–111)
CO2: 21 mmol/L — ABNORMAL LOW (ref 22–32)
Calcium: 8.7 mg/dL — ABNORMAL LOW (ref 8.9–10.3)
Creatinine, Ser: 0.92 mg/dL (ref 0.61–1.24)
GFR calc Af Amer: >60 mL/min (ref >60)
Glucose, Bld: 125 mg/dL — ABNORMAL HIGH (ref 70–99)
POTASSIUM: 3.8 mmol/L (ref 3.5–5.1)
Sodium: 138 mmol/L (ref 135–145)
Total Bilirubin: 0.7 mg/dL (ref 0.3–1.2)
Total Protein: 7.3 g/dL (ref 6.5–8.1)

## 2018-06-22 LAB — TSH: TSH: 0.485 u[IU]/mL (ref 0.350–4.500)

## 2018-06-22 LAB — LACTATE DEHYDROGENASE: LDH: 234 U/L — ABNORMAL HIGH (ref 98–192)

## 2018-06-22 MED ORDER — HEPARIN SOD (PORK) LOCK FLUSH 100 UNIT/ML IV SOLN
500.0000 [IU] | Freq: Once | INTRAVENOUS | Status: AC | PRN
  Administered 2018-06-22: 500 [IU]
  Filled 2018-06-22: qty 5

## 2018-06-22 MED ORDER — ZOLEDRONIC ACID 4 MG/100ML IV SOLN
4.0000 mg | Freq: Once | INTRAVENOUS | Status: AC
  Administered 2018-06-22: 4 mg via INTRAVENOUS
  Filled 2018-06-22: qty 100

## 2018-06-22 MED ORDER — SODIUM CHLORIDE 0.9 % IV SOLN
1.0000 mg/kg | Freq: Once | INTRAVENOUS | Status: AC
  Administered 2018-06-22: 90 mg via INTRAVENOUS
  Filled 2018-06-22: qty 18

## 2018-06-22 MED ORDER — FAMOTIDINE IN NACL 20-0.9 MG/50ML-% IV SOLN
20.0000 mg | Freq: Once | INTRAVENOUS | Status: AC
  Administered 2018-06-22: 20 mg via INTRAVENOUS
  Filled 2018-06-22: qty 50

## 2018-06-22 MED ORDER — DIPHENHYDRAMINE HCL 50 MG/ML IJ SOLN
25.0000 mg | Freq: Once | INTRAMUSCULAR | Status: AC
  Administered 2018-06-22: 25 mg via INTRAVENOUS
  Filled 2018-06-22: qty 1

## 2018-06-22 MED ORDER — SODIUM CHLORIDE 0.9 % IV SOLN
Freq: Once | INTRAVENOUS | Status: AC
  Administered 2018-06-22: 10:00:00 via INTRAVENOUS

## 2018-06-22 MED ORDER — INFLUENZA VAC SPLIT QUAD 0.5 ML IM SUSY
0.5000 mL | PREFILLED_SYRINGE | Freq: Once | INTRAMUSCULAR | Status: AC
  Administered 2018-06-22: 0.5 mL via INTRAMUSCULAR
  Filled 2018-06-22: qty 0.5

## 2018-06-22 MED ORDER — SODIUM CHLORIDE 0.9 % IV SOLN
3.0000 mg/kg | Freq: Once | INTRAVENOUS | Status: AC
  Administered 2018-06-22: 270 mg via INTRAVENOUS
  Filled 2018-06-22: qty 24

## 2018-06-22 NOTE — Progress Notes (Signed)
     Treatment complete. Pt tolerated well. Vital signs stable. Pt has complaints of pain in his right hip that he takes meds for at home that control pain well for him. Pt ambulated off the unit. Stable. F/U and appts taught. Pt verbalized an understanding.

## 2018-06-22 NOTE — Assessment & Plan Note (Signed)
1.  Extensive stage small cell lung cancer: -4 cycles of carboplatin, etoposide and Atezolizumab from 11/27/2017 through 01/29/2018 -CT scan of the chest, abdomen after 3 cycles of chemo immunotherapy on 01/25/2018 shows near complete resolution of the adenopathy and lung mass.  Sclerosis and bone mets noted.  - Maintenance Atezolizumab started on 02/28/2018, second dose on 03/22/2018, each time resulting in severe urticarial rash on the upper trunk and lower extremities, improved on steroid therapy. -He has gotten severe urticarial rash from Atezolizumab.  I have discussed the results of the CT scan of the chest, abdomen and pelvis dated 04/06/2018 which showed no evidence of disease progression.  Previously measured mediastinal lymph nodes are barely detectable.  Dominant left hilar lymph node measures 2.1 x 0.9 cm.  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  Mild T11 compression fracture with less than 10% height loss was also seen. - I have sent him for a second opinion with Dr. Elmyra Ricks at [facility]. -He was evaluated by Dr. Berta Minor on 04/19/2018.  He was also referred to [facility] dermatology Dr. Royanne Foots. -It was suggested to give another trial of Atezolizumab.  He discontinued dexamethasone 4 mg daily and started taking prednisone 10 mg daily.  He was also prescribed triamcinolone 0.1% cream to be used twice daily. - He developed breathing difficulty with shortness of breath on exertion and on and off chest pain for the last 3 weeks.  He reportedly went to the ER on 06/04/2018 with a did a CT scan of the chest PE protocol and CT of the abdomen and pelvis. - I have reviewed the images of the CT scan independently and discussed the results with the patient.  It showed progression of the left hilar adenopathy compared to the prior exam from 2 months ago.  This currently measures about 2.3 x 4 cm in size.  Subcarinal lymph node also increased in size compared to prior exam.   No new areas were seen. - Atezolizumab discontinued after last dose on 05/18/2018 secondary to progression. - He was evaluated by Dr. Elmyra Ricks at [facility].  Combination immunotherapy with ipilimumab and nivolumab was recommended, as responses with chemotherapy is very low as his cancer has progressed within 3 months of platinum induction regimen. - Today he will proceed with his cycle 1 of ipilimumab and nivolumab.  We talked about the side effects of combination immunotherapy in detail.  We plan to repeat scans after 2-3 cycles. -He was also evaluated by Dr. Lisbeth Renshaw for palliative radiation to the hip and spine for better pain control.  This will be started on Monday.  2. Bone metastasis: - He will continue receiving Zometa every 4 weeks.  3.  Skin rash: - He had severe skin rash (lichenoid dermatitis) on the trunk and upper thighs after each dose of Atezolizumab.  This responded very well to steroids. -She was evaluated by Dr. Koleen Distance [facility].  She prescribed him triamcinolone 0.1% cream to be applied twice daily. -She recommended to give another trial of Atezolizumab.  He will continue prednisone 10 mg daily.  He will continue to use the triamcinolone cream.  She would like to see him 4 to 5 days after Atezolizumab dose.  Hence we have made a follow-up visit with her on 05/03/2018.  He gets diffuse rash typically 7 days after each dose of Atezolizumab. - She would consider starting him on [place].  Cyclosporine at a dose of 1.5 mg to 2.5  mg per kilogram twice a day is also an option. -He did not get any rash after his last doses on 04/27/2018 and 05/18/2018.

## 2018-06-22 NOTE — Progress Notes (Signed)
[place], [place] [address]   CLINIC:  Medical Oncology/Hematology  PCP:  Redmond School, MD [address] 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     06/15/2018 -  Chemotherapy    The patient had ipilimumab (YERVOY) 90 mg in sodium chloride 0.9 % 50 mL chemo infusion, 1 mg/kg = 90 mg, Intravenous,  Once, 1 of 4 cycles Administration: 90 mg (06/22/2018) nivolumab (OPDIVO) 270 mg in sodium chloride 0.9 % 100 mL chemo infusion, 3 mg/kg = 270 mg, Intravenous, Once, 1 of 8 cycles Administration: 270 mg (06/22/2018)  for chemotherapy treatment.       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 male returns for routine follow-up for extensive stage small cell lung cancer. Patient is here today with his wife. He is having headaches and dizziness occasionally. He is on continuous home oxygen. He states his breathing is much improved. His pain is still there but today it is the best it has been in a while. He denies any new  pains. Denies any vomiting, or diarrhea. Denies any coughing or hemoptysis. He reports his appetite at 75% and he is maintaining his weight. His energy level is 50%.   REVIEW OF SYSTEMS:  Review of Systems  Constitutional: Positive for fatigue.  Respiratory: Positive for shortness of breath (on 02).   Gastrointestinal: Positive for nausea.  All other systems reviewed and are negative.    PAST MEDICAL/SURGICAL HISTORY:  Past Medical History:  Diagnosis Date  . Anxiety   . Asthma   . COPD (chronic obstructive pulmonary disease) ([place])   . DDD (degenerative disc disease)   . Diverticulitis   . GERD (gastroesophageal reflux disease)   . Headache   . Hepatitis    in the late 70's   . HTN (hypertension)   . Mediastinal adenopathy   . Seizures ([place])    as of 2019 none for 20 years  . Sleep apnea    does not  use cpap  . Stroke [facility])    TIA - Feb. 2018   Past Surgical History:  Procedure Laterality Date  . CERVICAL DISC SURGERY     X4  . CHOLECYSTECTOMY    . COLONOSCOPY  08/24/2011   Procedure: COLONOSCOPY;  Surgeon: Daneil Dolin, MD;  Location: [facility] ENDO SUITE;  Service: Endoscopy;  Laterality: N/A;  7:30AM  . ELBOW SURGERY     right  . PORTACATH PLACEMENT Right 12/01/2017   Procedure: INSERTION PORT-A-CATH;  Surgeon: Aviva Signs, MD;  Location: [facility] ORS;  Service: General;  Laterality: Right;  Marland Kitchen VIDEO BRONCHOSCOPY  WITH ENDOBRONCHIAL ULTRASOUND N/A 10/31/2017   Procedure: VIDEO BRONCHOSCOPY WITH ENDOBRONCHIAL ULTRASOUND;  Surgeon: Ivin Poot, MD;  Location: [place];  Service: Thoracic;  Laterality: N/A;  . VIDEO BRONCHOSCOPY WITH ENDOBRONCHIAL ULTRASOUND N/A 11/22/2017   Procedure: VIDEO BRONCHOSCOPY WITH ENDOBRONCHIAL ULTRASOUND;  Surgeon: Melrose Nakayama, MD;  Location: [place];  Service: Thoracic;  Laterality: N/A;     SOCIAL HISTORY:  Social History   Socioeconomic History  . Marital status: Married    Spouse name: Not on file  . Number of children: Not on  file  . Years of education: Not on file  . Highest education level: Not on file  Occupational History  . Occupation: disability  Social Needs  . Financial resource strain: Not on file  . Food insecurity:    Worry: Not on file    Inability: Not on file  . Transportation needs:    Medical: No    Non-medical: No  Tobacco Use  . Smoking status: Former Smoker    Packs/day: 1.50    Years: 30.00    Pack years: 45.00    Types: Cigarettes    Last attempt to quit: 10/13/2017    Years since quitting: 0.6  . Smokeless tobacco: Never Used  Substance and Sexual Activity  . Alcohol use: No  . Drug use: No  . Sexual activity: Yes    Birth control/protection: None  Lifestyle  . Physical activity:    Days per week: Not on file    Minutes per session: Not on file  . Stress: Not on file  Relationships  . Social connections:    Talks on phone: Not on file    Gets together: Not on file    Attends religious service: Not on file    Active member of club or organization: Not on file    Attends meetings of clubs or organizations: Not on file    Relationship status: Not on file  . Intimate partner violence:    Fear of current or ex partner: Not on file    Emotionally abused: Not on file    Physically abused: Not on file    Forced sexual activity: Not on file  Other Topics Concern  . Not on file  Social History Narrative   06-20-18 Unable to ask abuse questions wife with him today.    FAMILY HISTORY:  Family History  Problem Relation Age of Onset  . Hypertension Father   . Heart attack Father   . Kidney failure Mother   . Diabetes Mother   . Hypertension Mother   . Colon cancer Neg Hx   . Anesthesia problems Neg Hx   . Hypotension Neg Hx   . Malignant hyperthermia Neg Hx   . Pseudochol deficiency Neg Hx     CURRENT MEDICATIONS:  Outpatient Encounter Medications as of 06/22/2018  Medication Sig Note  . albuterol (PROVENTIL HFA;VENTOLIN HFA) 108 (90 BASE) MCG/ACT inhaler Inhale 2  puffs into the lungs every 6 (six) hours as needed for wheezing or shortness of breath.    Marland Kitchen albuterol (PROVENTIL) (2.5 MG/3ML) 0.083% nebulizer solution Take 2.5 mg by nebulization every 6 (six) hours as needed for wheezing or shortness of breath.    . alprazolam (XANAX) 2 MG tablet Take 2 mg by mouth every 6 (six) hours as needed for sleep or anxiety.    Marland Kitchen amLODipine (NORVASC) 10 MG tablet Take 10 mg by mouth daily.   Marland Kitchen CALCIUM-MAGNESIUM-ZINC PO Take 1,200 mg by mouth daily.   Marland Kitchen  COLLAGEN PO Take 7 g by mouth daily.   . cyclobenzaprine (FLEXERIL) 10 MG tablet Take 10 mg by mouth daily as needed for muscle spasms.    . diclofenac sodium (VOLTAREN) 1 % GEL Apply 2 g topically 4 (four) times daily. Apply to left forearm phlebitic area   . docusate sodium (COLACE) 100 MG capsule Take 1 capsule (100 mg total) by mouth every 12 (twelve) hours.   . Flaxseed, Linseed, (FLAXSEED OIL PO) Take 1 tablet by mouth 2 (two) times daily.    . fluticasone (FLONASE) 50 MCG/ACT nasal spray Place 2 sprays into both nostrils daily as needed for allergies.    . furosemide (LASIX) 20 MG tablet Take 10 mg by mouth daily.    . Glucosamine-Chondroit-Vit C-Mn (GLUCOSAMINE 1500 COMPLEX) CAPS Take 1 capsule by mouth.   . Ipilimumab (YERVOY IV) Inject into the vein.   Marland Kitchen levETIRAcetam (KEPPRA) 500 MG tablet Take 500 mg by mouth daily.    Marland Kitchen lidocaine (XYLOCAINE) 2 % solution Use as directed 15 mLs in the mouth or throat every 4 (four) hours as needed for mouth pain.    Marland Kitchen lidocaine-prilocaine (EMLA) cream APPLY TO PORTACATH SITE AS DIRECTED   . lisinopril (PRINIVIL,ZESTRIL) 20 MG tablet Take 20 mg by mouth 2 (two) times daily.   . meclizine (ANTIVERT) 12.5 MG tablet Take 1 tablet (12.5 mg total) by mouth 3 (three) times daily as needed for dizziness. (Patient not taking: Reported on 06/20/2018)   . MULTIPLE VITAMINS PO Take 1 tablet by mouth daily.    . mupirocin ointment (BACTROBAN) 2 % APPLY TO SKIN TID PRN   . naproxen  (NAPROSYN) 500 MG tablet Take 500 mg by mouth 2 (two) times daily with a meal.     . Nivolumab (OPDIVO IV) Inject into the vein.   Marland Kitchen oxyCODONE (ROXICODONE) 15 MG immediate release tablet Take 15 mg by mouth 4 (four) times daily as needed for pain.    . pantoprazole (PROTONIX) 40 MG tablet Take 40 mg by mouth at bedtime.    . polyethylene glycol (MIRALAX / GLYCOLAX) packet Take 17 g by mouth daily.   . pravastatin (PRAVACHOL) 20 MG tablet Take 20 mg by mouth daily.    . predniSONE (DELTASONE) 10 MG tablet Take 1 tablet (10 mg total) by mouth daily with breakfast. (Patient not taking: Reported on 06/20/2018) 06/20/2018: Stopped two weeks ago  . prochlorperazine (COMPAZINE) 10 MG tablet Take 1 tablet (10 mg total) by mouth every 6 (six) hours as needed for nausea or vomiting. (Patient not taking: Reported on 06/20/2018)   . tamsulosin (FLOMAX) 0.4 MG CAPS capsule Take 0.4 mg by mouth daily.    . temazepam (RESTORIL) 30 MG capsule TAKE 1 CAPSULE AT BEDTIME AS NEEDED FOR SLEEP (Patient not taking: Reported on 06/20/2018)   . triamcinolone cream (KENALOG) 0.1 % Apply 1 application topically 2 (two) times daily.    . TURMERIC PO Take 4 tablets by mouth daily.     No facility-administered encounter medications on file as of 06/22/2018.     ALLERGIES:  Allergies  Allergen Reactions  . Xgeva [Denosumab]     Rash   . Tecentriq [Atezolizumab] Rash     PHYSICAL EXAM:  ECOG Performance status: 1  Vitals:   06/22/18 0911  BP: 130/77  Pulse: 97  Resp: 20  Temp: 97.9 F (36.6 C)  SpO2: 99%   Filed Weights   06/22/18 0911  Weight: 194 lb 4.8 oz (88.1 kg)  Physical Exam  Constitutional: He is oriented to person, place, and time. He appears well-developed and well-nourished.  Cardiovascular: Normal rate, regular rhythm and normal heart sounds.  Pulmonary/Chest: Effort normal and breath sounds normal.  Musculoskeletal: Normal range of motion.  Neurological: He is alert and oriented to person,  place, and time.  Skin: Skin is warm and dry.  Psychiatric: He has a normal mood and affect. His behavior is normal. Judgment and thought content normal.     LABORATORY DATA:  I have reviewed the labs as listed.  CBC    Component Value Date/Time   WBC 5.3 06/22/2018 0848   RBC 4.43 06/22/2018 0848   HGB 12.9 (L) 06/22/2018 0848   HCT 40.7 06/22/2018 0848   PLT 277 06/22/2018 0848   MCV 91.9 06/22/2018 0848   MCH 29.1 06/22/2018 0848   MCHC 31.7 06/22/2018 0848   RDW 13.2 06/22/2018 0848   LYMPHSABS 1.2 06/22/2018 0848   MONOABS 0.6 06/22/2018 0848   EOSABS 0.3 06/22/2018 0848   BASOSABS 0.1 06/22/2018 0848   CMP Latest Ref Rng & Units 06/22/2018 06/04/2018 05/25/2018  Glucose 70 - 99 mg/dL 125(H) 133(H) 171(H)  BUN 6 - 20 mg/dL 13 18 17   Creatinine 0.61 - 1.24 mg/dL 0.92 1.08 1.27(H)  Sodium 135 - 145 mmol/L 138 138 138  Potassium 3.5 - 5.1 mmol/L 3.8 4.3 3.5  Chloride 98 - 111 mmol/L 108 102 103  CO2 22 - 32 mmol/L 21(L) 25 24  Calcium 8.9 - 10.3 mg/dL 8.7(L) 9.5 9.4  Total Protein 6.5 - 8.1 g/dL 7.3 7.3 7.3  Total Bilirubin 0.3 - 1.2 mg/dL 0.7 0.7 0.7  Alkaline Phos 38 - 126 U/L 54 65 61  AST 15 - 41 U/L 19 22 22   ALT 0 - 44 U/L 16 17 21        DIAGNOSTIC IMAGING:  I have reviewed CT scan from 06/04/2018 with the patient.     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  Extensive stage small cell lung cancer: -4 cycles of carboplatin, etoposide and Atezolizumab from 11/27/2017 through 01/29/2018 -CT scan of the chest, abdomen after 3 cycles of chemo immunotherapy on 01/25/2018 shows near complete resolution of the adenopathy and lung mass.  Sclerosis and bone mets noted.  - Maintenance Atezolizumab started on 02/28/2018, second dose on 03/22/2018, each time resulting in severe urticarial rash on the upper trunk and lower extremities, improved on steroid therapy. -He has gotten severe urticarial rash from Atezolizumab.  I have  discussed the results of the CT scan of the chest, abdomen and pelvis dated 04/06/2018 which showed no evidence of disease progression.  Previously measured mediastinal lymph nodes are barely detectable.  Dominant left hilar lymph node measures 2.1 x 0.9 cm.  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  Mild T11 compression fracture with less than 10% height loss was also seen. - I have sent him for a second opinion with Dr. Elmyra Ricks at [facility]. -He was evaluated by Dr. Berta Minor on 04/19/2018.  He was also referred to [facility] dermatology Dr. Royanne Foots. -It was suggested to give another trial of Atezolizumab.  He discontinued dexamethasone 4 mg daily and started taking prednisone 10 mg daily.  He was also prescribed triamcinolone 0.1% cream to be used twice daily. - He developed breathing difficulty with shortness of breath on exertion and on and off chest pain for the last 3 weeks.  He reportedly went  to the ER on 06/04/2018 with a did a CT scan of the chest PE protocol and CT of the abdomen and pelvis. - I have reviewed the images of the CT scan independently and discussed the results with the patient.  It showed progression of the left hilar adenopathy compared to the prior exam from 2 months ago.  This currently measures about 2.3 x 4 cm in size.  Subcarinal lymph node also increased in size compared to prior exam.  No new areas were seen. - Atezolizumab discontinued after last dose on 05/18/2018 secondary to progression. - He was evaluated by Dr. Elmyra Ricks at [facility].  Combination immunotherapy with ipilimumab and nivolumab was recommended, as responses with chemotherapy is very low as his cancer has progressed within 3 months of platinum induction regimen. - Today he will proceed with his cycle 1 of ipilimumab and nivolumab.  We talked about the side effects of combination immunotherapy in detail.  We plan to repeat scans after 2-3 cycles. -He  was also evaluated by Dr. Lisbeth Renshaw for palliative radiation to the hip and spine for better pain control.  This will be started on Monday.  2. Bone metastasis: - He will continue receiving Zometa every 4 weeks.  3.  Skin rash: - He had severe skin rash (lichenoid dermatitis) on the trunk and upper thighs after each dose of Atezolizumab.  This responded very well to steroids. -She was evaluated by Dr. Koleen Distance [facility].  She prescribed him triamcinolone 0.1% cream to be applied twice daily. -She recommended to give another trial of Atezolizumab.  He will continue prednisone 10 mg daily.  He will continue to use the triamcinolone cream.  She would like to see him 4 to 5 days after Atezolizumab dose.  Hence we have made a follow-up visit with her on 05/03/2018.  He gets diffuse rash typically 7 days after each dose of Atezolizumab. - She would consider starting him on [place].  Cyclosporine at a dose of 1.5 mg to 2.5 mg per kilogram twice a day is also an option. -He did not get any rash after his last doses on 04/27/2018 and 05/18/2018.      Orders placed this encounter:  Orders Placed This Encounter  Procedures  . MR Brain W Wo Contrast  . Lactate dehydrogenase  . TSH  . CBC with Differential/Platelet  . Comprehensive metabolic panel      Derek Jack, MD [place] [phone]

## 2018-06-22 NOTE — Patient Instructions (Signed)
[facility] Discharge Instructions     Thank you for choosing  Cancer Center at [facility] to provide your oncology and hematology care.  To afford each patient quality time with our provider, please arrive at least 15 minutes before your scheduled appointment time.   If you have a lab appointment with the Cancer Center please come in thru the  Main Entrance and check in at the main information desk  You need to re-schedule your appointment should you arrive 10 or more minutes late.  We strive to give you quality time with our providers, and arriving late affects you and other patients whose appointments are after yours.  Also, if you no show three or more times for appointments you may be dismissed from the clinic at the providers discretion.     Again, thank you for choosing [facility].  Our hope is that these requests will decrease the amount of time that you wait before being seen by our physicians.       _____________________________________________________________  Should you have questions after your visit to [facility], please contact our office at [phone] between the hours of 8:00 a.m. and 4:30 p.m.  Voicemails left after 4:00 p.m. will not be returned until the following business day.  For prescription refill requests, have your pharmacy contact our office and allow 72 hours.    Cancer Center Support Programs:   > Cancer Support Group  2nd Tuesday of the month 1pm-2pm, Journey Room    

## 2018-06-22 NOTE — Progress Notes (Signed)
Note created in error.

## 2018-06-25 ENCOUNTER — Telehealth (HOSPITAL_COMMUNITY): Payer: Self-pay | Admitting: *Deleted

## 2018-06-25 NOTE — Telephone Encounter (Signed)
24 hour call back. Patient reports nausea with no vomiting on Saturday and feeling very fatigued. He states his nausea medications helped and he was able to go out to eat Sunday and work today. Encouraged pt to continue nausea medications as needed and drink plenty of fluids. Pt instructed to call back if he starts vomiting and is unable to keep anything down. Verbalizes understanding.

## 2018-06-26 ENCOUNTER — Ambulatory Visit
Admission: RE | Admit: 2018-06-26 | Discharge: 2018-06-26 | Disposition: A | Discharge status: Home / Self Care | Payer: Medicare HMO | Source: Ambulatory Visit | Attending: Radiation Oncology | Admitting: Radiation Oncology

## 2018-06-26 DIAGNOSIS — C7931 Secondary malignant neoplasm of brain: Secondary | ICD-10-CM | POA: Diagnosis not present

## 2018-06-26 DIAGNOSIS — C342 Malignant neoplasm of middle lobe, bronchus or lung: Secondary | ICD-10-CM | POA: Diagnosis not present

## 2018-06-26 DIAGNOSIS — Z510 Encounter for antineoplastic radiation therapy: Secondary | ICD-10-CM | POA: Diagnosis not present

## 2018-06-26 DIAGNOSIS — C7951 Secondary malignant neoplasm of bone: Secondary | ICD-10-CM | POA: Diagnosis not present

## 2018-06-27 ENCOUNTER — Ambulatory Visit
Admission: RE | Admit: 2018-06-27 | Discharge: 2018-06-27 | Disposition: A | Discharge status: Home / Self Care | Payer: Medicare HMO | Source: Ambulatory Visit | Attending: Radiation Oncology | Admitting: Radiation Oncology

## 2018-06-27 DIAGNOSIS — C7931 Secondary malignant neoplasm of brain: Secondary | ICD-10-CM | POA: Diagnosis not present

## 2018-06-27 DIAGNOSIS — G4733 Obstructive sleep apnea (adult) (pediatric): Secondary | ICD-10-CM | POA: Diagnosis not present

## 2018-06-27 DIAGNOSIS — C342 Malignant neoplasm of middle lobe, bronchus or lung: Secondary | ICD-10-CM | POA: Diagnosis not present

## 2018-06-27 DIAGNOSIS — Z510 Encounter for antineoplastic radiation therapy: Secondary | ICD-10-CM | POA: Diagnosis not present

## 2018-06-27 DIAGNOSIS — C7951 Secondary malignant neoplasm of bone: Secondary | ICD-10-CM | POA: Diagnosis not present

## 2018-06-28 ENCOUNTER — Ambulatory Visit (HOSPITAL_COMMUNITY)
Admission: RE | Admit: 2018-06-28 | Discharge: 2018-06-28 | Disposition: A | Discharge status: Home / Self Care | Payer: Medicare HMO | Source: Ambulatory Visit | Attending: Radiation Oncology | Admitting: Radiation Oncology

## 2018-06-28 ENCOUNTER — Ambulatory Visit
Admission: RE | Admit: 2018-06-28 | Discharge: 2018-06-28 | Disposition: A | Discharge status: Home / Self Care | Payer: Medicare HMO | Source: Ambulatory Visit | Attending: Radiation Oncology | Admitting: Radiation Oncology

## 2018-06-28 ENCOUNTER — Telehealth: Payer: Self-pay | Admitting: Radiation Oncology

## 2018-06-28 DIAGNOSIS — C3490 Malignant neoplasm of unspecified part of unspecified bronchus or lung: Secondary | ICD-10-CM | POA: Diagnosis not present

## 2018-06-28 DIAGNOSIS — G9389 Other specified disorders of brain: Secondary | ICD-10-CM | POA: Diagnosis not present

## 2018-06-28 DIAGNOSIS — C7931 Secondary malignant neoplasm of brain: Secondary | ICD-10-CM | POA: Diagnosis not present

## 2018-06-28 DIAGNOSIS — Z510 Encounter for antineoplastic radiation therapy: Secondary | ICD-10-CM | POA: Diagnosis not present

## 2018-06-28 DIAGNOSIS — C7951 Secondary malignant neoplasm of bone: Secondary | ICD-10-CM | POA: Diagnosis not present

## 2018-06-28 DIAGNOSIS — C342 Malignant neoplasm of middle lobe, bronchus or lung: Secondary | ICD-10-CM | POA: Diagnosis not present

## 2018-06-28 MED ORDER — GADOBUTROL 1 MMOL/ML IV SOLN
10.0000 mL | Freq: Once | INTRAVENOUS | Status: AC | PRN
  Administered 2018-06-28: 10 mL via INTRAVENOUS

## 2018-06-28 MED ORDER — GADOBENATE DIMEGLUMINE 529 MG/ML IV SOLN: 20.0000 mL | Freq: Once | INTRAVENOUS | Status: DC | PRN

## 2018-06-28 NOTE — Telephone Encounter (Signed)
I spoke with the patient today to review the findings from his brain MRI. Dr. Lisbeth Renshaw would recommend a course of whole brain radiotherapy. We discussed options for treatment and he is going to consider this and make a decision tomorrow. We will coordinate for simulation and to reschedule his treatment session to accommodate for planning if he wanted to start treatment to the brain on Monday.

## 2018-06-29 ENCOUNTER — Ambulatory Visit
Admission: RE | Admit: 2018-06-29 | Discharge: 2018-06-29 | Disposition: A | Discharge status: Home / Self Care | Payer: Medicare HMO | Source: Ambulatory Visit | Attending: Radiation Oncology | Admitting: Radiation Oncology

## 2018-06-29 DIAGNOSIS — C7951 Secondary malignant neoplasm of bone: Secondary | ICD-10-CM | POA: Diagnosis not present

## 2018-06-29 DIAGNOSIS — C7931 Secondary malignant neoplasm of brain: Secondary | ICD-10-CM | POA: Diagnosis not present

## 2018-06-29 DIAGNOSIS — Z510 Encounter for antineoplastic radiation therapy: Secondary | ICD-10-CM | POA: Diagnosis not present

## 2018-06-29 DIAGNOSIS — C342 Malignant neoplasm of middle lobe, bronchus or lung: Secondary | ICD-10-CM | POA: Diagnosis not present

## 2018-07-02 ENCOUNTER — Emergency Department (HOSPITAL_COMMUNITY)
Admission: EM | Admit: 2018-07-02 | Discharge: 2018-07-03 | Disposition: A | Discharge status: Home / Self Care | Payer: Medicare HMO | Attending: Emergency Medicine | Admitting: Emergency Medicine

## 2018-07-02 ENCOUNTER — Encounter (HOSPITAL_COMMUNITY): Payer: Self-pay | Admitting: *Deleted

## 2018-07-02 ENCOUNTER — Ambulatory Visit
Admission: RE | Admit: 2018-07-02 | Discharge: 2018-07-02 | Disposition: A | Discharge status: Home / Self Care | Payer: Medicare HMO | Source: Ambulatory Visit | Attending: Radiation Oncology | Admitting: Radiation Oncology

## 2018-07-02 ENCOUNTER — Other Ambulatory Visit: Payer: Self-pay

## 2018-07-02 DIAGNOSIS — J449 Chronic obstructive pulmonary disease, unspecified: Secondary | ICD-10-CM | POA: Diagnosis not present

## 2018-07-02 DIAGNOSIS — C7951 Secondary malignant neoplasm of bone: Secondary | ICD-10-CM | POA: Diagnosis not present

## 2018-07-02 DIAGNOSIS — R112 Nausea with vomiting, unspecified: Secondary | ICD-10-CM | POA: Diagnosis not present

## 2018-07-02 DIAGNOSIS — E119 Type 2 diabetes mellitus without complications: Secondary | ICD-10-CM | POA: Diagnosis not present

## 2018-07-02 DIAGNOSIS — R69 Illness, unspecified: Secondary | ICD-10-CM | POA: Diagnosis not present

## 2018-07-02 DIAGNOSIS — Z85118 Personal history of other malignant neoplasm of bronchus and lung: Secondary | ICD-10-CM | POA: Insufficient documentation

## 2018-07-02 DIAGNOSIS — J45909 Unspecified asthma, uncomplicated: Secondary | ICD-10-CM | POA: Insufficient documentation

## 2018-07-02 DIAGNOSIS — Z8673 Personal history of transient ischemic attack (TIA), and cerebral infarction without residual deficits: Secondary | ICD-10-CM | POA: Insufficient documentation

## 2018-07-02 DIAGNOSIS — R509 Fever, unspecified: Secondary | ICD-10-CM

## 2018-07-02 DIAGNOSIS — Z9049 Acquired absence of other specified parts of digestive tract: Secondary | ICD-10-CM | POA: Insufficient documentation

## 2018-07-02 DIAGNOSIS — R110 Nausea: Secondary | ICD-10-CM | POA: Diagnosis not present

## 2018-07-02 DIAGNOSIS — R197 Diarrhea, unspecified: Secondary | ICD-10-CM | POA: Diagnosis not present

## 2018-07-02 DIAGNOSIS — Z87891 Personal history of nicotine dependence: Secondary | ICD-10-CM | POA: Diagnosis not present

## 2018-07-02 DIAGNOSIS — F419 Anxiety disorder, unspecified: Secondary | ICD-10-CM | POA: Diagnosis not present

## 2018-07-02 DIAGNOSIS — R109 Unspecified abdominal pain: Secondary | ICD-10-CM | POA: Diagnosis not present

## 2018-07-02 DIAGNOSIS — R05 Cough: Secondary | ICD-10-CM | POA: Diagnosis not present

## 2018-07-02 DIAGNOSIS — I10 Essential (primary) hypertension: Secondary | ICD-10-CM | POA: Diagnosis not present

## 2018-07-02 DIAGNOSIS — C7931 Secondary malignant neoplasm of brain: Secondary | ICD-10-CM | POA: Diagnosis not present

## 2018-07-02 DIAGNOSIS — Z79899 Other long term (current) drug therapy: Secondary | ICD-10-CM | POA: Insufficient documentation

## 2018-07-02 DIAGNOSIS — Z510 Encounter for antineoplastic radiation therapy: Secondary | ICD-10-CM | POA: Diagnosis not present

## 2018-07-02 DIAGNOSIS — C342 Malignant neoplasm of middle lobe, bronchus or lung: Secondary | ICD-10-CM | POA: Diagnosis not present

## 2018-07-02 NOTE — ED Triage Notes (Signed)
Pt states he had radiation today and started running a fever tonight with chills and a headache and a cough; pt temp at home was 100.8 and took 2 tylenol; pt states just pta temp went up to 101.1; pt has been having bouts of nausea for the last couple of days

## 2018-07-03 ENCOUNTER — Ambulatory Visit
Admission: RE | Admit: 2018-07-03 | Discharge: 2018-07-03 | Disposition: A | Discharge status: Home / Self Care | Payer: Medicare HMO | Source: Ambulatory Visit | Attending: Radiation Oncology | Admitting: Radiation Oncology

## 2018-07-03 ENCOUNTER — Emergency Department (HOSPITAL_COMMUNITY): Payer: Medicare HMO

## 2018-07-03 ENCOUNTER — Ambulatory Visit: Payer: Medicare HMO

## 2018-07-03 DIAGNOSIS — R509 Fever, unspecified: Secondary | ICD-10-CM | POA: Diagnosis not present

## 2018-07-03 DIAGNOSIS — Z8673 Personal history of transient ischemic attack (TIA), and cerebral infarction without residual deficits: Secondary | ICD-10-CM | POA: Diagnosis not present

## 2018-07-03 DIAGNOSIS — C342 Malignant neoplasm of middle lobe, bronchus or lung: Secondary | ICD-10-CM | POA: Diagnosis not present

## 2018-07-03 DIAGNOSIS — J45909 Unspecified asthma, uncomplicated: Secondary | ICD-10-CM | POA: Diagnosis not present

## 2018-07-03 DIAGNOSIS — Z510 Encounter for antineoplastic radiation therapy: Secondary | ICD-10-CM | POA: Diagnosis not present

## 2018-07-03 DIAGNOSIS — I10 Essential (primary) hypertension: Secondary | ICD-10-CM | POA: Diagnosis not present

## 2018-07-03 DIAGNOSIS — C7951 Secondary malignant neoplasm of bone: Secondary | ICD-10-CM | POA: Diagnosis not present

## 2018-07-03 DIAGNOSIS — E119 Type 2 diabetes mellitus without complications: Secondary | ICD-10-CM | POA: Diagnosis not present

## 2018-07-03 DIAGNOSIS — R69 Illness, unspecified: Secondary | ICD-10-CM | POA: Diagnosis not present

## 2018-07-03 DIAGNOSIS — R110 Nausea: Secondary | ICD-10-CM | POA: Diagnosis not present

## 2018-07-03 DIAGNOSIS — J449 Chronic obstructive pulmonary disease, unspecified: Secondary | ICD-10-CM | POA: Diagnosis not present

## 2018-07-03 DIAGNOSIS — Z79899 Other long term (current) drug therapy: Secondary | ICD-10-CM | POA: Diagnosis not present

## 2018-07-03 DIAGNOSIS — C7931 Secondary malignant neoplasm of brain: Secondary | ICD-10-CM | POA: Diagnosis not present

## 2018-07-03 DIAGNOSIS — R05 Cough: Secondary | ICD-10-CM | POA: Diagnosis not present

## 2018-07-03 DIAGNOSIS — Z85118 Personal history of other malignant neoplasm of bronchus and lung: Secondary | ICD-10-CM | POA: Diagnosis not present

## 2018-07-03 LAB — CBC WITH DIFFERENTIAL/PLATELET
ABS IMMATURE GRANULOCYTES: 0.04 10*3/uL (ref 0.00–0.07)
BASOS ABS: 0 10*3/uL (ref 0.0–0.1)
BASOS PCT: 1 %
Eosinophils Absolute: 0.3 10*3/uL (ref 0.0–0.5)
Eosinophils Relative: 6 %
HCT: 36.3 % — ABNORMAL LOW (ref 39.0–52.0)
Hemoglobin: 11.5 g/dL — ABNORMAL LOW (ref 13.0–17.0)
IMMATURE GRANULOCYTES: 1 %
Lymphocytes Relative: 13 %
Lymphs Abs: 0.5 10*3/uL — ABNORMAL LOW (ref 0.7–4.0)
MCH: 29.3 pg (ref 26.0–34.0)
MCHC: 31.7 g/dL (ref 30.0–36.0)
MCV: 92.4 fL (ref 80.0–100.0)
MONOS PCT: 11 %
Monocytes Absolute: 0.5 10*3/uL (ref 0.1–1.0)
NEUTROS ABS: 2.8 10*3/uL (ref 1.7–7.7)
NEUTROS PCT: 68 %
PLATELETS: 229 10*3/uL (ref 150–400)
RBC: 3.93 MIL/uL — ABNORMAL LOW (ref 4.22–5.81)
RDW: 13.6 % (ref 11.5–15.5)
WBC: 4.1 10*3/uL (ref 4.0–10.5)
nRBC: 0 % (ref 0.0–0.2)

## 2018-07-03 LAB — URINALYSIS, ROUTINE W REFLEX MICROSCOPIC
Bilirubin Urine: NEGATIVE
GLUCOSE, UA: NEGATIVE mg/dL
HGB URINE DIPSTICK: NEGATIVE
Ketones, ur: NEGATIVE mg/dL
LEUKOCYTES UA: NEGATIVE
Nitrite: NEGATIVE
Protein, ur: NEGATIVE mg/dL
SPECIFIC GRAVITY, URINE: 1.026 (ref 1.005–1.030)
pH: 5 (ref 5.0–8.0)

## 2018-07-03 LAB — COMPREHENSIVE METABOLIC PANEL
ALBUMIN: 3.5 g/dL (ref 3.5–5.0)
ALT: 16 U/L (ref 0–44)
AST: 20 U/L (ref 15–41)
Alkaline Phosphatase: 49 U/L (ref 38–126)
Anion gap: 6 (ref 5–15)
BUN: 10 mg/dL (ref 6–20)
CHLORIDE: 109 mmol/L (ref 98–111)
CO2: 22 mmol/L (ref 22–32)
CREATININE: 0.81 mg/dL (ref 0.61–1.24)
Calcium: 8.2 mg/dL — ABNORMAL LOW (ref 8.9–10.3)
GFR calc Af Amer: >60 mL/min (ref >60)
GFR calc non Af Amer: >60 mL/min (ref >60)
GLUCOSE: 107 mg/dL — AB (ref 70–99)
Potassium: 3.5 mmol/L (ref 3.5–5.1)
Sodium: 137 mmol/L (ref 135–145)
Total Bilirubin: 0.6 mg/dL (ref 0.3–1.2)
Total Protein: 6.4 g/dL — ABNORMAL LOW (ref 6.5–8.1)

## 2018-07-03 LAB — LIPASE, BLOOD: Lipase: 30 U/L (ref 11–51)

## 2018-07-03 LAB — INFLUENZA PANEL BY PCR (TYPE A & B)
Influenza A By PCR: NEGATIVE
Influenza B By PCR: NEGATIVE

## 2018-07-03 MED ORDER — SODIUM CHLORIDE 0.9 % IV BOLUS
1000.0000 mL | Freq: Once | INTRAVENOUS | Status: AC
  Administered 2018-07-03: 1000 mL via INTRAVENOUS

## 2018-07-03 MED ORDER — ONDANSETRON HCL 4 MG/2ML IJ SOLN
4.0000 mg | Freq: Once | INTRAMUSCULAR | Status: AC
  Administered 2018-07-03: 4 mg via INTRAVENOUS
  Filled 2018-07-03: qty 2

## 2018-07-03 MED ORDER — PROMETHAZINE HCL 25 MG/ML IJ SOLN
25.0000 mg | Freq: Once | INTRAMUSCULAR | Status: AC
  Administered 2018-07-03: 25 mg via INTRAVENOUS
  Filled 2018-07-03: qty 1

## 2018-07-03 MED ORDER — HEPARIN SOD (PORK) LOCK FLUSH 100 UNIT/ML IV SOLN
INTRAVENOUS | Status: AC
  Filled 2018-07-03: qty 5

## 2018-07-03 NOTE — Discharge Instructions (Addendum)
You were seen today for fever and nausea.  Your work-up is largely reassuring.  Follow-up closely with your oncologist.  Take your prescription nausea medications at home.  Some of your symptoms may be related to ongoing treatment.

## 2018-07-03 NOTE — ED Provider Notes (Signed)
[facility] EMERGENCY DEPARTMENT Provider Note   CSN: [id number] Arrival date & time: 07/02/18  2255     History   Chief Complaint Chief Complaint  Patient presents with  . Fever    HPI [name] [name] is a 57 y.o. male.  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  Patient reports he had his fifth session of radiation today.  He is receiving radiation to the abdomen as well as recently started radiation to the brain.  His first session for radiation to the brain was today.  He states over the last 2 to 3 days he is generally not felt well.  He has had some nausea and vomiting as well as diarrhea.  She reports that he has Zofran and Compazine at home but has not tried them.  He has been using ginger.  He states "I take enough medications."  Reports that this is nonbloody.  Today he noted a temperature of 100.4 at home.  He took 2 Tylenol.  Repeat temperature was 101.1.  He reports cough.  He denies any other infectious symptoms including urinary symptoms.  He has chronic back pain.  He does report since starting radiation that he has had some lower abdominal discomfort.  This is unchanged and not new.  Reviewed the patient's chart.  Most recent CT scans from September 23 showed some mild regression and disease.  He had an MRI on October 17 which showed 5 new likely metastatic lesions.  Past Medical History:  Diagnosis Date  . Anxiety   . Asthma   . COPD (chronic obstructive pulmonary disease) ([place])   . DDD (degenerative disc disease)   . Diverticulitis   . GERD (gastroesophageal reflux disease)   . Headache   . Hepatitis    in the late 70's   . HTN (hypertension)   . Mediastinal adenopathy   . Seizures ([place])    as of 2019 none for 20 years  . Sleep apnea    does not  use cpap  . Stroke [facility])    TIA - Feb. 2018    Patient Active Problem  List   Diagnosis Date Noted  . Bone metastasis ([place]) 01/29/2018  . Malignant neoplasm of bronchus and lung ([place])   . 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 Goals of care, counseling/discussion 11/23/2017  . Hyperlipidemia 01/03/2017  . Family history of heart disease 01/03/2017  . Blurry vision 10/26/2016  . Slurred speech 10/26/2016  . Cerebrovascular disease 10/26/2016  . Chest pain 05/10/2012  . HTN (hypertension) 05/10/2012  . Chronic pain 05/10/2012  . Diabetes mellitus ([place]) 05/10/2012  . Screening for colon cancer 07/25/2011    Past Surgical History:  Procedure Laterality Date  . CERVICAL DISC SURGERY     X4  . CHOLECYSTECTOMY    . COLONOSCOPY  08/24/2011   Procedure: COLONOSCOPY;  Surgeon: Daneil Dolin, MD;  Location: [facility];  Service: Endoscopy;  Laterality: N/A;  7:30AM  . ELBOW SURGERY     right  . PORTACATH PLACEMENT Right 12/01/2017   Procedure: INSERTION PORT-A-CATH;  Surgeon: Aviva Signs, MD;  Location: [facility] ORS;  Service: General;  Laterality: Right;  Marland Kitchen VIDEO BRONCHOSCOPY WITH ENDOBRONCHIAL ULTRASOUND N/A 10/31/2017   Procedure: VIDEO BRONCHOSCOPY WITH ENDOBRONCHIAL ULTRASOUND;  Surgeon: Ivin Poot, MD;  Location: [place];  Service: Thoracic;  Laterality:  N/A;  . VIDEO BRONCHOSCOPY WITH ENDOBRONCHIAL ULTRASOUND N/A 11/22/2017   Procedure: VIDEO BRONCHOSCOPY WITH ENDOBRONCHIAL ULTRASOUND;  Surgeon: Melrose Nakayama, MD;  Location: [facility] OR;  Service: Thoracic;  Laterality: N/A;        Home Medications    Prior to Admission medications   Medication Sig Start Date End Date Taking? Authorizing Provider  albuterol (PROVENTIL HFA;VENTOLIN HFA) 108 (90 BASE) MCG/ACT inhaler Inhale 2 puffs into the lungs every 6 (six) hours as needed for wheezing or shortness of breath.     [provider]  albuterol (PROVENTIL) (2.5 MG/3ML) 0.083% nebulizer solution Take 2.5 mg by nebulization every 6 (six) hours as needed for  wheezing or shortness of breath.     [provider]  alprazolam Duanne Moron) 2 MG tablet Take 2 mg by mouth every 6 (six) hours as needed for sleep or anxiety.     [provider]  amLODipine (NORVASC) 10 MG tablet Take 10 mg by mouth daily.    [provider]  CALCIUM-MAGNESIUM-ZINC PO Take 1,200 mg by mouth daily.    [provider]  COLLAGEN PO Take 7 g by mouth daily.    [provider]  cyclobenzaprine (FLEXERIL) 10 MG tablet Take 10 mg by mouth daily as needed for muscle spasms.  07/11/16   [provider]  diclofenac sodium (VOLTAREN) 1 % GEL Apply 2 g topically 4 (four) times daily. Apply to left forearm phlebitic area 01/08/18   Derek Jack, MD  docusate sodium (COLACE) 100 MG capsule Take 1 capsule (100 mg total) by mouth every 12 (twelve) hours. 11/27/17   Mesner, Corene Cornea, MD  Flaxseed, Linseed, (FLAXSEED OIL PO) Take 1 tablet by mouth 2 (two) times daily.     [provider]  fluticasone (FLONASE) 50 MCG/ACT nasal spray Place 2 sprays into both nostrils daily as needed for allergies.  07/11/16   [provider]  furosemide (LASIX) 20 MG tablet Take 10 mg by mouth daily.  04/10/18   [provider]  Glucosamine-Chondroit-Vit C-Mn (GLUCOSAMINE 1500 COMPLEX) CAPS Take 1 capsule by mouth.    [provider]  Ipilimumab (YERVOY IV) Inject into the vein.    [provider]  levETIRAcetam (KEPPRA) 500 MG tablet Take 500 mg by mouth daily.  11/29/16   [provider]  lidocaine (XYLOCAINE) 2 % solution Use as directed 15 mLs in the mouth or throat every 4 (four) hours as needed for mouth pain.  01/24/18   [provider]  lidocaine-prilocaine (EMLA) cream APPLY TO PORTACATH SITE AS DIRECTED 01/22/18   Derek Jack, MD  lisinopril (PRINIVIL,ZESTRIL) 20 MG tablet Take 20 mg by mouth 2 (two) times daily.    [provider]  meclizine (ANTIVERT) 12.5 MG tablet Take 1  tablet (12.5 mg total) by mouth 3 (three) times daily as needed for dizziness. Patient not taking: Reported on 06/20/2018 10/28/16   Florencia Reasons, MD  MULTIPLE VITAMINS PO Take 1 tablet by mouth daily.     [provider]  mupirocin ointment (BACTROBAN) 2 % APPLY TO SKIN TID PRN 02/15/18   [provider]  naproxen (NAPROSYN) 500 MG tablet Take 500 mg by mouth 2 (two) times daily with a meal.      [provider]  Nivolumab (OPDIVO IV) Inject into the vein.    [provider]  oxyCODONE (ROXICODONE) 15 MG immediate release tablet Take 15 mg by mouth 4 (four) times daily as needed for pain.  [provider]  pantoprazole (PROTONIX) 40 MG tablet Take 40 mg by mouth at bedtime.  02/09/18   [provider]  polyethylene glycol (MIRALAX / GLYCOLAX) packet Take 17 g by mouth daily. 11/27/17   Mesner, Corene Cornea, MD  pravastatin (PRAVACHOL) 20 MG tablet Take 20 mg by mouth daily.  02/09/18   [provider]  predniSONE (DELTASONE) 10 MG tablet Take 1 tablet (10 mg total) by mouth daily with breakfast. Patient not taking: Reported on 06/20/2018 04/27/18   Francene Finders L, NP-C  prochlorperazine (COMPAZINE) 10 MG tablet Take 1 tablet (10 mg total) by mouth every 6 (six) hours as needed for nausea or vomiting. Patient not taking: Reported on 06/20/2018 06/15/18   Derek Jack, MD  tamsulosin (FLOMAX) 0.4 MG CAPS capsule Take 0.4 mg by mouth daily.  07/11/16   [provider]  temazepam (RESTORIL) 30 MG capsule TAKE 1 CAPSULE AT BEDTIME AS NEEDED FOR SLEEP Patient not taking: Reported on 06/20/2018 06/07/18   Francene Finders L, NP-C  triamcinolone cream (KENALOG) 0.1 % Apply 1 application topically 2 (two) times daily.  04/23/18   [provider]  TURMERIC PO Take 4 tablets by mouth daily.     [provider]    Family History Family History  Problem Relation Age of Onset  . Hypertension Father   . Heart attack Father   .  Kidney failure Mother   . Diabetes Mother   . Hypertension Mother   . Colon cancer Neg Hx   . Anesthesia problems Neg Hx   . Hypotension Neg Hx   . Malignant hyperthermia Neg Hx   . Pseudochol deficiency Neg Hx     Social History Social History   Tobacco Use  . Smoking status: Former Smoker    Packs/day: 1.50    Years: 30.00    Pack years: 45.00    Types: Cigarettes    Last attempt to quit: 10/13/2017    Years since quitting: 0.7  . Smokeless tobacco: Never Used  Substance Use Topics  . Alcohol use: No  . Drug use: No     Allergies   Xgeva [denosumab] and Tecentriq [atezolizumab]   Review of Systems Review of Systems  Constitutional: Positive for chills and fever.  Respiratory: Positive for cough. Negative for shortness of breath.   Cardiovascular: Negative for chest pain.  Gastrointestinal: Positive for abdominal pain, diarrhea, nausea and vomiting.  Genitourinary: Negative for dysuria.  Musculoskeletal: Positive for back pain.  Neurological: Negative for weakness, numbness and headaches.  All other systems reviewed and are negative.    Physical Exam Updated Vital Signs BP 120/74   Pulse 66   Temp 98.9 F (37.2 C) (Oral)   Resp 18   Ht 1.702 m (5\' 7" )   Wt 86.2 kg   SpO2 98%   BMI 29.76 kg/m   Physical Exam  Constitutional: He is oriented to person, place, and time. He appears well-developed and well-nourished.  Nontoxic, no acute distress  HENT:  Head: Normocephalic and atraumatic.  Eyes: Pupils are equal, round, and reactive to light.  Neck: Neck supple.  Cardiovascular: Normal rate, regular rhythm and normal heart sounds.  No murmur heard. Pulmonary/Chest: Effort normal. No respiratory distress. He has no wheezes.  Coarse breath sounds bilaterally  Abdominal: Soft. Bowel sounds are normal. There is tenderness. There is no rebound.  Slight lower abdominal tenderness to palpation, no rebound or guarding noted  Musculoskeletal: He exhibits no  edema.  Lymphadenopathy:  He has no cervical adenopathy.  Neurological: He is alert and oriented to person, place, and time.  Skin: Skin is warm and dry.  Psychiatric: He has a normal mood and affect.  Nursing note and vitals reviewed.    ED Treatments / Results  Labs (all labs ordered are listed, but only abnormal results are displayed) Labs Reviewed  CBC WITH DIFFERENTIAL/PLATELET - Abnormal; Notable for the following components:      Result Value   RBC 3.93 (*)    Hemoglobin 11.5 (*)    HCT 36.3 (*)    Lymphs Abs 0.5 (*)    All other components within normal limits  COMPREHENSIVE METABOLIC PANEL - Abnormal; Notable for the following components:   Glucose, Bld 107 (*)    Calcium 8.2 (*)    Total Protein 6.4 (*)    All other components within normal limits  CULTURE, BLOOD (ROUTINE X 2)  CULTURE, BLOOD (ROUTINE X 2)  LIPASE, BLOOD  URINALYSIS, ROUTINE W REFLEX MICROSCOPIC  INFLUENZA PANEL BY PCR (TYPE A & B)    EKG None  Radiology Dg Chest 2 View  Result Date: 07/03/2018 CLINICAL DATA:  Cough, fever EXAM: CHEST - 2 VIEW COMPARISON:  05/25/2018.  Chest CT 06/04/2018. FINDINGS: Right Port-A-Cath remains in place, unchanged. Heart and mediastinal contours are within normal limits. Unable to visualize the previously seen left hilar and AP window adenopathy on prior CT. No focal opacities or effusions. No acute bony abnormality. IMPRESSION: No active cardiopulmonary disease. Electronically Signed   By: Rolm Baptise M.D.   On: 07/03/2018 01:19    Procedures Procedures (including critical care time)  Medications Ordered in ED Medications  heparin lock flush 100 UNIT/ML injection (has no administration in time range)  sodium chloride 0.9 % bolus 1,000 mL (0 mLs Intravenous Stopped 07/03/18 0230)  ondansetron (ZOFRAN) injection 4 mg (4 mg Intravenous Given 07/03/18 0044)  promethazine (PHENERGAN) injection 25 mg (25 mg Intravenous Given 07/03/18 0242)     Initial  Impression / Assessment and Plan / ED Course  I have reviewed the triage vital signs and the nursing notes.  Pertinent labs & imaging results that were available during my care of the patient were reviewed by me and considered in my medical decision making (see chart for details).     Patient presents with concern for fever at home and persistent nausea.  He is overall nontoxic-appearing and vital signs are reassuring.  Afebrile here although this is after Tylenol.  He does not appear septic.  Given known metastatic cancer, will obtain basic lab work.  Patient was given fluids and nausea medication.  He has some ongoing chronic pain which is unchanged from prior.  Lab work-up is largely reassuring.  No significant leukocytosis or leukopenia.  No significant metabolic derangements.  No evidence of pneumonia on chest x-ray.  No evidence of urinary tract infection.  Influenza screen is negative.  Suspect patient's nausea may be related to ongoing treatment.  Unclear etiology of fever; however, at this time there does not appear to be an acute emergent process.  Recommended to the patient that he trial Compazine or Zofran at home as nausea may be persistent without these.  He is able to orally hydrate prior to discharge.  He will follow-up closely with his oncologist.  Blood cultures are pending.  After history, exam, and medical workup I feel the patient has been appropriately medically screened and is safe for discharge home. Pertinent diagnoses were discussed with the patient.  Patient was given return precautions.   Final Clinical Impressions(s) / ED Diagnoses   Final diagnoses:  Fever, unspecified fever cause  Nausea    ED Discharge Orders    None       Anntonette Madewell, Barbette Hair, MD 07/03/18 [phone]

## 2018-07-04 ENCOUNTER — Ambulatory Visit
Admission: RE | Admit: 2018-07-04 | Discharge: 2018-07-04 | Disposition: A | Discharge status: Home / Self Care | Payer: Medicare HMO | Source: Ambulatory Visit | Attending: Radiation Oncology | Admitting: Radiation Oncology

## 2018-07-04 DIAGNOSIS — C342 Malignant neoplasm of middle lobe, bronchus or lung: Secondary | ICD-10-CM | POA: Diagnosis not present

## 2018-07-04 DIAGNOSIS — C7931 Secondary malignant neoplasm of brain: Secondary | ICD-10-CM | POA: Diagnosis not present

## 2018-07-04 DIAGNOSIS — C7951 Secondary malignant neoplasm of bone: Secondary | ICD-10-CM | POA: Diagnosis not present

## 2018-07-04 DIAGNOSIS — Z510 Encounter for antineoplastic radiation therapy: Secondary | ICD-10-CM | POA: Diagnosis not present

## 2018-07-05 ENCOUNTER — Ambulatory Visit
Admission: RE | Admit: 2018-07-05 | Discharge: 2018-07-05 | Disposition: A | Discharge status: Home / Self Care | Payer: Medicare HMO | Source: Ambulatory Visit | Attending: Radiation Oncology | Admitting: Radiation Oncology

## 2018-07-05 DIAGNOSIS — Z510 Encounter for antineoplastic radiation therapy: Secondary | ICD-10-CM | POA: Diagnosis not present

## 2018-07-05 DIAGNOSIS — C7951 Secondary malignant neoplasm of bone: Secondary | ICD-10-CM | POA: Diagnosis not present

## 2018-07-05 DIAGNOSIS — C7931 Secondary malignant neoplasm of brain: Secondary | ICD-10-CM | POA: Diagnosis not present

## 2018-07-05 DIAGNOSIS — C342 Malignant neoplasm of middle lobe, bronchus or lung: Secondary | ICD-10-CM | POA: Diagnosis not present

## 2018-07-06 ENCOUNTER — Ambulatory Visit
Admission: RE | Admit: 2018-07-06 | Discharge: 2018-07-06 | Disposition: A | Discharge status: Home / Self Care | Payer: Medicare HMO | Source: Ambulatory Visit | Attending: Radiation Oncology | Admitting: Radiation Oncology

## 2018-07-06 DIAGNOSIS — Z0001 Encounter for general adult medical examination with abnormal findings: Secondary | ICD-10-CM | POA: Diagnosis not present

## 2018-07-06 DIAGNOSIS — C342 Malignant neoplasm of middle lobe, bronchus or lung: Secondary | ICD-10-CM | POA: Diagnosis not present

## 2018-07-06 DIAGNOSIS — Z510 Encounter for antineoplastic radiation therapy: Secondary | ICD-10-CM | POA: Diagnosis not present

## 2018-07-06 DIAGNOSIS — J449 Chronic obstructive pulmonary disease, unspecified: Secondary | ICD-10-CM | POA: Diagnosis not present

## 2018-07-06 DIAGNOSIS — C7931 Secondary malignant neoplasm of brain: Secondary | ICD-10-CM | POA: Diagnosis not present

## 2018-07-06 DIAGNOSIS — H66004 Acute suppurative otitis media without spontaneous rupture of ear drum, recurrent, right ear: Secondary | ICD-10-CM | POA: Diagnosis not present

## 2018-07-06 DIAGNOSIS — I10 Essential (primary) hypertension: Secondary | ICD-10-CM | POA: Diagnosis not present

## 2018-07-06 DIAGNOSIS — C7951 Secondary malignant neoplasm of bone: Secondary | ICD-10-CM | POA: Diagnosis not present

## 2018-07-06 DIAGNOSIS — I700 Atherosclerosis of aorta: Secondary | ICD-10-CM | POA: Diagnosis not present

## 2018-07-06 DIAGNOSIS — E785 Hyperlipidemia, unspecified: Secondary | ICD-10-CM | POA: Diagnosis not present

## 2018-07-06 DIAGNOSIS — Z6828 Body mass index (BMI) 28.0-28.9, adult: Secondary | ICD-10-CM | POA: Diagnosis not present

## 2018-07-06 DIAGNOSIS — C3490 Malignant neoplasm of unspecified part of unspecified bronchus or lung: Secondary | ICD-10-CM | POA: Diagnosis not present

## 2018-07-06 DIAGNOSIS — G894 Chronic pain syndrome: Secondary | ICD-10-CM | POA: Diagnosis not present

## 2018-07-06 DIAGNOSIS — R69 Illness, unspecified: Secondary | ICD-10-CM | POA: Diagnosis not present

## 2018-07-07 ENCOUNTER — Other Ambulatory Visit (HOSPITAL_COMMUNITY): Payer: Self-pay | Admitting: Nurse Practitioner

## 2018-07-07 DIAGNOSIS — G4700 Insomnia, unspecified: Secondary | ICD-10-CM

## 2018-07-08 LAB — CULTURE, BLOOD (ROUTINE X 2)
CULTURE: NO GROWTH
Culture: NO GROWTH
Special Requests: ADEQUATE
Special Requests: ADEQUATE

## 2018-07-09 ENCOUNTER — Ambulatory Visit
Admission: RE | Admit: 2018-07-09 | Discharge: 2018-07-09 | Disposition: A | Discharge status: Home / Self Care | Payer: Medicare HMO | Source: Ambulatory Visit | Attending: Radiation Oncology | Admitting: Radiation Oncology

## 2018-07-09 DIAGNOSIS — C342 Malignant neoplasm of middle lobe, bronchus or lung: Secondary | ICD-10-CM | POA: Diagnosis not present

## 2018-07-09 DIAGNOSIS — C7931 Secondary malignant neoplasm of brain: Secondary | ICD-10-CM | POA: Diagnosis not present

## 2018-07-09 DIAGNOSIS — Z510 Encounter for antineoplastic radiation therapy: Secondary | ICD-10-CM | POA: Diagnosis not present

## 2018-07-09 DIAGNOSIS — C7951 Secondary malignant neoplasm of bone: Secondary | ICD-10-CM | POA: Diagnosis not present

## 2018-07-10 ENCOUNTER — Ambulatory Visit
Admission: RE | Admit: 2018-07-10 | Discharge: 2018-07-10 | Disposition: A | Discharge status: Home / Self Care | Payer: Medicare HMO | Source: Ambulatory Visit | Attending: Radiation Oncology | Admitting: Radiation Oncology

## 2018-07-10 DIAGNOSIS — Z510 Encounter for antineoplastic radiation therapy: Secondary | ICD-10-CM | POA: Diagnosis not present

## 2018-07-11 ENCOUNTER — Ambulatory Visit (HOSPITAL_COMMUNITY): Payer: Medicare HMO

## 2018-07-11 ENCOUNTER — Other Ambulatory Visit (HOSPITAL_COMMUNITY): Payer: Medicare HMO

## 2018-07-11 ENCOUNTER — Ambulatory Visit
Admission: RE | Admit: 2018-07-11 | Discharge: 2018-07-11 | Disposition: A | Discharge status: Home / Self Care | Payer: Medicare HMO | Source: Ambulatory Visit | Attending: Radiation Oncology | Admitting: Radiation Oncology

## 2018-07-11 DIAGNOSIS — Z510 Encounter for antineoplastic radiation therapy: Secondary | ICD-10-CM | POA: Diagnosis not present

## 2018-07-12 ENCOUNTER — Other Ambulatory Visit: Payer: Self-pay | Admitting: Radiation Oncology

## 2018-07-12 ENCOUNTER — Ambulatory Visit
Admission: RE | Admit: 2018-07-12 | Discharge: 2018-07-12 | Disposition: A | Discharge status: Home / Self Care | Payer: Medicare HMO | Source: Ambulatory Visit | Attending: Radiation Oncology | Admitting: Radiation Oncology

## 2018-07-12 DIAGNOSIS — Z510 Encounter for antineoplastic radiation therapy: Secondary | ICD-10-CM | POA: Diagnosis not present

## 2018-07-12 MED ORDER — SUCRALFATE 1 G PO TABS: 1.0000 g | ORAL_TABLET | Freq: Four times a day (QID) | ORAL | 2 refills | Status: AC

## 2018-07-13 ENCOUNTER — Inpatient Hospital Stay (HOSPITAL_COMMUNITY): Payer: Medicare HMO

## 2018-07-13 ENCOUNTER — Encounter (HOSPITAL_COMMUNITY): Payer: Self-pay | Admitting: Hematology

## 2018-07-13 ENCOUNTER — Other Ambulatory Visit: Payer: Self-pay

## 2018-07-13 ENCOUNTER — Inpatient Hospital Stay (HOSPITAL_COMMUNITY): Payer: Medicare HMO | Attending: Hematology

## 2018-07-13 ENCOUNTER — Ambulatory Visit: Payer: Medicare HMO

## 2018-07-13 ENCOUNTER — Encounter: Payer: Self-pay | Admitting: Radiation Oncology

## 2018-07-13 ENCOUNTER — Inpatient Hospital Stay (HOSPITAL_COMMUNITY): Payer: Medicare HMO | Attending: Hematology | Admitting: Hematology

## 2018-07-13 ENCOUNTER — Ambulatory Visit
Admission: RE | Admit: 2018-07-13 | Discharge: 2018-07-13 | Disposition: A | Discharge status: Home / Self Care | Payer: Medicare HMO | Source: Ambulatory Visit | Attending: Radiation Oncology | Admitting: Radiation Oncology

## 2018-07-13 VITALS — BP 115/74 | HR 90 | Temp 98.0°F | Resp 18 | Wt 184.5 lb

## 2018-07-13 VITALS — BP 121/61 | HR 81 | Temp 98.5°F | Resp 17

## 2018-07-13 DIAGNOSIS — J449 Chronic obstructive pulmonary disease, unspecified: Secondary | ICD-10-CM | POA: Insufficient documentation

## 2018-07-13 DIAGNOSIS — C3492 Malignant neoplasm of unspecified part of left bronchus or lung: Secondary | ICD-10-CM | POA: Insufficient documentation

## 2018-07-13 DIAGNOSIS — K219 Gastro-esophageal reflux disease without esophagitis: Secondary | ICD-10-CM | POA: Diagnosis not present

## 2018-07-13 DIAGNOSIS — Z5112 Encounter for antineoplastic immunotherapy: Secondary | ICD-10-CM | POA: Insufficient documentation

## 2018-07-13 DIAGNOSIS — G4730 Sleep apnea, unspecified: Secondary | ICD-10-CM | POA: Insufficient documentation

## 2018-07-13 DIAGNOSIS — Z87891 Personal history of nicotine dependence: Secondary | ICD-10-CM | POA: Insufficient documentation

## 2018-07-13 DIAGNOSIS — L309 Dermatitis, unspecified: Secondary | ICD-10-CM | POA: Insufficient documentation

## 2018-07-13 DIAGNOSIS — C7931 Secondary malignant neoplasm of brain: Secondary | ICD-10-CM | POA: Diagnosis not present

## 2018-07-13 DIAGNOSIS — C3490 Malignant neoplasm of unspecified part of unspecified bronchus or lung: Secondary | ICD-10-CM

## 2018-07-13 DIAGNOSIS — R21 Rash and other nonspecific skin eruption: Secondary | ICD-10-CM | POA: Insufficient documentation

## 2018-07-13 DIAGNOSIS — C7951 Secondary malignant neoplasm of bone: Secondary | ICD-10-CM | POA: Insufficient documentation

## 2018-07-13 DIAGNOSIS — Z79899 Other long term (current) drug therapy: Secondary | ICD-10-CM | POA: Insufficient documentation

## 2018-07-13 DIAGNOSIS — F419 Anxiety disorder, unspecified: Secondary | ICD-10-CM | POA: Diagnosis not present

## 2018-07-13 DIAGNOSIS — Z7952 Long term (current) use of systemic steroids: Secondary | ICD-10-CM | POA: Insufficient documentation

## 2018-07-13 DIAGNOSIS — Z510 Encounter for antineoplastic radiation therapy: Secondary | ICD-10-CM | POA: Insufficient documentation

## 2018-07-13 DIAGNOSIS — Z9221 Personal history of antineoplastic chemotherapy: Secondary | ICD-10-CM

## 2018-07-13 DIAGNOSIS — K209 Esophagitis, unspecified without bleeding: Secondary | ICD-10-CM

## 2018-07-13 DIAGNOSIS — Z7189 Other specified counseling: Secondary | ICD-10-CM

## 2018-07-13 DIAGNOSIS — Z8673 Personal history of transient ischemic attack (TIA), and cerebral infarction without residual deficits: Secondary | ICD-10-CM | POA: Insufficient documentation

## 2018-07-13 DIAGNOSIS — C342 Malignant neoplasm of middle lobe, bronchus or lung: Secondary | ICD-10-CM | POA: Diagnosis not present

## 2018-07-13 DIAGNOSIS — I10 Essential (primary) hypertension: Secondary | ICD-10-CM | POA: Insufficient documentation

## 2018-07-13 DIAGNOSIS — R110 Nausea: Secondary | ICD-10-CM

## 2018-07-13 LAB — CBC WITH DIFFERENTIAL/PLATELET
ABS IMMATURE GRANULOCYTES: 0.02 10*3/uL (ref 0.00–0.07)
BASOS PCT: 1 %
Basophils Absolute: 0 10*3/uL (ref 0.0–0.1)
EOS ABS: 0.2 10*3/uL (ref 0.0–0.5)
EOS PCT: 5 %
HCT: 39.3 % (ref 39.0–52.0)
Hemoglobin: 12.2 g/dL — ABNORMAL LOW (ref 13.0–17.0)
Immature Granulocytes: 1 %
Lymphocytes Relative: 4 %
Lymphs Abs: 0.2 10*3/uL — ABNORMAL LOW (ref 0.7–4.0)
MCH: 29.4 pg (ref 26.0–34.0)
MCHC: 31 g/dL (ref 30.0–36.0)
MCV: 94.7 fL (ref 80.0–100.0)
MONO ABS: 0.6 10*3/uL (ref 0.1–1.0)
Monocytes Relative: 14 %
NEUTROS ABS: 3.2 10*3/uL (ref 1.7–7.7)
Neutrophils Relative %: 75 %
PLATELETS: 137 10*3/uL — AB (ref 150–400)
RBC: 4.15 MIL/uL — AB (ref 4.22–5.81)
RDW: 14.4 % (ref 11.5–15.5)
WBC: 4.2 10*3/uL (ref 4.0–10.5)
nRBC: 0 % (ref 0.0–0.2)

## 2018-07-13 LAB — COMPREHENSIVE METABOLIC PANEL
ALT: 14 U/L (ref 0–44)
ANION GAP: 7 (ref 5–15)
AST: 21 U/L (ref 15–41)
Albumin: 3.9 g/dL (ref 3.5–5.0)
Alkaline Phosphatase: 59 U/L (ref 38–126)
BUN: 12 mg/dL (ref 6–20)
CHLORIDE: 105 mmol/L (ref 98–111)
CO2: 25 mmol/L (ref 22–32)
Calcium: 8.6 mg/dL — ABNORMAL LOW (ref 8.9–10.3)
Creatinine, Ser: 1.06 mg/dL (ref 0.61–1.24)
Glucose, Bld: 118 mg/dL — ABNORMAL HIGH (ref 70–99)
POTASSIUM: 4.5 mmol/L (ref 3.5–5.1)
SODIUM: 137 mmol/L (ref 135–145)
Total Bilirubin: 0.9 mg/dL (ref 0.3–1.2)
Total Protein: 7.1 g/dL (ref 6.5–8.1)

## 2018-07-13 LAB — TSH: TSH: 0.226 u[IU]/mL — AB (ref 0.350–4.500)

## 2018-07-13 LAB — LACTATE DEHYDROGENASE: LDH: 323 U/L — AB (ref 98–192)

## 2018-07-13 MED ORDER — MORPHINE SULFATE (PF) 2 MG/ML IV SOLN
INTRAVENOUS | Status: AC
  Filled 2018-07-13: qty 1

## 2018-07-13 MED ORDER — ALUM & MAG HYDROXIDE-SIMETH 200-200-20 MG/5ML PO SUSP
30.0000 mL | Freq: Once | ORAL | Status: AC
  Administered 2018-07-13: 30 mL via ORAL
  Filled 2018-07-13: qty 30

## 2018-07-13 MED ORDER — HEPARIN SOD (PORK) LOCK FLUSH 100 UNIT/ML IV SOLN
500.0000 [IU] | Freq: Once | INTRAVENOUS | Status: AC | PRN
  Administered 2018-07-13: 500 [IU]

## 2018-07-13 MED ORDER — SODIUM CHLORIDE 0.9 % IV SOLN
1.0000 mg/kg | Freq: Once | INTRAVENOUS | Status: AC
  Administered 2018-07-13: 90 mg via INTRAVENOUS
  Filled 2018-07-13: qty 18

## 2018-07-13 MED ORDER — MORPHINE SULFATE (PF) 2 MG/ML IV SOLN
2.0000 mg | Freq: Once | INTRAVENOUS | Status: AC
  Administered 2018-07-13: 2 mg via INTRAVENOUS

## 2018-07-13 MED ORDER — SODIUM CHLORIDE 0.9 % IV SOLN
2.8700 mg/kg | Freq: Once | INTRAVENOUS | Status: AC
  Administered 2018-07-13: 240 mg via INTRAVENOUS
  Filled 2018-07-13: qty 24

## 2018-07-13 MED ORDER — FAMOTIDINE IN NACL 20-0.9 MG/50ML-% IV SOLN
INTRAVENOUS | Status: AC
  Filled 2018-07-13: qty 50

## 2018-07-13 MED ORDER — DIPHENHYDRAMINE HCL 50 MG/ML IJ SOLN
25.0000 mg | Freq: Once | INTRAMUSCULAR | Status: AC
  Administered 2018-07-13: 25 mg via INTRAVENOUS

## 2018-07-13 MED ORDER — SODIUM CHLORIDE 0.9 % IV SOLN
Freq: Once | INTRAVENOUS | Status: AC
  Administered 2018-07-13: 10:00:00 via INTRAVENOUS

## 2018-07-13 MED ORDER — PROCHLORPERAZINE MALEATE 10 MG PO TABS: 10.0000 mg | ORAL_TABLET | Freq: Four times a day (QID) | ORAL | 3 refills | Status: DC | PRN

## 2018-07-13 MED ORDER — ONDANSETRON HCL 4 MG/2ML IJ SOLN
INTRAMUSCULAR | Status: AC
  Filled 2018-07-13: qty 2

## 2018-07-13 MED ORDER — ONDANSETRON HCL 4 MG/2ML IJ SOLN
4.0000 mg | Freq: Once | INTRAMUSCULAR | Status: AC
  Administered 2018-07-13: 4 mg via INTRAVENOUS

## 2018-07-13 MED ORDER — ALUM & MAG HYDROXIDE-SIMETH 200-200-20 MG/5ML PO SUSP
30.0000 mL | Freq: Once | ORAL | Status: DC
  Filled 2018-07-13: qty 30

## 2018-07-13 MED ORDER — SODIUM CHLORIDE 0.9% FLUSH
10.0000 mL | INTRAVENOUS | Status: DC | PRN
  Administered 2018-07-13: 10 mL
  Filled 2018-07-13: qty 10

## 2018-07-13 MED ORDER — PROCHLORPERAZINE EDISYLATE 10 MG/2ML IJ SOLN
10.0000 mg | Freq: Once | INTRAMUSCULAR | Status: AC
  Administered 2018-07-13: 10 mg via INTRAVENOUS
  Filled 2018-07-13: qty 2

## 2018-07-13 MED ORDER — MORPHINE SULFATE 4 MG/ML IJ SOLN: 2.0000 mg | Freq: Once | INTRAMUSCULAR | Status: DC

## 2018-07-13 MED ORDER — LIDOCAINE VISCOUS HCL 2 % MT SOLN
15.0000 mL | Freq: Once | OROMUCOSAL | Status: DC
  Filled 2018-07-13: qty 15

## 2018-07-13 MED ORDER — FAMOTIDINE IN NACL 20-0.9 MG/50ML-% IV SOLN
20.0000 mg | Freq: Once | INTRAVENOUS | Status: AC
  Administered 2018-07-13: 20 mg via INTRAVENOUS

## 2018-07-13 MED ORDER — LIDOCAINE VISCOUS HCL 2 % MT SOLN
15.0000 mL | Freq: Once | OROMUCOSAL | Status: AC
  Administered 2018-07-13: 15 mL via ORAL
  Filled 2018-07-13: qty 15

## 2018-07-13 MED ORDER — DIPHENHYDRAMINE HCL 50 MG/ML IJ SOLN
INTRAMUSCULAR | Status: AC
  Filled 2018-07-13: qty 1

## 2018-07-13 NOTE — Progress Notes (Signed)
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     06/15/2018 -  Chemotherapy    The patient had ipilimumab (YERVOY) 90 mg in sodium chloride 0.9 % 50 mL chemo infusion, 1 mg/kg = 90 mg, Intravenous,  Once, 2 of 4 cycles Administration: 90 mg (06/22/2018), 90 mg (07/13/2018) nivolumab (OPDIVO) 270 mg in sodium chloride 0.9 % 100 mL chemo infusion, 3 mg/kg = 270 mg, Intravenous, Once, 2 of 8 cycles Administration: 270 mg (06/22/2018), 240 mg (07/13/2018)  for chemotherapy treatment.       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 Ehle 57 y.o. male returns for routine follow-up for small cell lung cancer. Today is his last day of radiation. Patient is having fevers and chills every night. Fevers are 102 to 103 and he takes tylenol to treat them. The fevers make him nauseous and weak. He is still coughing however denies hemoptysis.He is having  difficulties swallowing due to the radiation.  Patient also has an ear infection and he is currently on a round of antibiotic. He reports issues with constipation. He denies any vomiting or diarrhea. Denies any bleeding or easy bruising. Denies any worsening of cough. Denies any joint pain or swelling. He reports his appetite at 50% and he is maintaining his weight at this time. His energy level is 25%.    REVIEW OF SYSTEMS:  Review of Systems  Constitutional: Positive for fatigue and fever.  HENT:   Positive for trouble swallowing.   Respiratory: Positive for shortness of breath.   Gastrointestinal: Positive for constipation and nausea.  Neurological: Positive for extremity weakness and numbness.  All other systems reviewed and are negative.    PAST MEDICAL/SURGICAL HISTORY:  Past Medical History:  Diagnosis Date  . Anxiety   . Asthma   . COPD (chronic obstructive pulmonary disease) ([place])   . DDD (degenerative disc disease)   . Diverticulitis   . GERD (gastroesophageal reflux disease)   . Headache   . Hepatitis    in the late 70's   . HTN (hypertension)   . Mediastinal adenopathy   . Seizures ([place])    as of 2019 none for 20 years  . Sleep apnea    does not  use cpap  . Stroke [facility])    TIA - Feb. 2018   Past Surgical History:  Procedure Laterality Date  . CERVICAL DISC SURGERY     X4  . CHOLECYSTECTOMY    . COLONOSCOPY  08/24/2011  Procedure: COLONOSCOPY;  Surgeon: Daneil Dolin, MD;  Location: [facility];  Service: Endoscopy;  Laterality: N/A;  7:30AM  . ELBOW SURGERY     right  . PORTACATH PLACEMENT Right 12/01/2017   Procedure: INSERTION PORT-A-CATH;  Surgeon: Aviva Signs, MD;  Location: [facility] ORS;  Service: General;  Laterality: Right;  Marland Kitchen VIDEO BRONCHOSCOPY WITH ENDOBRONCHIAL ULTRASOUND N/A 10/31/2017   Procedure: VIDEO BRONCHOSCOPY WITH ENDOBRONCHIAL ULTRASOUND;  Surgeon: Ivin Poot, MD;  Location: [facility];  Service: Thoracic;  Laterality: N/A;  . VIDEO  BRONCHOSCOPY WITH ENDOBRONCHIAL ULTRASOUND N/A 11/22/2017   Procedure: VIDEO BRONCHOSCOPY WITH ENDOBRONCHIAL ULTRASOUND;  Surgeon: Melrose Nakayama, MD;  Location: [facility];  Service: Thoracic;  Laterality: N/A;     SOCIAL HISTORY:  Social History   Socioeconomic History  . Marital status: Married    Spouse name: Not on file  . Number of children: Not on file  . Years of education: Not on file  . Highest education level: Not on file  Occupational History  . Occupation: disability  Social Needs  . Financial resource strain: Not on file  . Food insecurity:    Worry: Not on file    Inability: Not on file  . Transportation needs:    Medical: No    Non-medical: No  Tobacco Use  . Smoking status: Former Smoker    Packs/day: 1.50    Years: 30.00    Pack years: 45.00    Types: Cigarettes    Last attempt to quit: 10/13/2017    Years since quitting: 0.7  . Smokeless tobacco: Never Used  Substance and Sexual Activity  . Alcohol use: No  . Drug use: No  . Sexual activity: Yes    Birth control/protection: None  Lifestyle  . Physical activity:    Days per week: Not on file    Minutes per session: Not on file  . Stress: Not on file  Relationships  . Social connections:    Talks on phone: Not on file    Gets together: Not on file    Attends religious service: Not on file    Active member of club or organization: Not on file    Attends meetings of clubs or organizations: Not on file    Relationship status: Not on file  . Intimate partner violence:    Fear of current or ex partner: Not on file    Emotionally abused: Not on file    Physically abused: Not on file    Forced sexual activity: Not on file  Other Topics Concern  . Not on file  Social History Narrative   06-20-18 Unable to ask abuse questions wife with him today.    FAMILY HISTORY:  Family History  Problem Relation Age of Onset  . Hypertension Father   . Heart attack Father   . Kidney failure Mother   .  Diabetes Mother   . Hypertension Mother   . Colon cancer Neg Hx   . Anesthesia problems Neg Hx   . Hypotension Neg Hx   . Malignant hyperthermia Neg Hx   . Pseudochol deficiency Neg Hx     CURRENT MEDICATIONS:  Outpatient Encounter Medications as of 07/13/2018  Medication Sig Note  . albuterol (PROVENTIL HFA;VENTOLIN HFA) 108 (90 BASE) MCG/ACT inhaler Inhale 2 puffs into the lungs every 6 (six) hours as needed for wheezing or shortness of breath.    Marland Kitchen albuterol (PROVENTIL) (2.5 MG/3ML) 0.083% nebulizer solution Take 2.5 mg by nebulization every 6 (  six) hours as needed for wheezing or shortness of breath.    . alprazolam (XANAX) 2 MG tablet Take 2 mg by mouth every 6 (six) hours as needed for sleep or anxiety.    Marland Kitchen amLODipine (NORVASC) 10 MG tablet Take 10 mg by mouth daily.   Marland Kitchen CALCIUM-MAGNESIUM-ZINC PO Take 1,200 mg by mouth daily.   . cephALEXin (KEFLEX) 500 MG capsule    . COLLAGEN PO Take 7 g by mouth daily.   . cyclobenzaprine (FLEXERIL) 10 MG tablet Take 10 mg by mouth daily as needed for muscle spasms.    . diclofenac sodium (VOLTAREN) 1 % GEL Apply 2 g topically 4 (four) times daily. Apply to left forearm phlebitic area   . docusate sodium (COLACE) 100 MG capsule Take 1 capsule (100 mg total) by mouth every 12 (twelve) hours.   . Flaxseed, Linseed, (FLAXSEED OIL PO) Take 1 tablet by mouth 2 (two) times daily.    . fluticasone (FLONASE) 50 MCG/ACT nasal spray Place 2 sprays into both nostrils daily as needed for allergies.    . furosemide (LASIX) 20 MG tablet Take 10 mg by mouth daily.    . Glucosamine-Chondroit-Vit C-Mn (GLUCOSAMINE 1500 COMPLEX) CAPS Take 1 capsule by mouth.   . Ipilimumab (YERVOY IV) Inject into the vein.   Marland Kitchen levETIRAcetam (KEPPRA) 500 MG tablet Take 500 mg by mouth daily.    Marland Kitchen lidocaine (XYLOCAINE) 2 % solution Use as directed 15 mLs in the mouth or throat every 4 (four) hours as needed for mouth pain.    Marland Kitchen lidocaine-prilocaine (EMLA) cream APPLY TO PORTACATH  SITE AS DIRECTED   . lisinopril (PRINIVIL,ZESTRIL) 20 MG tablet Take 20 mg by mouth 2 (two) times daily.   . meclizine (ANTIVERT) 12.5 MG tablet Take 1 tablet (12.5 mg total) by mouth 3 (three) times daily as needed for dizziness.   . MULTIPLE VITAMINS PO Take 1 tablet by mouth daily.    . mupirocin ointment (BACTROBAN) 2 % APPLY TO SKIN TID PRN   . naproxen (NAPROSYN) 500 MG tablet Take 500 mg by mouth 2 (two) times daily with a meal.     . Nivolumab (OPDIVO IV) Inject into the vein.   Marland Kitchen oxyCODONE (ROXICODONE) 15 MG immediate release tablet Take 15 mg by mouth 4 (four) times daily as needed for pain.    . pantoprazole (PROTONIX) 40 MG tablet Take 40 mg by mouth at bedtime.    . polyethylene glycol (MIRALAX / GLYCOLAX) packet Take 17 g by mouth daily.   . pravastatin (PRAVACHOL) 20 MG tablet Take 20 mg by mouth daily.    . predniSONE (DELTASONE) 10 MG tablet Take 1 tablet (10 mg total) by mouth daily with breakfast. 06/20/2018: Stopped two weeks ago  . prochlorperazine (COMPAZINE) 10 MG tablet Take 1 tablet (10 mg total) by mouth every 6 (six) hours as needed for nausea or vomiting.   . sucralfate (CARAFATE) 1 g tablet Take 1 tablet (1 g total) by mouth 4 (four) times daily.   . tamsulosin (FLOMAX) 0.4 MG CAPS capsule Take 0.4 mg by mouth daily.    . temazepam (RESTORIL) 30 MG capsule TAKE 1 CAPSULE AT BEDTIME AS NEEDED FOR SLEEP   . triamcinolone cream (KENALOG) 0.1 % Apply 1 application topically 2 (two) times daily.    . TURMERIC PO Take 4 tablets by mouth daily.    . [DISCONTINUED] prochlorperazine (COMPAZINE) 10 MG tablet Take 1 tablet (10 mg total) by mouth every 6 (six) hours as  needed for nausea or vomiting.    Facility-Administered Encounter Medications as of 07/13/2018  Medication  . alum & mag hydroxide-simeth (MAALOX/MYLANTA) 200-200-20 MG/5ML suspension 30 mL   And  . lidocaine (XYLOCAINE) 2 % viscous mouth solution 15 mL    ALLERGIES:  Allergies  Allergen Reactions  . Xgeva  [Denosumab]     Rash   . Tecentriq [Atezolizumab] Rash     PHYSICAL EXAM:  ECOG Performance status: 1  Vitals:   07/13/18 0953  BP: 115/74  Pulse: 90  Resp: 18  Temp: 98 F (36.7 C)  SpO2: 99%   Filed Weights   07/13/18 0953  Weight: 184 lb 8 oz (83.7 kg)    Physical Exam  Constitutional: He is oriented to person, place, and time. He appears well-developed and well-nourished.  Cardiovascular: Normal rate, regular rhythm and normal heart sounds.  Pulmonary/Chest: Effort normal. He has wheezes.  Musculoskeletal: Normal range of motion.  Neurological: He is alert and oriented to person, place, and time.  Skin: Skin is warm and dry.  Psychiatric: He has a normal mood and affect. His behavior is normal. Judgment and thought content normal.     LABORATORY DATA:  I have reviewed the labs as listed.  CBC    Component Value Date/Time   WBC 4.2 07/13/2018 0906   RBC 4.15 (L) 07/13/2018 0906   HGB 12.2 (L) 07/13/2018 0906   HCT 39.3 07/13/2018 0906   PLT 137 (L) 07/13/2018 0906   MCV 94.7 07/13/2018 0906   MCH 29.4 07/13/2018 0906   MCHC 31.0 07/13/2018 0906   RDW 14.4 07/13/2018 0906   LYMPHSABS 0.2 (L) 07/13/2018 0906   MONOABS 0.6 07/13/2018 0906   EOSABS 0.2 07/13/2018 0906   BASOSABS 0.0 07/13/2018 0906   CMP Latest Ref Rng & Units 07/13/2018 07/03/2018 06/22/2018  Glucose 70 - 99 mg/dL 118(H) 107(H) 125(H)  BUN 6 - 20 mg/dL 12 10 13   Creatinine 0.61 - 1.24 mg/dL 1.06 0.81 0.92  Sodium 135 - 145 mmol/L 137 137 138  Potassium 3.5 - 5.1 mmol/L 4.5 3.5 3.8  Chloride 98 - 111 mmol/L 105 109 108  CO2 22 - 32 mmol/L 25 22 21(L)  Calcium 8.9 - 10.3 mg/dL 8.6(L) 8.2(L) 8.7(L)  Total Protein 6.5 - 8.1 g/dL 7.1 6.4(L) 7.3  Total Bilirubin 0.3 - 1.2 mg/dL 0.9 0.6 0.7  Alkaline Phos 38 - 126 U/L 59 49 54  AST 15 - 41 U/L 21 20 19   ALT 0 - 44 U/L 14 16 16        DIAGNOSTIC IMAGING:  I have independently reviewed images of the MRI.    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  Extensive stage small cell lung cancer: -4 cycles of carboplatin, etoposide and Atezolizumab from 11/27/2017 through 01/29/2018 -CT scan of the chest, abdomen after 3 cycles of chemo immunotherapy on 01/25/2018 shows near complete resolution of the adenopathy and lung mass.  Sclerosis and bone mets noted.  - Maintenance Atezolizumab started on 02/28/2018, second dose on 03/22/2018, each time resulting in severe urticarial rash on the upper trunk and lower extremities, improved on steroid therapy. -He has gotten severe urticarial rash from Atezolizumab.  I have discussed the results of the CT scan of the chest, abdomen and pelvis dated 04/06/2018 which showed no evidence of disease progression.  Previously measured mediastinal lymph nodes are barely detectable.  Dominant left hilar lymph node measures 2.1 x 0.9 cm.  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  Mild T11 compression fracture with less than 10% height loss was also seen. - I have sent him for a second opinion with Dr. Elmyra Ricks at [facility]. -He was evaluated by Dr. Berta Minor on 04/19/2018.  He was also referred to [facility] dermatology Dr. Royanne Foots. -It was suggested to give another trial of Atezolizumab.  He discontinued dexamethasone 4 mg daily and started taking prednisone 10 mg daily.  He was also prescribed triamcinolone 0.1% cream to be used twice daily. - He developed breathing difficulty with shortness of breath on exertion and on and off chest pain for the last 3 weeks.  He reportedly went to the ER on 06/04/2018 with a did a CT scan of the chest PE protocol and CT of the abdomen and pelvis. - I have reviewed the images of the CT scan independently and discussed the results with the patient.  It showed progression of the left hilar adenopathy compared to the prior exam from 2 months ago.  This currently measures about 2.3 x 4 cm in size.   Subcarinal lymph node also increased in size compared to prior exam.  No new areas were seen. - Atezolizumab discontinued after last dose on 05/18/2018 secondary to progression. - He was evaluated by Dr. Elmyra Ricks at [facility].  Combination immunotherapy with ipilimumab and nivolumab was recommended, as responses with chemotherapy is very low as his cancer has progressed within 3 months of platinum induction regimen. - Cycle 1 of combination immunotherapy was given on 06/22/2018.  Patient has been having low-grade fevers at nighttime.  He was evaluated in the ER on 07/03/2018 and all the work-up was negative.  Blood cultures did not show any growth. - MRI of the brain on 06/28/2018 showed 5 new small round enhancing brain lesions compatible with metastasis.  He finished whole brain radiation therapy on 07/13/2018. - He has been off of prednisone.  He may proceed with cycle 2 of ipilimumab and nivolumab today.  I will see him back in 3 weeks for follow-up.  2. Bone metastasis: - He will continue zoledronic acid every 4 weeks.  3.  Skin rash: - He had severe skin rash (lichenoid dermatitis) on the trunk and upper thighs after each dose of Atezolizumab.  This responded very well to steroids. -She was evaluated by Dr. Koleen Distance [facility].  She prescribed him triamcinolone 0.1% cream to be applied twice daily. -She recommended to give another trial of Atezolizumab.  He will continue prednisone 10 mg daily.  He will continue to use the triamcinolone cream.  She would like to see him 4 to 5 days after Atezolizumab dose.  Hence we have made a follow-up visit with her on 05/03/2018.  He gets diffuse rash typically 7 days after each dose of Atezolizumab. - She would consider starting him on [place].  Cyclosporine at a dose of 1.5 mg to 2.5 mg per kilogram twice a day is also an option. -He did not get any rash after his last doses on 04/27/2018 and 05/18/2018.  He did not  get any rash on the combination immunotherapy.      Orders placed this encounter:  Orders Placed This Encounter  Procedures  . CBC with Differential/Platelet  . Comprehensive metabolic panel  . Lactate dehydrogenase  . TSH      Derek Jack, [place] [phone]

## 2018-07-13 NOTE — Assessment & Plan Note (Signed)
1.  Extensive stage small cell lung cancer: -4 cycles of carboplatin, etoposide and Atezolizumab from 11/27/2017 through 01/29/2018 -CT scan of the chest, abdomen after 3 cycles of chemo immunotherapy on 01/25/2018 shows near complete resolution of the adenopathy and lung mass.  Sclerosis and bone mets noted.  - Maintenance Atezolizumab started on 02/28/2018, second dose on 03/22/2018, each time resulting in severe urticarial rash on the upper trunk and lower extremities, improved on steroid therapy. -He has gotten severe urticarial rash from Atezolizumab.  I have discussed the results of the CT scan of the chest, abdomen and pelvis dated 04/06/2018 which showed no evidence of disease progression.  Previously measured mediastinal lymph nodes are barely detectable.  Dominant left hilar lymph node measures 2.1 x 0.9 cm.  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  Mild T11 compression fracture with less than 10% height loss was also seen. - I have sent him for a second opinion with Dr. Elmyra Ricks at [facility]. -He was evaluated by Dr. Berta Minor on 04/19/2018.  He was also referred to [facility] dermatology Dr. Royanne Foots. -It was suggested to give another trial of Atezolizumab.  He discontinued dexamethasone 4 mg daily and started taking prednisone 10 mg daily.  He was also prescribed triamcinolone 0.1% cream to be used twice daily. - He developed breathing difficulty with shortness of breath on exertion and on and off chest pain for the last 3 weeks.  He reportedly went to the ER on 06/04/2018 with a did a CT scan of the chest PE protocol and CT of the abdomen and pelvis. - I have reviewed the images of the CT scan independently and discussed the results with the patient.  It showed progression of the left hilar adenopathy compared to the prior exam from 2 months ago.  This currently measures about 2.3 x 4 cm in size.  Subcarinal lymph node also increased in size compared to prior exam.   No new areas were seen. - Atezolizumab discontinued after last dose on 05/18/2018 secondary to progression. - He was evaluated by Dr. Elmyra Ricks at [facility].  Combination immunotherapy with ipilimumab and nivolumab was recommended, as responses with chemotherapy is very low as his cancer has progressed within 3 months of platinum induction regimen. - Cycle 1 of combination immunotherapy was given on 06/22/2018.  Patient has been having low-grade fevers at nighttime.  He was evaluated in the ER on 07/03/2018 and all the work-up was negative.  Blood cultures did not show any growth. - MRI of the brain on 06/28/2018 showed 5 new small round enhancing brain lesions compatible with metastasis.  He finished whole brain radiation therapy on 07/13/2018. - He has been off of prednisone.  He may proceed with cycle 2 of ipilimumab and nivolumab today.  I will see him back in 3 weeks for follow-up.  2. Bone metastasis: - He will continue zoledronic acid every 4 weeks.  3.  Skin rash: - He had severe skin rash (lichenoid dermatitis) on the trunk and upper thighs after each dose of Atezolizumab.  This responded very well to steroids. -She was evaluated by Dr. Koleen Distance [facility].  She prescribed him triamcinolone 0.1% cream to be applied twice daily. -She recommended to give another trial of Atezolizumab.  He will continue prednisone 10 mg daily.  He will continue to use the triamcinolone cream.  She would like to see him 4 to 5 days after Atezolizumab dose.  Hence we have  made a follow-up visit with her on 05/03/2018.  He gets diffuse rash typically 7 days after each dose of Atezolizumab. - She would consider starting him on [place].  Cyclosporine at a dose of 1.5 mg to 2.5 mg per kilogram twice a day is also an option. -He did not get any rash after his last doses on 04/27/2018 and 05/18/2018.  He did not get any rash on the combination immunotherapy.

## 2018-07-13 NOTE — Patient Instructions (Signed)
[facility] Discharge Instructions for Patients Receiving Chemotherapy  Today you received the following chemotherapy agents yerovoy and opdivo.    If you develop nausea and vomiting that is not controlled by your nausea medication, call the clinic.   BELOW ARE SYMPTOMS THAT SHOULD BE REPORTED IMMEDIATELY:  *FEVER GREATER THAN 100.5 F  *CHILLS WITH OR WITHOUT FEVER  NAUSEA AND VOMITING THAT IS NOT CONTROLLED WITH YOUR NAUSEA MEDICATION  *UNUSUAL SHORTNESS OF BREATH  *UNUSUAL BRUISING OR BLEEDING  TENDERNESS IN MOUTH AND THROAT WITH OR WITHOUT PRESENCE OF ULCERS  *URINARY PROBLEMS  *BOWEL PROBLEMS  UNUSUAL RASH Items with * indicate a potential emergency and should be followed up as soon as possible.  Feel free to call the clinic should you have any questions or concerns. The clinic phone number is [phone].  Please show the [facility] at check-in to the Emergency Department and triage nurse.

## 2018-07-13 NOTE — Patient Instructions (Signed)
[facility] at [facility] Discharge Instructions  Follow up in 3 weeks with labs    Thank you for choosing [facility] at [facility] to provide your oncology and hematology care.  To afford each patient quality time with our provider, please arrive at least 15 minutes before your scheduled appointment time.   If you have a lab appointment with the [facility] please come in thru the  Main Entrance and check in at the main information desk  You need to re-schedule your appointment should you arrive 10 or more minutes late.  We strive to give you quality time with our providers, and arriving late affects you and other patients whose appointments are after yours.  Also, if you no show three or more times for appointments you may be dismissed from the clinic at the providers discretion.     Again, thank you for choosing [facility]  Our hope is that these requests will decrease the amount of time that you wait before being seen by our physicians.       _____________________________________________________________  Should you have questions after your visit to [facility], please contact our office at [phone] between the hours of 8:00 a.m. and 4:30 p.m.  Voicemails left after 4:00 p.m. will not be returned until the following business day.  For prescription refill requests, have your pharmacy contact our office and allow 72 hours.    Cancer Center Support Programs:   > Cancer Support Group  2nd Tuesday of the month 1pm-2pm, Journey Room

## 2018-07-13 NOTE — Progress Notes (Signed)
To treatment area for therapy.  Orders for zofran, morphine, and maalox with viscous lidocaine verbal Dr. Delton Coombes.  See MAR.  Family by side.  1043-Morphine 2mg  IV given for esophageal pain.  Denied allergies to medication.  Family at side.   1050-patient stated decrease in discomfort.  No s/s of distress noted.  1212-patient rating esophageal pain 6 on scale and asking for pain medication and nausea medication.  Orders received from Dr. Delton Coombes.  1233- Morphine 2mg  IV given for esophageal pain. No s/s of distress noted.   1250-patient resting with pain score of 4 on scale.  No s/s of distress noted.  Family at side.   1256-resting with eyes closed.  Family at side.   Patient tolerated therapy with no complaints voiced.  Port site clean and dry with no bruising or swelling noted at site.  Band aid applied.  VSS with discharge and left ambulatory with family with no s/s of distress noted.

## 2018-07-14 DIAGNOSIS — J449 Chronic obstructive pulmonary disease, unspecified: Secondary | ICD-10-CM | POA: Diagnosis not present

## 2018-07-16 ENCOUNTER — Inpatient Hospital Stay (HOSPITAL_COMMUNITY): Payer: Medicare HMO

## 2018-07-16 ENCOUNTER — Ambulatory Visit: Payer: Medicare HMO

## 2018-07-16 VITALS — BP 155/72 | HR 124 | Resp 16

## 2018-07-16 DIAGNOSIS — Z5112 Encounter for antineoplastic immunotherapy: Secondary | ICD-10-CM | POA: Diagnosis not present

## 2018-07-16 DIAGNOSIS — C3490 Malignant neoplasm of unspecified part of unspecified bronchus or lung: Secondary | ICD-10-CM

## 2018-07-16 DIAGNOSIS — Z1389 Encounter for screening for other disorder: Secondary | ICD-10-CM | POA: Diagnosis not present

## 2018-07-16 DIAGNOSIS — I10 Essential (primary) hypertension: Secondary | ICD-10-CM | POA: Diagnosis not present

## 2018-07-16 DIAGNOSIS — E119 Type 2 diabetes mellitus without complications: Secondary | ICD-10-CM | POA: Diagnosis not present

## 2018-07-16 DIAGNOSIS — E663 Overweight: Secondary | ICD-10-CM | POA: Diagnosis not present

## 2018-07-16 DIAGNOSIS — Z6827 Body mass index (BMI) 27.0-27.9, adult: Secondary | ICD-10-CM | POA: Diagnosis not present

## 2018-07-16 DIAGNOSIS — J449 Chronic obstructive pulmonary disease, unspecified: Secondary | ICD-10-CM | POA: Diagnosis not present

## 2018-07-16 DIAGNOSIS — H6092 Unspecified otitis externa, left ear: Secondary | ICD-10-CM | POA: Diagnosis not present

## 2018-07-16 LAB — BASIC METABOLIC PANEL
Anion gap: 13 (ref 5–15)
BUN: 15 mg/dL (ref 6–20)
CALCIUM: 8 mg/dL — AB (ref 8.9–10.3)
CHLORIDE: 104 mmol/L (ref 98–111)
CO2: 18 mmol/L — AB (ref 22–32)
CREATININE: 0.86 mg/dL (ref 0.61–1.24)
GFR calc Af Amer: >60 mL/min (ref >60)
GFR calc non Af Amer: >60 mL/min (ref >60)
GLUCOSE: 110 mg/dL — AB (ref 70–99)
Potassium: 3.5 mmol/L (ref 3.5–5.1)
Sodium: 135 mmol/L (ref 135–145)

## 2018-07-16 LAB — MAGNESIUM: Magnesium: 2.1 mg/dL (ref 1.7–2.4)

## 2018-07-16 MED ORDER — MORPHINE SULFATE 4 MG/ML IJ SOLN: 2.0000 mg | INTRAMUSCULAR | Status: DC | PRN

## 2018-07-16 MED ORDER — SODIUM CHLORIDE 0.9 % IV SOLN
INTRAVENOUS | Status: AC
  Administered 2018-07-16: 13:00:00 via INTRAVENOUS

## 2018-07-16 MED ORDER — SODIUM CHLORIDE 0.9 % IV SOLN: 2.0000 mg/h | Freq: Once | INTRAVENOUS | Status: DC

## 2018-07-16 MED ORDER — ONDANSETRON HCL 4 MG/2ML IJ SOLN
4.0000 mg | Freq: Once | INTRAMUSCULAR | Status: AC
  Administered 2018-07-16: 4 mg via INTRAVENOUS

## 2018-07-16 MED ORDER — MORPHINE BOLUS VIA INFUSION
2.0000 mg | INTRAVENOUS | Status: DC | PRN
  Filled 2018-07-16: qty 2

## 2018-07-16 MED ORDER — MORPHINE SULFATE (CONCENTRATE) 10 MG /0.5 ML PO SOLN: 10.0000 mg | Freq: Four times a day (QID) | ORAL | 0 refills | Status: DC | PRN

## 2018-07-16 MED ORDER — MORPHINE SULFATE (PF) 2 MG/ML IV SOLN
INTRAVENOUS | Status: AC
  Filled 2018-07-16: qty 1

## 2018-07-16 MED ORDER — MORPHINE SULFATE (PF) 2 MG/ML IV SOLN
2.0000 mg | INTRAVENOUS | Status: AC | PRN
  Administered 2018-07-16 (×2): 2 mg via INTRAVENOUS

## 2018-07-17 ENCOUNTER — Inpatient Hospital Stay (HOSPITAL_COMMUNITY): Payer: Medicare HMO

## 2018-07-17 VITALS — BP 144/71 | HR 100 | Temp 97.8°F | Resp 18

## 2018-07-17 DIAGNOSIS — A419 Sepsis, unspecified organism: Secondary | ICD-10-CM | POA: Diagnosis not present

## 2018-07-17 DIAGNOSIS — H6022 Malignant otitis externa, left ear: Secondary | ICD-10-CM | POA: Diagnosis not present

## 2018-07-17 DIAGNOSIS — C3490 Malignant neoplasm of unspecified part of unspecified bronchus or lung: Secondary | ICD-10-CM

## 2018-07-17 MED ORDER — ONDANSETRON HCL 4 MG/2ML IJ SOLN
4.0000 mg | Freq: Once | INTRAMUSCULAR | Status: AC
  Administered 2018-07-17: 4 mg via INTRAVENOUS
  Filled 2018-07-17: qty 2

## 2018-07-17 MED ORDER — LIDOCAINE VISCOUS HCL 2 % MT SOLN: 15.0000 mL | OROMUCOSAL | 1 refills | Status: AC | PRN

## 2018-07-17 MED ORDER — SODIUM CHLORIDE 0.9 % IV SOLN
INTRAVENOUS | Status: DC
  Administered 2018-07-17: 14:00:00 via INTRAVENOUS

## 2018-07-17 MED ORDER — FLUCONAZOLE 200 MG PO TABS: 200.0000 mg | ORAL_TABLET | Freq: Every day | ORAL | 0 refills | Status: DC

## 2018-07-17 NOTE — Patient Instructions (Signed)
[facility] at [facility] Discharge Instructions     Thank you for choosing [facility] at [facility] to provide your oncology and hematology care.  To afford each patient quality time with our provider, please arrive at least 15 minutes before your scheduled appointment time.   If you have a lab appointment with the Cancer Center please come in thru the  Main Entrance and check in at the main information desk  You need to re-schedule your appointment should you arrive 10 or more minutes late.  We strive to give you quality time with our providers, and arriving late affects you and other patients whose appointments are after yours.  Also, if you no show three or more times for appointments you may be dismissed from the clinic at the providers discretion.     Again, thank you for choosing [facility].  Our hope is that these requests will decrease the amount of time that you wait before being seen by our physicians.       _____________________________________________________________  Should you have questions after your visit to [facility], please contact our office at [phone] between the hours of 8:00 a.m. and 4:30 p.m.  Voicemails left after 4:00 p.m. will not be returned until the following business day.  For prescription refill requests, have your pharmacy contact our office and allow 72 hours.    Cancer Center Support Programs:   > Cancer Support Group  2nd Tuesday of the month 1pm-2pm, Journey Room    

## 2018-07-17 NOTE — Progress Notes (Signed)
Patient presented today for hydration fluids and anti nausea medications. Patient stated he has had about 46mls of water today and 3oz's of pudding. Patient requests something for nausea. Will given meds per orders.    Will hold off on zometa for Friday due to low calcium and patient is going to have some teeth pulled soon. MD aware.   Spoke with Dr. Delton Coombes regarding questions patient had today. Per Dr. Delton Coombes, he will do scans after the third cycle, he can go to [facility] for his follow up after his scans if pt wants too. Will try the morphine for pain this week and then talk Friday reagarding the need for a feeding tube placement. Patient and wife informed of the above.    Vitals stable and discharged home from clinic via wheelchair. Follow up as scheduled.

## 2018-07-18 ENCOUNTER — Inpatient Hospital Stay (HOSPITAL_COMMUNITY): Payer: Medicare HMO

## 2018-07-18 DIAGNOSIS — G4733 Obstructive sleep apnea (adult) (pediatric): Secondary | ICD-10-CM | POA: Diagnosis not present

## 2018-07-18 MED ORDER — SODIUM CHLORIDE 0.9 % IV SOLN
INTRAVENOUS | Status: DC
  Administered 2018-07-18: 13:00:00 via INTRAVENOUS

## 2018-07-18 MED ORDER — ONDANSETRON HCL 4 MG/2ML IJ SOLN
INTRAMUSCULAR | Status: AC
  Filled 2018-07-18: qty 2

## 2018-07-18 MED ORDER — ONDANSETRON HCL 4 MG/2ML IJ SOLN
4.0000 mg | Freq: Once | INTRAMUSCULAR | Status: AC
  Administered 2018-07-18: 4 mg via INTRAVENOUS

## 2018-07-18 NOTE — Patient Instructions (Signed)
[facility] at [facility] Discharge Instructions     Thank you for choosing [facility] at [facility] to provide your oncology and hematology care.  To afford each patient quality time with our provider, please arrive at least 15 minutes before your scheduled appointment time.   If you have a lab appointment with the Cancer Center please come in thru the  Main Entrance and check in at the main information desk  You need to re-schedule your appointment should you arrive 10 or more minutes late.  We strive to give you quality time with our providers, and arriving late affects you and other patients whose appointments are after yours.  Also, if you no show three or more times for appointments you may be dismissed from the clinic at the providers discretion.     Again, thank you for choosing [facility].  Our hope is that these requests will decrease the amount of time that you wait before being seen by our physicians.       _____________________________________________________________  Should you have questions after your visit to  Cancer Center, please contact our office at [phone] between the hours of 8:00 a.m. and 4:30 p.m.  Voicemails left after 4:00 p.m. will not be returned until the following business day.  For prescription refill requests, have your pharmacy contact our office and allow 72 hours.    Cancer Center Support Programs:   > Cancer Support Group  2nd Tuesday of the month 1pm-2pm, Journey Room    

## 2018-07-19 ENCOUNTER — Inpatient Hospital Stay (HOSPITAL_COMMUNITY): Payer: Medicare HMO

## 2018-07-19 MED ORDER — MORPHINE SULFATE (PF) 2 MG/ML IV SOLN
2.0000 mg | Freq: Once | INTRAVENOUS | Status: AC
  Administered 2018-07-19: 2 mg via INTRAVENOUS
  Filled 2018-07-19: qty 1

## 2018-07-19 MED ORDER — ONDANSETRON HCL 4 MG/2ML IJ SOLN
4.0000 mg | Freq: Once | INTRAMUSCULAR | Status: AC
  Administered 2018-07-19: 4 mg via INTRAVENOUS

## 2018-07-19 MED ORDER — SODIUM CHLORIDE 0.9 % IV SOLN
Freq: Once | INTRAVENOUS | Status: AC
  Administered 2018-07-19: 12:00:00 via INTRAVENOUS

## 2018-07-19 MED ORDER — ONDANSETRON HCL 4 MG/2ML IJ SOLN
INTRAMUSCULAR | Status: AC
  Filled 2018-07-19: qty 2

## 2018-07-20 ENCOUNTER — Emergency Department (HOSPITAL_COMMUNITY): Payer: Medicare HMO

## 2018-07-20 ENCOUNTER — Inpatient Hospital Stay (HOSPITAL_COMMUNITY): Payer: Medicare HMO

## 2018-07-20 ENCOUNTER — Inpatient Hospital Stay (HOSPITAL_COMMUNITY)
Admission: EM | Admit: 2018-07-20 | Discharge: 2018-07-25 | DRG: 872 | Disposition: A | Discharge status: Home Health | Payer: Medicare HMO | Attending: Internal Medicine | Admitting: Internal Medicine

## 2018-07-20 ENCOUNTER — Other Ambulatory Visit: Payer: Self-pay

## 2018-07-20 ENCOUNTER — Other Ambulatory Visit (HOSPITAL_COMMUNITY): Payer: Medicare HMO

## 2018-07-20 ENCOUNTER — Encounter (HOSPITAL_COMMUNITY): Payer: Self-pay | Admitting: Emergency Medicine

## 2018-07-20 VITALS — BP 114/67 | HR 121 | Temp 99.2°F | Resp 16 | Wt 180.2 lb

## 2018-07-20 DIAGNOSIS — C7951 Secondary malignant neoplasm of bone: Secondary | ICD-10-CM | POA: Diagnosis not present

## 2018-07-20 DIAGNOSIS — E119 Type 2 diabetes mellitus without complications: Secondary | ICD-10-CM | POA: Diagnosis present

## 2018-07-20 DIAGNOSIS — G4730 Sleep apnea, unspecified: Secondary | ICD-10-CM | POA: Diagnosis present

## 2018-07-20 DIAGNOSIS — C7931 Secondary malignant neoplasm of brain: Secondary | ICD-10-CM | POA: Diagnosis present

## 2018-07-20 DIAGNOSIS — Z841 Family history of disorders of kidney and ureter: Secondary | ICD-10-CM

## 2018-07-20 DIAGNOSIS — K208 Other esophagitis: Secondary | ICD-10-CM | POA: Diagnosis present

## 2018-07-20 DIAGNOSIS — Z833 Family history of diabetes mellitus: Secondary | ICD-10-CM

## 2018-07-20 DIAGNOSIS — N400 Enlarged prostate without lower urinary tract symptoms: Secondary | ICD-10-CM | POA: Diagnosis present

## 2018-07-20 DIAGNOSIS — E785 Hyperlipidemia, unspecified: Secondary | ICD-10-CM | POA: Diagnosis present

## 2018-07-20 DIAGNOSIS — J449 Chronic obstructive pulmonary disease, unspecified: Secondary | ICD-10-CM | POA: Diagnosis not present

## 2018-07-20 DIAGNOSIS — Z8249 Family history of ischemic heart disease and other diseases of the circulatory system: Secondary | ICD-10-CM

## 2018-07-20 DIAGNOSIS — C787 Secondary malignant neoplasm of liver and intrahepatic bile duct: Secondary | ICD-10-CM | POA: Diagnosis not present

## 2018-07-20 DIAGNOSIS — F329 Major depressive disorder, single episode, unspecified: Secondary | ICD-10-CM | POA: Diagnosis present

## 2018-07-20 DIAGNOSIS — H6692 Otitis media, unspecified, left ear: Secondary | ICD-10-CM | POA: Diagnosis not present

## 2018-07-20 DIAGNOSIS — K219 Gastro-esophageal reflux disease without esophagitis: Secondary | ICD-10-CM | POA: Diagnosis present

## 2018-07-20 DIAGNOSIS — F419 Anxiety disorder, unspecified: Secondary | ICD-10-CM | POA: Diagnosis present

## 2018-07-20 DIAGNOSIS — L308 Other specified dermatitis: Secondary | ICD-10-CM | POA: Diagnosis not present

## 2018-07-20 DIAGNOSIS — R634 Abnormal weight loss: Secondary | ICD-10-CM | POA: Diagnosis not present

## 2018-07-20 DIAGNOSIS — H60502 Unspecified acute noninfective otitis externa, left ear: Secondary | ICD-10-CM | POA: Diagnosis present

## 2018-07-20 DIAGNOSIS — Z923 Personal history of irradiation: Secondary | ICD-10-CM

## 2018-07-20 DIAGNOSIS — E7800 Pure hypercholesterolemia, unspecified: Secondary | ICD-10-CM | POA: Diagnosis not present

## 2018-07-20 DIAGNOSIS — C3490 Malignant neoplasm of unspecified part of unspecified bronchus or lung: Secondary | ICD-10-CM

## 2018-07-20 DIAGNOSIS — G40909 Epilepsy, unspecified, not intractable, without status epilepticus: Secondary | ICD-10-CM | POA: Diagnosis present

## 2018-07-20 DIAGNOSIS — R197 Diarrhea, unspecified: Secondary | ICD-10-CM

## 2018-07-20 DIAGNOSIS — H6090 Unspecified otitis externa, unspecified ear: Secondary | ICD-10-CM | POA: Diagnosis not present

## 2018-07-20 DIAGNOSIS — H938X3 Other specified disorders of ear, bilateral: Secondary | ICD-10-CM | POA: Diagnosis not present

## 2018-07-20 DIAGNOSIS — Z9049 Acquired absence of other specified parts of digestive tract: Secondary | ICD-10-CM

## 2018-07-20 DIAGNOSIS — B3781 Candidal esophagitis: Secondary | ICD-10-CM | POA: Diagnosis not present

## 2018-07-20 DIAGNOSIS — Z7951 Long term (current) use of inhaled steroids: Secondary | ICD-10-CM

## 2018-07-20 DIAGNOSIS — R69 Illness, unspecified: Secondary | ICD-10-CM | POA: Diagnosis not present

## 2018-07-20 DIAGNOSIS — I639 Cerebral infarction, unspecified: Secondary | ICD-10-CM | POA: Diagnosis not present

## 2018-07-20 DIAGNOSIS — C3492 Malignant neoplasm of unspecified part of left bronchus or lung: Secondary | ICD-10-CM | POA: Diagnosis not present

## 2018-07-20 DIAGNOSIS — H6022 Malignant otitis externa, left ear: Secondary | ICD-10-CM | POA: Diagnosis present

## 2018-07-20 DIAGNOSIS — Y842 Radiological procedure and radiotherapy as the cause of abnormal reaction of the patient, or of later complication, without mention of misadventure at the time of the procedure: Secondary | ICD-10-CM | POA: Diagnosis not present

## 2018-07-20 DIAGNOSIS — I10 Essential (primary) hypertension: Secondary | ICD-10-CM | POA: Diagnosis present

## 2018-07-20 DIAGNOSIS — L598 Other specified disorders of the skin and subcutaneous tissue related to radiation: Secondary | ICD-10-CM | POA: Diagnosis present

## 2018-07-20 DIAGNOSIS — A419 Sepsis, unspecified organism: Secondary | ICD-10-CM | POA: Diagnosis not present

## 2018-07-20 DIAGNOSIS — J438 Other emphysema: Secondary | ICD-10-CM | POA: Diagnosis not present

## 2018-07-20 DIAGNOSIS — Z79899 Other long term (current) drug therapy: Secondary | ICD-10-CM

## 2018-07-20 DIAGNOSIS — Z888 Allergy status to other drugs, medicaments and biological substances status: Secondary | ICD-10-CM

## 2018-07-20 DIAGNOSIS — R1310 Dysphagia, unspecified: Secondary | ICD-10-CM | POA: Diagnosis not present

## 2018-07-20 DIAGNOSIS — Z8673 Personal history of transient ischemic attack (TIA), and cerebral infarction without residual deficits: Secondary | ICD-10-CM

## 2018-07-20 DIAGNOSIS — Z87891 Personal history of nicotine dependence: Secondary | ICD-10-CM

## 2018-07-20 DIAGNOSIS — Z7952 Long term (current) use of systemic steroids: Secondary | ICD-10-CM

## 2018-07-20 LAB — CBC WITH DIFFERENTIAL/PLATELET
Abs Immature Granulocytes: 0.1 10*3/uL — ABNORMAL HIGH (ref 0.00–0.07)
BASOS ABS: 0 10*3/uL (ref 0.0–0.1)
Basophils Relative: 1 %
EOS PCT: 2 %
Eosinophils Absolute: 0.1 10*3/uL (ref 0.0–0.5)
HEMATOCRIT: 35.7 % — AB (ref 39.0–52.0)
HEMOGLOBIN: 11.3 g/dL — AB (ref 13.0–17.0)
IMMATURE GRANULOCYTES: 2 %
LYMPHS ABS: 0.2 10*3/uL — AB (ref 0.7–4.0)
LYMPHS PCT: 4 %
MCH: 29.4 pg (ref 26.0–34.0)
MCHC: 31.7 g/dL (ref 30.0–36.0)
MCV: 92.7 fL (ref 80.0–100.0)
Monocytes Absolute: 0.2 10*3/uL (ref 0.1–1.0)
Monocytes Relative: 5 %
NEUTROS ABS: 3.6 10*3/uL (ref 1.7–7.7)
NEUTROS PCT: 86 %
NRBC: 0 % (ref 0.0–0.2)
Platelets: 99 10*3/uL — ABNORMAL LOW (ref 150–400)
RBC: 3.85 MIL/uL — ABNORMAL LOW (ref 4.22–5.81)
RDW: 14.6 % (ref 11.5–15.5)
WBC: 4.2 10*3/uL (ref 4.0–10.5)

## 2018-07-20 LAB — COMPREHENSIVE METABOLIC PANEL
ALK PHOS: 48 U/L (ref 38–126)
ALT: 23 U/L (ref 0–44)
AST: 27 U/L (ref 15–41)
Albumin: 3.2 g/dL — ABNORMAL LOW (ref 3.5–5.0)
Anion gap: 6 (ref 5–15)
BUN: 6 mg/dL (ref 6–20)
CALCIUM: 8.1 mg/dL — AB (ref 8.9–10.3)
CO2: 21 mmol/L — AB (ref 22–32)
CREATININE: 0.83 mg/dL (ref 0.61–1.24)
Chloride: 108 mmol/L (ref 98–111)
GFR calc non Af Amer: >60 mL/min (ref >60)
GLUCOSE: 132 mg/dL — AB (ref 70–99)
Potassium: 3.7 mmol/L (ref 3.5–5.1)
Sodium: 135 mmol/L (ref 135–145)
Total Bilirubin: 0.6 mg/dL (ref 0.3–1.2)
Total Protein: 6.3 g/dL — ABNORMAL LOW (ref 6.5–8.1)

## 2018-07-20 LAB — I-STAT CG4 LACTIC ACID, ED: Lactic Acid, Venous: 1.24 mmol/L (ref 0.5–1.9)

## 2018-07-20 MED ORDER — FLUTICASONE PROPIONATE 50 MCG/ACT NA SUSP
2.0000 | Freq: Every day | NASAL | Status: DC | PRN
  Filled 2018-07-20: qty 16

## 2018-07-20 MED ORDER — ACETAMINOPHEN 325 MG PO TABS
650.0000 mg | ORAL_TABLET | Freq: Four times a day (QID) | ORAL | Status: DC | PRN
  Administered 2018-07-20: 650 mg via ORAL
  Filled 2018-07-20: qty 2

## 2018-07-20 MED ORDER — DOCUSATE SODIUM 100 MG PO CAPS
100.0000 mg | ORAL_CAPSULE | Freq: Two times a day (BID) | ORAL | Status: DC
  Filled 2018-07-20 (×5): qty 1

## 2018-07-20 MED ORDER — ONDANSETRON HCL 4 MG/2ML IJ SOLN
4.0000 mg | Freq: Once | INTRAMUSCULAR | Status: AC
  Administered 2018-07-20: 4 mg via INTRAVENOUS

## 2018-07-20 MED ORDER — LIDOCAINE VISCOUS HCL 2 % MT SOLN
15.0000 mL | OROMUCOSAL | Status: DC | PRN
  Filled 2018-07-20 (×2): qty 15

## 2018-07-20 MED ORDER — KETOROLAC TROMETHAMINE 30 MG/ML IJ SOLN
30.0000 mg | Freq: Once | INTRAMUSCULAR | Status: AC
  Administered 2018-07-21: 30 mg via INTRAVENOUS
  Filled 2018-07-20: qty 1

## 2018-07-20 MED ORDER — IOHEXOL 300 MG/ML  SOLN
75.0000 mL | Freq: Once | INTRAMUSCULAR | Status: AC | PRN
  Administered 2018-07-20: 75 mL via INTRAVENOUS

## 2018-07-20 MED ORDER — ORAL CARE MOUTH RINSE
15.0000 mL | Freq: Two times a day (BID) | OROMUCOSAL | Status: DC
  Administered 2018-07-21 – 2018-07-24 (×4): 15 mL via OROMUCOSAL

## 2018-07-20 MED ORDER — ONDANSETRON HCL 4 MG/2ML IJ SOLN
4.0000 mg | Freq: Once | INTRAMUSCULAR | Status: AC
  Administered 2018-07-20: 4 mg via INTRAVENOUS
  Filled 2018-07-20: qty 2

## 2018-07-20 MED ORDER — DICLOFENAC SODIUM 1 % TD GEL
2.0000 g | Freq: Four times a day (QID) | TRANSDERMAL | Status: DC
  Administered 2018-07-21 – 2018-07-24 (×10): 2 g via TOPICAL
  Filled 2018-07-20 (×2): qty 100

## 2018-07-20 MED ORDER — PANTOPRAZOLE SODIUM 40 MG PO TBEC
40.0000 mg | DELAYED_RELEASE_TABLET | Freq: Every day | ORAL | Status: DC
  Administered 2018-07-20 – 2018-07-24 (×5): 40 mg via ORAL
  Filled 2018-07-20 (×5): qty 1

## 2018-07-20 MED ORDER — HYDROMORPHONE HCL 1 MG/ML IJ SOLN: 1.0000 mg | INTRAMUSCULAR | Status: DC | PRN

## 2018-07-20 MED ORDER — CYCLOBENZAPRINE HCL 10 MG PO TABS: 10.0000 mg | ORAL_TABLET | Freq: Every day | ORAL | Status: DC | PRN

## 2018-07-20 MED ORDER — TAMSULOSIN HCL 0.4 MG PO CAPS
0.4000 mg | ORAL_CAPSULE | Freq: Every day | ORAL | Status: DC
  Administered 2018-07-20 – 2018-07-24 (×5): 0.4 mg via ORAL
  Filled 2018-07-20 (×6): qty 1

## 2018-07-20 MED ORDER — ALBUTEROL SULFATE (2.5 MG/3ML) 0.083% IN NEBU: 2.5000 mg | INHALATION_SOLUTION | Freq: Four times a day (QID) | RESPIRATORY_TRACT | Status: DC | PRN

## 2018-07-20 MED ORDER — SODIUM CHLORIDE 0.9% FLUSH
10.0000 mL | Freq: Once | INTRAVENOUS | Status: AC
  Administered 2018-07-20: 10 mL via INTRAVENOUS

## 2018-07-20 MED ORDER — ACETAMINOPHEN 650 MG RE SUPP: 650.0000 mg | Freq: Four times a day (QID) | RECTAL | Status: DC | PRN

## 2018-07-20 MED ORDER — FLUCONAZOLE 100 MG PO TABS
200.0000 mg | ORAL_TABLET | Freq: Every day | ORAL | Status: DC
  Administered 2018-07-20 – 2018-07-24 (×5): 200 mg via ORAL
  Filled 2018-07-20 (×6): qty 2

## 2018-07-20 MED ORDER — SODIUM CHLORIDE 0.9 % IV BOLUS
1000.0000 mL | Freq: Once | INTRAVENOUS | Status: AC
  Administered 2018-07-20: 1000 mL via INTRAVENOUS

## 2018-07-20 MED ORDER — ENSURE ENLIVE PO LIQD
237.0000 mL | Freq: Two times a day (BID) | ORAL | Status: DC
  Administered 2018-07-22 – 2018-07-23 (×2): 237 mL via ORAL

## 2018-07-20 MED ORDER — PIPERACILLIN-TAZOBACTAM 3.375 G IVPB 30 MIN
3.3750 g | Freq: Once | INTRAVENOUS | Status: AC
  Administered 2018-07-20: 3.375 g via INTRAVENOUS
  Filled 2018-07-20: qty 50

## 2018-07-20 MED ORDER — SUCRALFATE 1 G PO TABS
1.0000 g | ORAL_TABLET | Freq: Four times a day (QID) | ORAL | Status: DC
  Administered 2018-07-21 – 2018-07-25 (×16): 1 g via ORAL
  Filled 2018-07-20 (×16): qty 1

## 2018-07-20 MED ORDER — ONDANSETRON HCL 4 MG/2ML IJ SOLN
INTRAMUSCULAR | Status: AC
  Filled 2018-07-20: qty 2

## 2018-07-20 MED ORDER — HYDROMORPHONE HCL 1 MG/ML IJ SOLN
1.0000 mg | Freq: Once | INTRAMUSCULAR | Status: AC
  Administered 2018-07-20: 1 mg via INTRAVENOUS
  Filled 2018-07-20: qty 1

## 2018-07-20 MED ORDER — TEMAZEPAM 15 MG PO CAPS: 15.0000 mg | ORAL_CAPSULE | Freq: Every evening | ORAL | Status: DC | PRN

## 2018-07-20 MED ORDER — IPRATROPIUM BROMIDE 0.02 % IN SOLN
0.5000 mg | Freq: Once | RESPIRATORY_TRACT | Status: AC
  Administered 2018-07-20: 0.5 mg via RESPIRATORY_TRACT
  Filled 2018-07-20: qty 2.5

## 2018-07-20 MED ORDER — ALPRAZOLAM 0.5 MG PO TABS: 2.0000 mg | ORAL_TABLET | Freq: Four times a day (QID) | ORAL | Status: DC | PRN

## 2018-07-20 MED ORDER — FUROSEMIDE 20 MG PO TABS
10.0000 mg | ORAL_TABLET | Freq: Every day | ORAL | Status: DC
  Filled 2018-07-20 (×4): qty 1

## 2018-07-20 MED ORDER — ONDANSETRON HCL 4 MG/2ML IJ SOLN
4.0000 mg | Freq: Four times a day (QID) | INTRAMUSCULAR | Status: DC | PRN
  Administered 2018-07-21 – 2018-07-23 (×7): 4 mg via INTRAVENOUS
  Filled 2018-07-20 (×7): qty 2

## 2018-07-20 MED ORDER — MECLIZINE HCL 12.5 MG PO TABS
12.5000 mg | ORAL_TABLET | Freq: Three times a day (TID) | ORAL | Status: DC | PRN
  Filled 2018-07-20: qty 1

## 2018-07-20 MED ORDER — ONDANSETRON HCL 4 MG PO TABS: 4.0000 mg | ORAL_TABLET | Freq: Four times a day (QID) | ORAL | Status: DC | PRN

## 2018-07-20 MED ORDER — LEVETIRACETAM 500 MG PO TABS
500.0000 mg | ORAL_TABLET | Freq: Every day | ORAL | Status: DC
  Administered 2018-07-20 – 2018-07-24 (×5): 500 mg via ORAL
  Filled 2018-07-20 (×6): qty 1

## 2018-07-20 NOTE — ED Notes (Signed)
One person assist to restroom at this time. Patient had small bowel movement. Patient back in room having dry heaves at this time. Family at bedside. Spoke with RN Merry Proud about patient getting Zofran for his nausea.

## 2018-07-20 NOTE — ED Triage Notes (Signed)
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 Last tx 07/13/18. Has been having a problem with eating d/t pain in throat. Was seen for external otitis media and given PO and drop antibx with no improvement. Sent from [place] center d/t tachycardia, low grade fever, need for IV antibx, and head CT. Pt discussed with MD Melina Copa.

## 2018-07-20 NOTE — ED Provider Notes (Signed)
[facility] EMERGENCY DEPARTMENT Provider Note   CSN: [id number] Arrival date & time: 07/20/18  1559     History   Chief Complaint Chief Complaint  Patient presents with  . Otalgia    HPI [name] [name] is a 57 y.o. male.  HPI Patient presents with concern of left-sided ear and face pain. Patient was at the cancer center, was sent here for evaluation. He notes that he has had pain in his ear, face over the past week. He has been taking in a box, orally since that time, but has increasing pain, erythema, discomfort. No hearing loss. There is associated generalized discomfort, no obvious fever. Patient has metastatic lung cancer with metastases to the brain, bones. Last radiation therapy was 1 week ago.  Past Medical History:  Diagnosis Date  . Anxiety   . Asthma   . COPD (chronic obstructive pulmonary disease) ([place])   . DDD (degenerative disc disease)   . Diverticulitis   . GERD (gastroesophageal reflux disease)   . Headache   . Hepatitis    in the late 70's   . HTN (hypertension)   . Mediastinal adenopathy   . Seizures ([place])    as of 2019 none for 20 years  . Sleep apnea    does not  use cpap  . Stroke [facility])    TIA - Feb. 2018    Patient Active Problem List   Diagnosis Date Noted  . Bone metastasis ([place]) 01/29/2018  . Malignant neoplasm of bronchus and lung ([place])   . 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 Goals of care, counseling/discussion 11/23/2017  . Hyperlipidemia 01/03/2017  . Family history of heart disease 01/03/2017  . Blurry vision 10/26/2016  . Slurred speech 10/26/2016  . Cerebrovascular disease 10/26/2016  . Chest pain 05/10/2012  . HTN (hypertension) 05/10/2012  . Chronic pain 05/10/2012  . Diabetes mellitus ([place]) 05/10/2012  . Screening for colon cancer 07/25/2011    Past Surgical History:  Procedure Laterality Date  . CERVICAL DISC SURGERY     X4  . CHOLECYSTECTOMY    . COLONOSCOPY  08/24/2011    Procedure: COLONOSCOPY;  Surgeon: Daneil Dolin, MD;  Location: [facility] ENDO [facility];  Service: Endoscopy;  Laterality: N/A;  7:30AM  . ELBOW SURGERY     right  . PORTACATH PLACEMENT Right 12/01/2017   Procedure: INSERTION PORT-A-CATH;  Surgeon: Aviva Signs, MD;  Location: [facility] ORS;  Service: General;  Laterality: Right;  Marland Kitchen VIDEO BRONCHOSCOPY WITH ENDOBRONCHIAL ULTRASOUND N/A 10/31/2017   Procedure: VIDEO BRONCHOSCOPY WITH ENDOBRONCHIAL ULTRASOUND;  Surgeon: Ivin Poot, MD;  Location: [facility];  Service: Thoracic;  Laterality: N/A;  . VIDEO BRONCHOSCOPY WITH ENDOBRONCHIAL ULTRASOUND N/A 11/22/2017   Procedure: VIDEO BRONCHOSCOPY WITH ENDOBRONCHIAL ULTRASOUND;  Surgeon: Melrose Nakayama, MD;  Location: [place];  Service: Thoracic;  Laterality: N/A;        Home Medications    Prior to Admission medications   Medication Sig Start Date End Date Taking? Authorizing Provider  albuterol (PROVENTIL HFA;VENTOLIN HFA) 108 (90 BASE) MCG/ACT inhaler Inhale 2 puffs into the lungs every 6 (six) hours as needed for wheezing or shortness of breath.     [provider]  albuterol (PROVENTIL) (2.5 MG/3ML) 0.083% nebulizer solution Take 2.5 mg by nebulization every 6 (six) hours as needed for wheezing or shortness of breath.     [provider]  alprazolam Duanne Moron) 2 MG tablet Take 2 mg by mouth every 6 (six)  hours as needed for sleep or anxiety.     [provider]  amLODipine (NORVASC) 10 MG tablet Take 10 mg by mouth daily.    [provider]  CALCIUM-MAGNESIUM-ZINC PO Take 1,200 mg by mouth daily.    [provider]  cephALEXin (KEFLEX) 500 MG capsule  07/09/18   [provider]  COLLAGEN PO Take 7 g by mouth daily.    [provider]  cyclobenzaprine (FLEXERIL) 10 MG tablet Take 10 mg by mouth daily as needed for muscle spasms.  07/11/16   [provider]  diclofenac sodium (VOLTAREN) 1 % GEL Apply 2 g topically 4 (four) times daily.  Apply to left forearm phlebitic area 01/08/18   Derek Jack, MD  docusate sodium (COLACE) 100 MG capsule Take 1 capsule (100 mg total) by mouth every 12 (twelve) hours. 11/27/17   Mesner, Corene Cornea, MD  Flaxseed, Linseed, (FLAXSEED OIL PO) Take 1 tablet by mouth 2 (two) times daily.     [provider]  fluconazole (DIFLUCAN) 200 MG tablet Take 1 tablet (200 mg total) by mouth daily. 07/17/18   Lockamy, Randi L, NP-C  fluticasone (FLONASE) 50 MCG/ACT nasal spray Place 2 sprays into both nostrils daily as needed for allergies.  07/11/16   [provider]  furosemide (LASIX) 20 MG tablet Take 10 mg by mouth daily.  04/10/18   [provider]  Glucosamine-Chondroit-Vit C-Mn (GLUCOSAMINE 1500 COMPLEX) CAPS Take 1 capsule by mouth.    [provider]  Ipilimumab (YERVOY IV) Inject into the vein.    [provider]  levETIRAcetam (KEPPRA) 500 MG tablet Take 500 mg by mouth daily.  11/29/16   [provider]  lidocaine (XYLOCAINE) 2 % solution Use as directed 15 mLs in the mouth or throat every 4 (four) hours as needed for mouth pain. 07/17/18   Derek Jack, MD  lidocaine-prilocaine (EMLA) cream APPLY TO PORTACATH SITE AS DIRECTED 01/22/18   Derek Jack, MD  lisinopril (PRINIVIL,ZESTRIL) 20 MG tablet Take 20 mg by mouth 2 (two) times daily.    [provider]  meclizine (ANTIVERT) 12.5 MG tablet Take 1 tablet (12.5 mg total) by mouth 3 (three) times daily as needed for dizziness. 10/28/16   Florencia Reasons, MD  Morphine Sulfate (MORPHINE CONCENTRATE) 10 mg / 0.5 ml concentrated solution Take 0.5 mLs (10 mg total) by mouth every 6 (six) hours as needed for severe pain. 07/16/18   Lockamy, Randi L, NP-C  MULTIPLE VITAMINS PO Take 1 tablet by mouth daily.     [provider]  mupirocin ointment (BACTROBAN) 2 % APPLY TO SKIN TID PRN 02/15/18   [provider]  naproxen (NAPROSYN) 500 MG tablet Take 500 mg by mouth 2 (two)  times daily with a meal.      [provider]  Nivolumab (OPDIVO IV) Inject into the vein.    [provider]  oxyCODONE (ROXICODONE) 15 MG immediate release tablet Take 15 mg by mouth 4 (four) times daily as needed for pain.     [provider]  pantoprazole (PROTONIX) 40 MG tablet Take 40 mg by mouth at bedtime.  02/09/18   [provider]  polyethylene glycol (MIRALAX / GLYCOLAX) packet Take 17 g by mouth daily. 11/27/17   Mesner, Corene Cornea, MD  pravastatin (PRAVACHOL) 20 MG tablet Take 20 mg by mouth daily.  02/09/18   [provider]  predniSONE (DELTASONE) 10 MG tablet Take 1 tablet (10 mg total) by mouth daily with  breakfast. 04/27/18   Lockamy, Randi L, NP-C  prochlorperazine (COMPAZINE) 10 MG tablet Take 1 tablet (10 mg total) by mouth every 6 (six) hours as needed for nausea or vomiting. 07/13/18   Lockamy, Randi L, NP-C  sucralfate (CARAFATE) 1 g tablet Take 1 tablet (1 g total) by mouth 4 (four) times daily. 07/12/18   Kyung Rudd, MD  tamsulosin (FLOMAX) 0.4 MG CAPS capsule Take 0.4 mg by mouth daily.  07/11/16   [provider]  temazepam (RESTORIL) 30 MG capsule TAKE 1 CAPSULE AT BEDTIME AS NEEDED FOR SLEEP 07/11/18   Lockamy, Randi L, NP-C  triamcinolone cream (KENALOG) 0.1 % Apply 1 application topically 2 (two) times daily.  04/23/18   [provider]  TURMERIC PO Take 4 tablets by mouth daily.     [provider]    Family History Family History  Problem Relation Age of Onset  . Hypertension Father   . Heart attack Father   . Kidney failure Mother   . Diabetes Mother   . Hypertension Mother   . Colon cancer Neg Hx   . Anesthesia problems Neg Hx   . Hypotension Neg Hx   . Malignant hyperthermia Neg Hx   . Pseudochol deficiency Neg Hx     Social History Social History   Tobacco Use  . Smoking status: Former Smoker    Packs/day: 1.50    Years: 30.00    Pack years: 45.00    Types: Cigarettes    Last  attempt to quit: 10/13/2017    Years since quitting: 0.7  . Smokeless tobacco: Never Used  Substance Use Topics  . Alcohol use: No  . Drug use: No     Allergies   Xgeva [denosumab] and Tecentriq [atezolizumab]   Review of Systems Review of Systems  Constitutional:       Per HPI, otherwise negative  HENT:       Per HPI, otherwise negative  Respiratory:       Per HPI, otherwise negative  Cardiovascular:       Per HPI, otherwise negative  Gastrointestinal: Negative for vomiting.  Endocrine:       Negative aside from HPI  Genitourinary:       Neg aside from HPI   Musculoskeletal:       Per HPI, otherwise negative  Skin: Positive for color change.  Allergic/Immunologic: Positive for immunocompromised state.  Neurological: Negative for syncope.     Physical Exam Updated Vital Signs BP 124/63   Pulse 99   Temp 98.9 F (37.2 C)   Resp 18   Ht 5\' 7"  (1.702 m)   Wt 81.7 kg   SpO2 94%   BMI 28.22 kg/m   Physical Exam  Constitutional: He is oriented to person, place, and time. He appears well-developed. No distress.  HENT:  Head: Normocephalic.  Left Ear: Tympanic membrane normal. There is swelling and tenderness. There is mastoid tenderness.  Ears:  Eyes: Conjunctivae and EOM are normal.  Cardiovascular: Regular rhythm. Tachycardia present.  Pulmonary/Chest: Effort normal. No stridor. No respiratory distress.  Abdominal: He exhibits no distension.  Musculoskeletal: He exhibits no edema.  Neurological: He is alert and oriented to person, place, and time.  Skin: Skin is warm and dry.  Psychiatric: He has a normal mood and affect.  Nursing note and vitals reviewed.    ED Treatments / Results  Labs (all labs ordered are listed, but only abnormal results are displayed) Labs Reviewed  COMPREHENSIVE METABOLIC PANEL -  Abnormal; Notable for the following components:      Result Value   CO2 21 (*)    Glucose, Bld 132 (*)    Calcium 8.1 (*)    Total Protein 6.3  (*)    Albumin 3.2 (*)    All other components within normal limits  CBC WITH DIFFERENTIAL/PLATELET - Abnormal; Notable for the following components:   RBC 3.85 (*)    Hemoglobin 11.3 (*)    HCT 35.7 (*)    Platelets 99 (*)    Lymphs Abs 0.2 (*)    Abs Immature Granulocytes 0.10 (*)    All other components within normal limits  I-STAT CG4 LACTIC ACID, ED  I-STAT CG4 LACTIC ACID, ED    EKG None  Radiology Ct Head W Or Wo Contrast  Result Date: 07/20/2018 CLINICAL DATA:  Tachycardia, low-grade fever with throat pain. Recent diagnosis of otitis media. Assess for mastoiditis and follow-up intracranial metastasis. History of metastatic small cell lung cancer, hypertension, seizures and stroke. EXAM: CT HEAD WITHOUT AND WITH CONTRAST TECHNIQUE: Contiguous axial images were obtained from the base of the skull through the vertex without and with intravenous contrast CONTRAST:  60mL OMNIPAQUE IOHEXOL 300 MG/ML  SOLN COMPARISON:  MRI of the head June 28, 2018 FINDINGS: BRAIN: No intraparenchymal hemorrhage, mass effect nor midline shift. Mild parenchymal brain volume loss. No hydrocephalus. No acute large vascular territory infarcts. No abnormal extra-axial fluid collections. Basal cisterns are patent. No enhancing intracranial metastasis though, likely below the contrast resolution of CT. VASCULAR: Unremarkable. SKULL/SOFT TISSUES: No skull fracture. No significant soft tissue swelling. ORBITS/SINUSES: The included ocular globes and orbital contents are normal.Trace paranasal sinus mucosal thickening. LEFT middle ear effusion, incompletely evaluated. Soft tissue effacing LEFT external auditory canal. Mastoid air cells are well aerated. Status post RIGHT ocular lens implant. OTHER: None. IMPRESSION: 1. LEFT middle ear effusion and LEFT external auditory canal soft tissue effacement. No CT findings of mastoiditis. 2. No acute intracranial process. Mild parenchymal brain volume loss for age. 3. Known  intracranial metastasis not identified by CT, likely technical. Electronically Signed   By: Elon Alas M.D.   On: 07/20/2018 18:35    Procedures Procedures (including critical care time)  Medications Ordered in ED Medications  piperacillin-tazobactam (ZOSYN) IVPB 3.375 g (has no administration in time range)  ondansetron (ZOFRAN) injection 4 mg (has no administration in time range)  sodium chloride 0.9 % bolus 1,000 mL (0 mLs Intravenous Stopped 07/20/18 1829)  HYDROmorphone (DILAUDID) injection 1 mg (1 mg Intravenous Given 07/20/18 1700)  ondansetron (ZOFRAN) injection 4 mg (4 mg Intravenous Given 07/20/18 1717)  iohexol (OMNIPAQUE) 300 MG/ML solution 75 mL (75 mLs Intravenous Contrast Given 07/20/18 1808)     Initial Impression / Assessment and Plan / ED Course  I have reviewed the triage vital signs and the nursing notes.  Pertinent labs & imaging results that were available during my care of the patient were reviewed by me and considered in my medical decision making (see chart for details).     7:49 PM Patient has additional patient has additional nausea, pain. CT reassuring, no evidence for abscess, will give concern for otitis externa, lack of improvement with oral antibiotics, immunocompetent state, the patient will require admission.  When he will receive IV Zosyn Fluids have resulted in improved blood pressure, diminished heart rate, in the emergency department.   Final Clinical Impressions(s) / ED Diagnoses  Otitis externa   Carmin Muskrat, MD 07/20/18 1949

## 2018-07-20 NOTE — Progress Notes (Signed)
1400 patient presented today for hydration and nausea management. Patient complaining of headache mainly on the left  Complaints of nausea, fever and chills throughout the night, and today. IV zofran given per MD. Pt complains of more trouble breathing off/on throughout the night, wheezing present,  had to use his oxygen some. Complaints of feeling more swollen in his head and facial area, highest fever at home was 102.6.   Patient did state he was able to eat some eggs today, drink about 16 oz of coffee and ensure.   1530-MD room to room for evaluation. Examined pt and will need to transfer care to the ED for further care over the weekend.     1600- report given to Josiah Lobo RN. Trasported down to the Emergency room for further evaluation and iv antibiotics . VSS, trasported via patients on wheelchair with wife at his side.

## 2018-07-21 DIAGNOSIS — A419 Sepsis, unspecified organism: Principal | ICD-10-CM

## 2018-07-21 DIAGNOSIS — R197 Diarrhea, unspecified: Secondary | ICD-10-CM

## 2018-07-21 DIAGNOSIS — H6692 Otitis media, unspecified, left ear: Secondary | ICD-10-CM

## 2018-07-21 DIAGNOSIS — K219 Gastro-esophageal reflux disease without esophagitis: Secondary | ICD-10-CM | POA: Diagnosis present

## 2018-07-21 DIAGNOSIS — J449 Chronic obstructive pulmonary disease, unspecified: Secondary | ICD-10-CM

## 2018-07-21 DIAGNOSIS — C3490 Malignant neoplasm of unspecified part of unspecified bronchus or lung: Secondary | ICD-10-CM

## 2018-07-21 DIAGNOSIS — F419 Anxiety disorder, unspecified: Secondary | ICD-10-CM | POA: Diagnosis present

## 2018-07-21 DIAGNOSIS — J438 Other emphysema: Secondary | ICD-10-CM

## 2018-07-21 DIAGNOSIS — E7800 Pure hypercholesterolemia, unspecified: Secondary | ICD-10-CM

## 2018-07-21 DIAGNOSIS — I10 Essential (primary) hypertension: Secondary | ICD-10-CM

## 2018-07-21 LAB — BASIC METABOLIC PANEL
ANION GAP: 9 (ref 5–15)
BUN: 12 mg/dL (ref 6–20)
CO2: 19 mmol/L — ABNORMAL LOW (ref 22–32)
CREATININE: 1.06 mg/dL (ref 0.61–1.24)
Calcium: 7.3 mg/dL — ABNORMAL LOW (ref 8.9–10.3)
Chloride: 108 mmol/L (ref 98–111)
GFR calc non Af Amer: >60 mL/min (ref >60)
Glucose, Bld: 119 mg/dL — ABNORMAL HIGH (ref 70–99)
Potassium: 3.4 mmol/L — ABNORMAL LOW (ref 3.5–5.1)
Sodium: 136 mmol/L (ref 135–145)

## 2018-07-21 LAB — CBC WITH DIFFERENTIAL/PLATELET
Abs Immature Granulocytes: 0.06 10*3/uL (ref 0.00–0.07)
BASOS ABS: 0 10*3/uL (ref 0.0–0.1)
Basophils Relative: 0 %
EOS ABS: 0 10*3/uL (ref 0.0–0.5)
EOS PCT: 1 %
HCT: 32.8 % — ABNORMAL LOW (ref 39.0–52.0)
HEMOGLOBIN: 10.7 g/dL — AB (ref 13.0–17.0)
Immature Granulocytes: 1 %
LYMPHS PCT: 3 %
Lymphs Abs: 0.2 10*3/uL — ABNORMAL LOW (ref 0.7–4.0)
MCH: 30.7 pg (ref 26.0–34.0)
MCHC: 32.6 g/dL (ref 30.0–36.0)
MCV: 94.3 fL (ref 80.0–100.0)
Monocytes Absolute: 0.1 10*3/uL (ref 0.1–1.0)
Monocytes Relative: 2 %
Neutro Abs: 4.8 10*3/uL (ref 1.7–7.7)
Neutrophils Relative %: 93 %
Platelets: 78 10*3/uL — ABNORMAL LOW (ref 150–400)
RBC: 3.48 MIL/uL — AB (ref 4.22–5.81)
RDW: 15 % (ref 11.5–15.5)
WBC: 5.2 10*3/uL (ref 4.0–10.5)
nRBC: 0 % (ref 0.0–0.2)

## 2018-07-21 LAB — PROCALCITONIN: Procalcitonin: 6.31 ng/mL

## 2018-07-21 LAB — C DIFFICILE QUICK SCREEN W PCR REFLEX
C Diff antigen: NEGATIVE
C Diff interpretation: NOT DETECTED
C Diff toxin: NEGATIVE

## 2018-07-21 MED ORDER — KETOROLAC TROMETHAMINE 30 MG/ML IJ SOLN
30.0000 mg | Freq: Four times a day (QID) | INTRAMUSCULAR | Status: AC
  Administered 2018-07-21 – 2018-07-22 (×4): 30 mg via INTRAVENOUS
  Filled 2018-07-21 (×4): qty 1

## 2018-07-21 MED ORDER — TRIAMCINOLONE ACETONIDE 0.1 % EX CREA
TOPICAL_CREAM | Freq: Two times a day (BID) | CUTANEOUS | Status: DC
  Administered 2018-07-21 – 2018-07-22 (×4): via TOPICAL
  Administered 2018-07-23: 1 via TOPICAL
  Administered 2018-07-24 (×2): via TOPICAL
  Filled 2018-07-21: qty 15

## 2018-07-21 MED ORDER — SODIUM CHLORIDE 0.9% FLUSH
10.0000 mL | INTRAVENOUS | Status: DC | PRN
  Administered 2018-07-25: 20 mL
  Filled 2018-07-21: qty 40

## 2018-07-21 MED ORDER — PIPERACILLIN-TAZOBACTAM 3.375 G IVPB
3.3750 g | Freq: Three times a day (TID) | INTRAVENOUS | Status: DC
  Administered 2018-07-21 – 2018-07-24 (×11): 3.375 g via INTRAVENOUS
  Filled 2018-07-21 (×9): qty 50

## 2018-07-21 MED ORDER — SODIUM CHLORIDE 0.9 % IV BOLUS
500.0000 mL | Freq: Once | INTRAVENOUS | Status: AC
  Administered 2018-07-21: 500 mL via INTRAVENOUS

## 2018-07-21 MED ORDER — POTASSIUM CHLORIDE IN NACL 20-0.9 MEQ/L-% IV SOLN
INTRAVENOUS | Status: DC
  Administered 2018-07-21 – 2018-07-24 (×5): via INTRAVENOUS
  Filled 2018-07-21 (×4): qty 1000

## 2018-07-21 MED ORDER — VANCOMYCIN HCL 10 G IV SOLR
1500.0000 mg | Freq: Once | INTRAVENOUS | Status: AC
  Administered 2018-07-21: 1500 mg via INTRAVENOUS
  Filled 2018-07-21: qty 1500

## 2018-07-21 MED ORDER — VANCOMYCIN HCL IN DEXTROSE 1-5 GM/200ML-% IV SOLN
1000.0000 mg | Freq: Two times a day (BID) | INTRAVENOUS | Status: DC
  Administered 2018-07-21 – 2018-07-22 (×3): 1000 mg via INTRAVENOUS
  Filled 2018-07-21 (×4): qty 200

## 2018-07-21 NOTE — Progress Notes (Addendum)
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  Patient went to see his primary care provider approximately 2 weeks prior to this admission and was placed on cephalexin initially.  There was no clinical improvement.  He went back to see his provider on 07/17/2018.  He was placed on Cipro.  The patient continued to have left ear pain and left facial pain.  He had otorrhea and some loss of hearing.  In addition, the patient has developed fevers and chills over the last 2 to 3 days.  The patient has also has some associated dizziness, but denies any falling or syncope.  As a result of his persistent symptoms he presented for further evaluation.  In addition, the patient has been having diarrhea for the past 2 to 3 days.  He states he has been having at least 3-4 loose bowel movements daily without any hematochezia or melena.  He has had increasing abdominal cramping.  He states that for the past month he has had difficulty with oral intake secondary to what is likely to be radiation-induced esophagitis.  He has been taking oral lidocaine and Roxanol to help with his pain.  Intermittently, he is able to tolerate liquids, but he is unable to tolerate any solid food.  Since admission, the patient had a fever up to 102.8 F, but he remains hemodynamically stable saturating 95% on room air.  The patient was started on Zosyn.  The case was discussed with otolaryngology, Dr. Melissa Montane.  He graciously agreed to see the patient in consult after transfer to [facility].   Assessment/Plan: Sepsis -Present at the time of admission -Patient presenting with fever, tachycardia -Likely secondary to inner ear infection,  but cannot rule out Clostridium difficile infection or bacteremia -Continue Zosyn -Add vancomycin -Blood cultures x2 sets -Lactic acid 1.24 -Check procalcitonin  Malignant otitis media -07/20/2018 CT brain left middle ear effusion with left external auditory canal soft tissue effacement -Case discussed with otolaryngology, Dr. Bonney Roussel to see in consult after transfer to [place] vancomycin  Diarrhea -Check C. 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 Redge Gainer at [facility] and also at [facility] -last immunotherapy (ipilimumab+nivolumab) on 07/13/18 -06/04/2018 CT angiogram chest--negative for PE, but showed progression of disease -Finished whole brain radiation and chest and pelvic radiation 07/13/2018 -Continue Keppra for seizure prophylaxis  COPD -Stable on room air  Essential hypertension -Holding lisinopril and amlodipine for soft blood pressures  Anxiety/depression -Continue home dose alprazolam    Disposition Plan:   Transfer to [facility]  Family Communication:  No Family at bedside  Consultants:  ENT--Dr. Melissa Montane  Code Status:  FULL   DVT Prophylaxis:   [place] Lovenox  -Total time spent 45 minutes.  Greater than 50% spent face to face counseling and coordinating care. 0730 to 0815    Procedures: As Listed in Progress Note Above  Antibiotics: vanco 11/9>>> Zosyn 11/8>>>       Subjective: Patient continues to have left ear pain and otorrhea and left facial pain.  He  states that his shortness of breath is about the same as usual.  He continues to have loose stools without hematochezia or melena.  He has abdominal cramping which is mild to moderate in nature.  He denies any headache, neck pain.  He has had some  fevers and chills.  Objective: Vitals:   07/20/18 2110 07/20/18 2349 07/21/18 0127 07/21/18 0700  BP: (!) 144/72 107/68  130/70  Pulse: (!) 118 (!) 102  100  Resp: 18 18  18   Temp: (!) 102 F (38.9 C) (!) 102.8 F (39.3 C) (!) 100.4 F (38 C) 99.8 F (37.7 C)  TempSrc: Oral Oral Oral Oral  SpO2: 98% 100%  100%  Weight: 82.1 kg     Height: 5\' 7"  (1.702 m)       Intake/Output Summary (Last 24 hours) at 07/21/2018 0749 Last data filed at 07/21/2018 0600 Gross per 24 hour  Intake 1544.6 ml  Output -  Net 1544.6 ml   Weight change:  Exam:   General:  Pt is alert, follows commands appropriately, not in acute distress  HEENT: No icterus, No thrush.  Unable to visualize left tympanic membrane.  Left EAC without erythema.  Tenderness to palpation of the left mastoid and TMJ area.  Cardiovascular: RRR, S1/S2, no rubs, no gallops  Respiratory: Diminished breath sounds bilateral.  Bibasilar rales.  Abdomen: Soft/+BS, non tender, non distended, no guarding  Extremities: No edema, No lymphangitis, No petechiae, No rashes, no synovitis   Data Reviewed: I have personally reviewed following labs and imaging studies Basic Metabolic Panel: Recent Labs  Lab 07/16/18 1235 07/20/18 1644 07/21/18 0710  NA 135 135 136  K 3.5 3.7 3.4*  CL 104 108 108  CO2 18* 21* 19*  GLUCOSE 110* 132* 119*  BUN 15 6 12   CREATININE 0.86 0.83 1.06  CALCIUM 8.0* 8.1* 7.3*  MG 2.1  --   --    Liver Function Tests: Recent Labs  Lab 07/20/18 1644  AST 27  ALT 23  ALKPHOS 48  BILITOT 0.6  PROT 6.3*  ALBUMIN 3.2*   No results for input(s): LIPASE, AMYLASE in the last 168 hours. No results for input(s): AMMONIA in the last 168 hours. Coagulation Profile: No results for input(s): INR, PROTIME in the last 168 hours. CBC: Recent Labs  Lab 07/20/18 1644  WBC 4.2  NEUTROABS 3.6  HGB 11.3*  HCT 35.7*  MCV 92.7  PLT 99*   Cardiac Enzymes: No results for input(s): CKTOTAL, CKMB,  CKMBINDEX, TROPONINI in the last 168 hours. BNP: Invalid input(s): POCBNP CBG: No results for input(s): GLUCAP in the last 168 hours. HbA1C: No results for input(s): HGBA1C in the last 72 hours. Urine analysis:    Component Value Date/Time   COLORURINE YELLOW 07/03/2018 0017   APPEARANCEUR CLEAR 07/03/2018 0017   LABSPEC 1.026 07/03/2018 0017   PHURINE 5.0 07/03/2018 0017   GLUCOSEU NEGATIVE 07/03/2018 0017   HGBUR NEGATIVE 07/03/2018 0017   BILIRUBINUR NEGATIVE 07/03/2018 0017   KETONESUR NEGATIVE 07/03/2018 0017   PROTEINUR NEGATIVE 07/03/2018 0017   NITRITE NEGATIVE 07/03/2018 0017   LEUKOCYTESUR NEGATIVE 07/03/2018 0017   Sepsis Labs: @LABRCNTIP (procalcitonin:4,lacticidven:4) )No results found for this or any previous visit (from the past 240 hour(s)).   Scheduled Meds: . diclofenac sodium  2 g Topical QID  . docusate sodium  100 mg Oral Q12H  . feeding supplement (ENSURE ENLIVE)  237 mL Oral BID BM  . fluconazole  200 mg Oral Daily  . furosemide  10 mg Oral Daily  . ketorolac  30 mg Intravenous Q6H  . levETIRAcetam  500 mg Oral Daily  . mouth rinse  15 mL Mouth Rinse BID  . pantoprazole  40 mg Oral QHS  . sucralfate  1 g Oral QID  . tamsulosin  0.4 mg Oral Daily   Continuous Infusions: . piperacillin-tazobactam (ZOSYN)  IV 3.375 g (07/21/18 0319)    Procedures/Studies: Dg Chest 2 View  Result Date: 07/03/2018 CLINICAL DATA:  Cough, fever EXAM: CHEST - 2 VIEW COMPARISON:  05/25/2018.  Chest CT 06/04/2018. FINDINGS: Right Port-A-Cath remains in place, unchanged. Heart and mediastinal contours are within normal limits. Unable to visualize the previously seen left hilar and AP window adenopathy on prior CT. No focal opacities or effusions. No acute bony abnormality. IMPRESSION: No active cardiopulmonary disease. Electronically Signed   By: Rolm Baptise M.D.   On: 07/03/2018 01:19   Ct Head W Or Wo Contrast  Result Date: 07/20/2018 CLINICAL DATA:  Tachycardia,  low-grade fever with throat pain. Recent diagnosis of otitis media. Assess for mastoiditis and follow-up intracranial metastasis. History of metastatic small cell lung cancer, hypertension, seizures and stroke. EXAM: CT HEAD WITHOUT AND WITH CONTRAST TECHNIQUE: Contiguous axial images were obtained from the base of the skull through the vertex without and with intravenous contrast CONTRAST:  68mL OMNIPAQUE IOHEXOL 300 MG/ML  SOLN COMPARISON:  MRI of the head June 28, 2018 FINDINGS: BRAIN: No intraparenchymal hemorrhage, mass effect nor midline shift. Mild parenchymal brain volume loss. No hydrocephalus. No acute large vascular territory infarcts. No abnormal extra-axial fluid collections. Basal cisterns are patent. No enhancing intracranial metastasis though, likely below the contrast resolution of CT. VASCULAR: Unremarkable. SKULL/SOFT TISSUES: No skull fracture. No significant soft tissue swelling. ORBITS/SINUSES: The included ocular globes and orbital contents are normal.Trace paranasal sinus mucosal thickening. LEFT middle ear effusion, incompletely evaluated. Soft tissue effacing LEFT external auditory canal. Mastoid air cells are well aerated. Status post RIGHT ocular lens implant. OTHER: None. IMPRESSION: 1. LEFT middle ear effusion and LEFT external auditory canal soft tissue effacement. No CT findings of mastoiditis. 2. No acute intracranial process. Mild parenchymal brain volume loss for age. 3. 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 Electronically Signed   By: Elon Alas M.D.   On: 07/20/2018 18:35   Mr Jeri Cos DU Contrast  Result Date: 06/28/2018 CLINICAL DATA:  57 year old male with small cell carcinoma. Disease progression in the chest last month. Difficulty talking, weakness. No prior whole brain radiation. EXAM: MRI HEAD WITHOUT AND WITH CONTRAST TECHNIQUE: Multiplanar, multiecho pulse sequences of the brain and surrounding structures were obtained  without and with intravenous contrast. CONTRAST:  10 milliliters Gadavist COMPARISON:  Brain MRI 04/04/2018 and earlier. FINDINGS: Brain: There are four small round foci of abnormal diffusion identified in the brain today including both cerebral hemispheres and 1 area in the right cerebellum. Two of these appear restricted on ADC while 2 are not. Small round T2 hyperintense corresponding lesions are identified on series 7, and each lesion enhances to a varying degree. In particular, the right cerebellar lesion on series 12, image 27 and the left middle frontal gyrus lesion on image 72 have both T2 signal and enhancement which is most compatible with small metastases. A right middle frontal gyrus lesion is only faintly evident postcontrast seen best on series 12, image 76. A left superior frontal gyrus lesion is best seen on series 13, image 13. Minimal associated cerebral edema at the cerebellar lesion.  No mass effect. A 5th small round enhancing lesion without obvious diffusion abnormality is seen at the left inferior frontal gyrus on series 14, image 15. No dural thickening or leptomeningeal enhancement. No convincing restricted diffusion suggestive of acute infarction. No midline shift, mass effect, ventriculomegaly, extra-axial collection or acute intracranial hemorrhage. Cervicomedullary junction and pituitary are within normal limits. Background gray and white matter signal remains normal. No chronic cerebral blood products. Vascular: Major intracranial vascular flow voids are stable and within normal limits. Major dural venous sinuses are enhancing and appear patent. Skull and upper cervical spine: Partially visible cervical ACDF. Negative visible cervical spinal cord. No enhancing or destructive skull lesion identified. Sinuses/Orbits: Stable and negative. Other: Mastoids remain clear. Visible internal auditory structures appear normal. Scalp and face soft tissues appear negative. IMPRESSION: 1. Five new  small round enhancing brain lesions are most compatible with small Brain Metastases. Minimal edema and no mass effect at this time. All lesions are annotated on axial series 12, although 2 lesions are better demonstrated on the sagittal and coronal postcontrast images. 2. No other acute intracranial abnormality. These results will be called to the ordering clinician or representative by the Radiologist Assistant, and communication documented in the PACS or zVision Dashboard. Electronically Signed   By: Genevie Ann M.D.   On: 06/28/2018 08:48    Orson Eva, DO  [facility] Pager [phone]  If 7PM-7AM, please contact night-coverage [email or web address] Password [email or web address] 07/21/2018, 7:49 AM   LOS: 1 day

## 2018-07-21 NOTE — Progress Notes (Signed)
Pharmacy Antibiotic Note  [name] [name] is a 57 y.o. male admitted on 07/20/2018 with sepsis from malignant otitis media infection.  Pharmacy has been consulted for Vancomycin dosing.  Plan: Vancomycin 1500mg  IV loading dose, then 1gm  IV every 12 hours.  Goal trough 15-20 mcg/mL.  Also on Zosyn 3.375gm IV q8h EID over 4 hours F/U cxs and clinical progress Monitor V/S, labs and levels as indicated  Height: 5\' 7"  (170.2 cm) Weight: 180 lb 14.4 oz (82.1 kg) IBW/kg (Calculated) : 66.1  Temp (24hrs), Avg:100.5 F (38.1 C), Min:98.9 F (37.2 C), Max:102.8 F (39.3 C)  Recent Labs  Lab 07/16/18 1235 07/20/18 1644 07/20/18 1720 07/21/18 0710  WBC  --  4.2  --  5.2  CREATININE 0.86 0.83  --  1.06  LATICACIDVEN  --   --  1.24  --     Estimated Creatinine Clearance: 76.9 mL/min (by C-G formula based on SCr of 1.06 mg/dL).    Allergies  Allergen Reactions  . Xgeva [Denosumab]     Rash   . Tecentriq [Atezolizumab] Rash    Antimicrobials this admission: Vancomycin 11/9 >>  Zosyn 11/9 >>   Dose adjustments this admission: n/a  Microbiology results: 11/9 BCx: pending  Thank you for allowing pharmacy to be a part of this patient's care.  Isac Sarna, BS Pharm D, [place] Clinical Pharmacist Pager [phone] 07/21/2018 8:28 AM

## 2018-07-21 NOTE — H&P (Signed)
History and Physical    [name] [name] JYN:[id number] DOB: [birth year] DOA: 07/20/2018  PCP: Redmond School, MD   Patient coming from:   Chief Complaint: Left ear infection.  HPI: [name] [name] is a 57 y.o. male with medical history significant of anxiety, asthma, COPD, DDD (degenerative disc disease), diverticulitis, GERD, headache, hepatitis, hypertension, mediastinal adenopathy, seizures, sleep apnea, stroke who was referred from the cancer center after failing outpatient treatment for left otitis externa with oral antibiotics x2.   Per patient, he started having an earache with drainage of fluid from her left ear canal about 2 weeks ago.  He believes that this is related to the recent radiotherapy for brain mets.  He has been having persistent tinnitus, fever and chills. He has felt lightheaded/dizzy at times. He had a sore throat earlier in the process, but it is not as intense at this time.  He has been fatigued and malaised at times.  He has been having trouble eating due to sore throat.  No wheezing, hemoptysis or dyspnea.  No chest pain, palpitations, diaphoresis, PND orthopnea.  No abdominal pain, nausea, emesis, diarrhea, constipation, melena or hematochezia.  No dysuria, frequency or hematuria.  Denies heat or cold intolerance.    ED Course: BP 124/63   Pulse 99   Temp 98.9 F (37.2 C)   Resp 18   Ht 5\' 7"  (1.702 m)   Wt 81.7 kg   SpO2 94%   BMI 28.22 kg/m .  He was given Zosyn and analgesics in the emergency department.  His white count was 4.2, hemoglobin 11.3 g/dL platelets 99.  CO2 was 21 mmol/L.  Glucose was 132 and calcium 8.1 mg/dL.  However total protein was 6.3 and albumin was decreased to 3.2 g/dL.  Lactic acid was normal.    Imaging: CT head with and without contrast showed left middle ear effusion and left external auditory canal soft tissue effacement.  There was no mastoiditis.  There was no acute intracranial pathology.  Mild parenchymal brain volume loss for  age was seen.  No intracranial metastases were not identified by CT.  This is likely technical.  Review of Systems: As per HPI otherwise 10 point review of systems negative.   Past Medical History:  Diagnosis Date  . Anxiety   . Asthma   . COPD (chronic obstructive pulmonary disease) ([place])   . DDD (degenerative disc disease)   . Diverticulitis   . GERD (gastroesophageal reflux disease)   . Headache   . Hepatitis    in the late 70's   . HTN (hypertension)   . Mediastinal adenopathy   . Seizures ([place])    as of 2019 none for 20 years  . Sleep apnea    does not  use cpap  . Stroke [facility])    TIA - Feb. 2018    Past Surgical History:  Procedure Laterality Date  . CERVICAL DISC SURGERY     X4  . CHOLECYSTECTOMY    . COLONOSCOPY  08/24/2011   Procedure: COLONOSCOPY;  Surgeon: Daneil Dolin, MD;  Location: [facility] SUITE;  Service: Endoscopy;  Laterality: N/A;  7:30AM  . ELBOW SURGERY     right  . PORTACATH PLACEMENT Right 12/01/2017   Procedure: INSERTION PORT-A-CATH;  Surgeon: Aviva Signs, MD;  Location: [facility] ORS;  Service: General;  Laterality: Right;  Marland Kitchen VIDEO BRONCHOSCOPY WITH ENDOBRONCHIAL ULTRASOUND N/A 10/31/2017   Procedure: VIDEO BRONCHOSCOPY WITH ENDOBRONCHIAL ULTRASOUND;  Surgeon: Ivin Poot, MD;  [facility]  OR;  Service: Thoracic;  Laterality: N/A;  . VIDEO BRONCHOSCOPY WITH ENDOBRONCHIAL ULTRASOUND N/A 11/22/2017   Procedure: VIDEO BRONCHOSCOPY WITH ENDOBRONCHIAL ULTRASOUND;  Surgeon: Melrose Nakayama, MD;  Location: [place];  Service: Thoracic;  Laterality: N/A;     reports that he quit smoking about 9 months ago. His smoking use included cigarettes. He has a 45.00 pack-year smoking history. He has never used smokeless tobacco. He reports that he does not drink alcohol or use drugs.  Allergies  Allergen Reactions  . Xgeva [Denosumab]     Rash   . Tecentriq [Atezolizumab] Rash    Family History  Problem Relation Age of Onset  . Hypertension Father   .  Heart attack Father   . Kidney failure Mother   . Diabetes Mother   . Hypertension Mother   . Colon cancer Neg Hx   . Anesthesia problems Neg Hx   . Hypotension Neg Hx   . Malignant hyperthermia Neg Hx   . Pseudochol deficiency Neg Hx      Prior to Admission medications   Medication Sig Start Date End Date Taking? Authorizing Provider  albuterol (PROVENTIL HFA;VENTOLIN HFA) 108 (90 BASE) MCG/ACT inhaler Inhale 2 puffs into the lungs every 6 (six) hours as needed for wheezing or shortness of breath.    Yes [provider]  albuterol (PROVENTIL) (2.5 MG/3ML) 0.083% nebulizer solution Take 2.5 mg by nebulization every 6 (six) hours as needed for wheezing or shortness of breath.    Yes [provider]  alprazolam Duanne Moron) 2 MG tablet Take 2 mg by mouth every 6 (six) hours as needed for sleep or anxiety.    Yes [provider]  amLODipine (NORVASC) 10 MG tablet Take 10 mg by mouth daily.   Yes [provider]  CALCIUM-MAGNESIUM-ZINC PO Take 1,200 mg by mouth daily.   Yes [provider]  COLLAGEN PO Take 7 g by mouth daily.   Yes [provider]  cyclobenzaprine (FLEXERIL) 10 MG tablet Take 10 mg by mouth daily as needed for muscle spasms.  07/11/16  Yes [provider]  diclofenac sodium (VOLTAREN) 1 % GEL Apply 2 g topically 4 (four) times daily. Apply to left forearm phlebitic area 01/08/18  Yes Derek Jack, MD  docusate sodium (COLACE) 100 MG capsule Take 1 capsule (100 mg total) by mouth every 12 (twelve) hours. 11/27/17  Yes Mesner, Corene Cornea, MD  Flaxseed, Linseed, (FLAXSEED OIL PO) Take 1 tablet by mouth 2 (two) times daily.    Yes [provider]  fluconazole (DIFLUCAN) 200 MG tablet Take 1 tablet (200 mg total) by mouth daily. 07/17/18  Yes Lockamy, Randi L, NP-C  fluticasone (FLONASE) 50 MCG/ACT nasal spray Place 2 sprays into both nostrils daily as needed for allergies.  07/11/16  Yes [provider]    furosemide (LASIX) 20 MG tablet Take 10 mg by mouth daily.  04/10/18  Yes [provider]  Glucosamine-Chondroit-Vit C-Mn (GLUCOSAMINE 1500 COMPLEX) CAPS Take 1 capsule by mouth.   Yes [provider]  Ipilimumab (YERVOY IV) Inject into the vein.   Yes [provider]  levETIRAcetam (KEPPRA) 500 MG tablet Take 500 mg by mouth daily.  11/29/16  Yes [provider]  lidocaine (XYLOCAINE) 2 % solution Use as directed 15 mLs in the mouth or throat every 4 (four) hours as needed for mouth pain. 07/17/18  Yes Derek Jack, MD  lidocaine-prilocaine (EMLA) cream APPLY TO PORTACATH SITE AS DIRECTED 01/22/18  Yes Derek Jack,  MD  lisinopril (PRINIVIL,ZESTRIL) 20 MG tablet Take 20 mg by mouth 2 (two) times daily.   Yes [provider]  meclizine (ANTIVERT) 12.5 MG tablet Take 1 tablet (12.5 mg total) by mouth 3 (three) times daily as needed for dizziness. 10/28/16  Yes Florencia Reasons, MD  Morphine Sulfate (MORPHINE CONCENTRATE) 10 mg / 0.5 ml concentrated solution Take 0.5 mLs (10 mg total) by mouth every 6 (six) hours as needed for severe pain. 07/16/18  Yes Lockamy, Randi L, NP-C  MULTIPLE VITAMINS PO Take 1 tablet by mouth daily.    Yes [provider]  mupirocin ointment (BACTROBAN) 2 % APPLY TO SKIN TID PRN 02/15/18  Yes [provider]  naproxen (NAPROSYN) 500 MG tablet Take 500 mg by mouth 2 (two) times daily with a meal.     Yes [provider]  Nivolumab (OPDIVO IV) Inject into the vein.   Yes [provider]  oxyCODONE (ROXICODONE) 15 MG immediate release tablet Take 15 mg by mouth 4 (four) times daily as needed for pain.    Yes [provider]  pantoprazole (PROTONIX) 40 MG tablet Take 40 mg by mouth at bedtime.  02/09/18  Yes [provider]  polyethylene glycol (MIRALAX / GLYCOLAX) packet Take 17 g by mouth daily. 11/27/17  Yes Mesner, Corene Cornea, MD  pravastatin (PRAVACHOL) 20 MG tablet Take 20 mg by  mouth daily.  02/09/18  Yes [provider]  predniSONE (DELTASONE) 10 MG tablet Take 1 tablet (10 mg total) by mouth daily with breakfast. 04/27/18  Yes Lockamy, Randi L, NP-C  prochlorperazine (COMPAZINE) 10 MG tablet Take 1 tablet (10 mg total) by mouth every 6 (six) hours as needed for nausea or vomiting. 07/13/18  Yes Lockamy, Randi L, NP-C  sucralfate (CARAFATE) 1 g tablet Take 1 tablet (1 g total) by mouth 4 (four) times daily. 07/12/18  Yes Kyung Rudd, MD  tamsulosin (FLOMAX) 0.4 MG CAPS capsule Take 0.4 mg by mouth daily.  07/11/16  Yes [provider]  temazepam (RESTORIL) 30 MG capsule TAKE 1 CAPSULE AT BEDTIME AS NEEDED FOR SLEEP 07/11/18  Yes Lockamy, Randi L, NP-C  triamcinolone cream (KENALOG) 0.1 % Apply 1 application topically 2 (two) times daily.  04/23/18  Yes [provider]  TURMERIC PO Take 4 tablets by mouth daily.    Yes [provider]    Physical Exam: Vitals:   07/20/18 2000 07/20/18 2110 07/20/18 2349 07/21/18 0127  BP: 93/66 (!) 144/72 107/68   Pulse: (!) 120 (!) 118 (!) 102   Resp:  18 18   Temp:  (!) 102 F (38.9 C) (!) 102.8 F (39.3 C) (!) 100.4 F (38 C)  TempSrc:  Oral Oral Oral  SpO2: 96% 98% 100%   Weight:  82.1 kg    Height:  5\' 7"  (1.702 m)      Constitutional: Mildly febrile, otherwise NAD, calm, comfortable Eyes: PERRL, lids and conjunctivae normal ENMT: Left ear canal looks edematous and erythematous.  There was no bleeding.  There is a serous purulent left ear canal discharge.   Mucous membranes are moist. Posterior pharynx clear of any exudate or lesions. Neck: normal, supple, no masses, no thyromegaly Respiratory: clear to auscultation bilaterally, no wheezing, no crackles. Normal respiratory effort. No accessory muscle use.  Cardiovascular: Regular rate and rhythm, no murmurs / rubs / gallops. No extremity edema. 2+ pedal pulses. No carotid bruits.  Abdomen: Soft, no tenderness, no masses palpated. No  hepatosplenomegaly. Bowel sounds positive.  Musculoskeletal: no clubbing / cyanosis. Good ROM, no contractures. Normal muscle tone.  Skin: Positive left-sided periauricular area edema, erythema, calor and tenderness to palpation.  Neurologic: CN 2-12 grossly intact. Sensation intact, DTR normal. Strength 5/5 in all 4.  Psychiatric: Normal judgment and insight. Alert and oriented x 3. Normal mood.    Labs on Admission: I have personally reviewed following labs and imaging studies  CBC: Recent Labs  Lab 07/20/18 1644  WBC 4.2  NEUTROABS 3.6  HGB 11.3*  HCT 35.7*  MCV 92.7  PLT 99*   Basic Metabolic Panel: Recent Labs  Lab 07/16/18 1235 07/20/18 1644  NA 135 135  K 3.5 3.7  CL 104 108  CO2 18* 21*  GLUCOSE 110* 132*  BUN 15 6  CREATININE 0.86 0.83  CALCIUM 8.0* 8.1*  MG 2.1  --    GFR: Estimated Creatinine Clearance: 98.3 mL/min (by C-G formula based on SCr of 0.83 mg/dL). Liver Function Tests: Recent Labs  Lab 07/20/18 1644  AST 27  ALT 23  ALKPHOS 48  BILITOT 0.6  PROT 6.3*  ALBUMIN 3.2*   No results for input(s): LIPASE, AMYLASE in the last 168 hours. No results for input(s): AMMONIA in the last 168 hours. Coagulation Profile: No results for input(s): INR, PROTIME in the last 168 hours. Cardiac Enzymes: No results for input(s): CKTOTAL, CKMB, CKMBINDEX, TROPONINI in the last 168 hours. BNP (last 3 results) No results for input(s): PROBNP in the last 8760 hours. HbA1C: No results for input(s): HGBA1C in the last 72 hours. CBG: No results for input(s): GLUCAP in the last 168 hours. Lipid Profile: No results for input(s): CHOL, HDL, LDLCALC, TRIG, CHOLHDL, LDLDIRECT in the last 72 hours. Thyroid Function Tests: No results for input(s): TSH, T4TOTAL, FREET4, T3FREE, THYROIDAB in the last 72 hours. Anemia Panel: No results for input(s): VITAMINB12, FOLATE, FERRITIN, TIBC, IRON, RETICCTPCT in the last 72 hours. Urine analysis:    Component Value Date/Time    COLORURINE YELLOW 07/03/2018 0017   APPEARANCEUR CLEAR 07/03/2018 0017   LABSPEC 1.026 07/03/2018 0017   PHURINE 5.0 07/03/2018 0017   GLUCOSEU NEGATIVE 07/03/2018 0017   HGBUR NEGATIVE 07/03/2018 0017   BILIRUBINUR NEGATIVE 07/03/2018 0017   KETONESUR NEGATIVE 07/03/2018 0017   PROTEINUR NEGATIVE 07/03/2018 0017   NITRITE NEGATIVE 07/03/2018 0017   LEUKOCYTESUR NEGATIVE 07/03/2018 0017    Radiological Exams on Admission: Ct Head W Or Wo Contrast  Result Date: 07/20/2018 CLINICAL DATA:  Tachycardia, low-grade fever with throat pain. Recent diagnosis of otitis media. Assess for mastoiditis and follow-up intracranial metastasis. History of metastatic small cell lung cancer, hypertension, seizures and stroke. EXAM: CT HEAD WITHOUT AND WITH CONTRAST TECHNIQUE: Contiguous axial images were obtained from the base of the skull through the vertex without and with intravenous contrast CONTRAST:  20mL OMNIPAQUE IOHEXOL 300 MG/ML  SOLN COMPARISON:  MRI of the head June 28, 2018 FINDINGS: BRAIN: No intraparenchymal hemorrhage, mass effect nor midline shift. Mild parenchymal brain volume loss. No hydrocephalus. No acute large vascular territory infarcts. No abnormal extra-axial fluid collections. Basal cisterns are patent. No enhancing intracranial metastasis though, likely below the contrast resolution of CT. VASCULAR: Unremarkable. SKULL/SOFT TISSUES: No skull fracture. No significant soft tissue swelling. ORBITS/SINUSES: The included ocular globes and orbital contents are normal.Trace paranasal sinus mucosal thickening. LEFT middle ear effusion, incompletely evaluated. Soft tissue effacing LEFT external auditory canal. Mastoid air cells are well aerated. Status post RIGHT ocular lens implant. OTHER: None. IMPRESSION: 1. LEFT middle ear effusion and  LEFT external auditory canal soft tissue effacement. No CT findings of mastoiditis. 2. No acute intracranial process. Mild parenchymal brain volume loss for  age. 3. 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 Electronically Signed   By: Elon Alas M.D.   On: 07/20/2018 18:35   Echocardiogram 10/27/2016  ------------------------------------------------------------------- LV EF: 55% -   60%  ------------------------------------------------------------------- Indications:      TIA 435.9.  ------------------------------------------------------------------- History:   Risk factors:  Hypertension. Diabetes mellitus.  ------------------------------------------------------------------- Study Conclusions  - Left ventricle: The cavity size was normal. Wall thickness was   normal. Systolic function was normal. The estimated ejection   fraction was in the range of 55% to 60%. Wall motion was normal;   there were no regional wall motion abnormalities. Left   ventricular diastolic function parameters were normal. - Aortic valve: Valve area (VTI): 1.77 cm^2. Valve area (Vmax): 2.3   cm^2. Valve area (Vmean): 2.09 cm^2. - Technically adequate study.  EKG: Independently reviewed.    Assessment/Plan Principal Problem:   Acute otitis externa of left ear Admit to MedSurg/inpatient. Continue analgesics as needed. Continue oral fluconazole. Continue Zosyn 3.375 g IVPB every 8 hours. Consider ENT evaluation if no improvement.  Active Problems:   HTN (hypertension) Lisinopril and amlodipine were held due to hypotension concern. Monitor blood pressure.    Hyperlipidemia Hold statin while in the hospital.    GERD (gastroesophageal reflux disease) Protonix 40 mg p.o. daily.    COPD (chronic obstructive pulmonary disease) ([facility]) Supplemental oxygen and bronchodilators as needed.    Anxiety Continue alprazolam 2 mg every 6 hours as needed as needed.    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   DVT prophylaxis: SCDs. Code Status: Full code. Family  Communication: Disposition Plan: Admit for IV antibiotics for several days. Consults called: Admission status: Inpatient/MedSurg.   Reubin Milan MD [facility] Pager [phone].  If 7PM-7AM, please contact night-coverage [email or web address] Password [facility]  07/21/2018, 2:58 AM   This document was prepared using Dragon voice recognition software and may contain some unintended errors.

## 2018-07-21 NOTE — Progress Notes (Signed)
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  GOAL:   Patient will meet greater than or equal to 90% of their needs  MONITOR:   PO intake, Supplement acceptance, Weight trends, Labs, Skin  REASON FOR ASSESSMENT:   Malnutrition Screening Tool    ASSESSMENT:   57 year old male who presented to the ED on 11/8 with difficulty eating due to pain in throat, ear, and face. Pt was sent from the [place] for evaluation. 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 Pt found to have acute otitis externa of left ear and sepsis.  11/8 - CT of brain showing left middle ear effusion with left auditory canal soft tissue effacement  Suspect some degree of malnutrition given weight loss but unable to confirm at this time without completion of NFPE. Will complete at follow-up.  Due to weight loss and reported decreased PO intake PTA due to pain, liberalize diet to Regular so as not to restriction options or protein. Pt with increased nutrient needs related to cancer and cancer-related treatments, will continue Ensure Enlive BID.  Medications reviewed and include: Colace, Ensure Enlive BID, Lasix, Protonix, IV antibiotics, NaCl with KCl 20 mEq/L at 75 ml/hr  Labs reviewed: potassium 3.4 (L) - being repleted, CO2 19 (L)  NUTRITION - FOCUSED PHYSICAL EXAM:  Unable to complete given remote assessment.  Diet Order:   Diet Order           Diet regular Room service appropriate? Yes; Fluid consistency: Thin  Diet effective now              EDUCATION NEEDS:   No education needs have been identified at this time  Skin:  Skin Assessment: Reviewed RN Assessment (blister on right abdomen around port site)  Last BM:  11/9 (large type 7)  Height:   Ht Readings from Last 1 Encounters:  07/20/18 5\' 7"  (1.702 m)    Weight:   Wt Readings from Last 1 Encounters:  07/20/18 82.1 kg    Ideal Body Weight:  67.27 kg  BMI:  Body mass index is 28.33 kg/m.  Estimated Nutritional Needs:   Kcal:  2400-2600  Protein:  100-115 grams  Fluid:  >/= 2.4 L    Gaynell Face, MS, RD, LDN Inpatient Clinical Dietitian Pager: [phone] Weekend/After Hours: [phone]

## 2018-07-21 NOTE — Progress Notes (Signed)
Patients temperature elevated at 102.8. Heart rate 102. Given Tylenol earlier this shift. Mid Level made aware. New orders placed. Will continue to monitor.

## 2018-07-22 DIAGNOSIS — R197 Diarrhea, unspecified: Secondary | ICD-10-CM

## 2018-07-22 DIAGNOSIS — H6022 Malignant otitis externa, left ear: Secondary | ICD-10-CM

## 2018-07-22 LAB — CBC WITH DIFFERENTIAL/PLATELET
BASOS ABS: 0 10*3/uL (ref 0.0–0.1)
BASOS PCT: 0 %
EOS ABS: 0.2 10*3/uL (ref 0.0–0.5)
Eosinophils Relative: 4 %
HCT: 29.8 % — ABNORMAL LOW (ref 39.0–52.0)
Hemoglobin: 9 g/dL — ABNORMAL LOW (ref 13.0–17.0)
Lymphocytes Relative: 5 %
Lymphs Abs: 0.2 10*3/uL — ABNORMAL LOW (ref 0.7–4.0)
MCH: 28.8 pg (ref 26.0–34.0)
MCHC: 30.2 g/dL (ref 30.0–36.0)
MCV: 95.2 fL (ref 80.0–100.0)
MONOS PCT: 4 %
Monocytes Absolute: 0.2 10*3/uL (ref 0.1–1.0)
NEUTROS PCT: 87 %
NRBC: 0 % (ref 0.0–0.2)
Neutro Abs: 3.5 10*3/uL (ref 1.7–7.7)
PLATELETS: 60 10*3/uL — AB (ref 150–400)
RBC: 3.13 MIL/uL — AB (ref 4.22–5.81)
RDW: 15.2 % (ref 11.5–15.5)
WBC: 4 10*3/uL (ref 4.0–10.5)

## 2018-07-22 LAB — BASIC METABOLIC PANEL
Anion gap: 4 — ABNORMAL LOW (ref 5–15)
BUN: 13 mg/dL (ref 6–20)
CHLORIDE: 113 mmol/L — AB (ref 98–111)
CO2: 19 mmol/L — AB (ref 22–32)
CREATININE: 1.29 mg/dL — AB (ref 0.61–1.24)
Calcium: 6.8 mg/dL — ABNORMAL LOW (ref 8.9–10.3)
GFR calc Af Amer: >60 mL/min (ref >60)
GFR calc non Af Amer: 59 mL/min — ABNORMAL LOW (ref >60)
GLUCOSE: 97 mg/dL (ref 70–99)
Potassium: 3.5 mmol/L (ref 3.5–5.1)
Sodium: 136 mmol/L (ref 135–145)

## 2018-07-22 LAB — HIV ANTIBODY (ROUTINE TESTING W REFLEX): HIV SCREEN 4TH GENERATION: NONREACTIVE

## 2018-07-22 LAB — PROCALCITONIN: PROCALCITONIN: 5.02 ng/mL

## 2018-07-22 MED ORDER — POTASSIUM CHLORIDE CRYS ER 20 MEQ PO TBCR
40.0000 meq | EXTENDED_RELEASE_TABLET | Freq: Once | ORAL | Status: AC
  Administered 2018-07-22: 40 meq via ORAL
  Filled 2018-07-22: qty 2

## 2018-07-22 NOTE — Progress Notes (Signed)
Lab orders for BMP, Procalcitonin, and CBC with diff placed this am.  Lab staff Kim called and informed me that she had blood work in the lab for patient but did not see the orders.  Lab work reordered as ordered on admission.

## 2018-07-22 NOTE — Progress Notes (Signed)
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  Patient went to see his primary care provider approximately 2 weeks prior to this admission and was placed on cephalexin initially.  There was no clinical improvement.  He went back to see his provider on 07/17/2018.  He was placed on Cipro.  The patient continued to have left ear pain and left facial pain.  He had otorrhea and some loss of hearing.  In addition, the patient has developed fevers and chills over the last 2 to 3 days.  The patient has also has some associated dizziness, but denies any falling or syncope.  As a result of his persistent symptoms he presented for further evaluation.  In addition, the patient has been having diarrhea for the past 2 to 3 days.  He states he has been having at least 3-4 loose bowel movements daily without any hematochezia or melena.  He has had increasing abdominal cramping.  He states that for the past month he has had difficulty with oral intake secondary to what is likely to be radiation-induced esophagitis.  He has been taking oral lidocaine and Roxanol to help with his pain.  Intermittently, he is able to tolerate liquids, but he is unable to tolerate any solid food.  Since admission, the patient had a fever up to 102.8 F, but he remains hemodynamically stable saturating 95% on room air.  The patient was started on Zosyn.  The case was discussed with otolaryngology, Dr. Melissa Montane.  He graciously agreed to see the patient in consult after transfer to [facility].  07/22/2018: Patient seen alongside patient's sister, brother-in-law and nurse.  No new complaints.  Awaiting ENT  input.   Assessment/Plan: Sepsis -Present at the time of admission -Patient presenting with fever, tachycardia -Likely secondary to inner ear infection, but cannot rule out Clostridium difficile infection or bacteremia -Continue Zosyn -Add vancomycin -Blood cultures x2 sets -Lactic acid 1.24 -Check procalcitonin 07/22/2018: No new changes.  Continue current antibiotics.  Malignant otitis media -07/20/2018 CT brain left middle ear effusion with left external auditory canal soft tissue effacement -Case discussed with otolaryngology, Dr. Bonney Roussel to see in consult after transfer to [place] vancomycin 07/22/2018: Await input from the ENT team.  Diarrhea -Negative C. 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 Redge Gainer at [facility] and also at [facility] -last immunotherapy (ipilimumab+nivolumab) on 07/13/18 -06/04/2018 CT angiogram chest--negative for PE, but showed progression of disease -Finished whole brain radiation and chest and pelvic radiation 07/13/2018 -Continue Keppra for seizure prophylaxis  COPD -Stable on room air  Essential hypertension -Holding lisinopril and amlodipine for soft blood pressures  Anxiety/depression -Continue home dose alprazolam    Disposition Plan:   Transfer to [facility]  Family Communication:  No Family at bedside  Consultants:  ENT--Dr. Melissa Montane  Code Status:  FULL   DVT Prophylaxis:   [place] Lovenox  -Total time spent 25 minutes.     Procedures: As Listed in Progress Note Above  Antibiotics: vanco 11/9>>> Zosyn 11/8>>>   Subjective:  No new complaints. Patient continues to report pain on discharge from the left ear.  Objective: Vitals:   07/21/18 0700 07/21/18 1818 07/21/18 2020  07/22/18 0515  BP: 130/70 102/87 125/70 (!) 96/55  Pulse: 100 95 92 67  Resp: 18 18 16 16   Temp: 99.8 F (37.7 C) 98.7 F (37.1 C) 99.3 F (37.4 C) 98.4 F (36.9 C)  TempSrc: Oral Oral Oral Oral  SpO2: 100% 100% 97% 97%  Weight:      Height:        Intake/Output Summary (Last 24 hours) at 07/22/2018 1033 Last data filed at 07/22/2018 1000 Gross per 24 hour  Intake 1027.91 ml  Output -  Net 1027.91 ml   Weight change:  Exam:   General:  Pt is alert, follows commands appropriately, not in acute distress  HEENT: No icterus, No thrush.  Bilateral tenderness on point in both knees, worse on the left side.  Cardiovascular: RRR, S1/S2, no rubs, no gallops  Respiratory: Diminished breath sounds bilateral.  Bibasilar rales.  Abdomen: Soft/+BS, non tender, non distended, no guarding  Extremities: No edema, No lymphangitis, No petechiae, No rashes, no synovitis   Data Reviewed: I have personally reviewed following labs and imaging studies Basic Metabolic Panel: Recent Labs  Lab 07/16/18 1235 07/20/18 1644 07/21/18 0710 07/22/18 0331  NA 135 135 136 136  K 3.5 3.7 3.4* 3.5  CL 104 108 108 113*  CO2 18* 21* 19* 19*  GLUCOSE 110* 132* 119* 97  BUN 15 6 12 13   CREATININE 0.86 0.83 1.06 1.29*  CALCIUM 8.0* 8.1* 7.3* 6.8*  MG 2.1  --   --   --    Liver Function Tests: Recent Labs  Lab 07/20/18 1644  AST 27  ALT 23  ALKPHOS 48  BILITOT 0.6  PROT 6.3*  ALBUMIN 3.2*   No results for input(s): LIPASE, AMYLASE in the last 168 hours. No results for input(s): AMMONIA in the last 168 hours. Coagulation Profile: No results for input(s): INR, PROTIME in the last 168 hours. CBC: Recent Labs  Lab 07/20/18 1644 07/21/18 0710 07/22/18 0331  WBC 4.2 5.2 4.0  NEUTROABS 3.6 4.8 3.5  HGB 11.3* 10.7* 9.0*  HCT 35.7* 32.8* 29.8*  MCV 92.7 94.3 95.2  PLT 99* 78* 60*   Cardiac Enzymes: No results for input(s): CKTOTAL, CKMB, CKMBINDEX, TROPONINI in the last 168  hours. BNP: Invalid input(s): POCBNP CBG: No results for input(s): GLUCAP in the last 168 hours. HbA1C: No results for input(s): HGBA1C in the last 72 hours. Urine analysis:    Component Value Date/Time   COLORURINE YELLOW 07/03/2018 0017   APPEARANCEUR CLEAR 07/03/2018 0017   LABSPEC 1.026 07/03/2018 0017   PHURINE 5.0 07/03/2018 0017   GLUCOSEU NEGATIVE 07/03/2018 0017   HGBUR NEGATIVE 07/03/2018 0017   BILIRUBINUR NEGATIVE 07/03/2018 0017   KETONESUR NEGATIVE 07/03/2018 0017   PROTEINUR NEGATIVE 07/03/2018 0017   NITRITE NEGATIVE 07/03/2018 0017   LEUKOCYTESUR NEGATIVE 07/03/2018 0017   Sepsis Labs: @LABRCNTIP (procalcitonin:4,lacticidven:4) ) Recent Results (from the past 240 hour(s))  Culture, blood (Routine X 2) w Reflex to ID Panel     Status: None (Preliminary result)   Collection Time: 07/21/18  8:53 AM  Result Value Ref Range Status   Specimen Description PORTA CATH BOTTLES DRAWN AEROBIC AND ANAEROBIC  Final   Special Requests Blood Culture adequate volume  Final   Culture   Final    NO GROWTH 1 DAY Performed at [facility], [address],  [place], [place] [address]    Report Status PENDING  Incomplete  Culture, blood (Routine X 2) w Reflex to ID Panel     Status: None (Preliminary result)   Collection Time: 07/21/18  8:56 AM  Result Value Ref Range Status   Specimen Description BLOOD LEFT ARM BOTTLES DRAWN AEROBIC AND ANAEROBIC  Final   Special Requests Blood Culture adequate volume  Final   Culture   Final    NO GROWTH 1 DAY Performed at [facility] [address] [place], [place] [address]    Report Status PENDING  Incomplete  C difficile quick scan w PCR reflex     Status: None   Collection Time: 07/21/18 12:27 PM  Result Value Ref Range Status   C Diff antigen NEGATIVE NEGATIVE Final   C Diff toxin NEGATIVE NEGATIVE Final   C Diff interpretation No C. difficile detected.  Final    Comment: Performed at [facility], [address], [place],  [place] [address]     Scheduled Meds: . diclofenac sodium  2 g Topical QID  . docusate sodium  100 mg Oral Q12H  . feeding supplement (ENSURE ENLIVE)  237 mL Oral BID BM  . fluconazole  200 mg Oral Daily  . furosemide  10 mg Oral Daily  . levETIRAcetam  500 mg Oral Daily  . mouth rinse  15 mL Mouth Rinse BID  . pantoprazole  40 mg Oral QHS  . sucralfate  1 g Oral QID  . tamsulosin  0.4 mg Oral Daily  . triamcinolone cream   Topical BID   Continuous Infusions: . 0.9 % NaCl with KCl 20 mEq / L 75 mL/hr at 07/22/18 0055  . piperacillin-tazobactam (ZOSYN)  IV 3.375 g (07/22/18 0536)  . vancomycin 1,000 mg (07/22/18 1025)    Procedures/Studies: Dg Chest 2 View  Result Date: 07/03/2018 CLINICAL DATA:  Cough, fever EXAM: CHEST - 2 VIEW COMPARISON:  05/25/2018.  Chest CT 06/04/2018. FINDINGS: Right Port-A-Cath remains in place, unchanged. Heart and mediastinal contours are within normal limits. Unable to visualize the previously seen left hilar and AP window adenopathy on prior CT. No focal opacities or effusions. No acute bony abnormality. IMPRESSION: No active cardiopulmonary disease. Electronically Signed   By: Rolm Baptise M.D.   On: 07/03/2018 01:19   Ct Head W Or Wo Contrast  Result Date: 07/20/2018 CLINICAL DATA:  Tachycardia, low-grade fever with throat pain. Recent diagnosis of otitis media. Assess for mastoiditis and follow-up intracranial metastasis. History of metastatic small cell lung cancer, hypertension, seizures and stroke. EXAM: CT HEAD WITHOUT AND WITH CONTRAST TECHNIQUE: Contiguous axial images were obtained from the base of the skull through the vertex without and with intravenous contrast CONTRAST:  103mL OMNIPAQUE IOHEXOL 300 MG/ML  SOLN COMPARISON:  MRI of the head June 28, 2018 FINDINGS: BRAIN: No intraparenchymal hemorrhage, mass effect nor midline shift. Mild parenchymal brain volume loss. No hydrocephalus. No acute large vascular territory infarcts. No abnormal extra-axial  fluid collections. Basal cisterns are patent. No enhancing intracranial metastasis though, likely below the contrast resolution of CT. VASCULAR: Unremarkable. SKULL/SOFT TISSUES: No skull fracture. No significant soft tissue swelling. ORBITS/SINUSES: The included ocular globes and orbital contents are normal.Trace paranasal sinus mucosal thickening. LEFT middle ear effusion, incompletely evaluated. Soft tissue effacing LEFT external auditory canal. Mastoid air cells are well aerated. Status post RIGHT ocular lens implant. OTHER: None. IMPRESSION: 1. LEFT middle ear effusion and LEFT external auditory canal soft tissue effacement. No CT findings of mastoiditis.  2. No acute intracranial process. Mild parenchymal brain volume loss for age. 3. 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 Electronically Signed   By: Elon Alas M.D.   On: 07/20/2018 18:35   Mr Jeri Cos CX Contrast  Result Date: 06/28/2018 CLINICAL DATA:  57 year old male with small cell carcinoma. Disease progression in the chest last month. Difficulty talking, weakness. No prior whole brain radiation. EXAM: MRI HEAD WITHOUT AND WITH CONTRAST TECHNIQUE: Multiplanar, multiecho pulse sequences of the brain and surrounding structures were obtained without and with intravenous contrast. CONTRAST:  10 milliliters Gadavist COMPARISON:  Brain MRI 04/04/2018 and earlier. FINDINGS: Brain: There are four small round foci of abnormal diffusion identified in the brain today including both cerebral hemispheres and 1 area in the right cerebellum. Two of these appear restricted on ADC while 2 are not. Small round T2 hyperintense corresponding lesions are identified on series 7, and each lesion enhances to a varying degree. In particular, the right cerebellar lesion on series 12, image 27 and the left middle frontal gyrus lesion on image 72 have both T2 signal and enhancement which is most compatible with small metastases. A right  middle frontal gyrus lesion is only faintly evident postcontrast seen best on series 12, image 76. A left superior frontal gyrus lesion is best seen on series 13, image 13. Minimal associated cerebral edema at the cerebellar lesion. No mass effect. A 5th small round enhancing lesion without obvious diffusion abnormality is seen at the left inferior frontal gyrus on series 14, image 15. No dural thickening or leptomeningeal enhancement. No convincing restricted diffusion suggestive of acute infarction. No midline shift, mass effect, ventriculomegaly, extra-axial collection or acute intracranial hemorrhage. Cervicomedullary junction and pituitary are within normal limits. Background gray and white matter signal remains normal. No chronic cerebral blood products. Vascular: Major intracranial vascular flow voids are stable and within normal limits. Major dural venous sinuses are enhancing and appear patent. Skull and upper cervical spine: Partially visible cervical ACDF. Negative visible cervical spinal cord. No enhancing or destructive skull lesion identified. Sinuses/Orbits: Stable and negative. Other: Mastoids remain clear. Visible internal auditory structures appear normal. Scalp and face soft tissues appear negative. IMPRESSION: 1. Five new small round enhancing brain lesions are most compatible with small Brain Metastases. Minimal edema and no mass effect at this time. All lesions are annotated on axial series 12, although 2 lesions are better demonstrated on the sagittal and coronal postcontrast images. 2. No other acute intracranial abnormality. These results will be called to the ordering clinician or representative by the Radiologist Assistant, and communication documented in the PACS or zVision Dashboard. Electronically Signed   By: Genevie Ann M.D.   On: 06/28/2018 08:48    Bonnell Public, MD  [facility]  If 7PM-7AM, please contact night-coverage [email or web address] Password [email or web address] 07/22/2018, 10:33  AM   LOS: 2 days

## 2018-07-22 NOTE — Progress Notes (Signed)
MD notified to write order for ENT consult and call ENT for the patient. Both of patient's ears are raw at the back and painful. MD notified. Will continue to monitor patient.

## 2018-07-23 ENCOUNTER — Inpatient Hospital Stay
Admit: 2018-07-23 | Discharge: 2018-07-23 | Disposition: A | Discharge status: Home / Self Care | Payer: Self-pay | Attending: Radiation Oncology | Admitting: Radiation Oncology

## 2018-07-23 ENCOUNTER — Ambulatory Visit (HOSPITAL_COMMUNITY): Payer: Medicare HMO

## 2018-07-23 LAB — RENAL FUNCTION PANEL
Albumin: 2.2 g/dL — ABNORMAL LOW (ref 3.5–5.0)
Anion gap: 4 — ABNORMAL LOW (ref 5–15)
BUN: 10 mg/dL (ref 6–20)
CO2: 19 mmol/L — ABNORMAL LOW (ref 22–32)
Calcium: 7.1 mg/dL — ABNORMAL LOW (ref 8.9–10.3)
Chloride: 118 mmol/L — ABNORMAL HIGH (ref 98–111)
Creatinine, Ser: 1.54 mg/dL — ABNORMAL HIGH (ref 0.61–1.24)
GFR calc Af Amer: 55 mL/min — ABNORMAL LOW (ref >60)
GFR calc non Af Amer: 48 mL/min — ABNORMAL LOW (ref >60)
Glucose, Bld: 107 mg/dL — ABNORMAL HIGH (ref 70–99)
Phosphorus: 1.8 mg/dL — ABNORMAL LOW (ref 2.5–4.6)
Potassium: 4.1 mmol/L (ref 3.5–5.1)
Sodium: 141 mmol/L (ref 135–145)

## 2018-07-23 LAB — CBC WITH DIFFERENTIAL/PLATELET
ABS IMMATURE GRANULOCYTES: 0.02 10*3/uL (ref 0.00–0.07)
BASOS ABS: 0 10*3/uL (ref 0.0–0.1)
BASOS PCT: 0 %
Eosinophils Absolute: 0.3 10*3/uL (ref 0.0–0.5)
Eosinophils Relative: 9 %
HCT: 29.5 % — ABNORMAL LOW (ref 39.0–52.0)
HEMOGLOBIN: 8.8 g/dL — AB (ref 13.0–17.0)
IMMATURE GRANULOCYTES: 1 %
LYMPHS PCT: 11 %
Lymphs Abs: 0.4 10*3/uL — ABNORMAL LOW (ref 0.7–4.0)
MCH: 28.4 pg (ref 26.0–34.0)
MCHC: 29.8 g/dL — ABNORMAL LOW (ref 30.0–36.0)
MCV: 95.2 fL (ref 80.0–100.0)
Monocytes Absolute: 0.3 10*3/uL (ref 0.1–1.0)
Monocytes Relative: 9 %
NEUTROS PCT: 70 %
NRBC: 0 % (ref 0.0–0.2)
Neutro Abs: 2.5 10*3/uL (ref 1.7–7.7)
PLATELETS: 54 10*3/uL — AB (ref 150–400)
RBC: 3.1 MIL/uL — AB (ref 4.22–5.81)
RDW: 15.5 % (ref 11.5–15.5)
WBC: 3.5 10*3/uL — AB (ref 4.0–10.5)

## 2018-07-23 LAB — PROCALCITONIN: PROCALCITONIN: 5.07 ng/mL

## 2018-07-23 MED ORDER — LIDOCAINE 5 % EX PTCH
1.0000 | MEDICATED_PATCH | CUTANEOUS | Status: DC
  Administered 2018-07-23 – 2018-07-24 (×2): 1 via TRANSDERMAL
  Filled 2018-07-23 (×2): qty 1

## 2018-07-23 MED ORDER — LIDOCAINE 5 % EX PTCH: 1.0000 | MEDICATED_PATCH | CUTANEOUS | 0 refills | Status: AC

## 2018-07-23 MED ORDER — VANCOMYCIN HCL IN DEXTROSE 750-5 MG/150ML-% IV SOLN
750.0000 mg | Freq: Two times a day (BID) | INTRAVENOUS | Status: DC
  Administered 2018-07-23 – 2018-07-24 (×3): 750 mg via INTRAVENOUS
  Filled 2018-07-23 (×3): qty 150

## 2018-07-23 MED ORDER — ADULT MULTIVITAMIN W/MINERALS CH
1.0000 | ORAL_TABLET | Freq: Every day | ORAL | Status: DC
  Administered 2018-07-24: 1 via ORAL
  Filled 2018-07-23 (×2): qty 1

## 2018-07-23 NOTE — Progress Notes (Signed)
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  Patient went to see his primary care provider approximately 2 weeks prior to this admission and was placed on cephalexin initially.  There was no clinical improvement.  He went back to see his provider on 07/17/2018.  He was placed on Cipro.  The patient continued to have left ear pain and left facial pain.  He had otorrhea and some loss of hearing.  In addition, the patient has developed fevers and chills over the last 2 to 3 days.  The patient has also has some associated dizziness, but denies any falling or syncope.  As a result of his persistent symptoms he presented for further evaluation.  In addition, the patient has been having diarrhea for the past 2 to 3 days.  He states he has been having at least 3-4 loose bowel movements daily without any hematochezia or melena.  He has had increasing abdominal cramping.  He states that for the past month he has had difficulty with oral intake secondary to what is likely to be radiation-induced esophagitis.  He has been taking oral lidocaine and Roxanol to help with his pain.  Intermittently, he is able to tolerate liquids, but he is unable to tolerate any solid food.  Since admission, the patient had a fever up to 102.8 F, but he remains hemodynamically stable saturating 95% on room air.  The patient was started on Zosyn.  The case was discussed with otolaryngology, Dr. Melissa Montane.  He graciously agreed to see the patient in consult after transfer to [facility].  07/23/2018: Patient seen alongside patient's wife, nurse and charge nurse.  I updated all present.  ENT input is appreciated.  The plan as per Dr.  Constance Holster, ENT surgeon, was to discharge patient from the hospital to ENT office for patient is to be clean and then the patient will be readmitted to the hospital.  The patient has refused this plan, and as per collateral information, I will fill the antibiotics is working.I have updated the ENT surgeon, Dr. Harrington Challenger and that the patient was not willing to move along with the above plans.  Hopefully, patient will follow with the ENT surgeon on discharge from the hospital.    Assessment/Plan: Sepsis -Present at the time of admission -Patient presenting with fever, tachycardia -Likely secondary to inner ear infection, but cannot rule out Clostridium difficile infection or bacteremia -Continue Zosyn -Add vancomycin -Blood cultures x2 sets -Lactic acid 1.24 -Check procalcitonin 07/23/2018: No new changes.  Continue current antibiotics.  Malignant otitis media -07/20/2018 CT brain left middle ear effusion with left external auditory canal soft tissue effacement -Case discussed with otolaryngology, Dr. Bonney Roussel to see in consult after transfer to [place] vancomycin 07/23/2018: ENT input is appreciated.    Diarrhea -Negative C. 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 Redge Gainer at [facility] and also at [facility] -last immunotherapy (ipilimumab+nivolumab) on 07/13/18 -06/04/2018 CT angiogram chest--negative for PE, but showed progression of disease -Finished whole brain radiation and chest and pelvic radiation  07/13/2018 -Continue Keppra for seizure prophylaxis  COPD -Stable on room air  Essential hypertension -Holding lisinopril and amlodipine for soft blood pressures  Anxiety/depression -Continue home dose alprazolam    Disposition Plan:   Will  depend on the hospital course Family Communication: Wife Consultants: Seen by ENT--Dr. Constance Holster. Code Status:  FULL  DVT Prophylaxis:   [place] Lovenox  Procedures: As Listed in Progress Note Above  Antibiotics: vanco 11/9>>> Zosyn 11/8>>>   Subjective: No new complaints. Patient continues to report pain on discharge from the left ear.  Objective: Vitals:   07/22/18 0515 07/22/18 1329 07/22/18 2058 07/23/18 0508  BP: (!) 96/55 140/77 119/72 (!) 123/56  Pulse: 67 81 81 78  Resp: 16  18 18   Temp: 98.4 F (36.9 C) 98.1 F (36.7 C) 98.4 F (36.9 C) 98.4 F (36.9 C)  TempSrc: Oral Oral Oral Oral  SpO2: 97% 100% 98% 99%  Weight:      Height:        Intake/Output Summary (Last 24 hours) at 07/23/2018 1106 Last data filed at 07/23/2018 0600 Gross per 24 hour  Intake 2700 ml  Output -  Net 2700 ml   Weight change:  Exam:   General:  Pt is alert, follows commands appropriately, not in acute distress  HEENT: No icterus, No thrush.  Bilateral tenderness on point in both knees, worse on the left side.  Cardiovascular: RRR, S1/S2, no rubs, no gallops  Respiratory: Diminished breath sounds bilateral.  Bibasilar rales.  Abdomen: Soft/+BS, non tender, non distended, no guarding  Extremities: No edema, No lymphangitis, No petechiae, No rashes, no synovitis   Data Reviewed: I have personally reviewed following labs and imaging studies Basic Metabolic Panel: Recent Labs  Lab 07/16/18 1235 07/20/18 1644 07/21/18 0710 07/22/18 0331 07/23/18 0407  NA 135 135 136 136 141  K 3.5 3.7 3.4* 3.5 4.1  CL 104 108 108 113* 118*  CO2 18* 21* 19* 19* 19*  GLUCOSE 110* 132* 119* 97 107*  BUN 15 6 12 13 10   CREATININE 0.86 0.83 1.06 1.29* 1.54*  CALCIUM 8.0* 8.1* 7.3* 6.8* 7.1*  MG 2.1  --   --   --   --   PHOS  --   --   --   --  1.8*   Liver Function Tests: Recent Labs  Lab 07/20/18 1644 07/23/18 0407  AST 27  --   ALT 23  --   ALKPHOS 48  --   BILITOT 0.6  --   PROT  6.3*  --   ALBUMIN 3.2* 2.2*   No results for input(s): LIPASE, AMYLASE in the last 168 hours. No results for input(s): AMMONIA in the last 168 hours. Coagulation Profile: No results for input(s): INR, PROTIME in the last 168 hours. CBC: Recent Labs  Lab 07/20/18 1644 07/21/18 0710 07/22/18 0331 07/23/18 0407  WBC 4.2 5.2 4.0 3.5*  NEUTROABS 3.6 4.8 3.5 2.5  HGB 11.3* 10.7* 9.0* 8.8*  HCT 35.7* 32.8* 29.8* 29.5*  MCV 92.7 94.3 95.2 95.2  PLT 99* 78* 60* 54*   Cardiac Enzymes: No results for input(s): CKTOTAL, CKMB, CKMBINDEX, TROPONINI in the last 168 hours. BNP: Invalid input(s): POCBNP CBG: No results for input(s): GLUCAP in the last 168 hours. HbA1C: No results for input(s): HGBA1C in the last 72 hours. Urine analysis:    Component Value Date/Time   COLORURINE YELLOW 07/03/2018 0017   APPEARANCEUR CLEAR 07/03/2018 0017   LABSPEC 1.026 07/03/2018 0017   PHURINE 5.0 07/03/2018  0017   GLUCOSEU NEGATIVE 07/03/2018 0017   HGBUR NEGATIVE 07/03/2018 0017   BILIRUBINUR NEGATIVE 07/03/2018 0017   KETONESUR NEGATIVE 07/03/2018 0017   PROTEINUR NEGATIVE 07/03/2018 0017   NITRITE NEGATIVE 07/03/2018 0017   LEUKOCYTESUR NEGATIVE 07/03/2018 0017   Sepsis Labs: @LABRCNTIP (procalcitonin:4,lacticidven:4) ) Recent Results (from the past 240 hour(s))  Culture, blood (Routine X 2) w Reflex to ID Panel     Status: None (Preliminary result)   Collection Time: 07/21/18  8:53 AM  Result Value Ref Range Status   Specimen Description PORTA CATH BOTTLES DRAWN AEROBIC AND ANAEROBIC  Final   Special Requests Blood Culture adequate volume  Final   Culture   Final    NO GROWTH 2 DAYS Performed at [facility], [address], [place], [place] [address]    Report Status PENDING  Incomplete  Culture, blood (Routine X 2) w Reflex to ID Panel     Status: None (Preliminary result)   Collection Time: 07/21/18  8:56 AM  Result Value Ref Range Status   Specimen Description BLOOD LEFT ARM  BOTTLES DRAWN AEROBIC AND ANAEROBIC  Final   Special Requests Blood Culture adequate volume  Final   Culture   Final    NO GROWTH 2 DAYS Performed at [facility], [address], [place], [place] [address]    Report Status PENDING  Incomplete  C difficile quick scan w PCR reflex     Status: None   Collection Time: 07/21/18 12:27 PM  Result Value Ref Range Status   C Diff antigen NEGATIVE NEGATIVE Final   C Diff toxin NEGATIVE NEGATIVE Final   C Diff interpretation No C. difficile detected.  Final    Comment: Performed at [facility], [address], [place], [place] [address]     Scheduled Meds: . diclofenac sodium  2 g Topical QID  . docusate sodium  100 mg Oral Q12H  . feeding supplement (ENSURE ENLIVE)  237 mL Oral BID BM  . fluconazole  200 mg Oral Daily  . furosemide  10 mg Oral Daily  . levETIRAcetam  500 mg Oral Daily  . lidocaine  1 patch Transdermal Q24H  . mouth rinse  15 mL Mouth Rinse BID  . pantoprazole  40 mg Oral QHS  . sucralfate  1 g Oral QID  . tamsulosin  0.4 mg Oral Daily  . triamcinolone cream   Topical BID   Continuous Infusions: . 0.9 % NaCl with KCl 20 mEq / L 75 mL/hr at 07/23/18 0709  . piperacillin-tazobactam (ZOSYN)  IV 3.375 g (07/23/18 2440)  . vancomycin 750 mg (07/23/18 1013)    Procedures/Studies: Dg Chest 2 View  Result Date: 07/03/2018 CLINICAL DATA:  Cough, fever EXAM: CHEST - 2 VIEW COMPARISON:  05/25/2018.  Chest CT 06/04/2018. FINDINGS: Right Port-A-Cath remains in place, unchanged. Heart and mediastinal contours are within normal limits. Unable to visualize the previously seen left hilar and AP window adenopathy on prior CT. No focal opacities or effusions. No acute bony abnormality. IMPRESSION: No active cardiopulmonary disease. Electronically Signed   By: Rolm Baptise M.D.   On: 07/03/2018 01:19   Ct Head W Or Wo Contrast  Result Date: 07/20/2018 CLINICAL DATA:  Tachycardia, low-grade fever with throat pain. Recent diagnosis of  otitis media. Assess for mastoiditis and follow-up intracranial metastasis. History of metastatic small cell lung cancer, hypertension, seizures and stroke. EXAM: CT HEAD WITHOUT AND WITH CONTRAST TECHNIQUE: Contiguous axial images were obtained from the base of the skull through  the vertex without and with intravenous contrast CONTRAST:  42mL OMNIPAQUE IOHEXOL 300 MG/ML  SOLN COMPARISON:  MRI of the head June 28, 2018 FINDINGS: BRAIN: No intraparenchymal hemorrhage, mass effect nor midline shift. Mild parenchymal brain volume loss. No hydrocephalus. No acute large vascular territory infarcts. No abnormal extra-axial fluid collections. Basal cisterns are patent. No enhancing intracranial metastasis though, likely below the contrast resolution of CT. VASCULAR: Unremarkable. SKULL/SOFT TISSUES: No skull fracture. No significant soft tissue swelling. ORBITS/SINUSES: The included ocular globes and orbital contents are normal.Trace paranasal sinus mucosal thickening. LEFT middle ear effusion, incompletely evaluated. Soft tissue effacing LEFT external auditory canal. Mastoid air cells are well aerated. Status post RIGHT ocular lens implant. OTHER: None. IMPRESSION: 1. LEFT middle ear effusion and LEFT external auditory canal soft tissue effacement. No CT findings of mastoiditis. 2. No acute intracranial process. Mild parenchymal brain volume loss for age. 3. 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 Electronically Signed   By: Elon Alas M.D.   On: 07/20/2018 18:35   Mr Jeri Cos VQ Contrast  Result Date: 06/28/2018 CLINICAL DATA:  57 year old male with small cell carcinoma. Disease progression in the chest last month. Difficulty talking, weakness. No prior whole brain radiation. EXAM: MRI HEAD WITHOUT AND WITH CONTRAST TECHNIQUE: Multiplanar, multiecho pulse sequences of the brain and surrounding structures were obtained without and with intravenous contrast. CONTRAST:  10  milliliters Gadavist COMPARISON:  Brain MRI 04/04/2018 and earlier. FINDINGS: Brain: There are four small round foci of abnormal diffusion identified in the brain today including both cerebral hemispheres and 1 area in the right cerebellum. Two of these appear restricted on ADC while 2 are not. Small round T2 hyperintense corresponding lesions are identified on series 7, and each lesion enhances to a varying degree. In particular, the right cerebellar lesion on series 12, image 27 and the left middle frontal gyrus lesion on image 72 have both T2 signal and enhancement which is most compatible with small metastases. A right middle frontal gyrus lesion is only faintly evident postcontrast seen best on series 12, image 76. A left superior frontal gyrus lesion is best seen on series 13, image 13. Minimal associated cerebral edema at the cerebellar lesion. No mass effect. A 5th small round enhancing lesion without obvious diffusion abnormality is seen at the left inferior frontal gyrus on series 14, image 15. No dural thickening or leptomeningeal enhancement. No convincing restricted diffusion suggestive of acute infarction. No midline shift, mass effect, ventriculomegaly, extra-axial collection or acute intracranial hemorrhage. Cervicomedullary junction and pituitary are within normal limits. Background gray and white matter signal remains normal. No chronic cerebral blood products. Vascular: Major intracranial vascular flow voids are stable and within normal limits. Major dural venous sinuses are enhancing and appear patent. Skull and upper cervical spine: Partially visible cervical ACDF. Negative visible cervical spinal cord. No enhancing or destructive skull lesion identified. Sinuses/Orbits: Stable and negative. Other: Mastoids remain clear. Visible internal auditory structures appear normal. Scalp and face soft tissues appear negative. IMPRESSION: 1. Five new small round enhancing brain lesions are most compatible  with small Brain Metastases. Minimal edema and no mass effect at this time. All lesions are annotated on axial series 12, although 2 lesions are better demonstrated on the sagittal and coronal postcontrast images. 2. No other acute intracranial abnormality. These results will be called to the ordering clinician or representative by the Radiologist Assistant, and communication documented in the PACS or zVision Dashboard. Electronically Signed   By: Lemmie Evens  [place] Crane M.D.   On: 06/28/2018 08:48   50 minutes spent in patient's care.  Bonnell Public, MD  [facility]  If 7PM-7AM, please contact night-coverage [email or web address] Password [email or web address] 07/23/2018, 11:06 AM   LOS: 3 days

## 2018-07-23 NOTE — Progress Notes (Signed)
Nutrition Follow-up  DOCUMENTATION CODES:   Not applicable  INTERVENTION:   -D/c Ensure Enlive po BID, each supplement provides 350 kcal and 20 grams of protein -MVI with minerals daily -Magic Cup BID with meals, each supplement provides 290 kcals and 9 grams protein   NUTRITION DIAGNOSIS:   Increased nutrient needs related to cancer and cancer related treatments, chronic illness(extensive stage small cell carcinoma of lung with mets to bone and liver, COPD) as evidenced by estimated needs.  Ongoing  GOAL:   Patient will meet greater than or equal to 90% of their needs  Progresssing  MONITOR:   PO intake, Supplement acceptance, Weight trends, Labs, Skin  REASON FOR ASSESSMENT:   Malnutrition Screening Tool    ASSESSMENT:   57 year old male who presented to the ED on 11/8 with difficulty eating due to pain in throat, ear, and face. Pt was sent from the [place] for evaluation. 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 Pt found to have acute otitis externa of left ear and sepsis.  Spoke with pt and wife at bedside. Pt wife provided most of the history. Pt typically has a a very hearty appetite, consuming 3 meals per day which consisted of meat and vegetables. However, pt with ongoing poor oral intake over the past 2 weeks, due to painful swallowing- pt still had a good appetite, however, was only able to consume very soft textured foods at 3 ounces at a time. Typical intake over the past two weeks ranged from bites and sips of foods to breakfast of soft scrambled eggs and 3-4 ounces of potato soup at lunch. He had tried an Ensure supplement at home PTA, but did not like the taste.   Per pt, intake has been better today versus the past two weeks. Pt consumed 100% of breakfast meal which consisted of egg, bacon, and pancakes. Noted pt has been consuming 100% of meals since admission. Pt  reports not liking Ensure supplement due to thick texture. He is wary to try Boost Breeze supplement due to acidity, but id amenable to [employer].   Per pt wife, UBW is around 177-182#. Pt weight recently rose to 200# due to initiation of steroids. Per wt hx, pt has experienced a 6.5% wt loss over the past month, which is significant for time frame.   Pt has become less mobile over the past week. Pt requires assistance of a wheelchair, but is able to complete transfers himself at baseline. Pt wife reports pt now 100% dependent on wheelchair and requires additional assistance from her.   Nutrition-focused physical exam revealed edema in lower extremities, so may be masking further weight loss and muscle depletion. Pt remains at high risk for malnutrition, however, unable to identify at this time.   Per MD, plan for ONT evaluation.   Discussed with pt importance of good meal and supplement intake to promote healing.   Labs reviewed: Phos: 1.8.  NUTRITION - FOCUSED PHYSICAL EXAM:    Most Recent Value  Orbital Region  No depletion  Upper Arm Region  Mild depletion  Thoracic and Lumbar Region  No depletion  Buccal Region  No depletion  Temple Region  Mild depletion  Clavicle Bone Region  No depletion  Clavicle and Acromion Bone Region  No depletion  Scapular Bone Region  No depletion  Dorsal Hand  No depletion  Patellar Region  No depletion  Anterior Thigh Region  No depletion  Posterior Calf Region  No depletion  Edema (RD Assessment)  Mild  Hair  Reviewed  Eyes  Reviewed  Mouth  Reviewed  Skin  Reviewed  Nails  Reviewed       Diet Order:   Diet Order            Diet regular Room service appropriate? Yes; Fluid consistency: Thin  Diet effective now              EDUCATION NEEDS:   Education needs have been addressed  Skin:  Skin Assessment: Reviewed RN Assessment  Last BM:  07/22/18  Height:   Ht Readings from Last 1 Encounters:  07/20/18 5\' 7"  (1.702 m)     Weight:   Wt Readings from Last 1 Encounters:  07/20/18 82.1 kg    Ideal Body Weight:  67.3 kg  BMI:  Body mass index is 28.33 kg/m.  Estimated Nutritional Needs:   Kcal:  2250-2450  Protein:  105-120 grams  Fluid:  >2.2 L    Hussien Greenblatt A. Jimmye Norman, RD, LDN, CDE Pager: [phone] After hours Pager: [phone]

## 2018-07-23 NOTE — Consult Note (Signed)
Reason for Consult: External otitis Referring Physician: Bonnell Public, MD  [name] [name] is an 57 y.o. male.  HPI: He completed full brain radiation and mediastinal radiation about 2 weeks ago for metastatic cancer.  Towards the end of radiation was started developing ear pain and trouble swallowing.  He was admitted to the hospital with external otitis and inability to swallow adequate amounts.  He has lost weight because of this.  He is able to drink some liquids but not adequate amounts.  He has been on eardrops and oral antibiotics without any relief of the ears.  He uses Q-tips on a regular basis.  Past Medical History:  Diagnosis Date  . Anxiety   . Asthma   . COPD (chronic obstructive pulmonary disease) ([place])   . DDD (degenerative disc disease)   . Diverticulitis   . GERD (gastroesophageal reflux disease)   . Headache   . Hepatitis    in the late 70's   . HTN (hypertension)   . Mediastinal adenopathy   . Seizures ([place])    as of 2019 none for 20 years  . Sleep apnea    does not  use cpap  . Stroke [facility])    TIA - Feb. 2018    Past Surgical History:  Procedure Laterality Date  . CERVICAL DISC SURGERY     X4  . CHOLECYSTECTOMY    . COLONOSCOPY  08/24/2011   Procedure: COLONOSCOPY;  Surgeon: Daneil Dolin, MD;  Location: [facility];  Service: Endoscopy;  Laterality: N/A;  7:30AM  . ELBOW SURGERY     right  . PORTACATH PLACEMENT Right 12/01/2017   Procedure: INSERTION PORT-A-CATH;  Surgeon: Aviva Signs, MD;  Location: [facility] ORS;  Service: General;  Laterality: Right;  Marland Kitchen VIDEO BRONCHOSCOPY WITH ENDOBRONCHIAL ULTRASOUND N/A 10/31/2017   Procedure: VIDEO BRONCHOSCOPY WITH ENDOBRONCHIAL ULTRASOUND;  Surgeon: Ivin Poot, MD;  Location: [facility];  Service: Thoracic;  Laterality: N/A;  . VIDEO BRONCHOSCOPY WITH ENDOBRONCHIAL ULTRASOUND N/A 11/22/2017   Procedure: VIDEO BRONCHOSCOPY WITH ENDOBRONCHIAL ULTRASOUND;  Surgeon: Melrose Nakayama, MD;  Location: [facility] OR;   Service: Thoracic;  Laterality: N/A;    Family History  Problem Relation Age of Onset  . Hypertension Father   . Heart attack Father   . Kidney failure Mother   . Diabetes Mother   . Hypertension Mother   . Colon cancer Neg Hx   . Anesthesia problems Neg Hx   . Hypotension Neg Hx   . Malignant hyperthermia Neg Hx   . Pseudochol deficiency Neg Hx     Social History:  reports that he quit smoking about 9 months ago. His smoking use included cigarettes. He has a 45.00 pack-year smoking history. He has never used smokeless tobacco. He reports that he does not drink alcohol or use drugs.  Allergies:  Allergies  Allergen Reactions  . Xgeva [Denosumab]     Rash   . Tecentriq [Atezolizumab] Rash    Medications: Reviewed  Results for orders placed or performed during the hospital encounter of 07/20/18 (from the past 48 hour(s))  C difficile quick scan w PCR reflex     Status: None   Collection Time: 07/21/18 12:27 PM  Result Value Ref Range   C Diff antigen NEGATIVE NEGATIVE   C Diff toxin NEGATIVE NEGATIVE   C Diff interpretation No C. difficile detected.     Comment: Performed at [facility], [address], [place], [place] [address]  Basic metabolic panel  Status: Abnormal   Collection Time: 07/22/18  3:31 AM  Result Value Ref Range   Sodium 136 135 - 145 mmol/L   Potassium 3.5 3.5 - 5.1 mmol/L   Chloride 113 (H) 98 - 111 mmol/L   CO2 19 (L) 22 - 32 mmol/L   Glucose, Bld 97 70 - 99 mg/dL   BUN 13 6 - 20 mg/dL   Creatinine, Ser 1.29 (H) 0.61 - 1.24 mg/dL   Calcium 6.8 (L) 8.9 - 10.3 mg/dL   GFR calc non Af Amer 59 (L) >60 mL/min   GFR calc Af Amer >60 >60 mL/min    Comment: (NOTE) The eGFR has been calculated using the CKD EPI equation. This calculation has not been validated in all clinical situations. eGFR's persistently <60 mL/min signify possible Chronic Kidney Disease.    Anion gap 4 (L) 5 - 15    Comment: Performed at [facility] [address], [place], [place] [address]  Procalcitonin - Baseline     Status: None   Collection Time: 07/22/18  3:31 AM  Result Value Ref Range   Procalcitonin 5.02 ng/mL    Comment:        Interpretation: PCT > 2 ng/mL: Systemic infection (sepsis) is likely, unless other causes are known. (NOTE)       Sepsis PCT Algorithm           Lower Respiratory Tract                                      Infection PCT Algorithm    ----------------------------     ----------------------------         PCT < 0.25 ng/mL                PCT < 0.10 ng/mL         Strongly encourage             Strongly discourage   discontinuation of antibiotics    initiation of antibiotics    ----------------------------     -----------------------------       PCT 0.25 - 0.50 ng/mL            PCT 0.10 - 0.25 ng/mL               OR       >80% decrease in PCT            Discourage initiation of                                            antibiotics      Encourage discontinuation           of antibiotics    ----------------------------     -----------------------------         PCT >= 0.50 ng/mL              PCT 0.26 - 0.50 ng/mL               AND       <80% decrease in PCT              Encourage initiation of  antibiotics       Encourage continuation           of antibiotics    ----------------------------     -----------------------------        PCT >= 0.50 ng/mL                  PCT > 0.50 ng/mL               AND         increase in PCT                  Strongly encourage                                      initiation of antibiotics    Strongly encourage escalation           of antibiotics                                     -----------------------------                                           PCT <= 0.25 ng/mL                                                 OR                                        > 80% decrease in PCT                                     Discontinue / Do not  initiate                                             antibiotics Performed at [facility], [address] [place], [place] [address]   CBC with Differential/Platelet     Status: Abnormal   Collection Time: 07/22/18  3:31 AM  Result Value Ref Range   WBC 4.0 4.0 - 10.5 K/uL   RBC 3.13 (L) 4.22 - 5.81 MIL/uL   Hemoglobin 9.0 (L) 13.0 - 17.0 g/dL   HCT 29.8 (L) 39.0 - 52.0 %   MCV 95.2 80.0 - 100.0 fL   MCH 28.8 26.0 - 34.0 pg   MCHC 30.2 30.0 - 36.0 g/dL   RDW 15.2 11.5 - 15.5 %   Platelets 60 (L) 150 - 400 K/uL    Comment: REPEATED TO VERIFY Immature Platelet Fraction may be clinically indicated, consider ordering this additional test [id number] PLATELET COUNT CONFIRMED BY SMEAR CORRECTED ON 11/10 AT 0439: PREVIOUSLY REPORTED AS 60 REPEATED TO VERIFY Immature Platelet Fraction may be clinically indicated, consider ordering this additional test [id number] CONSISTENT WITH PREVIOUS RESULT    nRBC 0.0  0.0 - 0.2 %   Neutrophils Relative % 87 %   Neutro Abs 3.5 1.7 - 7.7 K/uL   Lymphocytes Relative 5 %   Lymphs Abs 0.2 (L) 0.7 - 4.0 K/uL   Monocytes Relative 4 %   Monocytes Absolute 0.2 0.1 - 1.0 K/uL   Eosinophils Relative 4 %   Eosinophils Absolute 0.2 0.0 - 0.5 K/uL   Basophils Relative 0 %   Basophils Absolute 0.0 0.0 - 0.1 K/uL   WBC Morphology      MODERATE LEFT SHIFT (>5% METAS AND MYELOS,OCC PRO NOTED)    Comment: Performed at [facility] [address], [place], [place] [address]  CBC WITH DIFFERENTIAL     Status: Abnormal   Collection Time: 07/23/18  4:07 AM  Result Value Ref Range   WBC 3.5 (L) 4.0 - 10.5 K/uL   RBC 3.10 (L) 4.22 - 5.81 MIL/uL   Hemoglobin 8.8 (L) 13.0 - 17.0 g/dL   HCT 29.5 (L) 39.0 - 52.0 %   MCV 95.2 80.0 - 100.0 fL   MCH 28.4 26.0 - 34.0 pg   MCHC 29.8 (L) 30.0 - 36.0 g/dL   RDW 15.5 11.5 - 15.5 %   Platelets 54 (L) 150 - 400 K/uL    Comment: Immature Platelet Fraction may be clinically indicated, consider ordering this  additional test [id number] CONSISTENT WITH PREVIOUS RESULT    nRBC 0.0 0.0 - 0.2 %   Neutrophils Relative % 70 %   Neutro Abs 2.5 1.7 - 7.7 K/uL   Lymphocytes Relative 11 %   Lymphs Abs 0.4 (L) 0.7 - 4.0 K/uL   Monocytes Relative 9 %   Monocytes Absolute 0.3 0.1 - 1.0 K/uL   Eosinophils Relative 9 %   Eosinophils Absolute 0.3 0.0 - 0.5 K/uL   Basophils Relative 0 %   Basophils Absolute 0.0 0.0 - 0.1 K/uL   Immature Granulocytes 1 %   Abs Immature Granulocytes 0.02 0.00 - 0.07 K/uL    Comment: Performed at [facility], [place] [address], [place], [place] [address]  Procalcitonin     Status: None   Collection Time: 07/23/18  4:07 AM  Result Value Ref Range   Procalcitonin 5.07 ng/mL    Comment:        Interpretation: PCT > 2 ng/mL: Systemic infection (sepsis) is likely, unless other causes are known. (NOTE)       Sepsis PCT Algorithm           Lower Respiratory Tract                                      Infection PCT Algorithm    ----------------------------     ----------------------------         PCT < 0.25 ng/mL                PCT < 0.10 ng/mL         Strongly encourage             Strongly discourage   discontinuation of antibiotics    initiation of antibiotics    ----------------------------     -----------------------------       PCT 0.25 - 0.50 ng/mL            PCT 0.10 - 0.25 ng/mL               OR       >  80% decrease in PCT            Discourage initiation of                                            antibiotics      Encourage discontinuation           of antibiotics    ----------------------------     -----------------------------         PCT >= 0.50 ng/mL              PCT 0.26 - 0.50 ng/mL               AND       <80% decrease in PCT              Encourage initiation of                                             antibiotics       Encourage continuation           of antibiotics    ----------------------------     -----------------------------        PCT  >= 0.50 ng/mL                  PCT > 0.50 ng/mL               AND         increase in PCT                  Strongly encourage                                      initiation of antibiotics    Strongly encourage escalation           of antibiotics                                     -----------------------------                                           PCT <= 0.25 ng/mL                                                 OR                                        > 80% decrease in PCT                                     Discontinue / Do not initiate  antibiotics Performed at [facility], [place] [address] [place], [place] [address]   Renal function panel     Status: Abnormal   Collection Time: 07/23/18  4:07 AM  Result Value Ref Range   Sodium 141 135 - 145 mmol/L   Potassium 4.1 3.5 - 5.1 mmol/L   Chloride 118 (H) 98 - 111 mmol/L   CO2 19 (L) 22 - 32 mmol/L   Glucose, Bld 107 (H) 70 - 99 mg/dL   BUN 10 6 - 20 mg/dL   Creatinine, Ser 1.54 (H) 0.61 - 1.24 mg/dL   Calcium 7.1 (L) 8.9 - 10.3 mg/dL   Phosphorus 1.8 (L) 2.5 - 4.6 mg/dL   Albumin 2.2 (L) 3.5 - 5.0 g/dL   GFR calc non Af Amer 48 (L) >60 mL/min   GFR calc Af Amer 55 (L) >60 mL/min    Comment: (NOTE) The eGFR has been calculated using the CKD EPI equation. This calculation has not been validated in all clinical situations. eGFR's persistently <60 mL/min signify possible Chronic Kidney Disease.    Anion gap 4 (L) 5 - 15    Comment: Performed at [facility] [address] [place], [place] [address]    No results found.  VKP:QAESLPNP except as listed in admit H&P  Blood pressure (!) 123/56, pulse 78, temperature 98.4 F (36.9 C), temperature source Oral, resp. rate 18, height 5' 7"  (1.702 m), weight 82.1 kg, SpO2 99 %.  PHYSICAL EXAM: Overall appearance:  Healthy appearing, in no distress Head:  Normocephalic, atraumatic. Ears: There is diffuse dry skin and  radiation dermatitis on both external ears and surrounding scalp areas.  It is severe in the left postauricular skin.  The right ear canal contains some dry wax but no signs of infection.  The left ear canal is filled with epithelial and purulent debris.  The right drum looks healthy, the left drum is not visualized.. Nose: External nose is healthy in appearance. Internal nasal exam free of any lesions or obstruction. Oral Cavity/Pharynx:  There are no mucosal lesions or masses identified. Larynx/Hypopharynx: Deferred Neuro:  No identifiable neurologic deficits. Neck: No palpable neck masses.  Studies Reviewed: none  Procedures: none   Assessment/Plan: Otitis externa on the left.  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  The left ear canal is full of debris.  This will need to be cleaned out in order for the drops to have any effect.  The most efficient way to clean out the ear is going to be in my office and we will need to have him come there from the hospital either on a pass or temporary discharge.  I am available later today and all day tomorrow to do this.  Izora Gala 07/23/2018, 11:59 AM

## 2018-07-23 NOTE — Progress Notes (Signed)
Pharmacy Antibiotic Note  [name] [name] is a 57 y.o. male admitted on 07/20/2018 with sepsis and malignant otitis media of L ear.  Pharmacy has been consulted for Vancomycin dosing.  ID: Day #3 of abx for sepsis and malignant otitis media of L ear. Case d/w ENT. C diff is negative. PCT is elevated but does have a slight AKI as well. Afebrile, WBC wnl.SCr up 1.06>1.29>1.54, - Outpatient Keflex, then Cipro  Antimicrobials this admission: Vancomycin 11/9 >>  Zosyn 11/9 >>  Fluconazole 11/8>>  Microbiology results: 11/9 BCx>> 11/9: Cdiff: negative    Plan: Decrease Vancomycin 750mg  IV Q12h with rising Scr Continue Zosyn 3.375 gm IV q8h (4 hour infusion) Should have completed fluconazole per med rec    Height: 5\' 7"  (170.2 cm) Weight: 180 lb 14.4 oz (82.1 kg) IBW/kg (Calculated) : 66.1  Temp (24hrs), Avg:98.3 F (36.8 C), Min:98.1 F (36.7 C), Max:98.4 F (36.9 C)  Recent Labs  Lab 07/16/18 1235 07/20/18 1644 07/20/18 1720 07/21/18 0710 07/22/18 0331 07/23/18 0407  WBC  --  4.2  --  5.2 4.0 3.5*  CREATININE 0.86 0.83  --  1.06 1.29* 1.54*  LATICACIDVEN  --   --  1.24  --   --   --     Estimated Creatinine Clearance: 53 mL/min (A) (by C-G formula based on SCr of 1.54 mg/dL (H)).    Allergies  Allergen Reactions  . Xgeva [Denosumab]     Rash   . Tecentriq [Atezolizumab] Rash     Yanice Maqueda S. Alford Highland, PharmD, BCPS Clinical Staff Pharmacist  Eilene Ghazi Stillinger 07/23/2018 8:21 AM

## 2018-07-24 ENCOUNTER — Telehealth: Payer: Self-pay | Admitting: Radiation Oncology

## 2018-07-24 DIAGNOSIS — H60502 Unspecified acute noninfective otitis externa, left ear: Secondary | ICD-10-CM

## 2018-07-24 LAB — CBC WITH DIFFERENTIAL/PLATELET
Abs Immature Granulocytes: 0.03 10*3/uL (ref 0.00–0.07)
BASOS PCT: 1 %
Basophils Absolute: 0 10*3/uL (ref 0.0–0.1)
EOS ABS: 0.3 10*3/uL (ref 0.0–0.5)
EOS PCT: 8 %
HEMATOCRIT: 28.5 % — AB (ref 39.0–52.0)
Hemoglobin: 8.8 g/dL — ABNORMAL LOW (ref 13.0–17.0)
Immature Granulocytes: 1 %
LYMPHS ABS: 0.6 10*3/uL — AB (ref 0.7–4.0)
Lymphocytes Relative: 17 %
MCH: 28.9 pg (ref 26.0–34.0)
MCHC: 30.9 g/dL (ref 30.0–36.0)
MCV: 93.8 fL (ref 80.0–100.0)
MONO ABS: 0.2 10*3/uL (ref 0.1–1.0)
MONOS PCT: 7 %
Neutro Abs: 2.2 10*3/uL (ref 1.7–7.7)
Neutrophils Relative %: 66 %
Platelets: 54 10*3/uL — ABNORMAL LOW (ref 150–400)
RBC: 3.04 MIL/uL — ABNORMAL LOW (ref 4.22–5.81)
RDW: 15.4 % (ref 11.5–15.5)
WBC: 3.4 10*3/uL — ABNORMAL LOW (ref 4.0–10.5)
nRBC: 0 % (ref 0.0–0.2)

## 2018-07-24 LAB — PROCALCITONIN: Procalcitonin: 4.3 ng/mL

## 2018-07-24 MED ORDER — SODIUM CHLORIDE 0.9 % IV SOLN
1.0000 g | Freq: Three times a day (TID) | INTRAVENOUS | Status: DC
  Administered 2018-07-24 – 2018-07-25 (×3): 1 g via INTRAVENOUS
  Filled 2018-07-24 (×4): qty 1

## 2018-07-24 MED ORDER — PRAVASTATIN SODIUM 10 MG PO TABS
20.0000 mg | ORAL_TABLET | Freq: Every day | ORAL | Status: DC
  Filled 2018-07-24: qty 2

## 2018-07-24 NOTE — Progress Notes (Signed)
VAST RN to bedside for CL care. Pt reported he is being discharged tomorrow with Port needle intact; to receive IV abx at home. Pt reported he has already talked with home health RN who will come out on Monday, 11/18 to change his HPN and dressing.

## 2018-07-24 NOTE — Progress Notes (Signed)
PHARMACY CONSULT NOTE FOR:  OUTPATIENT  PARENTERAL ANTIBIOTIC THERAPY (OPAT)  Indication: malignant otitis media of L ear Regimen: Cefepime 2g IV q 12h End date: 11/13-11/27/19  IV antibiotic discharge orders are pended. To discharging provider:  please sign these orders via discharge navigator,  Select New Orders & click on the button choice - Manage This Unsigned Work.     Thank you for allowing pharmacy to be a part of this patient's care.  Shirin Echeverry S. Alford Highland, PharmD, BCPS Clinical Staff Pharmacist [phone] [name] [name] 07/24/2018, 1:34 PM

## 2018-07-24 NOTE — Significant Event (Signed)
Patient and family eager to be discharged home.  Discussed with infectious disease and they recommended 2 weeks of IV cefepime therapy.  We will continue IV vancomycin and Zosyn for now and discharged home with IV cefepime for 2 weeks total treatment for malignant otitis externa.  Patient does have outpatient follow-up with ENT for cleaning of otitis externa.

## 2018-07-24 NOTE — Plan of Care (Signed)
  Problem: Education: Goal: Knowledge of General Education information will improve Description Including pain rating scale, medication(s)/side effects and non-pharmacologic comfort measures Outcome: Progressing   Problem: Health Behavior/Discharge Planning: Goal: Ability to manage health-related needs will improve Outcome: Progressing   Problem: Clinical Measurements: Goal: Ability to maintain clinical measurements within normal limits will improve Outcome: Progressing Goal: Will remain free from infection Outcome: Progressing Goal: Diagnostic test results will improve Outcome: Progressing Goal: Respiratory complications will improve Outcome: Progressing Goal: Cardiovascular complication will be avoided Outcome: Progressing   Problem: Activity: Goal: Risk for activity intolerance will decrease Outcome: Progressing   Problem: Nutrition: Goal: Adequate nutrition will be maintained Outcome: Progressing   Problem: Coping: Goal: Level of anxiety will decrease Outcome: Progressing   Problem: Pain Managment: Goal: General experience of comfort will improve Outcome: Progressing   Problem: Skin Integrity: Goal: Risk for impaired skin integrity will decrease Outcome: Progressing   

## 2018-07-24 NOTE — Telephone Encounter (Signed)
I spoke with the patient's wife and we discussed his current issues of otitis media. He is going to come tomorrow to the office to evaluate his skin to determine which type of skin cream he should try using. He is going to d/c tomorrow and also hopes to see Dr. Constance Holster in the office as well. IV abx will continue for 2 weeks in the home setting as well.     Carola Rhine, PAC

## 2018-07-24 NOTE — Progress Notes (Signed)
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  He is currently not interested in discharge as he feels like he is failed oral outpatient therapy.  He cannot have his significant debris in his ears disimpacted while in the hospital and only in the ENT clinic.  His procalcitonin is down though it was quite elevated from when he came in.  Unfortunately there is no culture data to go by a terms of duration of antibiotics or oral antibiotics. -ENT following, appreciate recommendations -Continue IV Zosyn started 07/20/2018 -Continue IV vancomycin started 07/21/2018  -blood cultures from 07/21/2018 no growth to date - Ear cultures from 07/23/2018 no growth to date  #) Diarrhea: C. difficile is negative and this is resolved now.  #) Extensive stage small cell lung cancer: Patient apparently is getting immunotherapy at [facility] though appears to have some allergies to these. -Last chemo was in July 2019 -Last radiation was in November 2019  #) Hypertension/hyperlipidemia: -Continue statin -Hold amlodipine and lisinopril as patient's blood pressures  been lower at home, it is not clear that he needs these on discharge  #) Seizure disorder: -Continue levetiracetam 500 mg daily  #) BPH: -Continue tamsulosin 0.4 mg daily  #) Possible candidal esophagitis: -Continue fluconazole 200 mg daily  Fluids: Tolerating p.o. Electrodes: Monitor and supplement Nutrition: Regular diet  Prophylaxis: Thrombocytopenic  Disposition: Completing course of antibiotics  Full code Consultants:   ENT  Procedures:   None  Antimicrobials:   IV Zosyn started 07/20/2018  IV vancomycin started 07/21/2018   Subjective: This morning patient reports he is feeling better.  He otherwise denies any nausea, vomiting, diarrhea, cough, congestion, rhinorrhea.  He again reiterates that he wants to stay in the hospital on IV antibiotics as he does not feel comfortable going home as he is apparently failed oral antibiotics.  Objective: Vitals:   07/23/18 0508 07/23/18 1339 07/23/18 2145 07/24/18 0513  BP: (!) 123/56 126/70 140/76 (!) 146/77  Pulse: 78 79 72 66  Resp: 18 18 18 19   Temp: 98.4 F (36.9 C) 98.2 F (36.8 C) 98.4 F (36.9 C) 98.5 F (36.9 C)  TempSrc: Oral Oral Oral Oral  SpO2: 99% 99% 98% 100%  Weight:      Height:        Intake/Output Summary (Last 24 hours) at 07/24/2018 1049 Last data filed at 07/24/2018 0931 Gross per 24 hour  Intake 2612.13 ml  Output -  Net 2612.13 ml   Filed Weights   07/20/18 1601 07/20/18 2110  Weight: 81.7 kg 82.1 kg    Examination:  General exam: Appears calm and comfortable  Respiratory system: Clear to auscultation. Respiratory effort normal. Cardiovascular system: Regular  rate and rhythm, no murmurs Gastrointestinal system: Soft, nondistended, no rebound or guarding, plus bowel sounds Central nervous system: Alert and oriented. No focal neurological deficits. Extremities: No lower extremity edema r. Skin: Chronic radiation changes noted over forehead, bilateral ears with debris inside, port  site in right upper extremity is clean dry and intact Psychiatry: Judgement and insight appear normal. Mood & affect appropriate.     Data Reviewed: I have personally reviewed following labs and imaging studies  CBC: Recent Labs  Lab 07/20/18 1644 07/21/18 0710 07/22/18 0331 07/23/18 0407 07/24/18 0330  WBC 4.2 5.2 4.0 3.5* 3.4*  NEUTROABS 3.6 4.8 3.5 2.5 2.2  HGB 11.3* 10.7* 9.0* 8.8* 8.8*  HCT 35.7* 32.8* 29.8* 29.5* 28.5*  MCV 92.7 94.3 95.2 95.2 93.8  PLT 99* 78* 60* 54* 54*   Basic Metabolic Panel: Recent Labs  Lab 07/20/18 1644 07/21/18 0710 07/22/18 0331 07/23/18 0407  NA 135 136 136 141  K 3.7 3.4* 3.5 4.1  CL 108 108 113* 118*  CO2 21* 19* 19* 19*  GLUCOSE 132* 119* 97 107*  BUN 6 12 13 10   CREATININE 0.83 1.06 1.29* 1.54*  CALCIUM 8.1* 7.3* 6.8* 7.1*  PHOS  --   --   --  1.8*   GFR: Estimated Creatinine Clearance: 53 mL/min (A) (by C-G formula based on SCr of 1.54 mg/dL (H)). Liver Function Tests: Recent Labs  Lab 07/20/18 1644 07/23/18 0407  AST 27  --   ALT 23  --   ALKPHOS 48  --   BILITOT 0.6  --   PROT 6.3*  --   ALBUMIN 3.2* 2.2*   No results for input(s): LIPASE, AMYLASE in the last 168 hours. No results for input(s): AMMONIA in the last 168 hours. Coagulation Profile: No results for input(s): INR, PROTIME in the last 168 hours. Cardiac Enzymes: No results for input(s): CKTOTAL, CKMB, CKMBINDEX, TROPONINI in the last 168 hours. BNP (last 3 results) No results for input(s): PROBNP in the last 8760 hours. HbA1C: No results for input(s): HGBA1C in the last 72 hours. CBG: No results for input(s): GLUCAP in the last 168 hours. Lipid Profile: No results for input(s): CHOL, HDL, LDLCALC, TRIG, CHOLHDL, LDLDIRECT in the last 72 hours. Thyroid Function Tests: No results for input(s): TSH, T4TOTAL, FREET4, T3FREE, THYROIDAB in the last 72 hours. Anemia Panel: No results for input(s): VITAMINB12, FOLATE, FERRITIN, TIBC, IRON, RETICCTPCT in  the last 72 hours. Sepsis Labs: Recent Labs  Lab 07/20/18 1720 07/21/18 0901 07/22/18 0331 07/23/18 0407 07/24/18 0330  PROCALCITON  --  6.31 5.02 5.07 4.30  LATICACIDVEN 1.24  --   --   --   --     Recent Results (from the past 240 hour(s))  Culture, blood (Routine X 2) w Reflex to ID Panel     Status: None (Preliminary result)   Collection Time: 07/21/18  8:53 AM  Result Value Ref Range Status   Specimen Description PORTA CATH BOTTLES DRAWN AEROBIC AND ANAEROBIC  Final   Special Requests Blood Culture adequate volume  Final   Culture   Final    NO GROWTH 3 DAYS Performed at [facility], [address], [place], [place] [address]    Report Status PENDING  Incomplete  Culture, blood (Routine X 2) w Reflex to ID Panel     Status: None (Preliminary result)   Collection Time: 07/21/18  8:56 AM  Result Value Ref Range Status   Specimen Description BLOOD LEFT ARM BOTTLES DRAWN AEROBIC  AND ANAEROBIC  Final   Special Requests Blood Culture adequate volume  Final   Culture   Final    NO GROWTH 3 DAYS Performed at [facility], [address] [place], [place] [address]    Report Status PENDING  Incomplete  C difficile quick scan w PCR reflex     Status: None   Collection Time: 07/21/18 12:27 PM  Result Value Ref Range Status   C Diff antigen NEGATIVE NEGATIVE Final   C Diff toxin NEGATIVE NEGATIVE Final   C Diff interpretation No C. difficile detected.  Final    Comment: Performed at [facility], [address] [place], [place] [address]  Aerobic Culture (superficial specimen) w precautions panel     Status: None (Preliminary result)   Collection Time: 07/23/18  4:16 PM  Result Value Ref Range Status   Specimen Description EAR LEFT RAW AREA BEHIND EAR  Final   Special Requests NONE  Final   Gram Stain   Final    NO WBC SEEN NO ORGANISMS SEEN Performed at [facility], [address], [place], [place] [address]    Culture PENDING  Incomplete   Report Status PENDING   Incomplete  Aerobic Culture (superficial specimen) w precautions panel     Status: None (Preliminary result)   Collection Time: 07/23/18  4:16 PM  Result Value Ref Range Status   Specimen Description EAR RIGHT RAW AREA BEHIND EAR  Final   Special Requests NONE  Final   Gram Stain   Final    NO WBC SEEN NO ORGANISMS SEEN Performed at [facility], [address], [place], [place] [address]    Culture PENDING  Incomplete   Report Status PENDING  Incomplete         Radiology Studies: No results found.      Scheduled Meds: . diclofenac sodium  2 g Topical QID  . fluconazole  200 mg Oral Daily  . levETIRAcetam  500 mg Oral Daily  . lidocaine  1 patch Transdermal Q24H  . mouth rinse  15 mL Mouth Rinse BID  . multivitamin with minerals  1 tablet Oral Daily  . pantoprazole  40 mg Oral QHS  . sucralfate  1 g Oral QID  . tamsulosin  0.4 mg Oral Daily  . triamcinolone cream   Topical BID   Continuous Infusions: . 0.9 % NaCl with KCl 20 mEq / L 75 mL/hr at 07/24/18 0623  . piperacillin-tazobactam (ZOSYN)  IV 12.5 mL/hr at 07/24/18 0623  . vancomycin 750 mg (07/24/18 0819)     LOS: 4 days    Time spent: [place], MD [facility]  If 7PM-7AM, please contact night-coverage [email or web address] Password [facility] 07/24/2018, 10:49 AM

## 2018-07-24 NOTE — Care Management Note (Signed)
Case Management Note  Patient Details  Name: [name] [name] MRN: [id number] Date of Birth: 09-Aug-1961  Subjective/Objective:                    Action/Plan: Confirmed face sheet information with patient and wife [name] at bedside.   Explained home health RN will not be present every time a dose of IV ABX will be due. Patient and wife will be taught how to adminster IV ABX.  Both voiced understanding.   Patient will have to receive first dose of Cefepime in hospital before discharge.  Expected Discharge Date:  07/23/18               Expected Discharge Plan:  [place]  In-House Referral:     Discharge planning Services  CM Consult  Post Acute Care Choice:  Home Health Choice offered to:  Patient, Spouse  DME Arranged:  N/A DME Agency:  NA  HH Arranged:  RN, IV Antibiotics HH Agency:  [facility]  Status of Service:  In process, will continue to follow  If discussed at Long Length of Stay Meetings, dates discussed:    Additional Comments:  Marilu Favre, RN 07/24/2018, 2:31 PM

## 2018-07-25 ENCOUNTER — Ambulatory Visit
Admission: RE | Admit: 2018-07-25 | Discharge: 2018-07-25 | Disposition: A | Discharge status: Home / Self Care | Payer: Medicare HMO | Source: Ambulatory Visit | Attending: Radiation Oncology | Admitting: Radiation Oncology

## 2018-07-25 ENCOUNTER — Encounter: Payer: Self-pay | Admitting: Radiation Oncology

## 2018-07-25 ENCOUNTER — Other Ambulatory Visit: Payer: Self-pay

## 2018-07-25 VITALS — BP 176/83 | HR 64 | Temp 98.0°F | Resp 18 | Ht 67.0 in | Wt 183.6 lb

## 2018-07-25 DIAGNOSIS — C3490 Malignant neoplasm of unspecified part of unspecified bronchus or lung: Secondary | ICD-10-CM

## 2018-07-25 DIAGNOSIS — L598 Other specified disorders of the skin and subcutaneous tissue related to radiation: Secondary | ICD-10-CM | POA: Insufficient documentation

## 2018-07-25 DIAGNOSIS — C3492 Malignant neoplasm of unspecified part of left bronchus or lung: Secondary | ICD-10-CM | POA: Insufficient documentation

## 2018-07-25 DIAGNOSIS — H6090 Unspecified otitis externa, unspecified ear: Secondary | ICD-10-CM | POA: Insufficient documentation

## 2018-07-25 DIAGNOSIS — Z79899 Other long term (current) drug therapy: Secondary | ICD-10-CM | POA: Insufficient documentation

## 2018-07-25 LAB — CBC
HCT: 28.6 % — ABNORMAL LOW (ref 39.0–52.0)
Hemoglobin: 8.7 g/dL — ABNORMAL LOW (ref 13.0–17.0)
MCH: 29.1 pg (ref 26.0–34.0)
MCHC: 30.4 g/dL (ref 30.0–36.0)
MCV: 95.7 fL (ref 80.0–100.0)
Platelets: 52 K/uL — ABNORMAL LOW (ref 150–400)
RBC: 2.99 MIL/uL — ABNORMAL LOW (ref 4.22–5.81)
RDW: 15.3 % (ref 11.5–15.5)
WBC: 3.9 10*3/uL — ABNORMAL LOW (ref 4.0–10.5)
nRBC: 0 % (ref 0.0–0.2)

## 2018-07-25 LAB — BASIC METABOLIC PANEL WITH GFR
Anion gap: 3 — ABNORMAL LOW (ref 5–15)
CO2: 22 mmol/L (ref 22–32)
Calcium: 7.5 mg/dL — ABNORMAL LOW (ref 8.9–10.3)
Creatinine, Ser: 1.32 mg/dL — ABNORMAL HIGH (ref 0.61–1.24)
GFR calc non Af Amer: 57 mL/min — ABNORMAL LOW (ref >60)
Glucose, Bld: 117 mg/dL — ABNORMAL HIGH (ref 70–99)

## 2018-07-25 LAB — BASIC METABOLIC PANEL
BUN: 5 mg/dL — ABNORMAL LOW (ref 6–20)
Chloride: 117 mmol/L — ABNORMAL HIGH (ref 98–111)
GFR calc Af Amer: >60 mL/min (ref >60)
Potassium: 3.6 mmol/L (ref 3.5–5.1)
Sodium: 142 mmol/L (ref 135–145)

## 2018-07-25 LAB — AEROBIC CULTURE W GRAM STAIN (SUPERFICIAL SPECIMEN)
Culture: NORMAL
Gram Stain: NONE SEEN

## 2018-07-25 LAB — AEROBIC CULTURE  (SUPERFICIAL SPECIMEN): Gram Stain: NONE SEEN

## 2018-07-25 LAB — PROCALCITONIN: Procalcitonin: 4.06 ng/mL

## 2018-07-25 MED ORDER — CEFEPIME IV (FOR PTA / DISCHARGE USE ONLY): 2.0000 g | Freq: Two times a day (BID) | INTRAVENOUS | 0 refills | Status: DC

## 2018-07-25 MED ORDER — HEPARIN SOD (PORK) LOCK FLUSH 100 UNIT/ML IV SOLN
500.0000 [IU] | INTRAVENOUS | Status: AC | PRN
  Administered 2018-07-25: 500 [IU]

## 2018-07-25 NOTE — Progress Notes (Addendum)
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   Patient went to see his primary care provider approximately 2 weeks prior to this admission and was placed on cephalexin initially.  There was no clinical improvement.  He went back to see his provider on 07/17/2018.  He was placed on Cipro.  The patient continued to have left ear pain and left facial pain.  He had otorrhea and some loss of hearing.  In addition, the patient has developed fevers and chills over the last 2 to 3 days.  The patient has also has some associated dizziness, but denies any falling or syncope.  As a result of his persistent symptoms he presented for further evaluation.  The case was discussed with otolaryngology, Dr. Melissa Montane.  He graciously agreed to see the patient in consult after transfer to [facility].  Infectious disease  recommended 2 weeks of IV cefepime therapy for 2 weeks total treatment for malignant otitis externa.  Received IV vancomycin started 07-21-18 and Zosyn started 07-20-18 in the hospital  blood cultures from 07/21/2018 no growth to date Ear cultures from 07/23/2018 no growth to date   Here to evaluate his skin to determine which type of skin cream he should try using to both ears, face and forehead these areas were red with swelling.  Today his face does not look red and has less swelling skin with dry appearance and scaly with burning.  Using Kenalog cream.  No drainage noticed from his left ear at this time. Wt Readings from Last 3 Encounters:  07/20/18 180 lb 14.4 oz (82.1 kg)  07/25/18 183 lb 9.6 oz (83.3 kg)  07/20/18 180 lb 3.2 oz (81.7 kg)  BP (!) 176/83 (BP Location: Right Arm, Patient Position: Sitting)    Pulse 64   Temp 98 F (36.7 C) (Oral)   Resp 18   Ht 5\' 7"  (1.702 m)   Wt 183 lb 9.6 oz (83.3 kg)   SpO2 96%   BMI 28.76 kg/m

## 2018-07-25 NOTE — Discharge Summary (Signed)
Physician Discharge Summary  [name] [name] NUU:[id number] DOB: 1961/04/05 DOA: 07/20/2018  PCP: Redmond School, MD  Admit date: 07/20/2018 Discharge date: 07/25/2018  Admitted From:Home Disposition:  Home  Recommendations for Outpatient Follow-up:  1. Follow up with PCP in 1-2 weeks 2. Please obtain BMP/CBC in weekly and ESR and CRP every other week while on IV cefepime  Home Health: Yes Home health RN Equipment/Devices: IV antibiotics via port  Discharge Condition: Stable CODE STATUS: Full Diet recommendation: Regular  Brief/Interim Summary:  #) Malignant otitis externa: Patient was admitted with failure of outpatient treatment with malignant otitis externa.  He was noted to have sepsis with fever and elevated white count.  CT of the head showed left middle ear effusion and effacement of the left external auditory canal.  His procalcitonin was elevated.  He was started on IV vancomycin and Zosyn.  ENT was consulted and recommended disimpaction of the ears in the clinic.  They will disimpact his ears as an outpatient immediately after discharge.  Patient was transitioned to IV cefepime on discharge to complete total of 2 weeks.  Blood cultures from 07/21/2018 were negative.  Your cultures from 07/23/2018 grew out staph epidermidis which was presumed to be skin flora.  # ) Diarrhea: Patient C. difficile was negative and diarrhea self resolved.  #) Extensive stage small cell lung cancer: Patient is getting immunotherapy at [facility] because of his allergies to certain immunotherapies.  His last chemotherapy was in July 2019 and his last radiation treatment was in November 2019.  #) Hypertension/hyperlipidemia: Patient was continued on home statin.  He was told to discontinue his amlodipine and lisinopril as his blood pressures have been lower.  He has not taken his medications.  #) Seizure disorder: Patient was continued on home levetiracetam.  #) BPH: Patient was Continued on home  tamsulosin.  #) Possible esophageal candidiasis: Patient was continued on fluconazole.  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)   GERD (gastroesophageal reflux disease)   COPD (chronic obstructive pulmonary disease) (HCC)   Anxiety   Sepsis due to undetermined organism ([place])   Malignant otitis media of left ear   Diarrhea    Discharge Instructions  Discharge Instructions    Call MD for:  difficulty breathing, headache or visual disturbances   Complete by:  As directed    Call MD for:  hives   Complete by:  As directed    Call MD for:  persistant dizziness or light-headedness   Complete by:  As directed    Call MD for:  persistant nausea and vomiting   Complete by:  As directed    Call MD for:  redness, tenderness, or signs of infection (pain, swelling, redness, odor or green/yellow discharge around incision site)   Complete by:  As directed    Call MD for:  severe uncontrolled pain   Complete by:  As directed    Call MD for:  temperature >100.4   Complete by:  As directed    Diet - low sodium heart healthy   Complete by:  As directed    Diet - low sodium heart healthy   Complete by:  As directed    Discharge instructions   Complete by:  As directed    Please take your antibiotics as prescribed.  Please follow-up with your primary care doctor in 1  week.   Home infusion instructions [facility] May follow [place] Dosing Protocol; May administer Cathflo as needed to maintain patency of vascular access device.; Flushing of vascular access device: per [facility] Protocol: 0.9% NaCl pre/post medica...   Complete by:  As directed    Instructions:  May follow [place] Dosing Protocol   Instructions:  May administer Cathflo as needed to maintain patency of vascular access device.   Instructions:  Flushing of vascular  access device: per [facility] Protocol: 0.9% NaCl pre/post medication administration and prn patency; Heparin 100 u/ml, 19m for implanted ports and Heparin 10u/ml, 561mfor all other central venous catheters.   Instructions:  May follow [facility] Anaphylaxis Protocol for First Dose Administration in the home: 0.9% NaCl at 25-50 ml/hr to maintain IV access for protocol meds. Epinephrine 0.3 ml IV/IM PRN and Benadryl 25-50 IV/IM PRN s/s of anaphylaxis.   Instructions:  [facility] Coordinator (RN) to assist per patient IV care needs in the home PRN.   Increase activity slowly   Complete by:  As directed    Increase activity slowly   Complete by:  As directed      Allergies as of 07/25/2018      Reactions   Xgeva [denosumab]    Rash    Tecentriq [atezolizumab] Rash      Medication List    STOP taking these medications   amLODipine 10 MG tablet Commonly known as:  NORVASC   CALCIUM-MAGNESIUM-ZINC PO   COLLAGEN PO   docusate sodium 100 MG capsule Commonly known as:  COLACE   FLAXSEED OIL PO   furosemide 20 MG tablet Commonly known as:  LASIX   GLUCOSAMINE 1500 COMPLEX Caps   lisinopril 20 MG tablet Commonly known as:  PRINIVIL,ZESTRIL   mupirocin ointment 2 % Commonly known as:  BACTROBAN   naproxen 500 MG tablet Commonly known as:  NAPROSYN   polyethylene glycol packet Commonly known as:  MIRALAX / GLYCOLAX   predniSONE 10 MG tablet Commonly known as:  DELTASONE   prochlorperazine 10 MG tablet Commonly known as:  COMPAZINE   TURMERIC PO     TAKE these medications   albuterol (2.5 MG/3ML) 0.083% nebulizer solution Commonly known as:  PROVENTIL Take 2.5 mg by nebulization every 6 (six) hours as needed for wheezing or shortness of breath.   albuterol 108 (90 Base) MCG/ACT inhaler Commonly known as:  PROVENTIL HFA;VENTOLIN HFA Inhale 2 puffs into the lungs every 6 (six) hours as needed for wheezing or shortness of breath.   alprazolam 2 MG tablet Commonly  known as:  XANAX Take 2 mg by mouth every 6 (six) hours as needed for sleep or anxiety.   ceFEPime  IVPB Commonly known as:  MAXIPIME Inject 2 g into the vein every 12 (twelve) hours. Indication:  malignant otitis media of L ear Last Day of Therapy:  08/08/2018 Labs - Once weekly:  CBC/D and BMP, Labs - Every other week:  ESR and CRP   cyclobenzaprine 10 MG tablet Commonly known as:  FLEXERIL Take 10 mg by mouth daily as needed for muscle spasms.   diclofenac sodium 1 % Gel Commonly known as:  VOLTAREN Apply 2 g topically 4 (four) times daily. Apply to left forearm phlebitic area   fluconazole 200 MG tablet Commonly known as:  DIFLUCAN Take 1 tablet (200 mg total) by mouth daily.   fluticasone 50 MCG/ACT nasal spray Commonly known as:  FLONASE Place 2 sprays into both nostrils daily  as needed for allergies.   levETIRAcetam 500 MG tablet Commonly known as:  KEPPRA Take 500 mg by mouth daily.   lidocaine 2 % solution Commonly known as:  XYLOCAINE Use as directed 15 mLs in the mouth or throat every 4 (four) hours as needed for mouth pain.   lidocaine 5 % Commonly known as:  LIDODERM Place 1 patch onto the skin daily. Remove & Discard patch within 12 hours or as directed by MD   lidocaine-prilocaine cream Commonly known as:  EMLA APPLY TO PORTACATH SITE AS DIRECTED   meclizine 12.5 MG tablet Commonly known as:  ANTIVERT Take 1 tablet (12.5 mg total) by mouth 3 (three) times daily as needed for dizziness.   morphine CONCENTRATE 10 mg / 0.5 ml concentrated solution Take 0.5 mLs (10 mg total) by mouth every 6 (six) hours as needed for severe pain.   MULTIPLE VITAMINS PO Take 1 tablet by mouth daily.   OPDIVO IV Inject into the vein.   oxyCODONE 15 MG immediate release tablet Commonly known as:  ROXICODONE Take 15 mg by mouth 4 (four) times daily as needed for pain.   pantoprazole 40 MG tablet Commonly known as:  PROTONIX Take 40 mg by mouth at bedtime.    pravastatin 20 MG tablet Commonly known as:  PRAVACHOL Take 20 mg by mouth daily.   sucralfate 1 g tablet Commonly known as:  CARAFATE Take 1 tablet (1 g total) by mouth 4 (four) times daily.   tamsulosin 0.4 MG Caps capsule Commonly known as:  FLOMAX Take 0.4 mg by mouth daily.   temazepam 30 MG capsule Commonly known as:  RESTORIL TAKE 1 CAPSULE AT BEDTIME AS NEEDED FOR SLEEP   triamcinolone cream 0.1 % Commonly known as:  KENALOG Apply 1 application topically 2 (two) times daily.   YERVOY IV Inject into the vein.            Home Infusion Instuctions  (From admission, onward)         Start     Ordered   07/25/18 0000  Home infusion instructions [facility] May follow [place] Dosing Protocol; May administer Cathflo as needed to maintain patency of vascular access device.; Flushing of vascular access device: per [facility] Protocol: 0.9% NaCl pre/post medica...    Question Answer Comment  Instructions May follow [place] Dosing Protocol   Instructions May administer Cathflo as needed to maintain patency of vascular access device.   Instructions Flushing of vascular access device: per [facility] Protocol: 0.9% NaCl pre/post medication administration and prn patency; Heparin 100 u/ml, 13m for implanted ports and Heparin 10u/ml, 532mfor all other central venous catheters.   Instructions May follow [facility] Anaphylaxis Protocol for First Dose Administration in the home: 0.9% NaCl at 25-50 ml/hr to maintain IV access for protocol meds. Epinephrine 0.3 ml IV/IM PRN and Benadryl 25-50 IV/IM PRN s/s of anaphylaxis.   Instructions [facility] Infusion Coordinator (RN) to assist per patient IV care needs in the home PRN.      07/25/18 12[place] up.   Contact information: [phone]        Allergies  Allergen Reactions  . Xgeva [Denosumab]     Rash    . Tecentriq [Atezolizumab] Rash    Consultations: ENT, Dr RoConstance HolsterProcedures/Studies: Dg Chest 2 View  Result Date: 07/03/2018 CLINICAL DATA:  Cough, fever EXAM: CHEST - 2 VIEW COMPARISON:  05/25/2018.  Chest CT 06/04/2018. FINDINGS: Right Port-A-Cath remains in place, unchanged. Heart and mediastinal contours are within normal limits. Unable to visualize the previously seen left hilar and AP window adenopathy on prior CT. No focal opacities or effusions. No acute bony abnormality. IMPRESSION: No active cardiopulmonary disease. Electronically Signed   By: Rolm Baptise M.D.   On: 07/03/2018 01:19   Ct Head W Or Wo Contrast  Result Date: 07/20/2018 CLINICAL DATA:  Tachycardia, low-grade fever with throat pain. Recent diagnosis of otitis media. Assess for mastoiditis and follow-up intracranial metastasis. History of metastatic small cell lung cancer, hypertension, seizures and stroke. EXAM: CT HEAD WITHOUT AND WITH CONTRAST TECHNIQUE: Contiguous axial images were obtained from the base of the skull through the vertex without and with intravenous contrast CONTRAST:  70m OMNIPAQUE IOHEXOL 300 MG/ML  SOLN COMPARISON:  MRI of the head June 28, 2018 FINDINGS: BRAIN: No intraparenchymal hemorrhage, mass effect nor midline shift. Mild parenchymal brain volume loss. No hydrocephalus. No acute large vascular territory infarcts. No abnormal extra-axial fluid collections. Basal cisterns are patent. No enhancing intracranial metastasis though, likely below the contrast resolution of CT. VASCULAR: Unremarkable. SKULL/SOFT TISSUES: No skull fracture. No significant soft tissue swelling. ORBITS/SINUSES: The included ocular globes and orbital contents are normal.Trace paranasal sinus mucosal thickening. LEFT middle ear effusion, incompletely evaluated. Soft tissue effacing LEFT external auditory canal. Mastoid air cells are well aerated. Status post RIGHT ocular lens implant. OTHER: None. IMPRESSION: 1. LEFT middle  ear effusion and LEFT external auditory canal soft tissue effacement. No CT findings of mastoiditis. 2. No acute intracranial process. Mild parenchymal brain volume loss for age. 3. 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 Electronically Signed   By: CElon AlasM.D.   On: 07/20/2018 18:35   Mr BJeri CosWCBContrast  Result Date: 06/28/2018 CLINICAL DATA:  57year old male with small cell carcinoma. Disease progression in the chest last month. Difficulty talking, weakness. No prior whole brain radiation. EXAM: MRI HEAD WITHOUT AND WITH CONTRAST TECHNIQUE: Multiplanar, multiecho pulse sequences of the brain and surrounding structures were obtained without and with intravenous contrast. CONTRAST:  10 milliliters Gadavist COMPARISON:  Brain MRI 04/04/2018 and earlier. FINDINGS: Brain: There are four small round foci of abnormal diffusion identified in the brain today including both cerebral hemispheres and 1 area in the right cerebellum. Two of these appear restricted on ADC while 2 are not. Small round T2 hyperintense corresponding lesions are identified on series 7, and each lesion enhances to a varying degree. In particular, the right cerebellar lesion on series 12, image 27 and the left middle frontal gyrus lesion on image 72 have both T2 signal and enhancement which is most compatible with small metastases. A right middle frontal gyrus lesion is only faintly evident postcontrast seen best on series 12, image 76. A left superior frontal gyrus lesion is best seen on series 13, image 13. Minimal associated cerebral edema at the cerebellar lesion. No mass effect. A 5th small round enhancing lesion without obvious diffusion abnormality is seen at the left inferior frontal gyrus on series 14, image 15. No dural thickening or leptomeningeal enhancement. No convincing restricted diffusion suggestive of acute infarction. No midline shift, mass effect, ventriculomegaly, extra-axial  collection or acute intracranial hemorrhage. Cervicomedullary junction and pituitary are within normal limits. Background gray and white matter signal remains normal. No chronic cerebral blood products. Vascular: Major intracranial  vascular flow voids are stable and within normal limits. Major dural venous sinuses are enhancing and appear patent. Skull and upper cervical spine: Partially visible cervical ACDF. Negative visible cervical spinal cord. No enhancing or destructive skull lesion identified. Sinuses/Orbits: Stable and negative. Other: Mastoids remain clear. Visible internal auditory structures appear normal. Scalp and face soft tissues appear negative. IMPRESSION: 1. Five new small round enhancing brain lesions are most compatible with small Brain Metastases. Minimal edema and no mass effect at this time. All lesions are annotated on axial series 12, although 2 lesions are better demonstrated on the sagittal and coronal postcontrast images. 2. No other acute intracranial abnormality. These results will be called to the ordering clinician or representative by the Radiologist Assistant, and communication documented in the PACS or zVision Dashboard. Electronically Signed   By: Genevie Ann M.D.   On: 06/28/2018 08:48       Subjective:   Discharge Exam: Vitals:   07/24/18 2032 07/25/18 0617  BP: (!) 144/75 137/69  Pulse: 72 66  Resp: 16 16  Temp: 98.2 F (36.8 C) 99.1 F (37.3 C)  SpO2: 99% 98%   Vitals:   07/24/18 0513 07/24/18 1433 07/24/18 2032 07/25/18 0617  BP: (!) 146/77 (!) 155/75 (!) 144/75 137/69  Pulse: 66 69 72 66  Resp: 19 17 16 16   Temp: 98.5 F (36.9 C) 98.2 F (36.8 C) 98.2 F (36.8 C) 99.1 F (37.3 C)  TempSrc: Oral Oral Oral Oral  SpO2: 100% 100% 99% 98%  Weight:      Height:       General exam: Appears calm and comfortable  Respiratory system: Clear to auscultation. Respiratory effort normal. Cardiovascular system: Regular rate and rhythm, no  murmurs Gastrointestinal system: Soft, nondistended, no rebound or guarding, plus bowel sounds Central nervous system: Alert and oriented. No focal neurological deficits. Extremities: No lower extremity edema r. Skin: Chronic radiation changes noted over forehead, bilateral ears with debris inside, port site in right upper extremity is clean dry and intact Psychiatry: Judgement and insight appear normal. Mood & affect appropriate    The results of significant diagnostics from this hospitalization (including imaging, microbiology, ancillary and laboratory) are listed below for reference.     Microbiology: Recent Results (from the past 240 hour(s))  Culture, blood (Routine X 2) w Reflex to ID Panel     Status: None (Preliminary result)   Collection Time: 07/21/18  8:53 AM  Result Value Ref Range Status   Specimen Description PORTA CATH BOTTLES DRAWN AEROBIC AND ANAEROBIC  Final   Special Requests Blood Culture adequate volume  Final   Culture   Final    NO GROWTH 4 DAYS Performed at [facility], [address], [place], [place] [address]    Report Status PENDING  Incomplete  Culture, blood (Routine X 2) w Reflex to ID Panel     Status: None (Preliminary result)   Collection Time: 07/21/18  8:56 AM  Result Value Ref Range Status   Specimen Description BLOOD LEFT ARM BOTTLES DRAWN AEROBIC AND ANAEROBIC  Final   Special Requests Blood Culture adequate volume  Final   Culture   Final    NO GROWTH 4 DAYS Performed at [facility], [address], [place], [place] [address]    Report Status PENDING  Incomplete  C difficile quick scan w PCR reflex     Status: None   Collection Time: 07/21/18 12:27 PM  Result Value Ref Range Status   C Diff antigen  NEGATIVE NEGATIVE Final   C Diff toxin NEGATIVE NEGATIVE Final   C Diff interpretation No C. difficile detected.  Final    Comment: Performed at [facility], [address], [place], [place] [address]  Aerobic Culture (superficial specimen)  w precautions panel     Status: None (Preliminary result)   Collection Time: 07/23/18  4:16 PM  Result Value Ref Range Status   Specimen Description EAR LEFT RAW AREA BEHIND EAR  Final   Special Requests NONE  Final   Gram Stain   Final    NO WBC SEEN NO ORGANISMS SEEN Performed at [facility], [address], [place], [place] [address]    Culture RARE STAPHYLOCOCCUS EPIDERMIDIS  Final   Report Status PENDING  Incomplete   Organism ID, Bacteria STAPHYLOCOCCUS EPIDERMIDIS  Final      Susceptibility   Staphylococcus epidermidis - MIC*    CIPROFLOXACIN 4 RESISTANT Resistant     ERYTHROMYCIN >=8 RESISTANT Resistant     GENTAMICIN <=0.5 SENSITIVE Sensitive     OXACILLIN >=4 RESISTANT Resistant     TETRACYCLINE 2 SENSITIVE Sensitive     VANCOMYCIN 1 SENSITIVE Sensitive     TRIMETH/SULFA <=10 SENSITIVE Sensitive     CLINDAMYCIN >=8 RESISTANT Resistant     RIFAMPIN <=0.5 SENSITIVE Sensitive     Inducible Clindamycin NEGATIVE Sensitive     * RARE STAPHYLOCOCCUS EPIDERMIDIS  Aerobic Culture (superficial specimen) w precautions panel     Status: None (Preliminary result)   Collection Time: 07/23/18  4:16 PM  Result Value Ref Range Status   Specimen Description EAR RIGHT RAW AREA BEHIND EAR  Final   Special Requests NONE  Final   Gram Stain NO WBC SEEN NO ORGANISMS SEEN   Final   Culture   Final    RARE NORMAL SKIN FLORA Performed at [facility], [place] [address] [place], [place] [address]    Report Status PENDING  Incomplete     Labs: BNP (last 3 results) No results for input(s): BNP in the last 8760 hours. Basic Metabolic Panel: Recent Labs  Lab 07/20/18 1644 07/21/18 0710 07/22/18 0331 07/23/18 0407 07/25/18 0424  NA 135 136 136 141 142  K 3.7 3.4* 3.5 4.1 3.6  CL 108 108 113* 118* 117*  CO2 21* 19* 19* 19* 22  GLUCOSE 132* 119* 97 107* 117*  BUN 6 12 13 10  5*  CREATININE 0.83 1.06 1.29* 1.54* 1.32*  CALCIUM 8.1* 7.3* 6.8* 7.1* 7.5*  PHOS  --   --   --   1.8*  --    Liver Function Tests: Recent Labs  Lab 07/20/18 1644 07/23/18 0407  AST 27  --   ALT 23  --   ALKPHOS 48  --   BILITOT 0.6  --   PROT 6.3*  --   ALBUMIN 3.2* 2.2*   No results for input(s): LIPASE, AMYLASE in the last 168 hours. No results for input(s): AMMONIA in the last 168 hours. CBC: Recent Labs  Lab 07/20/18 1644 07/21/18 0710 07/22/18 0331 07/23/18 0407 07/24/18 0330 07/25/18 0424  WBC 4.2 5.2 4.0 3.5* 3.4* 3.9*  NEUTROABS 3.6 4.8 3.5 2.5 2.2  --   HGB 11.3* 10.7* 9.0* 8.8* 8.8* 8.7*  HCT 35.7* 32.8* 29.8* 29.5* 28.5* 28.6*  MCV 92.7 94.3 95.2 95.2 93.8 95.7  PLT 99* 78* 60* 54* 54* 52*   Cardiac Enzymes: No results for input(s): CKTOTAL, CKMB, CKMBINDEX, TROPONINI in the last 168 hours. BNP: Invalid  input(s): POCBNP CBG: No results for input(s): GLUCAP in the last 168 hours. D-Dimer No results for input(s): DDIMER in the last 72 hours. Hgb A1c No results for input(s): HGBA1C in the last 72 hours. Lipid Profile No results for input(s): CHOL, HDL, LDLCALC, TRIG, CHOLHDL, LDLDIRECT in the last 72 hours. Thyroid function studies No results for input(s): TSH, T4TOTAL, T3FREE, THYROIDAB in the last 72 hours.  Invalid input(s): FREET3 Anemia work up No results for input(s): VITAMINB12, FOLATE, FERRITIN, TIBC, IRON, RETICCTPCT in the last 72 hours. Urinalysis    Component Value Date/Time   COLORURINE YELLOW 07/03/2018 0017   APPEARANCEUR CLEAR 07/03/2018 0017   LABSPEC 1.026 07/03/2018 0017   PHURINE 5.0 07/03/2018 0017   GLUCOSEU NEGATIVE 07/03/2018 0017   HGBUR NEGATIVE 07/03/2018 0017   BILIRUBINUR NEGATIVE 07/03/2018 0017   KETONESUR NEGATIVE 07/03/2018 0017   PROTEINUR NEGATIVE 07/03/2018 0017   NITRITE NEGATIVE 07/03/2018 0017   LEUKOCYTESUR NEGATIVE 07/03/2018 0017   Sepsis Labs Invalid input(s): PROCALCITONIN,  WBC,  LACTICIDVEN Microbiology Recent Results (from the past 240 hour(s))  Culture, blood (Routine X 2) w Reflex to ID  Panel     Status: None (Preliminary result)   Collection Time: 07/21/18  8:53 AM  Result Value Ref Range Status   Specimen Description PORTA CATH BOTTLES DRAWN AEROBIC AND ANAEROBIC  Final   Special Requests Blood Culture adequate volume  Final   Culture   Final    NO GROWTH 4 DAYS Performed at [facility], [address] [place], [place] [address]    Report Status PENDING  Incomplete  Culture, blood (Routine X 2) w Reflex to ID Panel     Status: None (Preliminary result)   Collection Time: 07/21/18  8:56 AM  Result Value Ref Range Status   Specimen Description BLOOD LEFT ARM BOTTLES DRAWN AEROBIC AND ANAEROBIC  Final   Special Requests Blood Culture adequate volume  Final   Culture   Final    NO GROWTH 4 DAYS Performed at [facility], [address] [place], [place] [address]    Report Status PENDING  Incomplete  C difficile quick scan w PCR reflex     Status: None   Collection Time: 07/21/18 12:27 PM  Result Value Ref Range Status   C Diff antigen NEGATIVE NEGATIVE Final   C Diff toxin NEGATIVE NEGATIVE Final   C Diff interpretation No C. difficile detected.  Final    Comment: Performed at [facility], [address], [place], [place] [address]  Aerobic Culture (superficial specimen) w precautions panel     Status: None (Preliminary result)   Collection Time: 07/23/18  4:16 PM  Result Value Ref Range Status   Specimen Description EAR LEFT RAW AREA BEHIND EAR  Final   Special Requests NONE  Final   Gram Stain   Final    NO WBC SEEN NO ORGANISMS SEEN Performed at [facility], [address] [place], [place] [address]    Culture RARE STAPHYLOCOCCUS EPIDERMIDIS  Final   Report Status PENDING  Incomplete   Organism ID, Bacteria STAPHYLOCOCCUS EPIDERMIDIS  Final      Susceptibility   Staphylococcus epidermidis - MIC*    CIPROFLOXACIN 4 RESISTANT Resistant     ERYTHROMYCIN >=8 RESISTANT Resistant     GENTAMICIN <=0.5 SENSITIVE Sensitive     OXACILLIN >=4 RESISTANT  Resistant     TETRACYCLINE 2 SENSITIVE Sensitive     VANCOMYCIN 1 SENSITIVE Sensitive     TRIMETH/SULFA <=10 SENSITIVE  Sensitive     CLINDAMYCIN >=8 RESISTANT Resistant     RIFAMPIN <=0.5 SENSITIVE Sensitive     Inducible Clindamycin NEGATIVE Sensitive     * RARE STAPHYLOCOCCUS EPIDERMIDIS  Aerobic Culture (superficial specimen) w precautions panel     Status: None (Preliminary result)   Collection Time: 07/23/18  4:16 PM  Result Value Ref Range Status   Specimen Description EAR RIGHT RAW AREA BEHIND EAR  Final   Special Requests NONE  Final   Gram Stain NO WBC SEEN NO ORGANISMS SEEN   Final   Culture   Final    RARE NORMAL SKIN FLORA Performed at [facility], [place] [address] [place], [place] [address]    Report Status PENDING  Incomplete     Time coordinating discharge: 28  SIGNED:   Cristy Folks, MD  [facility] 07/25/2018, 12:17 PM  If 7PM-7AM, please contact night-coverage [email or web address] Password [email or web address]

## 2018-07-25 NOTE — Progress Notes (Signed)
[place] will provide Home Infusion Pharm[facility] services and Home Health RN for home IV ABX.  Teaching provided regarding IV ABX administration  with pt and wife. Wife did very well and feels confident to administer doses at home. [facility] H[facility] will continue teaching in the home following DC.  [facility] is prepared for DC home today to support PM dose tonight.   If patient discharges after hours, please call [phone].   Larry Sierras 07/25/2018, 9:15 AM

## 2018-07-25 NOTE — Progress Notes (Signed)
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 tolerated radiotherapy for his extensive stage lung cancer to the sites listed above. He did have a brisk erythema along the scalp from his whole brain radiotherapy, however developed otitis external requiring oral antibiotics. He was just discharged from the hospital and is being discharged with 2 weeks of IV abx. He is to see Dr. Constance Holster in the office tomorrow to address the debris in his left ear. He comes today for a skin check.                             On review of systems, the patient states he is itchy and has dry flaky skin. He has been using over the counter moisturizing lotion and has also been itching at night time. No moist desquamation is noted. He reports he cannot hear out of his left ear.  ALLERGIES:  is allergic to xgeva [denosumab] and tecentriq [atezolizumab].  Meds: Current Outpatient Medications  Medication Sig Dispense Refill  . albuterol (PROVENTIL HFA;VENTOLIN HFA) 108 (90 BASE) MCG/ACT inhaler Inhale 2 puffs into the lungs every 6 (six) hours as needed for wheezing or shortness of breath.     Marland Kitchen albuterol (PROVENTIL) (2.5 MG/3ML) 0.083% nebulizer solution Take 2.5 mg by nebulization every 6 (six) hours as needed for wheezing or shortness of breath.     . alprazolam (XANAX) 2 MG tablet Take 2 mg by mouth every 6 (six) hours as needed for sleep or anxiety.     Marland Kitchen ceFEPime (MAXIPIME) IVPB Inject 2 g into the vein every 12 (twelve) hours. Indication:  malignant otitis media of L ear Last Day of Therapy:   08/08/2018 Labs - Once weekly:  CBC/D and BMP, Labs - Every other week:  ESR and CRP 28 Units 0  . cyclobenzaprine (FLEXERIL) 10 MG tablet Take 10 mg by mouth daily as needed for muscle spasms.     . diclofenac sodium (VOLTAREN) 1 % GEL Apply 2 g topically 4 (four) times daily. Apply to left forearm phlebitic area 100 g 1  . fluconazole (DIFLUCAN) 200 MG tablet Take 1 tablet (200 mg total) by mouth daily. 5 tablet 0  . Ipilimumab (YERVOY IV) Inject into the vein.    Marland Kitchen levETIRAcetam (KEPPRA) 500 MG tablet Take 500 mg by mouth daily.     Marland Kitchen lidocaine (LIDODERM) 5 % Place 1 patch onto the skin daily. Remove & Discard patch within 12 hours or as directed by MD 30 patch 0  . lidocaine (XYLOCAINE) 2 % solution Use as directed 15 mLs in the mouth or throat every 4 (four) hours as needed for mouth pain. 480 mL 1  . lidocaine-prilocaine (EMLA) cream APPLY TO PORTACATH SITE AS DIRECTED 30 g 0  . Morphine Sulfate (MORPHINE CONCENTRATE) 10 mg / 0.5 ml concentrated solution Take 0.5 mLs (10 mg total) by mouth every 6 (six) hours as needed for severe pain. 30 mL 0  . MULTIPLE VITAMINS PO Take 1 tablet by mouth  daily.     . Nivolumab (OPDIVO IV) Inject into the vein.    Marland Kitchen oxyCODONE (ROXICODONE) 15 MG immediate release tablet Take 15 mg by mouth 4 (four) times daily as needed for pain.     . pantoprazole (PROTONIX) 40 MG tablet Take 40 mg by mouth at bedtime.     . sucralfate (CARAFATE) 1 g tablet Take 1 tablet (1 g total) by mouth 4 (four) times daily. 120 tablet 2  . tamsulosin (FLOMAX) 0.4 MG CAPS capsule Take 0.4 mg by mouth daily.     Marland Kitchen triamcinolone cream (KENALOG) 0.1 % Apply 1 application topically 2 (two) times daily.     . fluticasone (FLONASE) 50 MCG/ACT nasal spray Place 2 sprays into both nostrils daily as needed for allergies.     Marland Kitchen meclizine (ANTIVERT) 12.5 MG tablet Take 1 tablet (12.5 mg total) by mouth 3 (three) times daily as needed for dizziness. (Patient not taking: Reported on 07/25/2018) 30  tablet 0  . pravastatin (PRAVACHOL) 20 MG tablet Take 20 mg by mouth daily.     . temazepam (RESTORIL) 30 MG capsule TAKE 1 CAPSULE AT BEDTIME AS NEEDED FOR SLEEP (Patient not taking: Reported on 07/25/2018) 30 capsule 0   No current facility-administered medications for this encounter.    Facility-Administered Medications Ordered in Other Encounters  Medication Dose Route Frequency Provider Last Rate Last Dose  . 0.9 %  sodium chloride infusion   Intravenous Continuous Derek Jack, MD 500 mL/hr at 07/16/18 1234    . morphine 2 MG/ML injection 2 mg  2 mg Intravenous Q1H PRN Derek Jack, MD   2 mg at 07/16/18 1345    Physical Findings:  height is _0  (1.702 m) and weight is 183 lb 9.6 oz (83.3 kg). His oral temperature is 98 F (36.7 C). His blood pressure is 176/83 (abnormal) and his pulse is 64. His respiration is 18 and oxygen saturation is 96%.  Pain Assessment Pain Score: 5 (Taking oxycodone) Pain Loc: Back(Neck 5/10)/10 In general this is a well appearing caucasian male in no acute distress. He's alert and oriented x4 and appropriate throughout the examination. Cardiopulmonary assessment is negative for acute distress and he exhibits normal effort. The scalp is dry and scaly with the worst site over his forehead at the midline with cracking of the skin, and along his ears posteriorly. No erythema is noted at this time, rather hyperpigmentation consistent with a tanning radiation dermatitis. Aquaphor was applied to the forehead and to the posterior bases of his ears. Evaluation of bilateral external auditory canals are performed and there is debris bilaterally. No erythema is noted of what limited evaluation allows for. No purulent drainage is noted from the opening of the ear however, and no bleeding is noted.   Lab Findings: Lab Results  Component Value Date   WBC 3.9 (L) 07/25/2018   HGB 8.7 (L) 07/25/2018   HCT 28.6 (L) 07/25/2018   MCV 95.7 07/25/2018   PLT 52  (L) 07/25/2018     Radiographic Findings: Dg Chest 2 View  Result Date: 07/03/2018 CLINICAL DATA:  Cough, fever EXAM: CHEST - 2 VIEW COMPARISON:  05/25/2018.  Chest CT 06/04/2018. FINDINGS: Right Port-A-Cath remains in place, unchanged. Heart and mediastinal contours are within normal limits. Unable to visualize the previously seen left hilar and AP window adenopathy on prior CT. No focal opacities or effusions. No acute bony abnormality. IMPRESSION: No active cardiopulmonary disease. Electronically Signed   By: Rolm Baptise M.D.  On: 07/03/2018 01:19   Ct Head W Or Wo Contrast  Result Date: 07/20/2018 CLINICAL DATA:  Tachycardia, low-grade fever with throat pain. Recent diagnosis of otitis media. Assess for mastoiditis and follow-up intracranial metastasis. History of metastatic small cell lung cancer, hypertension, seizures and stroke. EXAM: CT HEAD WITHOUT AND WITH CONTRAST TECHNIQUE: Contiguous axial images were obtained from the base of the skull through the vertex without and with intravenous contrast CONTRAST:  45m OMNIPAQUE IOHEXOL 300 MG/ML  SOLN COMPARISON:  MRI of the head June 28, 2018 FINDINGS: BRAIN: No intraparenchymal hemorrhage, mass effect nor midline shift. Mild parenchymal brain volume loss. No hydrocephalus. No acute large vascular territory infarcts. No abnormal extra-axial fluid collections. Basal cisterns are patent. No enhancing intracranial metastasis though, likely below the contrast resolution of CT. VASCULAR: Unremarkable. SKULL/SOFT TISSUES: No skull fracture. No significant soft tissue swelling. ORBITS/SINUSES: The included ocular globes and orbital contents are normal.Trace paranasal sinus mucosal thickening. LEFT middle ear effusion, incompletely evaluated. Soft tissue effacing LEFT external auditory canal. Mastoid air cells are well aerated. Status post RIGHT ocular lens implant. OTHER: None. IMPRESSION: 1. LEFT middle ear effusion and LEFT external auditory canal  soft tissue effacement. No CT findings of mastoiditis. 2. No acute intracranial process. Mild parenchymal brain volume loss for age. 3. 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 Electronically Signed   By: CElon AlasM.D.   On: 07/20/2018 18:35   Mr BJeri CosWRCContrast  Result Date: 06/28/2018 CLINICAL DATA:  57year old male with small cell carcinoma. Disease progression in the chest last month. Difficulty talking, weakness. No prior whole brain radiation. EXAM: MRI HEAD WITHOUT AND WITH CONTRAST TECHNIQUE: Multiplanar, multiecho pulse sequences of the brain and surrounding structures were obtained without and with intravenous contrast. CONTRAST:  10 milliliters Gadavist COMPARISON:  Brain MRI 04/04/2018 and earlier. FINDINGS: Brain: There are four small round foci of abnormal diffusion identified in the brain today including both cerebral hemispheres and 1 area in the right cerebellum. Two of these appear restricted on ADC while 2 are not. Small round T2 hyperintense corresponding lesions are identified on series 7, and each lesion enhances to a varying degree. In particular, the right cerebellar lesion on series 12, image 27 and the left middle frontal gyrus lesion on image 72 have both T2 signal and enhancement which is most compatible with small metastases. A right middle frontal gyrus lesion is only faintly evident postcontrast seen best on series 12, image 76. A left superior frontal gyrus lesion is best seen on series 13, image 13. Minimal associated cerebral edema at the cerebellar lesion. No mass effect. A 5th small round enhancing lesion without obvious diffusion abnormality is seen at the left inferior frontal gyrus on series 14, image 15. No dural thickening or leptomeningeal enhancement. No convincing restricted diffusion suggestive of acute infarction. No midline shift, mass effect, ventriculomegaly, extra-axial collection or acute intracranial hemorrhage.  Cervicomedullary junction and pituitary are within normal limits. Background gray and white matter signal remains normal. No chronic cerebral blood products. Vascular: Major intracranial vascular flow voids are stable and within normal limits. Major dural venous sinuses are enhancing and appear patent. Skull and upper cervical spine: Partially visible cervical ACDF. Negative visible cervical spinal cord. No enhancing or destructive skull lesion identified. Sinuses/Orbits: Stable and negative. Other: Mastoids remain clear. Visible internal auditory structures appear normal. Scalp and face soft tissues appear negative. IMPRESSION: 1. Five new small round enhancing brain lesions are most compatible with small Brain  Metastases. Minimal edema and no mass effect at this time. All lesions are annotated on axial series 12, although 2 lesions are better demonstrated on the sagittal and coronal postcontrast images. 2. No other acute intracranial abnormality. These results will be called to the ordering clinician or representative by the Radiologist Assistant, and communication documented in the PACS or zVision Dashboard. Electronically Signed   By: Genevie Ann M.D.   On: 06/28/2018 08:48    Impression/Plan: 1.  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 The patient will return next month for a 1 month follow up. He will call sooner with questions or concerns prior to that visit. We will outline discussion of surveillance of his brain as well at that time. 2. Radiation dermatitis. We discussed aquaphor prn dryness and prn benadryl for itching. Samples of aquaphor were provided as well.  3. Otitis externa. He will continue IV abx and follow up with Dr. Constance Holster tomorrow as well. We will follow this expectantly.     Carola Rhine, [facility] Seen with and dictated for Jodelle Gross, MD, PhD

## 2018-07-25 NOTE — Care Management Important Message (Signed)
Important Message  Patient Details  Name: [name] [name] MRN: [id number] Date of Birth: 07/17/1961   Medicare Important Message Given:  Yes    Orbie Pyo 07/25/2018, 3:24 PM

## 2018-07-25 NOTE — Discharge Instructions (Signed)
Otitis Externa Otitis externa is an infection of the outer ear canal. The outer ear canal is the area between the outside of the ear and the eardrum. Otitis externa is sometimes called "swimmer's ear." Follow these instructions at home:  If you were given antibiotic ear drops, use them as told by your doctor. Do not stop using them even if your condition gets better.  Take over-the-counter and prescription medicines only as told by your doctor.  Keep all follow-up visits as told by your doctor. This is important. How is this prevented?  Keep your ear dry. Use the corner of a towel to dry your ear after you swim or bathe.  Try not to scratch or put things in your ear. Doing these things makes it easier for germs to grow in your ear.  Avoid swimming in lakes, dirty water, or pools that may not have the right amount of a chemical called chlorine.  Consider making ear drops and putting 3 or 4 drops in each ear after you swim. Ask your doctor about how you can make ear drops. Contact a doctor if:  You have a fever.  After 3 days your ear is still red, swollen, or painful.  After 3 days you still have pus coming from your ear.  Your redness, swelling, or pain gets worse.  You have a really bad headache.  You have redness, swelling, pain, or tenderness behind your ear. This information is not intended to replace advice given to you by your health care provider. Make sure you discuss any questions you have with your health care provider. Document Released: 02/15/2008 Document Revised: 09/24/2015 Document Reviewed: 06/08/2015 Elsevier Interactive Patient Education  2018 [employer] Inc[employer]  

## 2018-07-25 NOTE — Progress Notes (Signed)
Patient discharged to home. Verbalizes understanding of all discharge instructions including discharge medications and follow up MD visits. Patient accompanied by wife.

## 2018-07-26 DIAGNOSIS — C3490 Malignant neoplasm of unspecified part of unspecified bronchus or lung: Secondary | ICD-10-CM | POA: Diagnosis not present

## 2018-07-26 DIAGNOSIS — E785 Hyperlipidemia, unspecified: Secondary | ICD-10-CM | POA: Diagnosis not present

## 2018-07-26 DIAGNOSIS — H6022 Malignant otitis externa, left ear: Secondary | ICD-10-CM | POA: Diagnosis not present

## 2018-07-26 DIAGNOSIS — H60313 Diffuse otitis externa, bilateral: Secondary | ICD-10-CM | POA: Diagnosis not present

## 2018-07-26 DIAGNOSIS — K5790 Diverticulosis of intestine, part unspecified, without perforation or abscess without bleeding: Secondary | ICD-10-CM | POA: Diagnosis not present

## 2018-07-26 DIAGNOSIS — K219 Gastro-esophageal reflux disease without esophagitis: Secondary | ICD-10-CM | POA: Diagnosis not present

## 2018-07-26 DIAGNOSIS — G4730 Sleep apnea, unspecified: Secondary | ICD-10-CM | POA: Diagnosis not present

## 2018-07-26 DIAGNOSIS — B957 Other staphylococcus as the cause of diseases classified elsewhere: Secondary | ICD-10-CM | POA: Diagnosis not present

## 2018-07-26 LAB — CULTURE, BLOOD (ROUTINE X 2)
CULTURE: NO GROWTH
Culture: NO GROWTH
Special Requests: ADEQUATE
Special Requests: ADEQUATE

## 2018-07-26 LAB — HSV CULTURE AND TYPING

## 2018-07-27 DIAGNOSIS — L590 Erythema ab igne [dermatitis ab igne]: Secondary | ICD-10-CM | POA: Diagnosis not present

## 2018-07-27 DIAGNOSIS — I10 Essential (primary) hypertension: Secondary | ICD-10-CM | POA: Diagnosis not present

## 2018-07-27 DIAGNOSIS — Z6827 Body mass index (BMI) 27.0-27.9, adult: Secondary | ICD-10-CM | POA: Diagnosis not present

## 2018-07-27 DIAGNOSIS — H6022 Malignant otitis externa, left ear: Secondary | ICD-10-CM | POA: Diagnosis not present

## 2018-07-27 DIAGNOSIS — A419 Sepsis, unspecified organism: Secondary | ICD-10-CM | POA: Diagnosis not present

## 2018-07-30 ENCOUNTER — Inpatient Hospital Stay (HOSPITAL_COMMUNITY): Payer: Medicare HMO

## 2018-07-30 DIAGNOSIS — Z5112 Encounter for antineoplastic immunotherapy: Secondary | ICD-10-CM | POA: Diagnosis present

## 2018-07-30 DIAGNOSIS — Z87891 Personal history of nicotine dependence: Secondary | ICD-10-CM | POA: Diagnosis not present

## 2018-07-30 DIAGNOSIS — C3492 Malignant neoplasm of unspecified part of left bronchus or lung: Secondary | ICD-10-CM | POA: Diagnosis not present

## 2018-07-30 DIAGNOSIS — Z5181 Encounter for therapeutic drug level monitoring: Secondary | ICD-10-CM | POA: Diagnosis not present

## 2018-07-30 DIAGNOSIS — Z8673 Personal history of transient ischemic attack (TIA), and cerebral infarction without residual deficits: Secondary | ICD-10-CM | POA: Diagnosis not present

## 2018-07-30 DIAGNOSIS — I10 Essential (primary) hypertension: Secondary | ICD-10-CM | POA: Diagnosis not present

## 2018-07-30 DIAGNOSIS — Z79899 Other long term (current) drug therapy: Secondary | ICD-10-CM | POA: Diagnosis not present

## 2018-07-30 DIAGNOSIS — C7951 Secondary malignant neoplasm of bone: Secondary | ICD-10-CM | POA: Diagnosis not present

## 2018-07-30 DIAGNOSIS — H6022 Malignant otitis externa, left ear: Secondary | ICD-10-CM | POA: Diagnosis not present

## 2018-07-30 DIAGNOSIS — K219 Gastro-esophageal reflux disease without esophagitis: Secondary | ICD-10-CM | POA: Diagnosis not present

## 2018-07-30 DIAGNOSIS — F419 Anxiety disorder, unspecified: Secondary | ICD-10-CM | POA: Diagnosis not present

## 2018-07-30 DIAGNOSIS — K5790 Diverticulosis of intestine, part unspecified, without perforation or abscess without bleeding: Secondary | ICD-10-CM | POA: Diagnosis not present

## 2018-07-30 DIAGNOSIS — B957 Other staphylococcus as the cause of diseases classified elsewhere: Secondary | ICD-10-CM | POA: Diagnosis not present

## 2018-07-30 DIAGNOSIS — R21 Rash and other nonspecific skin eruption: Secondary | ICD-10-CM | POA: Diagnosis not present

## 2018-07-30 DIAGNOSIS — E785 Hyperlipidemia, unspecified: Secondary | ICD-10-CM | POA: Diagnosis not present

## 2018-07-30 DIAGNOSIS — C3490 Malignant neoplasm of unspecified part of unspecified bronchus or lung: Secondary | ICD-10-CM

## 2018-07-30 DIAGNOSIS — Z9221 Personal history of antineoplastic chemotherapy: Secondary | ICD-10-CM | POA: Diagnosis not present

## 2018-07-30 DIAGNOSIS — Z7952 Long term (current) use of systemic steroids: Secondary | ICD-10-CM | POA: Diagnosis not present

## 2018-07-30 DIAGNOSIS — J449 Chronic obstructive pulmonary disease, unspecified: Secondary | ICD-10-CM | POA: Diagnosis not present

## 2018-07-30 DIAGNOSIS — L309 Dermatitis, unspecified: Secondary | ICD-10-CM | POA: Diagnosis not present

## 2018-07-30 DIAGNOSIS — G4730 Sleep apnea, unspecified: Secondary | ICD-10-CM | POA: Diagnosis not present

## 2018-07-30 LAB — COMPREHENSIVE METABOLIC PANEL
ALBUMIN: 3.3 g/dL — AB (ref 3.5–5.0)
ALT: 31 U/L (ref 0–44)
AST: 30 U/L (ref 15–41)
Alkaline Phosphatase: 51 U/L (ref 38–126)
Anion gap: 9 (ref 5–15)
BUN: 10 mg/dL (ref 6–20)
CHLORIDE: 109 mmol/L (ref 98–111)
CO2: 22 mmol/L (ref 22–32)
Calcium: 8.3 mg/dL — ABNORMAL LOW (ref 8.9–10.3)
Creatinine, Ser: 1.01 mg/dL (ref 0.61–1.24)
GFR calc Af Amer: >60 mL/min (ref >60)
GFR calc non Af Amer: >60 mL/min (ref >60)
GLUCOSE: 97 mg/dL (ref 70–99)
POTASSIUM: 3.2 mmol/L — AB (ref 3.5–5.1)
SODIUM: 140 mmol/L (ref 135–145)
Total Bilirubin: 0.8 mg/dL (ref 0.3–1.2)
Total Protein: 6.7 g/dL (ref 6.5–8.1)

## 2018-07-30 LAB — CBC WITH DIFFERENTIAL/PLATELET
Abs Immature Granulocytes: 0.02 10*3/uL (ref 0.00–0.07)
BASOS ABS: 0 10*3/uL (ref 0.0–0.1)
Basophils Relative: 1 %
EOS PCT: 8 %
Eosinophils Absolute: 0.3 10*3/uL (ref 0.0–0.5)
HCT: 31.8 % — ABNORMAL LOW (ref 39.0–52.0)
HEMOGLOBIN: 10 g/dL — AB (ref 13.0–17.0)
Immature Granulocytes: 1 %
LYMPHS PCT: 11 %
Lymphs Abs: 0.5 10*3/uL — ABNORMAL LOW (ref 0.7–4.0)
MCH: 29.8 pg (ref 26.0–34.0)
MCHC: 31.4 g/dL (ref 30.0–36.0)
MCV: 94.6 fL (ref 80.0–100.0)
Monocytes Absolute: 0.5 10*3/uL (ref 0.1–1.0)
Monocytes Relative: 13 %
NEUTROS ABS: 2.9 10*3/uL (ref 1.7–7.7)
Neutrophils Relative %: 66 %
PLATELETS: 70 10*3/uL — AB (ref 150–400)
RBC: 3.36 MIL/uL — AB (ref 4.22–5.81)
RDW: 15.4 % (ref 11.5–15.5)
WBC: 4.2 10*3/uL (ref 4.0–10.5)
nRBC: 0 % (ref 0.0–0.2)

## 2018-07-30 LAB — TSH: TSH: 0.976 u[IU]/mL (ref 0.350–4.500)

## 2018-07-30 LAB — LACTATE DEHYDROGENASE: LDH: 317 U/L — AB (ref 98–192)

## 2018-07-30 MED ORDER — SODIUM CHLORIDE 0.9% FLUSH
10.0000 mL | Freq: Once | INTRAVENOUS | Status: AC
  Administered 2018-07-30: 10 mL via INTRAVENOUS

## 2018-07-30 NOTE — Patient Instructions (Signed)
[facility] _______________________________________________________________  Thank you for choosing [facility] to provide your oncology and hematology care.  To afford each patient quality time with our providers, please arrive at least 15 minutes before your scheduled appointment.  You need to re-schedule your appointment if you arrive 10 or more minutes late.  We strive to give you quality time with our providers, and arriving late affects you and other patients whose appointments are after yours.  Also, if you no show three or more times for appointments you may be dismissed from the clinic.  Again, thank you for choosing [facility]. Our hope is that these requests will allow you access to exceptional care and in a timely manner. _______________________________________________________________  If you have questions after your visit, please contact our office at [phone] between the hours of 8:30 a.m. and 5:00 p.m. Voicemails left after 4:30 p.m. will not be returned until the following business day. _______________________________________________________________  For prescription refill requests, have your pharmacy contact our office. _______________________________________________________________  Recommendations made by the consultant and any test results will be sent to your referring physician. _______________________________________________________________ 

## 2018-07-30 NOTE — Progress Notes (Signed)
[name] [name] presents today for port needle change and lab work. Port deaccessed and reaccessed without incident. Lab work drawn for treatment on Friday. VSS. Discharged self ambulatory in satisfactory condition in presence of wife.

## 2018-08-02 DIAGNOSIS — H60312 Diffuse otitis externa, left ear: Secondary | ICD-10-CM | POA: Diagnosis not present

## 2018-08-03 ENCOUNTER — Encounter (HOSPITAL_COMMUNITY): Payer: Self-pay | Admitting: Hematology

## 2018-08-03 ENCOUNTER — Inpatient Hospital Stay (HOSPITAL_BASED_OUTPATIENT_CLINIC_OR_DEPARTMENT_OTHER): Payer: Medicare HMO | Admitting: Hematology

## 2018-08-03 ENCOUNTER — Inpatient Hospital Stay (HOSPITAL_COMMUNITY): Payer: Medicare HMO

## 2018-08-03 ENCOUNTER — Other Ambulatory Visit (HOSPITAL_COMMUNITY): Payer: Medicare HMO

## 2018-08-03 VITALS — BP 120/66 | HR 66 | Temp 98.0°F | Resp 18 | Wt 171.5 lb

## 2018-08-03 DIAGNOSIS — C3492 Malignant neoplasm of unspecified part of left bronchus or lung: Secondary | ICD-10-CM

## 2018-08-03 DIAGNOSIS — C3490 Malignant neoplasm of unspecified part of unspecified bronchus or lung: Secondary | ICD-10-CM

## 2018-08-03 DIAGNOSIS — Z5112 Encounter for antineoplastic immunotherapy: Secondary | ICD-10-CM | POA: Diagnosis not present

## 2018-08-03 DIAGNOSIS — R21 Rash and other nonspecific skin eruption: Secondary | ICD-10-CM | POA: Diagnosis not present

## 2018-08-03 DIAGNOSIS — L309 Dermatitis, unspecified: Secondary | ICD-10-CM

## 2018-08-03 DIAGNOSIS — Z9221 Personal history of antineoplastic chemotherapy: Secondary | ICD-10-CM | POA: Diagnosis not present

## 2018-08-03 DIAGNOSIS — Z87891 Personal history of nicotine dependence: Secondary | ICD-10-CM | POA: Diagnosis not present

## 2018-08-03 DIAGNOSIS — C7951 Secondary malignant neoplasm of bone: Secondary | ICD-10-CM

## 2018-08-03 DIAGNOSIS — Z79899 Other long term (current) drug therapy: Secondary | ICD-10-CM

## 2018-08-03 DIAGNOSIS — Z7189 Other specified counseling: Secondary | ICD-10-CM

## 2018-08-03 MED ORDER — FAMOTIDINE IN NACL 20-0.9 MG/50ML-% IV SOLN
20.0000 mg | Freq: Once | INTRAVENOUS | Status: DC
  Filled 2018-08-03: qty 50

## 2018-08-03 MED ORDER — ZOLEDRONIC ACID 4 MG/100ML IV SOLN
4.0000 mg | Freq: Once | INTRAVENOUS | Status: DC
  Filled 2018-08-03: qty 100

## 2018-08-03 MED ORDER — SODIUM CHLORIDE 0.9 % IV SOLN
1.0000 mg/kg | Freq: Once | INTRAVENOUS | Status: AC
  Administered 2018-08-03: 85 mg via INTRAVENOUS
  Filled 2018-08-03: qty 17

## 2018-08-03 MED ORDER — DIPHENHYDRAMINE HCL 50 MG/ML IJ SOLN
25.0000 mg | Freq: Once | INTRAMUSCULAR | Status: AC
  Administered 2018-08-03: 25 mg via INTRAVENOUS
  Filled 2018-08-03: qty 1

## 2018-08-03 MED ORDER — FAMOTIDINE IN NACL 20-0.9 MG/50ML-% IV SOLN: 20.0000 mg | Freq: Once | INTRAVENOUS | Status: DC

## 2018-08-03 MED ORDER — HEPARIN SOD (PORK) LOCK FLUSH 100 UNIT/ML IV SOLN: 500.0000 [IU] | Freq: Once | INTRAVENOUS | Status: DC | PRN

## 2018-08-03 MED ORDER — HYDROMORPHONE HCL 1 MG/ML IJ SOLN
1.0000 mg | Freq: Once | INTRAMUSCULAR | Status: AC
  Administered 2018-08-03: 1 mg via INTRAVENOUS

## 2018-08-03 MED ORDER — SODIUM CHLORIDE 0.9 % IV SOLN
240.0000 mg | Freq: Once | INTRAVENOUS | Status: AC
  Administered 2018-08-03: 240 mg via INTRAVENOUS
  Filled 2018-08-03: qty 24

## 2018-08-03 MED ORDER — HYDROMORPHONE HCL 1 MG/ML IJ SOLN
INTRAMUSCULAR | Status: AC
  Filled 2018-08-03: qty 1

## 2018-08-03 MED ORDER — SODIUM CHLORIDE 0.9 % IV SOLN
Freq: Once | INTRAVENOUS | Status: AC
  Administered 2018-08-03: 11:00:00 via INTRAVENOUS

## 2018-08-03 MED ORDER — FAMOTIDINE IN NACL 20-0.9 MG/50ML-% IV SOLN
20.0000 mg | Freq: Once | INTRAVENOUS | Status: AC
  Administered 2018-08-03: 20 mg via INTRAVENOUS

## 2018-08-03 NOTE — Assessment & Plan Note (Signed)
1.  Extensive stage small cell lung cancer: -4 cycles of carboplatin, etoposide and Atezolizumab from 11/27/2017 through 01/29/2018 -CT scan of the chest, abdomen after 3 cycles of chemo immunotherapy on 01/25/2018 shows near complete resolution of the adenopathy and lung mass.  Sclerosis and bone mets noted.  - Maintenance Atezolizumab started on 02/28/2018, second dose on 03/22/2018, each time resulting in severe urticarial rash on the upper trunk and lower extremities, improved on steroid therapy. -He has gotten severe urticarial rash from Atezolizumab.  I have discussed the results of the CT scan of the chest, abdomen and pelvis dated 04/06/2018 which showed no evidence of disease progression.  Previously measured mediastinal lymph nodes are barely detectable.  Dominant left hilar lymph node measures 2.1 x 0.9 cm.  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  Mild T11 compression fracture with less than 10% height loss was also seen. - I have sent him for a second opinion with Dr. Elmyra Ricks at [facility]. -He was evaluated by Dr. Berta Minor on 04/19/2018.  He was also referred to [facility] dermatology Dr. Royanne Foots. -It was suggested to give another trial of Atezolizumab.  He discontinued dexamethasone 4 mg daily and started taking prednisone 10 mg daily.  He was also prescribed triamcinolone 0.1% cream to be used twice daily. - He developed breathing difficulty with shortness of breath on exertion and on and off chest pain for the last 3 weeks.  He reportedly went to the ER on 06/04/2018 with a did a CT scan of the chest PE protocol and CT of the abdomen and pelvis. - I have reviewed the images of the CT scan independently and discussed the results with the patient.  It showed progression of the left hilar adenopathy compared to the prior exam from 2 months ago.  This currently measures about 2.3 x 4 cm in size.  Subcarinal lymph node also increased in size compared to prior exam.   No new areas were seen. - Atezolizumab discontinued after last dose on 05/18/2018 secondary to progression. - He was evaluated by Dr. Elmyra Ricks at [facility].  Combination immunotherapy with ipilimumab and nivolumab was recommended, as responses with chemotherapy is very low as his cancer has progressed within 3 months of platinum induction regimen. - Cycle 1 of combination immunotherapy was given on 06/22/2018.  Patient has been having low-grade fevers at nighttime.  He was evaluated in the ER on 07/03/2018 and all the work-up was negative.  Blood cultures did not show any growth. - MRI of the brain on 06/28/2018 showed 5 new small round enhancing brain lesions compatible with metastasis.  He finished whole brain radiation therapy on 07/13/2018. - Cycle 2 of immunotherapy on 07/13/2018. -He was admitted to the hospital from 07/20/2018 through 07/25/2018 with malignant otitis externa.  He received vancomycin and Zosyn while inpatient and is currently receiving cefepime which will finish on 08/08/2018. - We reviewed his blood work.  He has mild to moderate thrombocytopenia, likely from antibiotic.  His ears look much better with no sign of infection. -He may proceed with cycle 3 of Opdivo and Yervoy today.  We will see him back in 3 weeks for follow-up.  We will repeat CT scan of the chest, abdomen and pelvis prior to next visit. - His procalcitonin will be falsely elevated from his malignancy as well as his treatments.  Since he has no sign of active infection, he may discontinue cefepime on 08/08/2018.  2. Bone metastasis: -  He had 5 teeth pulled in the lower jaw on 07/31/2018.  We will hold his Zometa.  3.  Skin rash: - He no longer has skin rash since we started him on combination immunotherapy.

## 2018-08-03 NOTE — Progress Notes (Signed)
Pt seen and examined by Dr. Delton Coombes.  Okay to tx today per MD.   2080:  Pt requesting Dilaudid 1 mg for lower back and right hip pain.  Dr. Raliegh Ip notified and order rec'd for Dilaudid 1 mg IV. Pt medicated as ordered.   Tolerated infusions w/o adverse reaction.  Alert, in no distress.  VSS.  Discharged ambulatory in c/o spouse.

## 2018-08-03 NOTE — Progress Notes (Signed)
Nutrition Assessment   Reason for Assessment:   Patient identified on Malnutrition Screening tool for poor appetite and weight loss   ASSESSMENT:   57 year old male with lung cancer with mets to brain and bone. S/p whole brain radiation on 11/1.  Noted hospital admission from 11/8-11/13 with malignant otitis externa.  Past medical history of HTN, DM, CVD.  Met with patient and wife during infusion this am. Patient hard of hearing.  Reports that he recently had teeth pulled and hurts to eat.  Previously intake has been effected by radiation causing throat and esophageal pain with eating.  Patient has been drinking boost plus 2 a day.  Wife reports he has been eating soups, banana, pasta.  Does not like cheese. Seemed not interested in RD information.  Wife reports that she has been talking to patient about all these things for weeks.  "Maybe he will listen to what you have to say."  Noted per inpatient RD note decreased appetite for the last 2 weeks.   Nutrition Focused Physical Exam: deferred   Medications: reviewed   Labs: reviewed   Anthropometrics:   Height: 67 inches Weight: 171 lb 8 oz today UBW: 177-182 lb per inpatient RD note. Was up to 200 lb from steroids BMI: 28 12% weight loss in the last 1 months, signficant  Estimated Energy Needs  Kcals: 1950-2300 Protein: 98-115 g/d Fluid: > 1.9 L   NUTRITION DIAGNOSIS: Unintentional weight loss related to cancer related treatment side effects as evidenced by 12% weight loss in the last month, significant and poor appetite   INTERVENTION:  Discussed strategies to increase calories and protein.  Fact sheet given Discussed soft moist foods and ways to change texture of foods. Fact sheet given Encouraged high calorie oral nutrition supplement.     MONITORING, EVALUATION, GOAL: weight trends, intake   Next Visit: Tuesday, Dec 17 during infusion with Jim Like B. Zenia Resides, [place], [place] Registered Dietitian [phone]  (pager)

## 2018-08-03 NOTE — Progress Notes (Signed)
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     06/15/2018 -  Chemotherapy    The patient had ipilimumab (YERVOY) 90 mg in sodium chloride 0.9 % 50 mL chemo infusion, 1 mg/kg = 90 mg, Intravenous,  Once, 3 of 4 cycles Administration: 90 mg (06/22/2018), 90 mg (07/13/2018) nivolumab (OPDIVO) 270 mg in sodium chloride 0.9 % 100 mL chemo infusion, 3 mg/kg = 270 mg, Intravenous, Once, 3 of 8 cycles Administration: 270 mg (06/22/2018), 240 mg (07/13/2018)  for chemotherapy treatment.       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 57 y.o. male returns for routine follow-up for small cell lung cancer. He is here today with his wife. He is feeling much better since his last visit and hospital stay. He reports he is supposed to stop his antibiotics on Wednesday. He recntly had dental plates and he feels hungry but he is unable to eat  yet. He will continue to drink boost until he is able to start eating again. He denies any new pains. Denies any new cough. Denies any nausea, vomiting, or diarrhea. Denies any bleeding. He reports his appetite at 75%.     REVIEW OF SYSTEMS:  Review of Systems  Constitutional: Positive for fatigue.  Gastrointestinal: Positive for diarrhea.  All other systems reviewed and are negative.    PAST MEDICAL/SURGICAL HISTORY:  Past Medical History:  Diagnosis Date  . Anxiety   . Asthma   . COPD (chronic obstructive pulmonary disease) ([place])   . DDD (degenerative disc disease)   . Diverticulitis   . GERD (gastroesophageal reflux disease)   . Headache   . Hepatitis    in the late 70's   . HTN (hypertension)   . Mediastinal adenopathy   . Seizures ([facility])    as of 2019 none for 20 years  . Sleep apnea    does not  use cpap  . Stroke [facility])    TIA - Feb. 2018   Past Surgical History:  Procedure Laterality Date  . CERVICAL DISC SURGERY     X4  . CHOLECYSTECTOMY    . COLONOSCOPY  08/24/2011   Procedure: COLONOSCOPY;  Surgeon: Daneil Dolin, MD;  Location: [facility];  Service: Endoscopy;  Laterality: N/A;  7:30AM  . ELBOW SURGERY     right  . PORTACATH PLACEMENT Right 12/01/2017   Procedure: INSERTION PORT-A-CATH;  Surgeon: Aviva Signs, MD;  Location: [facility] ORS;  Service: General;  Laterality:  Right;  Marland Kitchen VIDEO BRONCHOSCOPY WITH ENDOBRONCHIAL ULTRASOUND N/A 10/31/2017   Procedure: VIDEO BRONCHOSCOPY WITH ENDOBRONCHIAL ULTRASOUND;  Surgeon: Ivin Poot, MD;  Location: [facility];  Service: Thoracic;  Laterality: N/A;  . VIDEO BRONCHOSCOPY WITH ENDOBRONCHIAL ULTRASOUND N/A 11/22/2017   Procedure: VIDEO BRONCHOSCOPY WITH ENDOBRONCHIAL ULTRASOUND;  Surgeon: Melrose Nakayama, MD;  Location: [facility];  Service: Thoracic;  Laterality: N/A;     SOCIAL HISTORY:  Social History   Socioeconomic History  . Marital status: Married    Spouse name: Not on file  . Number of children: Not on  file  . Years of education: Not on file  . Highest education level: Not on file  Occupational History  . Occupation: disability  Social Needs  . Financial resource strain: Not on file  . Food insecurity:    Worry: Not on file    Inability: Not on file  . Transportation needs:    Medical: No    Non-medical: No  Tobacco Use  . Smoking status: Former Smoker    Packs/day: 1.50    Years: 30.00    Pack years: 45.00    Types: Cigarettes    Last attempt to quit: 10/13/2017    Years since quitting: 0.8  . Smokeless tobacco: Never Used  Substance and Sexual Activity  . Alcohol use: No  . Drug use: No  . Sexual activity: Yes    Birth control/protection: None  Lifestyle  . Physical activity:    Days per week: Not on file    Minutes per session: Not on file  . Stress: Not on file  Relationships  . Social connections:    Talks on phone: Not on file    Gets together: Not on file    Attends religious service: Not on file    Active member of club or organization: Not on file    Attends meetings of clubs or organizations: Not on file    Relationship status: Not on file  . Intimate partner violence:    Fear of current or ex partner: Not on file    Emotionally abused: Not on file    Physically abused: Not on file    Forced sexual activity: Not on file  Other Topics Concern  . Not on file  Social History Narrative   06-20-18 Unable to ask abuse questions wife with him today.   07-25-18  Unable to ask abuse questions wife with him today.    FAMILY HISTORY:  Family History  Problem Relation Age of Onset  . Hypertension Father   . Heart attack Father   . Kidney failure Mother   . Diabetes Mother   . Hypertension Mother   . Colon cancer Neg Hx   . Anesthesia problems Neg Hx   . Hypotension Neg Hx   . Malignant hyperthermia Neg Hx   . Pseudochol deficiency Neg Hx     CURRENT MEDICATIONS:  Outpatient Encounter Medications as of 08/03/2018  Medication Sig Note  . Absorbable  Collagen Hemostat (ACTIFOAM COLLAGEN SPONGE) MISC by Misc.(Non-Drug; Combo Route) route.   Marland Kitchen albuterol (PROVENTIL HFA;VENTOLIN HFA) 108 (90 BASE) MCG/ACT inhaler Inhale 2 puffs into the lungs every 6 (six) hours as needed for wheezing or shortness of breath.    Marland Kitchen albuterol (PROVENTIL) (2.5 MG/3ML) 0.083% nebulizer solution Take 2.5 mg by nebulization every 6 (six) hours as needed for wheezing or shortness of breath.    . alprazolam (XANAX) 2 MG tablet Take 2 mg by mouth every 6 (six)  hours as needed for sleep or anxiety.  07/20/2018: Takes a 1/4 of a tablet to help sleep to prevent next day drowsiness.   . Calcium-Magnesium-Zinc 333-133-5 MG TABS Take by mouth.   . ceFEPIme (MAXIPIME) 2 g injection    . cyclobenzaprine (FLEXERIL) 10 MG tablet Take 10 mg by mouth daily as needed for muscle spasms.  07/20/2018: Takes as needed.   . diclofenac sodium (VOLTAREN) 1 % GEL Apply 2 g topically 4 (four) times daily. Apply to left forearm phlebitic area   . Flaxseed Oil (LINSEED OIL) OIL by Misc.(Non-Drug; Combo Route) route.   . fluticasone (FLONASE) 50 MCG/ACT nasal spray Place 2 sprays into both nostrils daily as needed for allergies.    . Ipilimumab (YERVOY IV) Inject into the vein. 07/20/2018: Immunotherapy every 3 weeks.   . levETIRAcetam (KEPPRA) 500 MG tablet Take 500 mg by mouth daily.    Marland Kitchen lidocaine (LIDODERM) 5 % Place 1 patch onto the skin daily. Remove & Discard patch within 12 hours or as directed by MD   . lidocaine (XYLOCAINE) 2 % solution Use as directed 15 mLs in the mouth or throat every 4 (four) hours as needed for mouth pain.   Marland Kitchen lidocaine-prilocaine (EMLA) cream APPLY TO PORTACATH SITE AS DIRECTED   . lisinopril (PRINIVIL,ZESTRIL) 20 MG tablet Take by mouth.   . meclizine (ANTIVERT) 12.5 MG tablet Take 1 tablet (12.5 mg total) by mouth 3 (three) times daily as needed for dizziness.   . MULTIPLE VITAMINS PO Take 1 tablet by mouth daily.    . naproxen (NAPROSYN) 500 MG tablet Take by mouth.    . Nivolumab (OPDIVO IV) Inject into the vein.   Marland Kitchen oxyCODONE (ROXICODONE) 15 MG immediate release tablet Take 15 mg by mouth 4 (four) times daily as needed for pain.    Marland Kitchen oxycodone (ROXICODONE) 30 MG immediate release tablet Take 30 mg by mouth every 4 (four) hours as needed for pain.   . pantoprazole (PROTONIX) 40 MG tablet Take 40 mg by mouth at bedtime.    . Polyethylene Glycol 1000 LIQD Take by mouth.   . prochlorperazine (COMPAZINE) 10 MG tablet Take by mouth.   . sucralfate (CARAFATE) 1 g tablet Take 1 tablet (1 g total) by mouth 4 (four) times daily. 07/20/2018: Takes two hours before and two hours after other medications.   . tamsulosin (FLOMAX) 0.4 MG CAPS capsule Take 0.4 mg by mouth daily.    . temazepam (RESTORIL) 30 MG capsule TAKE 1 CAPSULE AT BEDTIME AS NEEDED FOR SLEEP   . triamcinolone cream (KENALOG) 0.1 % Apply 1 application topically 2 (two) times daily.    . Turmeric POWD Take by mouth.   . [DISCONTINUED] ceFEPime (MAXIPIME) IVPB Inject 2 g into the vein every 12 (twelve) hours. Indication:  malignant otitis media of L ear Last Day of Therapy:  08/08/2018 Labs - Once weekly:  CBC/D and BMP, Labs - Every other week:  ESR and CRP   . [DISCONTINUED] fluconazole (DIFLUCAN) 200 MG tablet Take 1 tablet (200 mg total) by mouth daily. 07/20/2018: Not completed course. 2 days left. Tonight and tomorrow's dose remaining.   . [DISCONTINUED] Morphine Sulfate (MORPHINE CONCENTRATE) 10 mg / 0.5 ml concentrated solution Take 0.5 mLs (10 mg total) by mouth every 6 (six) hours as needed for severe pain. 07/20/2018: Last dose last night. Episodes of shortness of breath when last taken.   . [DISCONTINUED] pravastatin (PRAVACHOL) 20 MG tablet Take 20 mg by mouth daily.  Facility-Administered Encounter Medications as of 08/03/2018  Medication  . 0.9 %  sodium chloride infusion  . morphine 2 MG/ML injection 2 mg    ALLERGIES:  Allergies  Allergen Reactions  . Xgeva [Denosumab]     Rash     . Tecentriq [Atezolizumab] Rash     PHYSICAL EXAM:  ECOG Performance status: 1  I have reviewed his vitals.  Blood pressure is 155/77.  Temperature is 97.9.  Saturations are 100%.  Pulse rate is 93 and respiratory rate is 18. Physical Exam  Constitutional: He is oriented to person, place, and time. He appears well-developed and well-nourished.  Cardiovascular: Normal rate, regular rhythm and normal heart sounds.  Pulmonary/Chest: Effort normal and breath sounds normal.  Musculoskeletal: Normal range of motion.  Neurological: He is alert and oriented to person, place, and time.  Skin: Skin is warm and dry.  Psychiatric: He has a normal mood and affect. His behavior is normal. Judgment and thought content normal.  Both the external ear canals does not show any infection.   LABORATORY DATA:  I have reviewed the labs as listed.  CBC    Component Value Date/Time   WBC 4.2 07/30/2018 1118   RBC 3.36 (L) 07/30/2018 1118   HGB 10.0 (L) 07/30/2018 1118   HCT 31.8 (L) 07/30/2018 1118   PLT 70 (L) 07/30/2018 1118   MCV 94.6 07/30/2018 1118   MCH 29.8 07/30/2018 1118   MCHC 31.4 07/30/2018 1118   RDW 15.4 07/30/2018 1118   LYMPHSABS 0.5 (L) 07/30/2018 1118   MONOABS 0.5 07/30/2018 1118   EOSABS 0.3 07/30/2018 1118   BASOSABS 0.0 07/30/2018 1118   CMP Latest Ref Rng & Units 07/30/2018 07/25/2018 07/23/2018  Glucose 70 - 99 mg/dL 97 117(H) 107(H)  BUN 6 - 20 mg/dL 10 5(L) 10  Creatinine 0.61 - 1.24 mg/dL 1.01 1.32(H) 1.54(H)  Sodium 135 - 145 mmol/L 140 142 141  Potassium 3.5 - 5.1 mmol/L 3.2(L) 3.6 4.1  Chloride 98 - 111 mmol/L 109 117(H) 118(H)  CO2 22 - 32 mmol/L 22 22 19(L)  Calcium 8.9 - 10.3 mg/dL 8.3(L) 7.5(L) 7.1(L)  Total Protein 6.5 - 8.1 g/dL 6.7 - -  Total Bilirubin 0.3 - 1.2 mg/dL 0.8 - -  Alkaline Phos 38 - 126 U/L 51 - -  AST 15 - 41 U/L 30 - -  ALT 0 - 44 U/L 31 - -        ASSESSMENT & PLAN:   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  Extensive stage small cell lung cancer: -4 cycles of carboplatin, etoposide and Atezolizumab from 11/27/2017 through 01/29/2018 -CT scan of the chest, abdomen after 3 cycles of chemo immunotherapy on 01/25/2018 shows near complete resolution of the adenopathy and lung mass.  Sclerosis and bone mets noted.  - Maintenance Atezolizumab started on 02/28/2018, second dose on 03/22/2018, each time resulting in severe urticarial rash on the upper trunk and lower extremities, improved on steroid therapy. -He has gotten severe urticarial rash from Atezolizumab.  I have discussed the results of the CT scan of the chest, abdomen and pelvis dated 04/06/2018 which showed no evidence of disease progression.  Previously measured mediastinal lymph nodes are barely detectable.  Dominant left hilar lymph node measures 2.1 x 0.9 cm.  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  Mild T11 compression fracture with less than 10% height loss was also seen. - I have sent him for a second opinion with Dr. Nicki Reaper  Berta [facility] at [facility]. -He was evaluated by Dr. Berta Minor on 04/19/2018.  He was also referred to [facility] dermatology Dr. Royanne Foots. -It was suggested to give another trial of Atezolizumab.  He discontinued dexamethasone 4 mg daily and started taking prednisone 10 mg daily.  He was also prescribed triamcinolone 0.1% cream to be used twice daily. - He developed breathing difficulty with shortness of breath on exertion and on and off chest pain for the last 3 weeks.  He reportedly went to the ER on 06/04/2018 with a did a CT scan of the chest PE protocol and CT of the abdomen and pelvis. - I have reviewed the images of the CT scan independently and discussed the results with the patient.  It showed progression of the left hilar adenopathy compared to the prior exam from 2 months ago.  This currently measures about 2.3 x 4 cm in size.  Subcarinal lymph node also increased in size compared to prior  exam.  No new areas were seen. - Atezolizumab discontinued after last dose on 05/18/2018 secondary to progression. - He was evaluated by Dr. Elmyra Ricks at [facility].  Combination immunotherapy with ipilimumab and nivolumab was recommended, as responses with chemotherapy is very low as his cancer has progressed within 3 months of platinum induction regimen. - Cycle 1 of combination immunotherapy was given on 06/22/2018.  Patient has been having low-grade fevers at nighttime.  He was evaluated in the ER on 07/03/2018 and all the work-up was negative.  Blood cultures did not show any growth. - MRI of the brain on 06/28/2018 showed 5 new small round enhancing brain lesions compatible with metastasis.  He finished whole brain radiation therapy on 07/13/2018. - Cycle 2 of immunotherapy on 07/13/2018. -He was admitted to the hospital from 07/20/2018 through 07/25/2018 with malignant otitis externa.  He received vancomycin and Zosyn while inpatient and is currently receiving cefepime which will finish on 08/08/2018. - We reviewed his blood work.  He has mild to moderate thrombocytopenia, likely from antibiotic.  His ears look much better with no sign of infection. -He may proceed with cycle 3 of Opdivo and Yervoy today.  We will see him back in 3 weeks for follow-up.  We will repeat CT scan of the chest, abdomen and pelvis prior to next visit. - His procalcitonin will be falsely elevated from his malignancy as well as his treatments.  Since he has no sign of active infection, he may discontinue cefepime on 08/08/2018.  2. Bone metastasis: - He had 5 teeth pulled in the lower jaw on 07/31/2018.  We will hold his Zometa.  3.  Skin rash: - He no longer has skin rash since we started him on combination immunotherapy.       Orders placed this encounter:  Orders Placed This Encounter  Procedures  . CT CHEST W CONTRAST  . CT Abdomen Pelvis W Contrast  . TSH  . CBC with  Differential/Platelet  . Comprehensive metabolic panel  . Lactate dehydrogenase      Derek Jack, MD [place] [phone]

## 2018-08-03 NOTE — Patient Instructions (Signed)
[facility] at [facility] Discharge Instructions  Follow up in 3 weeks with scans and labs    Thank you for choosing [facility] at [facility] to provide your oncology and hematology care.  To afford each patient quality time with our provider, please arrive at least 15 minutes before your scheduled appointment time.   If you have a lab appointment with the [facility] please come in thru the  Main Entrance and check in at the main information desk  You need to re-schedule your appointment should you arrive 10 or more minutes late.  We strive to give you quality time with our providers, and arriving late affects you and other patients whose appointments are after yours.  Also, if you no show three or more times for appointments you may be dismissed from the clinic at the providers discretion.     Again, thank you for choosing [facility].  Our hope is that these requests will decrease the amount of time that you wait before being seen by our physicians.       _____________________________________________________________  Should you have questions after your visit to [facility], please contact our office at [phone] between the hours of 8:00 a.m. and 4:30 p.m.  Voicemails left after 4:00 p.m. will not be returned until the following business day.  For prescription refill requests, have your pharmacy contact our office and allow 72 hours.    Cancer Center Support Programs:   > Cancer Support Group  2nd Tuesday of the month 1pm-2pm, Journey Room

## 2018-08-04 DIAGNOSIS — C7931 Secondary malignant neoplasm of brain: Secondary | ICD-10-CM | POA: Insufficient documentation

## 2018-08-04 NOTE — Progress Notes (Signed)
  Radiation Oncology         (336) [phone] ________________________________  Name: [name] [name] MRN: [id number]  Date: 07/13/2018  DOB: 1961-05-21  End of Treatment Note  Diagnosis:   Small cell lung cancer     Indication for treatment::  palliative       Radiation treatment dates:   06/26/2018 through 07/13/2018  Site/dose:    1.  Left lung: The left lung was treated to a dose of 30 Gy in 10 fractions at 3 Gy per fraction.  This consisted of a 3D conformal technique 2.  The lumbar spine was treated to a dose of 30 Gy in 10 fractions at 3 Gy per fraction. 3.  The patient was treated with a course of whole brain radiation treatment to a dose of 30 Gy in 10 fractions  Narrative: The patient tolerated radiation treatment relatively well.     Plan: The patient has completed radiation treatment. The patient will return to radiation oncology clinic for routine followup in one month. I advised the patient to call or return sooner if they have any questions or concerns related to their recovery or treatment. ________________________________  Jodelle Gross, M.D., Ph.D.

## 2018-08-04 NOTE — Progress Notes (Signed)
  Radiation Oncology         [phone]) [phone] ________________________________  Name: [name] [name] MRN: [id number]  Date: 06/29/2018  DOB: May 08, 1961    Simulation and treatment planning note  DIAGNOSIS:     ICD-10-CM   1. Brain metastasis ([facility]) C79.31      The patient presented for simulation for the patient's upcoming course of whole brain radiation treatment. The patient was placed in a supine position and a customized thermoplastic head cast was constructed to aid in patient immobilization during the treatment. This complex treatment device will be used on a daily basis. In this fashion a CT scan was obtained through the head and neck region and isocenter was placed near midline within the brain.  The patient will be planned to receive a course of whole brain radiation treatment to a dose of 30 gray in 10 fractions at 3 gray per fraction. To accomplish this, 2 customized blocks have been designed which corresponds to left and right whole brain radiation fields. These 2 complex treatment devices will be used on a daily basis during the course of radiation. A complex isodose plan is requested to insure that the target area is adequately covered in to facilitate optimization of the treatment plan. A forward planning technique will also be evaluated to determine if this approach significantly improves the plan.   ________________________________   Jodelle Gross, MD, PhD

## 2018-08-04 NOTE — Progress Notes (Signed)
  Radiation Oncology         (336) [phone] ________________________________  Name: [name] [name] MRN: [id number]  Date: 06/22/2018  DOB: 11/14/1960  SIMULATION AND TREATMENT PLANNING NOTE  DIAGNOSIS:     ICD-10-CM   1. Bone metastasis ([place]) C79.51   2. Malignant neoplasm of bronchus and lung ([facility]) C34.90      Site:   1.  Left lung 2.  Lumbar spine  NARRATIVE:  The patient was brought to the [facility].  Identity was confirmed.  All relevant records and images related to the planned course of therapy were reviewed.   Written consent to proceed with treatment was confirmed which was freely given after reviewing the details related to the planned course of therapy had been reviewed with the patient.  Then, the patient was set-up in a stable reproducible  supine position for radiation therapy.  CT images were obtained.  Surface markings were placed.    Medically necessary complex treatment device(s) for immobilization:   1.  Vac-Lok bag 2.  Accu form device.   The CT images were loaded into the planning software.  Then the target and avoidance structures were contoured.  Treatment planning then occurred.  The radiation prescription was entered and confirmed.  A total of 8 complex treatment devices were fabricated which relate to the designed radiation treatment fields. Each of these customized fields/ complex treatment devices will be used on a daily basis during the radiation course. I have requested : 3D Simulation  I have requested a DVH of the following structures: Target volume, lungs, spinal cord.   The patient will undergo daily image guidance to ensure accurate localization of the target, and adequate minimize dose to the normal surrounding structures in close proximity to the target.   PLAN:  The patient will receive 30 Gy in 10 fractions.  ________________________________   Jodelle Gross, MD, PhD

## 2018-08-05 ENCOUNTER — Other Ambulatory Visit: Payer: Self-pay

## 2018-08-05 ENCOUNTER — Emergency Department (HOSPITAL_COMMUNITY): Payer: Medicare HMO

## 2018-08-05 ENCOUNTER — Emergency Department (HOSPITAL_COMMUNITY)
Admission: EM | Admit: 2018-08-05 | Discharge: 2018-08-05 | Disposition: A | Discharge status: Home / Self Care | Payer: Medicare HMO | Attending: Emergency Medicine | Admitting: Emergency Medicine

## 2018-08-05 ENCOUNTER — Encounter (HOSPITAL_COMMUNITY): Payer: Self-pay | Admitting: Emergency Medicine

## 2018-08-05 DIAGNOSIS — C7951 Secondary malignant neoplasm of bone: Secondary | ICD-10-CM | POA: Insufficient documentation

## 2018-08-05 DIAGNOSIS — Z87891 Personal history of nicotine dependence: Secondary | ICD-10-CM | POA: Insufficient documentation

## 2018-08-05 DIAGNOSIS — C7800 Secondary malignant neoplasm of unspecified lung: Secondary | ICD-10-CM | POA: Diagnosis not present

## 2018-08-05 DIAGNOSIS — C3490 Malignant neoplasm of unspecified part of unspecified bronchus or lung: Secondary | ICD-10-CM | POA: Diagnosis not present

## 2018-08-05 DIAGNOSIS — Z8673 Personal history of transient ischemic attack (TIA), and cerebral infarction without residual deficits: Secondary | ICD-10-CM | POA: Insufficient documentation

## 2018-08-05 DIAGNOSIS — R509 Fever, unspecified: Secondary | ICD-10-CM | POA: Diagnosis present

## 2018-08-05 DIAGNOSIS — I10 Essential (primary) hypertension: Secondary | ICD-10-CM | POA: Insufficient documentation

## 2018-08-05 DIAGNOSIS — Z79899 Other long term (current) drug therapy: Secondary | ICD-10-CM | POA: Diagnosis not present

## 2018-08-05 DIAGNOSIS — E119 Type 2 diabetes mellitus without complications: Secondary | ICD-10-CM | POA: Insufficient documentation

## 2018-08-05 DIAGNOSIS — J449 Chronic obstructive pulmonary disease, unspecified: Secondary | ICD-10-CM | POA: Insufficient documentation

## 2018-08-05 DIAGNOSIS — C7931 Secondary malignant neoplasm of brain: Secondary | ICD-10-CM | POA: Diagnosis not present

## 2018-08-05 DIAGNOSIS — R5081 Fever presenting with conditions classified elsewhere: Secondary | ICD-10-CM

## 2018-08-05 DIAGNOSIS — R5084 Febrile nonhemolytic transfusion reaction: Secondary | ICD-10-CM | POA: Diagnosis not present

## 2018-08-05 LAB — I-STAT CG4 LACTIC ACID, ED
Lactic Acid, Venous: 0.93 mmol/L (ref 0.5–1.9)
Lactic Acid, Venous: 0.54 mmol/L (ref 0.5–1.9)

## 2018-08-05 LAB — CBC WITH DIFFERENTIAL/PLATELET
Abs Immature Granulocytes: 0.01 10*3/uL (ref 0.00–0.07)
Basophils Absolute: 0 10*3/uL (ref 0.0–0.1)
Basophils Relative: 1 %
EOS ABS: 0.1 10*3/uL (ref 0.0–0.5)
EOS PCT: 3 %
HEMATOCRIT: 34.9 % — AB (ref 39.0–52.0)
Hemoglobin: 10.8 g/dL — ABNORMAL LOW (ref 13.0–17.0)
Immature Granulocytes: 0 %
Lymphocytes Relative: 4 %
Lymphs Abs: 0.2 10*3/uL — ABNORMAL LOW (ref 0.7–4.0)
MCH: 29.1 pg (ref 26.0–34.0)
MCHC: 30.9 g/dL (ref 30.0–36.0)
MCV: 94.1 fL (ref 80.0–100.0)
MONO ABS: 0.3 10*3/uL (ref 0.1–1.0)
MONOS PCT: 7 %
NEUTROS PCT: 85 %
Neutro Abs: 3.7 10*3/uL (ref 1.7–7.7)
PLATELETS: 98 10*3/uL — AB (ref 150–400)
RBC: 3.71 MIL/uL — ABNORMAL LOW (ref 4.22–5.81)
RDW: 15.4 % (ref 11.5–15.5)
WBC: 4.4 10*3/uL (ref 4.0–10.5)
nRBC: 0 % (ref 0.0–0.2)

## 2018-08-05 LAB — URINALYSIS, ROUTINE W REFLEX MICROSCOPIC
BACTERIA UA: NONE SEEN
Bilirubin Urine: NEGATIVE
GLUCOSE, UA: NEGATIVE mg/dL
Hgb urine dipstick: NEGATIVE
KETONES UR: 5 mg/dL — AB
LEUKOCYTES UA: NEGATIVE
Nitrite: NEGATIVE
PH: 6 (ref 5.0–8.0)
Protein, ur: 30 mg/dL — AB
SPECIFIC GRAVITY, URINE: 1.023 (ref 1.005–1.030)

## 2018-08-05 LAB — COMPREHENSIVE METABOLIC PANEL
ALBUMIN: 3.5 g/dL (ref 3.5–5.0)
ALK PHOS: 53 U/L (ref 38–126)
ALT: 18 U/L (ref 0–44)
ANION GAP: 9 (ref 5–15)
AST: 35 U/L (ref 15–41)
BILIRUBIN TOTAL: 0.7 mg/dL (ref 0.3–1.2)
BUN: 14 mg/dL (ref 6–20)
CALCIUM: 9.6 mg/dL (ref 8.9–10.3)
CO2: 25 mmol/L (ref 22–32)
CREATININE: 1.02 mg/dL (ref 0.61–1.24)
Chloride: 103 mmol/L (ref 98–111)
GFR calc Af Amer: >60 mL/min (ref >60)
GFR calc non Af Amer: >60 mL/min (ref >60)
GLUCOSE: 122 mg/dL — AB (ref 70–99)
Potassium: 4.3 mmol/L (ref 3.5–5.1)
Sodium: 137 mmol/L (ref 135–145)
TOTAL PROTEIN: 7.5 g/dL (ref 6.5–8.1)

## 2018-08-05 LAB — INFLUENZA PANEL BY PCR (TYPE A & B)
INFLAPCR: NEGATIVE
Influenza B By PCR: NEGATIVE

## 2018-08-05 MED ORDER — SODIUM CHLORIDE 0.9 % IV BOLUS
1000.0000 mL | Freq: Once | INTRAVENOUS | Status: AC
  Administered 2018-08-05: 1000 mL via INTRAVENOUS

## 2018-08-05 MED ORDER — ONDANSETRON HCL 4 MG/2ML IJ SOLN
4.0000 mg | Freq: Once | INTRAMUSCULAR | Status: AC
  Administered 2018-08-05: 4 mg via INTRAVENOUS
  Filled 2018-08-05: qty 2

## 2018-08-05 MED ORDER — VANCOMYCIN HCL IN DEXTROSE 1-5 GM/200ML-% IV SOLN
1000.0000 mg | Freq: Once | INTRAVENOUS | Status: AC
  Administered 2018-08-05: 1000 mg via INTRAVENOUS
  Filled 2018-08-05: qty 200

## 2018-08-05 MED ORDER — ACETAMINOPHEN 500 MG PO TABS
1000.0000 mg | ORAL_TABLET | Freq: Once | ORAL | Status: AC
  Administered 2018-08-05: 1000 mg via ORAL
  Filled 2018-08-05: qty 2

## 2018-08-05 MED ORDER — OXYCODONE-ACETAMINOPHEN 5-325 MG PO TABS
2.0000 | ORAL_TABLET | Freq: Once | ORAL | Status: DC
  Filled 2018-08-05: qty 2

## 2018-08-05 MED ORDER — PIPERACILLIN-TAZOBACTAM 3.375 G IVPB 30 MIN
3.3750 g | Freq: Once | INTRAVENOUS | Status: AC
  Administered 2018-08-05: 3.375 g via INTRAVENOUS
  Filled 2018-08-05: qty 50

## 2018-08-05 MED ORDER — HYDROMORPHONE HCL 1 MG/ML IJ SOLN
1.0000 mg | Freq: Once | INTRAMUSCULAR | Status: AC
  Administered 2018-08-05: 1 mg via INTRAVENOUS
  Filled 2018-08-05: qty 1

## 2018-08-05 NOTE — ED Provider Notes (Signed)
Home and continue his present antibiotic treatment. [facility] EMERGENCY DEPARTMENT Provider Note   CSN: [id number] Arrival date & time: 08/05/18  1427     History   Chief Complaint Chief Complaint  Patient presents with  . Fever    HPI [name] [name] is a 57 y.o. male.  Patient has a history of metastatic lung cancer and received immunotherapy on Friday.  He developed fevers and just aches today.  No other symptoms.  He is presently on antibiotics at home for malignant otitis externa which is improving  The history is provided by the patient. No language interpreter was used.  Fever   This is a new problem. The current episode started 6 to 12 hours ago. The problem occurs constantly. The problem has not changed since onset.The maximum temperature noted was 101 to 101.9 F. Pertinent negatives include no chest pain, no diarrhea, no congestion, no headaches and no cough. He has tried nothing for the symptoms. The treatment provided no relief.    Past Medical History:  Diagnosis Date  . Anxiety   . Asthma   . COPD (chronic obstructive pulmonary disease) ([place])   . DDD (degenerative disc disease)   . Diverticulitis   . GERD (gastroesophageal reflux disease)   . Headache   . Hepatitis    in the late 70's   . HTN (hypertension)   . Mediastinal adenopathy   . Seizures ([place])    as of 2019 none for 20 years  . Sleep apnea    does not  use cpap  . Stroke [facility])    TIA - Feb. 2018    Patient Active Problem List   Diagnosis Date Noted  . Brain metastasis ([place]) 08/04/2018  . GERD (gastroesophageal reflux disease) 07/21/2018  . COPD (chronic obstructive pulmonary disease) ([place]) 07/21/2018  . Anxiety 07/21/2018  . Sepsis due to undetermined organism ([place]) 07/21/2018  . Malignant otitis media of left ear 07/21/2018  . Diarrhea 07/21/2018  . Acute otitis externa of left ear 07/20/2018  . Bone metastasis ([place]) 01/29/2018  . Malignant neoplasm of bronchus and lung ([place])   .  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 Goals of care, counseling/discussion 11/23/2017  . Hyperlipidemia 01/03/2017  . Family history of heart disease 01/03/2017  . Blurry vision 10/26/2016  . Slurred speech 10/26/2016  . Cerebrovascular disease 10/26/2016  . Chest pain 05/10/2012  . HTN (hypertension) 05/10/2012  . Chronic pain 05/10/2012  . Diabetes mellitus ([place]) 05/10/2012  . Screening for colon cancer 07/25/2011    Past Surgical History:  Procedure Laterality Date  . CERVICAL DISC SURGERY     X4  . CHOLECYSTECTOMY    . COLONOSCOPY  08/24/2011   Procedure: COLONOSCOPY;  Surgeon: Daneil Dolin, MD;  Location: [facility] ENDO SUITE;  Service: Endoscopy;  Laterality: N/A;  7:30AM  . ELBOW SURGERY     right  . PORTACATH PLACEMENT Right 12/01/2017   Procedure: INSERTION PORT-A-CATH;  Surgeon: Aviva Signs, MD;  Location: [facility] ORS;  Service: General;  Laterality: Right;  Marland Kitchen VIDEO BRONCHOSCOPY WITH ENDOBRONCHIAL ULTRASOUND N/A 10/31/2017   Procedure: VIDEO BRONCHOSCOPY WITH ENDOBRONCHIAL ULTRASOUND;  Surgeon: Ivin Poot, MD;  Location: [place];  Service: Thoracic;  Laterality: N/A;  . VIDEO BRONCHOSCOPY WITH ENDOBRONCHIAL ULTRASOUND N/A 11/22/2017   Procedure: VIDEO BRONCHOSCOPY WITH ENDOBRONCHIAL ULTRASOUND;  Surgeon: Melrose Nakayama, MD;  Location: [facility];  Service: Thoracic;  Laterality: N/A;        Home  Medications    Prior to Admission medications   Medication Sig Start Date End Date Taking? Authorizing Provider  albuterol (PROVENTIL HFA;VENTOLIN HFA) 108 (90 BASE) MCG/ACT inhaler Inhale 2 puffs into the lungs every 6 (six) hours as needed for wheezing or shortness of breath.    Yes [provider]  albuterol (PROVENTIL) (2.5 MG/3ML) 0.083% nebulizer solution Take 2.5 mg by nebulization every 6 (six) hours as needed for wheezing or shortness of breath.    Yes [provider]  alprazolam Duanne Moron) 2 MG tablet Take 2 mg by mouth  every 6 (six) hours as needed for sleep or anxiety.    Yes [provider]  Calcium-Magnesium-Zinc [phone] MG TABS Take 3 tablets by mouth daily.    Yes [provider]  ceFEPIme (MAXIPIME) 2 g injection Inject into the muscle 2 (two) times daily.  07/30/18  Yes [provider]  cyclobenzaprine (FLEXERIL) 10 MG tablet Take 10 mg by mouth daily as needed for muscle spasms.  07/11/16  Yes [provider]  diclofenac sodium (VOLTAREN) 1 % GEL Apply 2 g topically 4 (four) times daily. Apply to left forearm phlebitic area Patient taking differently: Apply 2 g topically 4 (four) times daily. Applied to affected areas for pain 01/08/18  Yes Derek Jack, MD  Flaxseed, Linseed, (FLAXSEED OIL PO) Take 1-2 capsules by mouth daily.   Yes [provider]  fluticasone (FLONASE) 50 MCG/ACT nasal spray Place 2 sprays into both nostrils daily as needed for allergies.  07/11/16  Yes [provider]  Ipilimumab (YERVOY IV) Inject into the vein every 21 ( twenty-one) days.    Yes [provider]  levETIRAcetam (KEPPRA) 500 MG tablet Take 500 mg by mouth every evening.  11/29/16  Yes [provider]  lidocaine (LIDODERM) 5 % Place 1 patch onto the skin daily. Remove & Discard patch within 12 hours or as directed by MD Patient taking differently: Place 1 patch onto the skin daily as needed (for pain). Remove & Discard patch within 12 hours or as directed by MD 07/23/18  Yes Bonnell Public, MD  lidocaine (XYLOCAINE) 2 % solution Use as directed 15 mLs in the mouth or throat every 4 (four) hours as needed for mouth pain. 07/17/18  Yes Derek Jack, MD  lidocaine-prilocaine (EMLA) cream APPLY TO PORTACATH SITE AS DIRECTED Patient taking differently: Apply 1 application topically once.  01/22/18  Yes Derek Jack, MD  lisinopril (PRINIVIL,ZESTRIL) 20 MG tablet Take 20 mg by mouth daily.    Yes [provider]    meclizine (ANTIVERT) 12.5 MG tablet Take 1 tablet (12.5 mg total) by mouth 3 (three) times daily as needed for dizziness. 10/28/16  Yes Florencia Reasons, MD  MULTIPLE VITAMINS PO Take 1 tablet by mouth daily.    Yes [provider]  naproxen (NAPROSYN) 500 MG tablet Take 500 mg by mouth at bedtime.    Yes [provider]  Nivolumab (OPDIVO IV) Inject into the vein every 21 ( twenty-one) days.    Yes [provider]  oxyCODONE (ROXICODONE) 15 MG immediate release tablet Take 15 mg by mouth 4 (four) times daily as needed for pain.    Yes [provider]  oxycodone (ROXICODONE) 30 MG immediate release tablet Take 30 mg by mouth every 4 (four) hours as needed for pain.   Yes [provider]  pantoprazole (PROTONIX) 40 MG tablet Take 40 mg by mouth at bedtime.  02/09/18  Yes [provider]  polyethylene glycol powder (GLYCOLAX/MIRALAX) powder Take 17 g by mouth daily as needed for moderate constipation.   Yes [provider]  prochlorperazine (COMPAZINE) 10 MG tablet Take 10 mg by mouth every 8 (eight) hours as needed for nausea or vomiting.    Yes [provider]  sucralfate (CARAFATE) 1 g tablet Take 1 tablet (1 g total) by mouth 4 (four) times daily. 07/12/18  Yes Kyung Rudd, MD  tamsulosin (FLOMAX) 0.4 MG CAPS capsule Take 0.4 mg by mouth every evening.  07/11/16  Yes [provider]  temazepam (RESTORIL) 30 MG capsule TAKE 1 CAPSULE AT BEDTIME AS NEEDED FOR SLEEP Patient taking differently: Take 30 mg by mouth at bedtime as needed for sleep.  07/11/18  Yes Lockamy, Randi L, NP-C  triamcinolone cream (KENALOG) 0.1 % Apply 1 application topically 2 (two) times daily.  04/23/18  Yes [provider]  TURMERIC PO Take 1 capsule by mouth daily.   Yes [provider]  Absorbable Collagen Hemostat (ACTIFOAM COLLAGEN SPONGE) MISC by Misc.(Non-Drug; Combo Route) route.    [provider]    Family  History Family History  Problem Relation Age of Onset  . Hypertension Father   . Heart attack Father   . Kidney failure Mother   . Diabetes Mother   . Hypertension Mother   . Colon cancer Neg Hx   . Anesthesia problems Neg Hx   . Hypotension Neg Hx   . Malignant hyperthermia Neg Hx   . Pseudochol deficiency Neg Hx     Social History Social History   Tobacco Use  . Smoking status: Former Smoker    Packs/day: 1.50    Years: 30.00    Pack years: 45.00    Types: Cigarettes    Last attempt to quit: 10/13/2017    Years since quitting: 0.8  . Smokeless tobacco: Never Used  Substance Use Topics  . Alcohol use: No  . Drug use: No     Allergies   Tecentriq [atezolizumab] and Xgeva [denosumab]   Review of Systems Review of Systems  Constitutional: Positive for fever. Negative for appetite change and fatigue.  HENT: Negative for congestion, ear discharge and sinus pressure.   Eyes: Negative for discharge.  Respiratory: Negative for cough.   Cardiovascular: Negative for chest pain.  Gastrointestinal: Negative for abdominal pain and diarrhea.  Genitourinary: Negative for frequency and hematuria.  Musculoskeletal: Negative for back pain.  Skin: Negative for rash.  Neurological: Negative for seizures and headaches.  Psychiatric/Behavioral: Negative for hallucinations.     Physical Exam Updated Vital Signs BP 132/78   Pulse 99   Temp 99.9 F (37.7 C) (Oral)   Resp (!) 22   Ht 5\' 7"  (1.702 m)   Wt 77.6 kg   SpO2 96%   BMI 26.78 kg/m   Physical Exam  Constitutional: He is oriented to person, place, and time. He appears well-developed.  HENT:  Head: Normocephalic.  Eyes: Conjunctivae and EOM are normal. No scleral icterus.  Neck: Neck supple. No thyromegaly present.  Cardiovascular: Normal rate and regular rhythm. Exam reveals no gallop and no friction rub.  No murmur heard. Pulmonary/Chest: No stridor. He has no wheezes. He has no rales. He exhibits no tenderness.   Abdominal: He exhibits no distension. There is no tenderness. There is no rebound.  Musculoskeletal: Normal range of motion. He exhibits no edema.  Lymphadenopathy:    He has no cervical adenopathy.  Neurological: He is oriented to person, place, and time. He  exhibits normal muscle tone. Coordination normal.  Skin: No rash noted. No erythema.  Psychiatric: He has a normal mood and affect. His behavior is normal.     ED Treatments / Results  Labs (all labs ordered are listed, but only abnormal results are displayed) Labs Reviewed  COMPREHENSIVE METABOLIC PANEL - Abnormal; Notable for the following components:      Result Value   Glucose, Bld 122 (*)    All other components within normal limits  CBC WITH DIFFERENTIAL/PLATELET - Abnormal; Notable for the following components:   RBC 3.71 (*)    Hemoglobin 10.8 (*)    HCT 34.9 (*)    Platelets 98 (*)    Lymphs Abs 0.2 (*)    All other components within normal limits  URINALYSIS, ROUTINE W REFLEX MICROSCOPIC - Abnormal; Notable for the following components:   Ketones, ur 5 (*)    Protein, ur 30 (*)    All other components within normal limits  CULTURE, BLOOD (ROUTINE X 2)  CULTURE, BLOOD (ROUTINE X 2)  INFLUENZA PANEL BY PCR (TYPE A & B)  I-STAT CG4 LACTIC ACID, ED  I-STAT CG4 LACTIC ACID, ED    EKG None  Radiology Dg Chest 2 View  Result Date: 08/05/2018 CLINICAL DATA:  Fever, small cell lung cancer post radiation therapy to brain metastases EXAM: CHEST - 2 VIEW COMPARISON:  07/03/2018 FINDINGS: RIGHT subclavian Port-A-Cath with tip projecting over SVC. Upper normal heart size. Mediastinal contours and pulmonary vascularity normal. Subsegmental atelectasis LEFT upper lobe. Lungs otherwise clear. No infiltrate, pleural effusion or pneumothorax. Scattered degenerative disc disease changes of the thoracic spine. Prior cervical spine fusion procedures. IMPRESSION: Subsegmental atelectasis LEFT upper lobe. Electronically Signed    By: Lavonia Dana M.D.   On: 08/05/2018 16:09    Procedures Procedures (including critical care time)  Medications Ordered in ED Medications  sodium chloride 0.9 % bolus 1,000 mL (0 mLs Intravenous Stopped 08/05/18 1842)  ondansetron (ZOFRAN) injection 4 mg (4 mg Intravenous Given 08/05/18 1604)  vancomycin (VANCOCIN) IVPB 1000 mg/200 mL premix ( Intravenous Stopped 08/05/18 1742)  piperacillin-tazobactam (ZOSYN) IVPB 3.375 g ( Intravenous Stopped 08/05/18 1636)  sodium chloride 0.9 % bolus 1,000 mL ( Intravenous Stopped 08/05/18 1814)  HYDROmorphone (DILAUDID) injection 1 mg (1 mg Intravenous Given 08/05/18 1756)  acetaminophen (TYLENOL) tablet 1,000 mg (1,000 mg Oral Given 08/05/18 1849)     Initial Impression / Assessment and Plan / ED Course  I have reviewed the triage vital signs and the nursing notes.  Pertinent labs & imaging results that were available during my care of the patient were reviewed by me and considered in my medical decision making (see chart for details). CRITICAL CARE Performed by: Milton Ferguson Total critical care time:75minutes Critical care time was exclusive of separately billable procedures and treating other patients. Critical care was necessary to treat or prevent imminent or life-threatening deterioration. Critical care was time spent personally by me on the following activities: development of treatment plan with patient and/or surrogate as well as nursing, discussions with consultants, evaluation of patient's response to treatment, examination of patient, obtaining history from patient or surrogate, ordering and performing treatments and interventions, ordering and review of laboratory studies, ordering and review of radiographic studies, pulse oximetry and re-evaluation of patient's condition.     Labs unremarkable.  Vital signs normalized with Tylenol and fluids.  Chest x-ray negative.  I spoke with the patient's oncologist who felt like the patient  could be sent home and  continue his present antibiotic treatment.  He will see his oncologist on Tuesday but if he feels worse tomorrow he will call his oncologist  Final Clinical Impressions(s) / ED Diagnoses   Final diagnoses:  Fever in other diseases    ED Discharge Orders    None       Milton Ferguson, MD 08/05/18 2019

## 2018-08-05 NOTE — ED Triage Notes (Signed)
Pt c/o fever of 102.7 today which started last night, pt c/o n/v so was unable to take anything for fever, pt reports was recently discharged, also c/o stiff neck, bilateral ear pain and back pain, pt reports last chemo treatment was Friday

## 2018-08-05 NOTE — Discharge Instructions (Addendum)
Follow-up with your oncologist on Tuesday.  Drink plenty of fluids take Tylenol for any fever.  If you feel worse tomorrow give to your oncologist to call and he may see you tomorrow

## 2018-08-05 NOTE — ED Notes (Addendum)
Pt given sprite per request. Pt informed that results are back and Dr should be in shortly to discuss results.

## 2018-08-06 ENCOUNTER — Other Ambulatory Visit (HOSPITAL_COMMUNITY): Payer: Self-pay | Admitting: Nurse Practitioner

## 2018-08-06 DIAGNOSIS — C3490 Malignant neoplasm of unspecified part of unspecified bronchus or lung: Secondary | ICD-10-CM

## 2018-08-06 DIAGNOSIS — G4700 Insomnia, unspecified: Secondary | ICD-10-CM

## 2018-08-08 ENCOUNTER — Inpatient Hospital Stay (HOSPITAL_BASED_OUTPATIENT_CLINIC_OR_DEPARTMENT_OTHER): Payer: Medicare HMO | Admitting: Hematology

## 2018-08-08 ENCOUNTER — Inpatient Hospital Stay (HOSPITAL_COMMUNITY): Payer: Medicare HMO

## 2018-08-08 ENCOUNTER — Encounter (HOSPITAL_COMMUNITY): Payer: Self-pay | Admitting: Hematology

## 2018-08-08 VITALS — BP 103/78 | HR 120 | Temp 97.4°F | Resp 20 | Wt 173.0 lb

## 2018-08-08 DIAGNOSIS — C7951 Secondary malignant neoplasm of bone: Secondary | ICD-10-CM | POA: Diagnosis not present

## 2018-08-08 DIAGNOSIS — Z87891 Personal history of nicotine dependence: Secondary | ICD-10-CM

## 2018-08-08 DIAGNOSIS — B370 Candidal stomatitis: Secondary | ICD-10-CM

## 2018-08-08 DIAGNOSIS — L309 Dermatitis, unspecified: Secondary | ICD-10-CM

## 2018-08-08 DIAGNOSIS — Z79899 Other long term (current) drug therapy: Secondary | ICD-10-CM | POA: Diagnosis not present

## 2018-08-08 DIAGNOSIS — R21 Rash and other nonspecific skin eruption: Secondary | ICD-10-CM

## 2018-08-08 DIAGNOSIS — Z9221 Personal history of antineoplastic chemotherapy: Secondary | ICD-10-CM

## 2018-08-08 DIAGNOSIS — C3492 Malignant neoplasm of unspecified part of left bronchus or lung: Secondary | ICD-10-CM | POA: Diagnosis not present

## 2018-08-08 DIAGNOSIS — Z5112 Encounter for antineoplastic immunotherapy: Secondary | ICD-10-CM | POA: Diagnosis not present

## 2018-08-08 DIAGNOSIS — C3490 Malignant neoplasm of unspecified part of unspecified bronchus or lung: Secondary | ICD-10-CM

## 2018-08-08 MED ORDER — SODIUM CHLORIDE 0.9% FLUSH
10.0000 mL | Freq: Once | INTRAVENOUS | Status: AC
  Administered 2018-08-08: 10 mL via INTRAVENOUS

## 2018-08-08 MED ORDER — SODIUM CHLORIDE 0.9 % IV SOLN
Freq: Once | INTRAVENOUS | Status: AC
  Administered 2018-08-08: 09:00:00 via INTRAVENOUS

## 2018-08-08 MED ORDER — HEPARIN SOD (PORK) LOCK FLUSH 100 UNIT/ML IV SOLN
INTRAVENOUS | Status: AC
  Filled 2018-08-08: qty 5

## 2018-08-08 MED ORDER — ONDANSETRON HCL 4 MG/2ML IJ SOLN
INTRAMUSCULAR | Status: AC
  Filled 2018-08-08: qty 2

## 2018-08-08 MED ORDER — ONDANSETRON HCL 4 MG/2ML IJ SOLN
INTRAMUSCULAR | Status: AC
  Filled 2018-08-08: qty 4

## 2018-08-08 MED ORDER — HEPARIN SOD (PORK) LOCK FLUSH 100 UNIT/ML IV SOLN: 500.0000 [IU] | Freq: Once | INTRAVENOUS | Status: DC

## 2018-08-08 MED ORDER — FLUCONAZOLE 100 MG PO TABS: 100.0000 mg | ORAL_TABLET | Freq: Every day | ORAL | 0 refills | Status: DC

## 2018-08-08 MED ORDER — ONDANSETRON HCL 4 MG/2ML IJ SOLN
8.0000 mg | Freq: Once | INTRAMUSCULAR | Status: AC
  Administered 2018-08-08: 8 mg via INTRAVENOUS

## 2018-08-08 NOTE — Patient Instructions (Signed)
[facility]  Discharge Instructions:   _______________________________________________________________  Thank you for choosing [facility] to provide your oncology and hematology care.  To afford each patient quality time with our providers, please arrive at least 15 minutes before your scheduled appointment.  You need to re-schedule your appointment if you arrive 10 or more minutes late.  We strive to give you quality time with our providers, and arriving late affects you and other patients whose appointments are after yours.  Also, if you no show three or more times for appointments you may be dismissed from the clinic.  Again, thank you for choosing [facility]. Our hope is that these requests will allow you access to exceptional care and in a timely manner. _______________________________________________________________  If you have questions after your visit, please contact our office at [phone] between the hours of 8:30 a.m. and 5:00 p.m. Voicemails left after 4:30 p.m. will not be returned until the following business day. _______________________________________________________________  For prescription refill requests, have your pharmacy contact our office. _______________________________________________________________  Recommendations made by the consultant and any test results will be sent to your referring physician. _______________________________________________________________ 

## 2018-08-08 NOTE — Assessment & Plan Note (Addendum)
1.  Extensive stage small cell lung cancer: -4 cycles of carboplatin, etoposide and Atezolizumab from 11/27/2017 through 01/29/2018 -CT scan of the chest, abdomen after 3 cycles of chemo immunotherapy on 01/25/2018 shows near complete resolution of the adenopathy and lung mass.  Sclerosis and bone mets noted.  - Maintenance Atezolizumab started on 02/28/2018, second dose on 03/22/2018, each time resulting in severe urticarial rash on the upper trunk and lower extremities, improved on steroid therapy. -He has gotten severe urticarial rash from Atezolizumab.  I have discussed the results of the CT scan of the chest, abdomen and pelvis dated 04/06/2018 which showed no evidence of disease progression.  Previously measured mediastinal lymph nodes are barely detectable.  Dominant left hilar lymph node measures 2.1 x 0.9 cm.  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  Mild T11 compression fracture with less than 10% height loss was also seen. - I have sent him for a second opinion with Dr. Elmyra Ricks at [facility]. -He was evaluated by Dr. Berta Minor on 04/19/2018.  He was also referred to [facility] dermatology Dr. Royanne Foots. -It was suggested to give another trial of Atezolizumab.  He discontinued dexamethasone 4 mg daily and started taking prednisone 10 mg daily.  He was also prescribed triamcinolone 0.1% cream to be used twice daily. - He developed breathing difficulty with shortness of breath on exertion and on and off chest pain for the last 3 weeks.  He reportedly went to the ER on 06/04/2018 with a did a CT scan of the chest PE protocol and CT of the abdomen and pelvis. - I have reviewed the images of the CT scan independently and discussed the results with the patient.  It showed progression of the left hilar adenopathy compared to the prior exam from 2 months ago.  This currently measures about 2.3 x 4 cm in size.  Subcarinal lymph node also increased in size compared to prior exam.   No new areas were seen. - Atezolizumab discontinued after last dose on 05/18/2018 secondary to progression. - He was evaluated by Dr. Elmyra Ricks at [facility].  Combination immunotherapy with ipilimumab and nivolumab was recommended, as responses with chemotherapy is very low as his cancer has progressed within 3 months of platinum induction regimen. - Cycle 1 of combination immunotherapy was given on 06/22/2018.  Patient has been having low-grade fevers at nighttime.  He was evaluated in the ER on 07/03/2018 and all the work-up was negative.  Blood cultures did not show any growth. - MRI of the brain on 06/28/2018 showed 5 new small round enhancing brain lesions compatible with metastasis.  He finished whole brain radiation therapy on 07/13/2018. - Cycle 2 of immunotherapy on 07/13/2018. -He was admitted to the hospital from 07/20/2018 through 07/25/2018 with malignant otitis externa.  He received vancomycin and Zosyn while inpatient and is currently receiving cefepime which will finish today. - Cycle 3 immunotherapy on 08/03/2018. - He went to the ER on 08/05/2018 with fever of 101.9.  He is having intermittent fevers. -I reviewed blood cultures from the ER which were negative.  Chest x-ray did not show any infection.  UA was normal. -I have checked his years today which did not show any evidence of infection. -He seems to get nausea after he gets antibiotic.  I have told him to not to take today's antibiotic dose.  His temperature is normal today. - He will receive IV fluids and Zofran in our office today. -I think  the fevers or a reaction to his combination immunotherapy.  He did have fevers in the past after receiving immunotherapy.  -We will send him Diflucan 100 mg daily for 5 days for oral thrush.  2. Bone metastasis: - He had 5 teeth pulled in the lower jaw on 07/31/2018.  We will hold his Zometa.  3.  Skin rash: - He no longer has skin rash since we started him on  combination immunotherapy.

## 2018-08-08 NOTE — Progress Notes (Signed)
[place], [place] [address]   CLINIC:  Medical Oncology/Hematology  PCP:  Redmond School, MD [address] 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     06/15/2018 -  Chemotherapy    The patient had ipilimumab (YERVOY) 90 mg in sodium chloride 0.9 % 50 mL chemo infusion, 1 mg/kg = 90 mg, Intravenous,  Once, 3 of 4 cycles Administration: 90 mg (06/22/2018), 90 mg (07/13/2018), 85 mg (08/03/2018) nivolumab (OPDIVO) 270 mg in sodium chloride 0.9 % 100 mL chemo infusion, 3 mg/kg = 270 mg, Intravenous, Once, 3 of 8 cycles Administration: 270 mg (06/22/2018), 240 mg (07/13/2018), 240 mg (08/03/2018)  for chemotherapy treatment.       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 Olden 57 y.o. male returns for routine follow-up for extensive stage small cell lung cancer. He is here today with his wife. He is still not feeling good. He stays nauseous since he has been on the antibiotics. He is unable to eat or drink fluids due to the nausea. He is  still having occasional fevers with chills. They are controlled with tylenol. His ear pain has improved. He still has a cough no hemoptysis. He denies any diarrhea. Denies any skin rashes or itching. He reports his appetite and energy level at 25% and he is maintaining his weight at this time.     REVIEW OF SYSTEMS:  Review of Systems  Respiratory: Positive for shortness of breath.   Musculoskeletal: Positive for back pain.  All other systems reviewed and are negative.    PAST MEDICAL/SURGICAL HISTORY:  Past Medical History:  Diagnosis Date  . Anxiety   . Asthma   . COPD (chronic obstructive pulmonary disease) ([place])   . DDD (degenerative disc disease)   . Diverticulitis   . GERD (gastroesophageal reflux disease)   . Headache   . Hepatitis    in the late 70's   . HTN (hypertension)   . Mediastinal adenopathy   . Seizures ([place])    as of 2019 none for 20 years  . Sleep apnea    does not  use cpap  . Stroke [facility])    TIA - Feb. 2018   Past Surgical History:  Procedure Laterality Date  . CERVICAL DISC SURGERY     X4  . CHOLECYSTECTOMY    . COLONOSCOPY  08/24/2011   Procedure: COLONOSCOPY;  Surgeon: Daneil Dolin, MD;  Location: [facility]O SUITE;  Service: Endoscopy;  Laterality: N/A;  7:30AM  . ELBOW SURGERY     right  . PORTACATH PLACEMENT Right 12/01/2017   Procedure:  INSERTION PORT-A-CATH;  Surgeon: Aviva Signs, MD;  Location: [facility] ORS;  Service: General;  Laterality: Right;  Marland Kitchen VIDEO BRONCHOSCOPY WITH ENDOBRONCHIAL ULTRASOUND N/A 10/31/2017   Procedure: VIDEO BRONCHOSCOPY WITH ENDOBRONCHIAL ULTRASOUND;  Surgeon: Ivin Poot, MD;  Location: [facility];  Service: Thoracic;  Laterality: N/A;  . VIDEO BRONCHOSCOPY WITH ENDOBRONCHIAL ULTRASOUND N/A 11/22/2017   Procedure: VIDEO BRONCHOSCOPY WITH ENDOBRONCHIAL ULTRASOUND;  Surgeon: Melrose Nakayama, MD;  Location: [facility];  Service: Thoracic;  Laterality: N/A;     SOCIAL HISTORY:  Social History   Socioeconomic History  .  Marital status: Married    Spouse name: Not on file  . Number of children: Not on file  . Years of education: Not on file  . Highest education level: Not on file  Occupational History  . Occupation: disability  Social Needs  . Financial resource strain: Not on file  . Food insecurity:    Worry: Not on file    Inability: Not on file  . Transportation needs:    Medical: No    Non-medical: No  Tobacco Use  . Smoking status: Former Smoker    Packs/day: 1.50    Years: 30.00    Pack years: 45.00    Types: Cigarettes    Last attempt to quit: 10/13/2017    Years since quitting: 0.8  . Smokeless tobacco: Never Used  Substance and Sexual Activity  . Alcohol use: No  . Drug use: No  . Sexual activity: Yes    Birth control/protection: None  Lifestyle  . Physical activity:    Days per week: Not on file    Minutes per session: Not on file  . Stress: Not on file  Relationships  . Social connections:    Talks on phone: Not on file    Gets together: Not on file    Attends religious service: Not on file    Active member of club or organization: Not on file    Attends meetings of clubs or organizations: Not on file    Relationship status: Not on file  . Intimate partner violence:    Fear of current or ex partner: Not on file    Emotionally abused: Not on file    Physically abused: Not on file    Forced sexual activity: Not on file  Other Topics Concern  . Not on file  Social History Narrative   06-20-18 Unable to ask abuse questions wife with him today.   07-25-18  Unable to ask abuse questions wife with him today.    FAMILY HISTORY:  Family History  Problem Relation Age of Onset  . Hypertension Father   . Heart attack Father   . Kidney failure Mother   . Diabetes Mother   . Hypertension Mother   . Colon cancer Neg Hx   . Anesthesia problems Neg Hx   . Hypotension Neg Hx   . Malignant hyperthermia Neg Hx   . Pseudochol deficiency Neg Hx     CURRENT MEDICATIONS:    Outpatient Encounter Medications as of 08/08/2018  Medication Sig Note  . Absorbable Collagen Hemostat (ACTIFOAM COLLAGEN SPONGE) MISC by Misc.(Non-Drug; Combo Route) route.   Marland Kitchen albuterol (PROVENTIL HFA;VENTOLIN HFA) 108 (90 BASE) MCG/ACT inhaler Inhale 2 puffs into the lungs every 6 (six) hours as needed for wheezing or shortness of breath.    Marland Kitchen albuterol (PROVENTIL) (2.5 MG/3ML) 0.083% nebulizer solution Take 2.5 mg by nebulization every 6 (six) hours as needed for wheezing or shortness of breath.    Marland Kitchen  alprazolam (XANAX) 2 MG tablet Take 2 mg by mouth every 6 (six) hours as needed for sleep or anxiety.  07/20/2018: Takes a 1/4 of a tablet to help sleep to prevent next day drowsiness.   . Calcium-Magnesium-Zinc 333-133-5 MG TABS Take 3 tablets by mouth daily.    Marland Kitchen ceFEPIme (MAXIPIME) 2 g injection Inject into the muscle 2 (two) times daily.    . cyclobenzaprine (FLEXERIL) 10 MG tablet Take 10 mg by mouth daily as needed for muscle spasms.    . diclofenac sodium (VOLTAREN) 1 % GEL Apply 2 g topically 4 (four) times daily. Apply to left forearm phlebitic area (Patient taking differently: Apply 2 g topically 4 (four) times daily. Applied to affected areas for pain)   . docusate sodium (COLACE) 100 MG capsule Take 100 mg by mouth 2 (two) times daily.   . Flaxseed, Linseed, (FLAXSEED OIL PO) Take 1-2 capsules by mouth daily.   . fluticasone (FLONASE) 50 MCG/ACT nasal spray Place 2 sprays into both nostrils daily as needed for allergies.    . Ipilimumab (YERVOY IV) Inject into the vein every 21 ( twenty-one) days.  07/20/2018: Immunotherapy every 3 weeks.   . levETIRAcetam (KEPPRA) 500 MG tablet Take 500 mg by mouth every evening.    . lidocaine (LIDODERM) 5 % Place 1 patch onto the skin daily. Remove & Discard patch within 12 hours or as directed by MD (Patient taking differently: Place 1 patch onto the skin daily as needed (for pain). Remove & Discard patch within 12 hours or as directed by MD)   .  lidocaine (XYLOCAINE) 2 % solution Use as directed 15 mLs in the mouth or throat every 4 (four) hours as needed for mouth pain.   Marland Kitchen lidocaine-prilocaine (EMLA) cream APPLY TO PORTACATH SITE AS DIRECTED (Patient taking differently: Apply 1 application topically once. )   . lisinopril (PRINIVIL,ZESTRIL) 20 MG tablet Take 20 mg by mouth daily.    . meclizine (ANTIVERT) 12.5 MG tablet Take 1 tablet (12.5 mg total) by mouth 3 (three) times daily as needed for dizziness.   . MULTIPLE VITAMINS PO Take 1 tablet by mouth daily.    . naproxen (NAPROSYN) 500 MG tablet Take 500 mg by mouth at bedtime.    . Nivolumab (OPDIVO IV) Inject into the vein every 21 ( twenty-one) days.    Marland Kitchen oxyCODONE (ROXICODONE) 15 MG immediate release tablet Take 15 mg by mouth 4 (four) times daily as needed for pain.    Marland Kitchen oxycodone (ROXICODONE) 30 MG immediate release tablet Take 30 mg by mouth every 4 (four) hours as needed for pain.   . pantoprazole (PROTONIX) 40 MG tablet Take 40 mg by mouth at bedtime.    . polyethylene glycol powder (GLYCOLAX/MIRALAX) powder Take 17 g by mouth daily as needed for moderate constipation.   . prochlorperazine (COMPAZINE) 10 MG tablet Take 10 mg by mouth every 8 (eight) hours as needed for nausea or vomiting.    . sucralfate (CARAFATE) 1 g tablet Take 1 tablet (1 g total) by mouth 4 (four) times daily. 07/20/2018: Takes two hours before and two hours after other medications.   . tamsulosin (FLOMAX) 0.4 MG CAPS capsule Take 0.4 mg by mouth every evening.    . temazepam (RESTORIL) 30 MG capsule Take 1 capsule (30 mg total) by mouth at bedtime as needed for sleep.   Marland Kitchen triamcinolone cream (KENALOG) 0.1 % Apply 1 application topically 2 (two) times daily.    . TURMERIC PO  Take 1 capsule by mouth daily.   . [DISCONTINUED] predniSONE (DELTASONE) 10 MG tablet TAKE 1 TABLET EVERY MORNING WITH BREAKFAST   . fluconazole (DIFLUCAN) 100 MG tablet Take 1 tablet (100 mg total) by mouth daily.     Facility-Administered Encounter Medications as of 08/08/2018  Medication  . 0.9 %  sodium chloride infusion  . morphine 2 MG/ML injection 2 mg    ALLERGIES:  Allergies  Allergen Reactions  . Tecentriq [Atezolizumab] Rash  . Xgeva [Denosumab] Rash     PHYSICAL EXAM:  ECOG Performance status: 1  Vitals:   08/08/18 0811  BP: 103/78  Pulse: (!) 120  Resp: 20  Temp: (!) 97.4 F (36.3 C)  SpO2: 99%   Filed Weights   08/08/18 0811  Weight: 173 lb (78.5 kg)    Physical Exam  Constitutional: He is oriented to person, place, and time. He appears well-developed and well-nourished.  Cardiovascular: Normal rate, regular rhythm and normal heart sounds.  Pulmonary/Chest: Effort normal and breath sounds normal.  Musculoskeletal: Normal range of motion.  Neurological: He is alert and oriented to person, place, and time.  Skin: Skin is warm and dry.  Psychiatric: He has a normal mood and affect. His behavior is normal. Judgment and thought content normal.  Bilateral ear exam was within normal limits. Tongue has thrush on it.   LABORATORY DATA:  I have reviewed the labs as listed.  CBC    Component Value Date/Time   WBC 4.4 08/05/2018 1537   RBC 3.71 (L) 08/05/2018 1537   HGB 10.8 (L) 08/05/2018 1537   HCT 34.9 (L) 08/05/2018 1537   PLT 98 (L) 08/05/2018 1537   MCV 94.1 08/05/2018 1537   MCH 29.1 08/05/2018 1537   MCHC 30.9 08/05/2018 1537   RDW 15.4 08/05/2018 1537   LYMPHSABS 0.2 (L) 08/05/2018 1537   MONOABS 0.3 08/05/2018 1537   EOSABS 0.1 08/05/2018 1537   BASOSABS 0.0 08/05/2018 1537   CMP Latest Ref Rng & Units 08/05/2018 07/30/2018 07/25/2018  Glucose 70 - 99 mg/dL 122(H) 97 117(H)  BUN 6 - 20 mg/dL 14 10 5(L)  Creatinine 0.61 - 1.24 mg/dL 1.02 1.01 1.32(H)  Sodium 135 - 145 mmol/L 137 140 142  Potassium 3.5 - 5.1 mmol/L 4.3 3.2(L) 3.6  Chloride 98 - 111 mmol/L 103 109 117(H)  CO2 22 - 32 mmol/L 25 22 22   Calcium 8.9 - 10.3 mg/dL 9.6 8.3(L) 7.5(L)   Total Protein 6.5 - 8.1 g/dL 7.5 6.7 -  Total Bilirubin 0.3 - 1.2 mg/dL 0.7 0.8 -  Alkaline Phos 38 - 126 U/L 53 51 -  AST 15 - 41 U/L 35 30 -  ALT 0 - 44 U/L 18 31 -       DIAGNOSTIC IMAGING:  I have independently reviewed the scans and discussed with the patient.   I have reviewed Francene Finders, NP's note and agree with the documentation.  I personally performed a face-to-face visit, made revisions and my assessment and plan is as follows.    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  Extensive stage small cell lung cancer: -4 cycles of carboplatin, etoposide and Atezolizumab from 11/27/2017 through 01/29/2018 -CT scan of the chest, abdomen after 3 cycles of chemo immunotherapy on 01/25/2018 shows near complete resolution of the adenopathy and lung mass.  Sclerosis and bone mets noted.  - Maintenance Atezolizumab started on 02/28/2018, second dose on 03/22/2018, each time resulting in severe urticarial  rash on the upper trunk and lower extremities, improved on steroid therapy. -He has gotten severe urticarial rash from Atezolizumab.  I have discussed the results of the CT scan of the chest, abdomen and pelvis dated 04/06/2018 which showed no evidence of disease progression.  Previously measured mediastinal lymph nodes are barely detectable.  Dominant left hilar lymph node measures 2.1 x 0.9 cm.  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  Mild T11 compression fracture with less than 10% height loss was also seen. - I have sent him for a second opinion with Dr. Elmyra Ricks at [facility]. -He was evaluated by Dr. Berta Minor on 04/19/2018.  He was also referred to [facility] dermatology Dr. Royanne Foots. -It was suggested to give another trial of Atezolizumab.  He discontinued dexamethasone 4 mg daily and started taking prednisone 10 mg daily.  He was also prescribed triamcinolone 0.1% cream to be used twice daily. - He developed  breathing difficulty with shortness of breath on exertion and on and off chest pain for the last 3 weeks.  He reportedly went to the ER on 06/04/2018 with a did a CT scan of the chest PE protocol and CT of the abdomen and pelvis. - I have reviewed the images of the CT scan independently and discussed the results with the patient.  It showed progression of the left hilar adenopathy compared to the prior exam from 2 months ago.  This currently measures about 2.3 x 4 cm in size.  Subcarinal lymph node also increased in size compared to prior exam.  No new areas were seen. - Atezolizumab discontinued after last dose on 05/18/2018 secondary to progression. - He was evaluated by Dr. Elmyra Ricks at [facility].  Combination immunotherapy with ipilimumab and nivolumab was recommended, as responses with chemotherapy is very low as his cancer has progressed within 3 months of platinum induction regimen. - Cycle 1 of combination immunotherapy was given on 06/22/2018.  Patient has been having low-grade fevers at nighttime.  He was evaluated in the ER on 07/03/2018 and all the work-up was negative.  Blood cultures did not show any growth. - MRI of the brain on 06/28/2018 showed 5 new small round enhancing brain lesions compatible with metastasis.  He finished whole brain radiation therapy on 07/13/2018. - Cycle 2 of immunotherapy on 07/13/2018. -He was admitted to the hospital from 07/20/2018 through 07/25/2018 with malignant otitis externa.  He received vancomycin and Zosyn while inpatient and is currently receiving cefepime which will finish today. - Cycle 3 immunotherapy on 08/03/2018. - He went to the ER on 08/05/2018 with fever of 101.9.  He is having intermittent fevers. -I reviewed blood cultures from the ER which were negative.  Chest x-ray did not show any infection.  UA was normal. -I have checked his years today which did not show any evidence of infection. -He seems to get nausea after he  gets antibiotic.  I have told him to not to take today's antibiotic dose.  His temperature is normal today. - He will receive IV fluids and Zofran in our office today. -I think the fevers or a reaction to his combination immunotherapy.  He did have fevers in the past after receiving immunotherapy.  -We will send him Diflucan 100 mg daily for 5 days for oral thrush.  2. Bone metastasis: - He had 5 teeth pulled in the lower jaw on 07/31/2018.  We will hold his Zometa.  3.  Skin rash: - He no  longer has skin rash since we started him on combination immunotherapy.       Orders placed this encounter:  No orders of the defined types were placed in this encounter.     Derek Jack, MD [place] [phone]

## 2018-08-08 NOTE — Patient Instructions (Signed)
[facility] at [facility] Discharge Instructions     Thank you for choosing [facility] at [facility] to provide your oncology and hematology care.  To afford each patient quality time with our provider, please arrive at least 15 minutes before your scheduled appointment time.   If you have a lab appointment with the Cancer Center please come in thru the  Main Entrance and check in at the main information desk  You need to re-schedule your appointment should you arrive 10 or more minutes late.  We strive to give you quality time with our providers, and arriving late affects you and other patients whose appointments are after yours.  Also, if you no show three or more times for appointments you may be dismissed from the clinic at the providers discretion.     Again, thank you for choosing [facility].  Our hope is that these requests will decrease the amount of time that you wait before being seen by our physicians.       _____________________________________________________________  Should you have questions after your visit to [facility], please contact our office at [phone] between the hours of 8:00 a.m. and 4:30 p.m.  Voicemails left after 4:00 p.m. will not be returned until the following business day.  For prescription refill requests, have your pharmacy contact our office and allow 72 hours.    Cancer Center Support Programs:   > Cancer Support Group  2nd Tuesday of the month 1pm-2pm, Journey Room    

## 2018-08-08 NOTE — Progress Notes (Signed)
Patient to treatment room for hydration and Zofran after oncology follow up visit.  Patient tolerated hydration with no complaints voiced.  Port site clean and dry with no bruising or swelling noted at site.  Good blood return noted with deaccess.  Band aid applied.  VSS with discharge and left ambulatory with no s/s of distress noted.

## 2018-08-10 LAB — CULTURE, BLOOD (ROUTINE X 2)
CULTURE: NO GROWTH
CULTURE: NO GROWTH
SPECIAL REQUESTS: ADEQUATE
Special Requests: ADEQUATE

## 2018-08-13 DIAGNOSIS — B957 Other staphylococcus as the cause of diseases classified elsewhere: Secondary | ICD-10-CM | POA: Diagnosis not present

## 2018-08-13 DIAGNOSIS — H6022 Malignant otitis externa, left ear: Secondary | ICD-10-CM | POA: Diagnosis not present

## 2018-08-13 DIAGNOSIS — I10 Essential (primary) hypertension: Secondary | ICD-10-CM | POA: Diagnosis not present

## 2018-08-13 DIAGNOSIS — C3490 Malignant neoplasm of unspecified part of unspecified bronchus or lung: Secondary | ICD-10-CM | POA: Diagnosis not present

## 2018-08-13 DIAGNOSIS — J449 Chronic obstructive pulmonary disease, unspecified: Secondary | ICD-10-CM | POA: Diagnosis not present

## 2018-08-15 ENCOUNTER — Encounter (HOSPITAL_COMMUNITY): Payer: Self-pay

## 2018-08-15 ENCOUNTER — Inpatient Hospital Stay (HOSPITAL_COMMUNITY): Payer: Medicare HMO | Attending: Hematology

## 2018-08-15 VITALS — BP 110/66 | HR 91 | Temp 98.1°F | Resp 16

## 2018-08-15 DIAGNOSIS — B370 Candidal stomatitis: Secondary | ICD-10-CM | POA: Diagnosis not present

## 2018-08-15 DIAGNOSIS — G893 Neoplasm related pain (acute) (chronic): Secondary | ICD-10-CM | POA: Insufficient documentation

## 2018-08-15 DIAGNOSIS — F5089 Other specified eating disorder: Secondary | ICD-10-CM | POA: Insufficient documentation

## 2018-08-15 DIAGNOSIS — Z9225 Personal history of immunosupression therapy: Secondary | ICD-10-CM | POA: Insufficient documentation

## 2018-08-15 DIAGNOSIS — R110 Nausea: Secondary | ICD-10-CM

## 2018-08-15 DIAGNOSIS — C787 Secondary malignant neoplasm of liver and intrahepatic bile duct: Secondary | ICD-10-CM | POA: Diagnosis not present

## 2018-08-15 DIAGNOSIS — R69 Illness, unspecified: Secondary | ICD-10-CM | POA: Diagnosis not present

## 2018-08-15 DIAGNOSIS — Z79899 Other long term (current) drug therapy: Secondary | ICD-10-CM | POA: Insufficient documentation

## 2018-08-15 DIAGNOSIS — Z5112 Encounter for antineoplastic immunotherapy: Secondary | ICD-10-CM | POA: Diagnosis present

## 2018-08-15 DIAGNOSIS — C3492 Malignant neoplasm of unspecified part of left bronchus or lung: Secondary | ICD-10-CM | POA: Insufficient documentation

## 2018-08-15 DIAGNOSIS — C7951 Secondary malignant neoplasm of bone: Secondary | ICD-10-CM | POA: Insufficient documentation

## 2018-08-15 DIAGNOSIS — Z87891 Personal history of nicotine dependence: Secondary | ICD-10-CM | POA: Insufficient documentation

## 2018-08-15 MED ORDER — SODIUM CHLORIDE 0.9% FLUSH
10.0000 mL | INTRAVENOUS | Status: DC | PRN
  Administered 2018-08-15: 10 mL via INTRAVENOUS
  Filled 2018-08-15: qty 10

## 2018-08-15 MED ORDER — SODIUM CHLORIDE 0.9 % IV SOLN
8.0000 mg | Freq: Once | INTRAVENOUS | Status: DC
  Filled 2018-08-15: qty 4

## 2018-08-15 MED ORDER — HEPARIN SOD (PORK) LOCK FLUSH 100 UNIT/ML IV SOLN: 500.0000 [IU] | Freq: Once | INTRAVENOUS | Status: DC

## 2018-08-15 MED ORDER — HYDROMORPHONE HCL 1 MG/ML IJ SOLN
1.0000 mg | Freq: Once | INTRAMUSCULAR | Status: AC
  Administered 2018-08-15: 1 mg via INTRAVENOUS
  Filled 2018-08-15: qty 1

## 2018-08-15 MED ORDER — SODIUM CHLORIDE 0.9 % IV SOLN
INTRAVENOUS | Status: AC
  Administered 2018-08-15: 15:00:00 via INTRAVENOUS

## 2018-08-15 MED ORDER — ONDANSETRON HCL 4 MG/2ML IJ SOLN
8.0000 mg | Freq: Once | INTRAMUSCULAR | Status: AC
  Administered 2018-08-15: 8 mg via INTRAVENOUS
  Filled 2018-08-15: qty 4

## 2018-08-15 NOTE — Progress Notes (Signed)
1400 Pt's wife called today reporting that the pt has had nausea and vomiting all day today as well as back and abd pain, low grade temp, no appetite and fatigue.This information was reviewed with Dr. Delton Coombes and pt to come in for hydration and nausea and pain medications per MD orders                                                             1545 Pt sleeping at this time after pain medication given IV at 1512.                          [name] [name] tolerated hydration and nausea and pain medication well without further complaints at this time VSS upon discharge. Pt to return tomorrow for the same fluids and medications. Pt discharged via wheelchair in stable condition accompanied by his wife

## 2018-08-15 NOTE — Patient Instructions (Signed)
[place] at [facility] Discharge Instructions  Received hydration and nausea and pain medication. Follow-up as scheduled. Call clinic for any questions or concerns   Thank you for choosing [place] at [facility] to provide your oncology and hematology care.  To afford each patient quality time with our provider, please arrive at least 15 minutes before your scheduled appointment time.   If you have a lab appointment with the [facility] please come in thru the  Main Entrance and check in at the main information desk  You need to re-schedule your appointment should you arrive 10 or more minutes late.  We strive to give you quality time with our providers, and arriving late affects you and other patients whose appointments are after yours.  Also, if you no show three or more times for appointments you may be dismissed from the clinic at the providers discretion.     Again, thank you for choosing [facility].  Our hope is that these requests will decrease the amount of time that you wait before being seen by our physicians.       _____________________________________________________________  Should you have questions after your visit to [facility], please contact our office at [phone] between the hours of 8:00 a.m. and 4:30 p.m.  Voicemails left after 4:00 p.m. will not be returned until the following business day.  For prescription refill requests, have your pharmacy contact our office and allow 72 hours.    Cancer Center Support Programs:   > Cancer Support Group  2nd Tuesday of the month 1pm-2pm, Journey Room

## 2018-08-16 ENCOUNTER — Encounter (HOSPITAL_COMMUNITY): Payer: Self-pay

## 2018-08-16 ENCOUNTER — Inpatient Hospital Stay (HOSPITAL_COMMUNITY): Payer: Medicare HMO

## 2018-08-16 VITALS — BP 136/73 | HR 97 | Temp 98.0°F | Resp 20

## 2018-08-16 DIAGNOSIS — Z5112 Encounter for antineoplastic immunotherapy: Secondary | ICD-10-CM | POA: Diagnosis not present

## 2018-08-16 DIAGNOSIS — C7951 Secondary malignant neoplasm of bone: Secondary | ICD-10-CM

## 2018-08-16 DIAGNOSIS — Z87891 Personal history of nicotine dependence: Secondary | ICD-10-CM | POA: Diagnosis not present

## 2018-08-16 DIAGNOSIS — C3492 Malignant neoplasm of unspecified part of left bronchus or lung: Secondary | ICD-10-CM | POA: Diagnosis not present

## 2018-08-16 DIAGNOSIS — R110 Nausea: Secondary | ICD-10-CM

## 2018-08-16 DIAGNOSIS — Z79899 Other long term (current) drug therapy: Secondary | ICD-10-CM | POA: Diagnosis not present

## 2018-08-16 DIAGNOSIS — H9193 Unspecified hearing loss, bilateral: Secondary | ICD-10-CM | POA: Diagnosis not present

## 2018-08-16 DIAGNOSIS — H906 Mixed conductive and sensorineural hearing loss, bilateral: Secondary | ICD-10-CM | POA: Diagnosis not present

## 2018-08-16 DIAGNOSIS — C787 Secondary malignant neoplasm of liver and intrahepatic bile duct: Secondary | ICD-10-CM | POA: Diagnosis not present

## 2018-08-16 DIAGNOSIS — Z9225 Personal history of immunosupression therapy: Secondary | ICD-10-CM | POA: Diagnosis not present

## 2018-08-16 DIAGNOSIS — R69 Illness, unspecified: Secondary | ICD-10-CM | POA: Diagnosis not present

## 2018-08-16 DIAGNOSIS — G893 Neoplasm related pain (acute) (chronic): Secondary | ICD-10-CM | POA: Diagnosis not present

## 2018-08-16 DIAGNOSIS — C3490 Malignant neoplasm of unspecified part of unspecified bronchus or lung: Secondary | ICD-10-CM

## 2018-08-16 DIAGNOSIS — B370 Candidal stomatitis: Secondary | ICD-10-CM | POA: Diagnosis not present

## 2018-08-16 MED ORDER — ONDANSETRON HCL 4 MG/2ML IJ SOLN
8.0000 mg | Freq: Once | INTRAMUSCULAR | Status: AC
  Administered 2018-08-16: 8 mg via INTRAVENOUS
  Filled 2018-08-16: qty 4

## 2018-08-16 MED ORDER — SODIUM CHLORIDE 0.9 % IV SOLN: 8.0000 mg | Freq: Once | INTRAVENOUS | Status: DC

## 2018-08-16 MED ORDER — SODIUM CHLORIDE 0.9 % IV SOLN
INTRAVENOUS | Status: DC
  Administered 2018-08-16: 14:00:00 via INTRAVENOUS

## 2018-08-16 MED ORDER — HYDROMORPHONE HCL 1 MG/ML IJ SOLN
1.0000 mg | Freq: Once | INTRAMUSCULAR | Status: AC
  Administered 2018-08-16: 1 mg via INTRAVENOUS

## 2018-08-16 MED ORDER — HYDROMORPHONE HCL 1 MG/ML IJ SOLN
INTRAMUSCULAR | Status: AC
  Filled 2018-08-16: qty 1

## 2018-08-16 NOTE — Patient Instructions (Signed)
[place] at [facility] Discharge Instructions  Received hydration and nausea and pain medication today. Follow-up as scheduled. Call clinic for any questions or concerns   Thank you for choosing [place] at [facility] to provide your oncology and hematology care.  To afford each patient quality time with our provider, please arrive at least 15 minutes before your scheduled appointment time.   If you have a lab appointment with the [facility] [place] please come in thru the  Main Entrance and check in at the main information desk  You need to re-schedule your appointment should you arrive 10 or more minutes late.  We strive to give you quality time with our providers, and arriving late affects you and other patients whose appointments are after yours.  Also, if you no show three or more times for appointments you may be dismissed from the clinic at the providers discretion.     Again, thank you for choosing [facility].  Our hope is that these requests will decrease the amount of time that you wait before being seen by our physicians.       _____________________________________________________________  Should you have questions after your visit to [facility], please contact our office at [phone] between the hours of 8:00 a.m. and 4:30 p.m.  Voicemails left after 4:00 p.m. will not be returned until the following business day.  For prescription refill requests, have your pharmacy contact our office and allow 72 hours.    Cancer Center Support Programs:   > Cancer Support Group  2nd Tuesday of the month 1pm-2pm, Journey Room

## 2018-08-16 NOTE — Progress Notes (Signed)
[name] [name] tolerated hydration and nausea and pain medication well without complaints or incident. Port left accessed for use tomorrow. Pt discharged via wheelchair in satisfactory condition accompanied by his wife

## 2018-08-17 ENCOUNTER — Inpatient Hospital Stay (HOSPITAL_COMMUNITY): Payer: Medicare HMO

## 2018-08-17 ENCOUNTER — Encounter (HOSPITAL_COMMUNITY): Payer: Self-pay

## 2018-08-17 VITALS — BP 131/72 | HR 87 | Temp 97.9°F | Resp 18

## 2018-08-17 DIAGNOSIS — R69 Illness, unspecified: Secondary | ICD-10-CM | POA: Diagnosis not present

## 2018-08-17 DIAGNOSIS — B370 Candidal stomatitis: Secondary | ICD-10-CM | POA: Diagnosis not present

## 2018-08-17 DIAGNOSIS — Z9225 Personal history of immunosupression therapy: Secondary | ICD-10-CM | POA: Diagnosis not present

## 2018-08-17 DIAGNOSIS — C3490 Malignant neoplasm of unspecified part of unspecified bronchus or lung: Secondary | ICD-10-CM

## 2018-08-17 DIAGNOSIS — C7951 Secondary malignant neoplasm of bone: Secondary | ICD-10-CM

## 2018-08-17 DIAGNOSIS — Z5112 Encounter for antineoplastic immunotherapy: Secondary | ICD-10-CM | POA: Diagnosis not present

## 2018-08-17 DIAGNOSIS — Z79899 Other long term (current) drug therapy: Secondary | ICD-10-CM | POA: Diagnosis not present

## 2018-08-17 DIAGNOSIS — G4733 Obstructive sleep apnea (adult) (pediatric): Secondary | ICD-10-CM | POA: Diagnosis not present

## 2018-08-17 DIAGNOSIS — C3492 Malignant neoplasm of unspecified part of left bronchus or lung: Secondary | ICD-10-CM | POA: Diagnosis not present

## 2018-08-17 DIAGNOSIS — G893 Neoplasm related pain (acute) (chronic): Secondary | ICD-10-CM | POA: Diagnosis not present

## 2018-08-17 DIAGNOSIS — R110 Nausea: Secondary | ICD-10-CM

## 2018-08-17 DIAGNOSIS — C787 Secondary malignant neoplasm of liver and intrahepatic bile duct: Secondary | ICD-10-CM | POA: Diagnosis not present

## 2018-08-17 DIAGNOSIS — Z87891 Personal history of nicotine dependence: Secondary | ICD-10-CM | POA: Diagnosis not present

## 2018-08-17 MED ORDER — SODIUM CHLORIDE 0.9 % IV SOLN
INTRAVENOUS | Status: DC
  Administered 2018-08-17: 11:00:00 via INTRAVENOUS

## 2018-08-17 MED ORDER — HEPARIN SOD (PORK) LOCK FLUSH 100 UNIT/ML IV SOLN
INTRAVENOUS | Status: AC
  Filled 2018-08-17: qty 5

## 2018-08-17 MED ORDER — HYDROMORPHONE HCL 1 MG/ML IJ SOLN
1.0000 mg | Freq: Once | INTRAMUSCULAR | Status: AC
  Administered 2018-08-17: 1 mg via INTRAVENOUS
  Filled 2018-08-17: qty 1

## 2018-08-17 MED ORDER — ONDANSETRON HCL 4 MG/2ML IJ SOLN
8.0000 mg | Freq: Once | INTRAMUSCULAR | Status: AC
  Administered 2018-08-17: 8 mg via INTRAVENOUS
  Filled 2018-08-17: qty 4

## 2018-08-17 MED ORDER — SODIUM CHLORIDE 0.9 % IV SOLN: INTRAVENOUS | Status: DC

## 2018-08-17 MED ORDER — SODIUM CHLORIDE 0.9% FLUSH
10.0000 mL | Freq: Once | INTRAVENOUS | Status: AC
  Administered 2018-08-17: 10 mL via INTRAVENOUS

## 2018-08-17 MED ORDER — HEPARIN SOD (PORK) LOCK FLUSH 100 UNIT/ML IV SOLN
500.0000 [IU] | Freq: Once | INTRAVENOUS | Status: AC
  Administered 2018-08-17: 500 [IU] via INTRAVENOUS
  Filled 2018-08-17: qty 5

## 2018-08-17 NOTE — Progress Notes (Signed)
Patient tolerated hydration.  Port site clean and dry with no bruising or swelling noted at site.  VSs with discharge. Patient verbalized decrease in pain.  See MAR.  Discharged by wheelchair with family and no s/s of distress noted.

## 2018-08-17 NOTE — Patient Instructions (Signed)
[facility] at [facility]  Discharge Instructions:   _______________________________________________________________  Thank you for choosing [facility] at [facility] to provide your oncology and hematology care.  To afford each patient quality time with our providers, please arrive at least 15 minutes before your scheduled appointment.  You need to re-schedule your appointment if you arrive 10 or more minutes late.  We strive to give you quality time with our providers, and arriving late affects you and other patients whose appointments are after yours.  Also, if you no show three or more times for appointments you may be dismissed from the clinic.  Again, thank you for choosing [facility] Our hope is that these requests will allow you access to exceptional care and in a timely manner. _______________________________________________________________  If you have questions after your visit, please contact our office at [phone] between the hours of 8:30 a.m. and 5:00 p.m. Voicemails left after 4:30 p.m. will not be returned until the following business day. _______________________________________________________________  For prescription refill requests, have your pharmacy contact our office. _______________________________________________________________  Recommendations made by the consultant and any test results will be sent to your referring physician. _______________________________________________________________ 

## 2018-08-20 ENCOUNTER — Inpatient Hospital Stay (HOSPITAL_COMMUNITY): Payer: Medicare HMO

## 2018-08-20 DIAGNOSIS — C3492 Malignant neoplasm of unspecified part of left bronchus or lung: Secondary | ICD-10-CM | POA: Diagnosis not present

## 2018-08-20 DIAGNOSIS — B370 Candidal stomatitis: Secondary | ICD-10-CM | POA: Diagnosis not present

## 2018-08-20 DIAGNOSIS — G893 Neoplasm related pain (acute) (chronic): Secondary | ICD-10-CM | POA: Diagnosis not present

## 2018-08-20 DIAGNOSIS — C7951 Secondary malignant neoplasm of bone: Secondary | ICD-10-CM | POA: Diagnosis not present

## 2018-08-20 DIAGNOSIS — C787 Secondary malignant neoplasm of liver and intrahepatic bile duct: Secondary | ICD-10-CM | POA: Diagnosis not present

## 2018-08-20 DIAGNOSIS — Z79899 Other long term (current) drug therapy: Secondary | ICD-10-CM | POA: Diagnosis not present

## 2018-08-20 DIAGNOSIS — Z5112 Encounter for antineoplastic immunotherapy: Secondary | ICD-10-CM | POA: Diagnosis not present

## 2018-08-20 DIAGNOSIS — Z87891 Personal history of nicotine dependence: Secondary | ICD-10-CM | POA: Diagnosis not present

## 2018-08-20 DIAGNOSIS — R69 Illness, unspecified: Secondary | ICD-10-CM | POA: Diagnosis not present

## 2018-08-20 DIAGNOSIS — Z9225 Personal history of immunosupression therapy: Secondary | ICD-10-CM | POA: Diagnosis not present

## 2018-08-20 MED ORDER — SODIUM CHLORIDE 0.9 % IV SOLN
INTRAVENOUS | Status: DC
  Administered 2018-08-20: 14:00:00 via INTRAVENOUS

## 2018-08-20 MED ORDER — PROMETHAZINE HCL 25 MG/ML IJ SOLN
25.0000 mg | Freq: Once | INTRAMUSCULAR | Status: AC
  Administered 2018-08-20: 12.5 mg via INTRAVENOUS
  Filled 2018-08-20: qty 1

## 2018-08-20 MED ORDER — HYDROMORPHONE HCL 1 MG/ML IJ SOLN
1.0000 mg | Freq: Once | INTRAMUSCULAR | Status: AC
  Administered 2018-08-20: 1 mg via INTRAVENOUS
  Filled 2018-08-20: qty 1

## 2018-08-20 MED ORDER — SODIUM CHLORIDE 0.9 % IV SOLN
INTRAVENOUS | Status: AC
  Administered 2018-08-20: 15:00:00 via INTRAVENOUS

## 2018-08-20 MED ORDER — ONDANSETRON HCL 4 MG/2ML IJ SOLN
8.0000 mg | Freq: Once | INTRAMUSCULAR | Status: AC
  Administered 2018-08-20: 8 mg via INTRAVENOUS

## 2018-08-20 MED ORDER — HYDROMORPHONE HCL 1 MG/ML IJ SOLN
1.0000 mg | Freq: Once | INTRAMUSCULAR | Status: AC
  Administered 2018-08-20: 1 mg via INTRAVENOUS

## 2018-08-20 NOTE — Patient Instructions (Signed)
[facility] at [facility] _______________________________________________________________  Thank you for choosing [facility] at [facility] to provide your oncology and hematology care.  To afford each patient quality time with our providers, please arrive at least 15 minutes before your scheduled appointment.  You need to re-schedule your appointment if you arrive 10 or more minutes late.  We strive to give you quality time with our providers, and arriving late affects you and other patients whose appointments are after yours.  Also, if you no show three or more times for appointments you may be dismissed from the clinic.  Again, thank you for choosing [facility] at [facility]. Our hope is that these requests will allow you access to exceptional care and in a timely manner. _______________________________________________________________  If you have questions after your visit, please contact our office at [phone] between the hours of 8:30 a.m. and 5:00 p.m. Voicemails left after 4:30 p.m. will not be returned until the following business day. _______________________________________________________________  For prescription refill requests, have your pharmacy contact our office. _______________________________________________________________  Recommendations made by the consultant and any test results will be sent to your referring physician. _______________________________________________________________ 

## 2018-08-20 NOTE — Progress Notes (Signed)
1330 Pt presented to the clinic with severe back pain and N+V. Orders obtained for hydration and nausea and pain medication from Dr. Delton Coombes. Pt to continue Colace and Miralax tonight and am for constipation per MD                                                                         Kallie Edward Guise tolerated hydration and nausea and pain medications well without complaints or incident. Pt denied any pain or nausea upon discharge. VSS upon dsicharge. Pt discharged via wheelchair in stable condition accompanied by his wife

## 2018-08-21 ENCOUNTER — Inpatient Hospital Stay (HOSPITAL_COMMUNITY): Payer: Medicare HMO

## 2018-08-21 ENCOUNTER — Ambulatory Visit (HOSPITAL_COMMUNITY)
Admission: RE | Admit: 2018-08-21 | Discharge: 2018-08-21 | Disposition: A | Discharge status: Home / Self Care | Payer: Medicare HMO | Source: Ambulatory Visit | Attending: Nurse Practitioner | Admitting: Nurse Practitioner

## 2018-08-21 VITALS — BP 127/69 | HR 101 | Temp 98.4°F | Resp 16

## 2018-08-21 DIAGNOSIS — C3492 Malignant neoplasm of unspecified part of left bronchus or lung: Secondary | ICD-10-CM | POA: Diagnosis not present

## 2018-08-21 DIAGNOSIS — C3490 Malignant neoplasm of unspecified part of unspecified bronchus or lung: Secondary | ICD-10-CM

## 2018-08-21 DIAGNOSIS — Z79899 Other long term (current) drug therapy: Secondary | ICD-10-CM | POA: Diagnosis not present

## 2018-08-21 DIAGNOSIS — Z87891 Personal history of nicotine dependence: Secondary | ICD-10-CM | POA: Diagnosis not present

## 2018-08-21 DIAGNOSIS — B370 Candidal stomatitis: Secondary | ICD-10-CM | POA: Diagnosis not present

## 2018-08-21 DIAGNOSIS — Z5111 Encounter for antineoplastic chemotherapy: Secondary | ICD-10-CM | POA: Diagnosis not present

## 2018-08-21 DIAGNOSIS — R69 Illness, unspecified: Secondary | ICD-10-CM | POA: Diagnosis not present

## 2018-08-21 DIAGNOSIS — Z5112 Encounter for antineoplastic immunotherapy: Secondary | ICD-10-CM | POA: Diagnosis not present

## 2018-08-21 DIAGNOSIS — C7951 Secondary malignant neoplasm of bone: Secondary | ICD-10-CM | POA: Diagnosis not present

## 2018-08-21 DIAGNOSIS — G893 Neoplasm related pain (acute) (chronic): Secondary | ICD-10-CM | POA: Diagnosis not present

## 2018-08-21 DIAGNOSIS — C787 Secondary malignant neoplasm of liver and intrahepatic bile duct: Secondary | ICD-10-CM | POA: Diagnosis not present

## 2018-08-21 DIAGNOSIS — Z9225 Personal history of immunosupression therapy: Secondary | ICD-10-CM | POA: Diagnosis not present

## 2018-08-21 MED ORDER — ONDANSETRON HCL 4 MG/2ML IJ SOLN
INTRAMUSCULAR | Status: AC
  Filled 2018-08-21: qty 4

## 2018-08-21 MED ORDER — IOPAMIDOL (ISOVUE-300) INJECTION 61%
100.0000 mL | Freq: Once | INTRAVENOUS | Status: AC | PRN
  Administered 2018-08-21: 100 mL via INTRAVENOUS

## 2018-08-21 MED ORDER — HYDROMORPHONE HCL 1 MG/ML IJ SOLN
INTRAMUSCULAR | Status: AC
  Filled 2018-08-21: qty 1

## 2018-08-21 MED ORDER — SODIUM CHLORIDE 0.9 % IV SOLN
Freq: Once | INTRAVENOUS | Status: AC
  Administered 2018-08-21: 14:00:00 via INTRAVENOUS

## 2018-08-21 MED ORDER — ONDANSETRON HCL 4 MG/2ML IJ SOLN
8.0000 mg | Freq: Once | INTRAMUSCULAR | Status: AC
  Administered 2018-08-21: 8 mg via INTRAVENOUS

## 2018-08-21 MED ORDER — HYDROMORPHONE HCL 1 MG/ML IJ SOLN
1.0000 mg | Freq: Once | INTRAMUSCULAR | Status: AC
  Administered 2018-08-21: 1 mg via INTRAVENOUS

## 2018-08-21 MED ORDER — HYDROMORPHONE HCL 1 MG/ML IJ SOLN
1.0000 mg | Freq: Once | INTRAMUSCULAR | Status: AC
  Administered 2018-08-21: 1 mg via INTRAVENOUS
  Filled 2018-08-21: qty 1

## 2018-08-21 MED ORDER — PROMETHAZINE HCL 25 MG/ML IJ SOLN
12.5000 mg | Freq: Once | INTRAMUSCULAR | Status: AC
  Administered 2018-08-21: 12.5 mg via INTRAVENOUS
  Filled 2018-08-21: qty 1

## 2018-08-21 NOTE — Patient Instructions (Signed)
[place] at [facility] Discharge Instructions  Sent for stat ct scans   Thank you for choosing [place] at [facility] to provide your oncology and hematology care.  To afford each patient quality time with our provider, please arrive at least 15 minutes before your scheduled appointment time.   If you have a lab appointment with the [facility] please come in thru the  Main Entrance and check in at the main information desk  You need to re-schedule your appointment should you arrive 10 or more minutes late.  We strive to give you quality time with our providers, and arriving late affects you and other patients whose appointments are after yours.  Also, if you no show three or more times for appointments you may be dismissed from the clinic at the providers discretion.     Again, thank you for choosing [facility].  Our hope is that these requests will decrease the amount of time that you wait before being seen by our physicians.       _____________________________________________________________  Should you have questions after your visit to [facility], please contact our office at [phone] between the hours of 8:00 a.m. and 4:30 p.m.  Voicemails left after 4:00 p.m. will not be returned until the following business day.  For prescription refill requests, have your pharmacy contact our office and allow 72 hours.    Cancer Center Support Programs:   > Cancer Support Group  2nd Tuesday of the month 1pm-2pm, Journey Room

## 2018-08-21 NOTE — Progress Notes (Signed)
Per MD, will get scans done stat today, originally scheduled for Friday. Appointment made tomorrow for MD to discuss scan results and will given hydration fluids and meds per MD.    Vitals stable and discharged home from clinic ambulatory. Follow up as scheduled.   Discharged from clinic today via wheelchair with wife at side. Going to Radiology for scans. Follow up in the morning as scheduled.

## 2018-08-21 NOTE — Progress Notes (Signed)
Pt presents today for IV fluids, Zofran, Dilaudid, and Phenergan. VSS. Pt vomiting and dry heaving, diaphoretic, rating pain a 10. Pt distraught and asking nurse to please help him. Dr. Delton Coombes informed of patient's status and requesting MD to assess and speak with patient.

## 2018-08-22 ENCOUNTER — Inpatient Hospital Stay (HOSPITAL_BASED_OUTPATIENT_CLINIC_OR_DEPARTMENT_OTHER): Payer: Medicare HMO | Admitting: Hematology

## 2018-08-22 ENCOUNTER — Encounter (HOSPITAL_COMMUNITY): Payer: Self-pay | Admitting: Hematology

## 2018-08-22 ENCOUNTER — Other Ambulatory Visit (HOSPITAL_COMMUNITY): Payer: Self-pay | Admitting: *Deleted

## 2018-08-22 ENCOUNTER — Inpatient Hospital Stay (HOSPITAL_COMMUNITY): Payer: Medicare HMO

## 2018-08-22 DIAGNOSIS — Z9225 Personal history of immunosupression therapy: Secondary | ICD-10-CM | POA: Diagnosis not present

## 2018-08-22 DIAGNOSIS — G893 Neoplasm related pain (acute) (chronic): Secondary | ICD-10-CM | POA: Diagnosis not present

## 2018-08-22 DIAGNOSIS — R110 Nausea: Secondary | ICD-10-CM

## 2018-08-22 DIAGNOSIS — C3490 Malignant neoplasm of unspecified part of unspecified bronchus or lung: Secondary | ICD-10-CM

## 2018-08-22 DIAGNOSIS — C787 Secondary malignant neoplasm of liver and intrahepatic bile duct: Secondary | ICD-10-CM | POA: Diagnosis not present

## 2018-08-22 DIAGNOSIS — C7951 Secondary malignant neoplasm of bone: Secondary | ICD-10-CM

## 2018-08-22 DIAGNOSIS — R69 Illness, unspecified: Secondary | ICD-10-CM | POA: Diagnosis not present

## 2018-08-22 DIAGNOSIS — Z79899 Other long term (current) drug therapy: Secondary | ICD-10-CM | POA: Diagnosis not present

## 2018-08-22 DIAGNOSIS — Z87891 Personal history of nicotine dependence: Secondary | ICD-10-CM | POA: Diagnosis not present

## 2018-08-22 DIAGNOSIS — Z5112 Encounter for antineoplastic immunotherapy: Secondary | ICD-10-CM | POA: Diagnosis not present

## 2018-08-22 DIAGNOSIS — B370 Candidal stomatitis: Secondary | ICD-10-CM | POA: Diagnosis not present

## 2018-08-22 DIAGNOSIS — C3492 Malignant neoplasm of unspecified part of left bronchus or lung: Secondary | ICD-10-CM

## 2018-08-22 MED ORDER — HYDROMORPHONE HCL 1 MG/ML IJ SOLN
1.0000 mg | Freq: Once | INTRAMUSCULAR | Status: AC
  Administered 2018-08-22: 1 mg via INTRAVENOUS

## 2018-08-22 MED ORDER — SCOPOLAMINE 1 MG/3DAYS TD PT72
1.0000 | MEDICATED_PATCH | TRANSDERMAL | Status: DC
  Administered 2018-08-22: 1.5 mg via TRANSDERMAL
  Filled 2018-08-22: qty 1

## 2018-08-22 MED ORDER — HYDROMORPHONE HCL 1 MG/ML IJ SOLN
INTRAMUSCULAR | Status: AC
  Filled 2018-08-22: qty 1

## 2018-08-22 MED ORDER — SODIUM CHLORIDE 0.9 % IV SOLN
INTRAVENOUS | Status: DC
  Administered 2018-08-22: 10:00:00 via INTRAVENOUS

## 2018-08-22 MED ORDER — ONDANSETRON HCL 4 MG/2ML IJ SOLN
8.0000 mg | Freq: Once | INTRAMUSCULAR | Status: AC
  Administered 2018-08-22: 8 mg via INTRAVENOUS

## 2018-08-22 MED ORDER — PROMETHAZINE HCL 25 MG/ML IJ SOLN
25.0000 mg | Freq: Once | INTRAMUSCULAR | Status: AC
  Administered 2018-08-22: 25 mg via INTRAVENOUS

## 2018-08-22 MED ORDER — ONDANSETRON HCL 4 MG/2ML IJ SOLN
INTRAMUSCULAR | Status: AC
  Filled 2018-08-22: qty 4

## 2018-08-22 MED ORDER — PROMETHAZINE HCL 25 MG/ML IJ SOLN
INTRAMUSCULAR | Status: AC
  Filled 2018-08-22: qty 1

## 2018-08-22 MED ORDER — PROMETHAZINE HCL 25 MG PO TABS: 25.0000 mg | ORAL_TABLET | Freq: Four times a day (QID) | ORAL | 0 refills | Status: DC | PRN

## 2018-08-22 NOTE — Progress Notes (Signed)
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     06/15/2018 -  Chemotherapy    The patient had ipilimumab (YERVOY) 90 mg in sodium chloride 0.9 % 50 mL chemo infusion, 1 mg/kg = 90 mg, Intravenous,  Once, 3 of 4 cycles Administration: 90 mg (06/22/2018), 90 mg (07/13/2018), 85 mg (08/03/2018) nivolumab (OPDIVO) 270 mg in sodium chloride 0.9 % 100 mL chemo infusion, 3 mg/kg = 270 mg, Intravenous, Once, 3 of 8 cycles Administration: 270 mg (06/22/2018), 240 mg (07/13/2018), 240 mg (08/03/2018)  for chemotherapy treatment.       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 Bobier 57 y.o. male returns for routine follow-up for extensive stage small cell lung cancer. He is here today with his wife. He is having left sided flank and back pain. It comes and goes and is sharp. He is unable to eat or drink anything, due to being nauseous. He denies  any diarrhea. Denies any new cough or hemoptysis. Denies any bleeding or easy bruising. Denies any headaches or vision changes. He reports his energy and appetite is zero at this time.     REVIEW OF SYSTEMS:  Review of Systems  Constitutional: Positive for fatigue.  Gastrointestinal: Positive for nausea.  Musculoskeletal: Positive for back pain.     PAST MEDICAL/SURGICAL HISTORY:  Past Medical History:  Diagnosis Date  . Anxiety   . Asthma   . COPD (chronic obstructive pulmonary disease) ([place])   . DDD (degenerative disc disease)   . Diverticulitis   . GERD (gastroesophageal reflux disease)   . Headache   . Hepatitis    in the late 70's   . HTN (hypertension)   . Mediastinal adenopathy   . Seizures ([place])    as of 2019 none for 20 years  . Sleep apnea    does not  use cpap  . Stroke [facility])    TIA - Feb. 2018   Past Surgical History:  Procedure Laterality Date  . CERVICAL DISC SURGERY     X4  . CHOLECYSTECTOMY    . COLONOSCOPY  08/24/2011   Procedure: COLONOSCOPY;  Surgeon: Daneil Dolin, MD;  Location: [facility];  Service: Endoscopy;  Laterality: N/A;  7:30AM  . ELBOW SURGERY     right  . PORTACATH PLACEMENT Right 12/01/2017   Procedure: INSERTION PORT-A-CATH;  Surgeon: Aviva Signs, MD;  Location: [facility] ORS;  Service: General;  Laterality: Right;  Marland Kitchen VIDEO BRONCHOSCOPY WITH ENDOBRONCHIAL ULTRASOUND N/A  10/31/2017   Procedure: VIDEO BRONCHOSCOPY WITH ENDOBRONCHIAL ULTRASOUND;  Surgeon: Ivin Poot, MD;  Location: [facility];  Service: Thoracic;  Laterality: N/A;  . VIDEO BRONCHOSCOPY WITH ENDOBRONCHIAL ULTRASOUND N/A 11/22/2017   Procedure: VIDEO BRONCHOSCOPY WITH ENDOBRONCHIAL ULTRASOUND;  Surgeon: Melrose Nakayama, MD;  Location: [facility];  Service: Thoracic;  Laterality: N/A;     SOCIAL HISTORY:  Social History   Socioeconomic History  . Marital status: Married    Spouse name: Not on file  . Number of children: Not on file  . Years of education: Not on file    . Highest education level: Not on file  Occupational History  . Occupation: disability  Social Needs  . Financial resource strain: Not on file  . Food insecurity:    Worry: Not on file    Inability: Not on file  . Transportation needs:    Medical: No    Non-medical: No  Tobacco Use  . Smoking status: Former Smoker    Packs/day: 1.50    Years: 30.00    Pack years: 45.00    Types: Cigarettes    Last attempt to quit: 10/13/2017    Years since quitting: 0.8  . Smokeless tobacco: Never Used  Substance and Sexual Activity  . Alcohol use: No  . Drug use: No  . Sexual activity: Yes    Birth control/protection: None  Lifestyle  . Physical activity:    Days per week: Not on file    Minutes per session: Not on file  . Stress: Not on file  Relationships  . Social connections:    Talks on phone: Not on file    Gets together: Not on file    Attends religious service: Not on file    Active member of club or organization: Not on file    Attends meetings of clubs or organizations: Not on file    Relationship status: Not on file  . Intimate partner violence:    Fear of current or ex partner: Not on file    Emotionally abused: Not on file    Physically abused: Not on file    Forced sexual activity: Not on file  Other Topics Concern  . Not on file  Social History Narrative   06-20-18 Unable to ask abuse questions wife with him today.   07-25-18  Unable to ask abuse questions wife with him today.    FAMILY HISTORY:  Family History  Problem Relation Age of Onset  . Hypertension Father   . Heart attack Father   . Kidney failure Mother   . Diabetes Mother   . Hypertension Mother   . Colon cancer Neg Hx   . Anesthesia problems Neg Hx   . Hypotension Neg Hx   . Malignant hyperthermia Neg Hx   . Pseudochol deficiency Neg Hx     CURRENT MEDICATIONS:  Outpatient Encounter Medications as of 08/22/2018  Medication Sig Note  . Absorbable Collagen Hemostat (ACTIFOAM COLLAGEN  SPONGE) MISC by Misc.(Non-Drug; Combo Route) route.   Marland Kitchen albuterol (PROVENTIL HFA;VENTOLIN HFA) 108 (90 BASE) MCG/ACT inhaler Inhale 2 puffs into the lungs every 6 (six) hours as needed for wheezing or shortness of breath.    Marland Kitchen albuterol (PROVENTIL) (2.5 MG/3ML) 0.083% nebulizer solution Take 2.5 mg by nebulization every 6 (six) hours as needed for wheezing or shortness of breath.    . alprazolam (XANAX) 2 MG tablet Take 2 mg by mouth every 6 (six) hours as needed for sleep or anxiety.  07/20/2018: Takes a 1/4 of a tablet to help sleep to prevent next day drowsiness.   . Calcium-Magnesium-Zinc 333-133-5 MG TABS Take 3 tablets by mouth daily.    . cyclobenzaprine (FLEXERIL) 10 MG tablet Take 10 mg by mouth daily as needed for muscle spasms.    . diclofenac sodium (VOLTAREN) 1 % GEL Apply 2 g topically 4 (four) times daily. Apply to left forearm phlebitic area (Patient not taking: Reported on 08/22/2018)   . docusate sodium (COLACE) 100 MG capsule Take 100 mg by mouth 2 (two) times daily.   . Flaxseed, Linseed, (FLAXSEED OIL PO) Take 1-2 capsules by mouth daily.   . fluconazole (DIFLUCAN) 100 MG tablet Take 1 tablet (100 mg total) by mouth daily. (Patient not taking: Reported on 08/22/2018)   . fluticasone (FLONASE) 50 MCG/ACT nasal spray Place 2 sprays into both nostrils daily as needed for allergies.    Marland Kitchen levETIRAcetam (KEPPRA) 500 MG tablet Take 500 mg by mouth every evening.    . lidocaine (LIDODERM) 5 % Place 1 patch onto the skin daily. Remove & Discard patch within 12 hours or as directed by MD (Patient not taking: Reported on 08/22/2018)   . lidocaine (XYLOCAINE) 2 % solution Use as directed 15 mLs in the mouth or throat every 4 (four) hours as needed for mouth pain. (Patient not taking: Reported on 08/22/2018)   . lidocaine-prilocaine (EMLA) cream APPLY TO PORTACATH SITE AS DIRECTED (Patient taking differently: Apply 1 application topically once. )   . lisinopril (PRINIVIL,ZESTRIL) 20 MG tablet  Take 20 mg by mouth daily.    . meclizine (ANTIVERT) 12.5 MG tablet Take 1 tablet (12.5 mg total) by mouth 3 (three) times daily as needed for dizziness.   . MULTIPLE VITAMINS PO Take 1 tablet by mouth daily.    . naproxen (NAPROSYN) 500 MG tablet Take 500 mg by mouth at bedtime.    Marland Kitchen oxyCODONE (ROXICODONE) 15 MG immediate release tablet Take 15 mg by mouth 4 (four) times daily as needed for pain.    Marland Kitchen oxycodone (ROXICODONE) 30 MG immediate release tablet Take 30 mg by mouth every 4 (four) hours as needed for pain.   . pantoprazole (PROTONIX) 40 MG tablet Take 40 mg by mouth at bedtime.    . polyethylene glycol powder (GLYCOLAX/MIRALAX) powder Take 17 g by mouth daily as needed for moderate constipation.   . promethazine (PHENERGAN) 25 MG tablet Take 1 tablet (25 mg total) by mouth every 6 (six) hours as needed for nausea or vomiting.   . sucralfate (CARAFATE) 1 g tablet Take 1 tablet (1 g total) by mouth 4 (four) times daily. (Patient not taking: Reported on 08/22/2018) 07/20/2018: Takes two hours before and two hours after other medications.   . tamsulosin (FLOMAX) 0.4 MG CAPS capsule Take 0.4 mg by mouth every evening.    . temazepam (RESTORIL) 30 MG capsule Take 1 capsule (30 mg total) by mouth at bedtime as needed for sleep. (Patient not taking: Reported on 08/22/2018)   . triamcinolone cream (KENALOG) 0.1 % Apply 1 application topically 2 (two) times daily.    . TURMERIC PO Take 1 capsule by mouth daily.   . [DISCONTINUED] ceFEPIme (MAXIPIME) 2 g injection Inject into the muscle 2 (two) times daily.    . [DISCONTINUED] Ipilimumab (YERVOY IV) Inject into the vein every 21 ( twenty-one) days.  07/20/2018: Immunotherapy every 3 weeks.   . [DISCONTINUED] Nivolumab (OPDIVO IV) Inject into the vein every 21 ( twenty-one) days.    . [  DISCONTINUED] prochlorperazine (COMPAZINE) 10 MG tablet Take 10 mg by mouth every 8 (eight) hours as needed for nausea or vomiting.     Facility-Administered Encounter  Medications as of 08/22/2018  Medication  . 0.9 %  sodium chloride infusion  . 0.9 %  sodium chloride infusion  . [COMPLETED] HYDROmorphone (DILAUDID) injection 1 mg  . morphine 2 MG/ML injection 2 mg  . [COMPLETED] ondansetron (ZOFRAN) injection 8 mg    ALLERGIES:  Allergies  Allergen Reactions  . Tecentriq [Atezolizumab] Rash  . Xgeva [Denosumab] Rash     PHYSICAL EXAM:  ECOG Performance status: 1  VITAL SIGNS:BP: 150/81, P:118, R:16, T:100.0, O2:97%  Physical Exam  Constitutional: He is oriented to person, place, and time. He appears well-developed and well-nourished.  Cardiovascular: Normal rate, regular rhythm and normal heart sounds.  Pulmonary/Chest: Effort normal and breath sounds normal.  Musculoskeletal: Normal range of motion.  Neurological: He is alert and oriented to person, place, and time.  Skin: Skin is warm and dry.  Psychiatric: He has a normal mood and affect. His behavior is normal. Judgment and thought content normal.     LABORATORY DATA:  I have reviewed the labs as listed.  CBC    Component Value Date/Time   WBC 4.4 08/05/2018 1537   RBC 3.71 (L) 08/05/2018 1537   HGB 10.8 (L) 08/05/2018 1537   HCT 34.9 (L) 08/05/2018 1537   PLT 98 (L) 08/05/2018 1537   MCV 94.1 08/05/2018 1537   MCH 29.1 08/05/2018 1537   MCHC 30.9 08/05/2018 1537   RDW 15.4 08/05/2018 1537   LYMPHSABS 0.2 (L) 08/05/2018 1537   MONOABS 0.3 08/05/2018 1537   EOSABS 0.1 08/05/2018 1537   BASOSABS 0.0 08/05/2018 1537   CMP Latest Ref Rng & Units 08/05/2018 07/30/2018 07/25/2018  Glucose 70 - 99 mg/dL 122(H) 97 117(H)  BUN 6 - 20 mg/dL 14 10 5(L)  Creatinine 0.61 - 1.24 mg/dL 1.02 1.01 1.32(H)  Sodium 135 - 145 mmol/L 137 140 142  Potassium 3.5 - 5.1 mmol/L 4.3 3.2(L) 3.6  Chloride 98 - 111 mmol/L 103 109 117(H)  CO2 22 - 32 mmol/L 25 22 22   Calcium 8.9 - 10.3 mg/dL 9.6 8.3(L) 7.5(L)  Total Protein 6.5 - 8.1 g/dL 7.5 6.7 -  Total Bilirubin 0.3 - 1.2 mg/dL 0.7 0.8 -    Alkaline Phos 38 - 126 U/L 53 51 -  AST 15 - 41 U/L 35 30 -  ALT 0 - 44 U/L 18 31 -       DIAGNOSTIC IMAGING:  I have independently reviewed the scans and discussed with the patient.   I have reviewed Francene Finders, NP's note and agree with the documentation.  I personally performed a face-to-face visit, made revisions and my assessment and plan is as follows.    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  Extensive stage small cell lung cancer: -4 cycles of carboplatin, etoposide and Atezolizumab from 11/27/2017 through 01/29/2018 -CT scan of the chest, abdomen after 3 cycles of chemo immunotherapy on 01/25/2018 shows near complete resolution of the adenopathy and lung mass.  Sclerosis and bone mets noted.  - Maintenance Atezolizumab started on 02/28/2018, second dose on 03/22/2018, each time resulting in severe urticarial rash on the upper trunk and lower extremities, improved on steroid therapy. -He has gotten severe urticarial rash from Atezolizumab.  I have discussed the results of the CT scan of the chest, abdomen and pelvis dated  04/06/2018 which showed no evidence of disease progression.  Previously measured mediastinal lymph nodes are barely detectable.  Dominant left hilar lymph node measures 2.1 x 0.9 cm.  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  Mild T11 compression fracture with less than 10% height loss was also seen. - I have sent him for a second opinion with Dr. Elmyra Ricks at [facility]. -He was evaluated by Dr. Berta Minor on 04/19/2018.  He was also referred to [facility] dermatology Dr. Royanne Foots. -It was suggested to give another trial of Atezolizumab.  He discontinued dexamethasone 4 mg daily and started taking prednisone 10 mg daily.  He was also prescribed triamcinolone 0.1% cream to be used twice daily. - He developed breathing difficulty with shortness of breath on exertion and on and off chest pain  for the last 3 weeks.  He reportedly went to the ER on 06/04/2018 with a did a CT scan of the chest PE protocol and CT of the abdomen and pelvis. - I have reviewed the images of the CT scan independently and discussed the results with the patient.  It showed progression of the left hilar adenopathy compared to the prior exam from 2 months ago.  This currently measures about 2.3 x 4 cm in size.  Subcarinal lymph node also increased in size compared to prior exam.  No new areas were seen. - Atezolizumab discontinued after last dose on 05/18/2018 secondary to progression. - He was evaluated by Dr. Elmyra Ricks at [facility].  Combination immunotherapy with ipilimumab and nivolumab was recommended, as responses with chemotherapy is very low as his cancer has progressed within 3 months of platinum induction regimen. - Cycle 1 of combination immunotherapy was given on 06/22/2018.  Patient has been having low-grade fevers at nighttime.  He was evaluated in the ER on 07/03/2018 and all the work-up was negative.  Blood cultures did not show any growth. - MRI of the brain on 06/28/2018 showed 5 new small round enhancing brain lesions compatible with metastasis.  He finished whole brain radiation therapy on 07/13/2018. - Cycle 2 of immunotherapy on 07/13/2018. -He was admitted to the hospital from 07/20/2018 through 07/25/2018 with malignant otitis externa.  He received vancomycin and Zosyn while inpatient and is currently receiving cefepime which will finish today. - Cycle 3 immunotherapy on 08/03/2018. - Today we reviewed the results of the CT CAP dated 08/21/2018 which shows interval development of numerous hypervascular hepatic lesions consistent with metastatic disease.  Largest lesion measures 2 cm.  Widespread bone metastatic disease is stable. - He has been feeling very weak and has been receiving IV fluids for the past few days along with Zofran, Phenergan and Dilaudid. -he does have back  pain which radiates laterally, predominantly on the left side.  He is currently taking oxycodone 15 mg alternating with 30 mg. - Given his poor performance status, I do not believe he is a candidate for any active chemotherapy.  We will discontinue combination immunotherapy.  We also talked about best supportive care in the form of pain control and keeping him comfortable.  However patient wants to wait a week or 2 to see if he gets better and can tolerate chemotherapy. - He has poor oral intake.  He will continue to require hydration with 1 L of normal saline given over 1 to 2 hours every day.  We will see if home health will be able to give fluids at his house. -He has persistent nausea.  Compazine does not help.  We will start him on Phenergan 25 mg every 4-6 hours as needed.  I will also put a scopolamine patch and see if it helps. - We also talked about starting him on dexamethasone 2 mg in the mornings.  We would like to hold off and see if scopolamine and Phenergan work.  I would also hold off on starting him on Marinol at this time for appetite stimulation. -I will see him back in 7 to 10 days for follow-up.  If there is no improvement, we will consider hospice.  2. Bone metastasis: - He had 5 teeth pulled in the lower jaw on 07/31/2018.  His Zometa is on hold.    Total time spent is 40 minutes with more than 50% of the time spent face-to-face discussing scan results, options, prognosis and coordination of care.  Orders placed this encounter:  Orders Placed This Encounter  Procedures  . TSH  . Lactate dehydrogenase  . CBC with Differential/Platelet  . Comprehensive metabolic panel      Derek Jack, MD [place] [phone]

## 2018-08-22 NOTE — Assessment & Plan Note (Signed)
1.  Extensive stage small cell lung cancer: -4 cycles of carboplatin, etoposide and Atezolizumab from 11/27/2017 through 01/29/2018 -CT scan of the chest, abdomen after 3 cycles of chemo immunotherapy on 01/25/2018 shows near complete resolution of the adenopathy and lung mass.  Sclerosis and bone mets noted.  - Maintenance Atezolizumab started on 02/28/2018, second dose on 03/22/2018, each time resulting in severe urticarial rash on the upper trunk and lower extremities, improved on steroid therapy. -He has gotten severe urticarial rash from Atezolizumab.  I have discussed the results of the CT scan of the chest, abdomen and pelvis dated 04/06/2018 which showed no evidence of disease progression.  Previously measured mediastinal lymph nodes are barely detectable.  Dominant left hilar lymph node measures 2.1 x 0.9 cm.  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  Mild T11 compression fracture with less than 10% height loss was also seen. - I have sent him for a second opinion with Dr. Elmyra Ricks at [facility]. -He was evaluated by Dr. Berta Minor on 04/19/2018.  He was also referred to [facility] dermatology Dr. Royanne Foots. -It was suggested to give another trial of Atezolizumab.  He discontinued dexamethasone 4 mg daily and started taking prednisone 10 mg daily.  He was also prescribed triamcinolone 0.1% cream to be used twice daily. - He developed breathing difficulty with shortness of breath on exertion and on and off chest pain for the last 3 weeks.  He reportedly went to the ER on 06/04/2018 with a did a CT scan of the chest PE protocol and CT of the abdomen and pelvis. - I have reviewed the images of the CT scan independently and discussed the results with the patient.  It showed progression of the left hilar adenopathy compared to the prior exam from 2 months ago.  This currently measures about 2.3 x 4 cm in size.  Subcarinal lymph node also increased in size compared to prior exam.   No new areas were seen. - Atezolizumab discontinued after last dose on 05/18/2018 secondary to progression. - He was evaluated by Dr. Elmyra Ricks at [facility].  Combination immunotherapy with ipilimumab and nivolumab was recommended, as responses with chemotherapy is very low as his cancer has progressed within 3 months of platinum induction regimen. - Cycle 1 of combination immunotherapy was given on 06/22/2018.  Patient has been having low-grade fevers at nighttime.  He was evaluated in the ER on 07/03/2018 and all the work-up was negative.  Blood cultures did not show any growth. - MRI of the brain on 06/28/2018 showed 5 new small round enhancing brain lesions compatible with metastasis.  He finished whole brain radiation therapy on 07/13/2018. - Cycle 2 of immunotherapy on 07/13/2018. -He was admitted to the hospital from 07/20/2018 through 07/25/2018 with malignant otitis externa.  He received vancomycin and Zosyn while inpatient and is currently receiving cefepime which will finish today. - Cycle 3 immunotherapy on 08/03/2018. - Today we reviewed the results of the CT CAP dated 08/21/2018 which shows interval development of numerous hypervascular hepatic lesions consistent with metastatic disease.  Largest lesion measures 2 cm.  Widespread bone metastatic disease is stable. - He has been feeling very weak and has been receiving IV fluids for the past few days along with Zofran, Phenergan and Dilaudid. -he does have back pain which radiates laterally, predominantly on the left side.  He is currently taking oxycodone 15 mg alternating with 30 mg. - Given his poor performance status, I do  not believe he is a candidate for any active chemotherapy.  We will discontinue combination immunotherapy.  We also talked about best supportive care in the form of pain control and keeping him comfortable.  However patient wants to wait a week or 2 to see if he gets better and can tolerate  chemotherapy. - He has poor oral intake.  He will continue to require hydration with 1 L of normal saline given over 1 to 2 hours every day.  We will see if home health will be able to give fluids at his house. -He has persistent nausea.  Compazine does not help.  We will start him on Phenergan 25 mg every 4-6 hours as needed.  I will also put a scopolamine patch and see if it helps. - We also talked about starting him on dexamethasone 2 mg in the mornings.  We would like to hold off and see if scopolamine and Phenergan work.  I would also hold off on starting him on Marinol at this time for appetite stimulation. -I will see him back in 7 to 10 days for follow-up.  If there is no improvement, we will consider hospice.  2. Bone metastasis: - He had 5 teeth pulled in the lower jaw on 07/31/2018.  His Zometa is on hold.

## 2018-08-22 NOTE — Patient Instructions (Signed)
[place] at [facility] Discharge Instructions Scopolamine patch can stay on till Saturday around 2pm, its good for 72hours. Phenergan 25 mg tablet sent to pharmacy for nausea. Can take every 6 hours.  Continue taking your current pain medication to see if that it working.  A nurse will call tomorrow and check on how things are going.  Follow up as scheduled.  Thank you for choosing [place] at [facility] to provide your oncology and hematology care.  To afford each patient quality time with our provider, please arrive at least 15 minutes before your scheduled appointment time.   If you have a lab appointment with the [facility] [place] please come in thru the  Main Entrance and check in at the main information desk  You need to re-schedule your appointment should you arrive 10 or more minutes late.  We strive to give you quality time with our providers, and arriving late affects you and other patients whose appointments are after yours.  Also, if you no show three or more times for appointments you may be dismissed from the clinic at the providers discretion.     Again, thank you for choosing [facility].  Our hope is that these requests will decrease the amount of time that you wait before being seen by our physicians.       _____________________________________________________________  Should you have questions after your visit to [facility], please contact our office at [phone] between the hours of 8:00 a.m. and 4:30 p.m.  Voicemails left after 4:00 p.m. will not be returned until the following business day.  For prescription refill requests, have your pharmacy contact our office and allow 72 hours.    Cancer Center Support Programs:   > Cancer Support Group  2nd Tuesday of the month 1pm-2pm, Journey Room

## 2018-08-22 NOTE — Progress Notes (Signed)
Patient presented today for follow up for scans, will give hydration and medications for nausea per orders. Patient states he feels the same as yesterday, also running fever off and on throughout the night.   Patient set up for home health care for hydration fluids. Romualdo Bolk RN notified and spoke with wife at bedside.   Medications given today per orders. Patient tolerated it well without problems. Vitals stable and discharged home from clinic via wheelchair with wife.  Follow up as scheduled.

## 2018-08-22 NOTE — Patient Instructions (Signed)
[facility] at [facility] Discharge Instructions     Thank you for choosing [facility] at [facility] to provide your oncology and hematology care.  To afford each patient quality time with our provider, please arrive at least 15 minutes before your scheduled appointment time.   If you have a lab appointment with the Cancer Center please come in thru the  Main Entrance and check in at the main information desk  You need to re-schedule your appointment should you arrive 10 or more minutes late.  We strive to give you quality time with our providers, and arriving late affects you and other patients whose appointments are after yours.  Also, if you no show three or more times for appointments you may be dismissed from the clinic at the providers discretion.     Again, thank you for choosing [facility].  Our hope is that these requests will decrease the amount of time that you wait before being seen by our physicians.       _____________________________________________________________  Should you have questions after your visit to [facility], please contact our office at [phone] between the hours of 8:00 a.m. and 4:30 p.m.  Voicemails left after 4:00 p.m. will not be returned until the following business day.  For prescription refill requests, have your pharmacy contact our office and allow 72 hours.    Cancer Center Support Programs:   > Cancer Support Group  2nd Tuesday of the month 1pm-2pm, Journey Room    

## 2018-08-23 ENCOUNTER — Telehealth (HOSPITAL_COMMUNITY): Payer: Self-pay

## 2018-08-23 ENCOUNTER — Other Ambulatory Visit (HOSPITAL_COMMUNITY): Payer: Self-pay

## 2018-08-23 ENCOUNTER — Ambulatory Visit (HOSPITAL_COMMUNITY): Payer: Medicare HMO

## 2018-08-23 DIAGNOSIS — C7951 Secondary malignant neoplasm of bone: Secondary | ICD-10-CM | POA: Diagnosis not present

## 2018-08-23 DIAGNOSIS — C3490 Malignant neoplasm of unspecified part of unspecified bronchus or lung: Secondary | ICD-10-CM | POA: Diagnosis not present

## 2018-08-23 DIAGNOSIS — Z452 Encounter for adjustment and management of vascular access device: Secondary | ICD-10-CM | POA: Diagnosis not present

## 2018-08-23 DIAGNOSIS — E860 Dehydration: Secondary | ICD-10-CM | POA: Diagnosis not present

## 2018-08-23 DIAGNOSIS — R112 Nausea with vomiting, unspecified: Secondary | ICD-10-CM | POA: Diagnosis not present

## 2018-08-23 DIAGNOSIS — Z791 Long term (current) use of non-steroidal anti-inflammatories (NSAID): Secondary | ICD-10-CM | POA: Diagnosis not present

## 2018-08-23 MED ORDER — SCOPOLAMINE 1 MG/3DAYS TD PT72: 1.0000 | MEDICATED_PATCH | TRANSDERMAL | 12 refills | Status: AC

## 2018-08-23 NOTE — Telephone Encounter (Signed)
Called and spoke with wife today. She stated that patient slept all night without any nausea/vomiting, no nausea this morning when he woke up. He has eaten lunch today. He is resting at this time and is not having any issues today thus far. Nurse will call tomorrow and check on pt. Will also call in refill on spapolamine

## 2018-08-24 ENCOUNTER — Ambulatory Visit (HOSPITAL_COMMUNITY): Payer: Medicare HMO

## 2018-08-24 ENCOUNTER — Other Ambulatory Visit (HOSPITAL_COMMUNITY): Payer: Medicare HMO

## 2018-08-24 ENCOUNTER — Encounter (HOSPITAL_COMMUNITY): Payer: Self-pay

## 2018-08-24 ENCOUNTER — Inpatient Hospital Stay (HOSPITAL_COMMUNITY): Payer: Medicare HMO

## 2018-08-24 VITALS — BP 135/69 | HR 79 | Temp 97.9°F | Resp 18

## 2018-08-24 DIAGNOSIS — Z79899 Other long term (current) drug therapy: Secondary | ICD-10-CM | POA: Diagnosis not present

## 2018-08-24 DIAGNOSIS — C3490 Malignant neoplasm of unspecified part of unspecified bronchus or lung: Secondary | ICD-10-CM

## 2018-08-24 DIAGNOSIS — Z9225 Personal history of immunosupression therapy: Secondary | ICD-10-CM | POA: Diagnosis not present

## 2018-08-24 DIAGNOSIS — M47812 Spondylosis without myelopathy or radiculopathy, cervical region: Secondary | ICD-10-CM | POA: Diagnosis not present

## 2018-08-24 DIAGNOSIS — Z5112 Encounter for antineoplastic immunotherapy: Secondary | ICD-10-CM | POA: Diagnosis not present

## 2018-08-24 DIAGNOSIS — R69 Illness, unspecified: Secondary | ICD-10-CM | POA: Diagnosis not present

## 2018-08-24 DIAGNOSIS — Z87891 Personal history of nicotine dependence: Secondary | ICD-10-CM | POA: Diagnosis not present

## 2018-08-24 DIAGNOSIS — C787 Secondary malignant neoplasm of liver and intrahepatic bile duct: Secondary | ICD-10-CM | POA: Diagnosis not present

## 2018-08-24 DIAGNOSIS — G893 Neoplasm related pain (acute) (chronic): Secondary | ICD-10-CM | POA: Diagnosis not present

## 2018-08-24 DIAGNOSIS — C7951 Secondary malignant neoplasm of bone: Secondary | ICD-10-CM | POA: Diagnosis not present

## 2018-08-24 DIAGNOSIS — B370 Candidal stomatitis: Secondary | ICD-10-CM | POA: Diagnosis not present

## 2018-08-24 DIAGNOSIS — C3492 Malignant neoplasm of unspecified part of left bronchus or lung: Secondary | ICD-10-CM | POA: Diagnosis not present

## 2018-08-24 MED ORDER — SODIUM CHLORIDE 0.9% FLUSH
10.0000 mL | Freq: Once | INTRAVENOUS | Status: AC
  Administered 2018-08-24: 10 mL via INTRAVENOUS

## 2018-08-24 MED ORDER — HEPARIN SOD (PORK) LOCK FLUSH 100 UNIT/ML IV SOLN: 500.0000 [IU] | Freq: Once | INTRAVENOUS | Status: DC

## 2018-08-24 NOTE — Progress Notes (Signed)
Patient to treatment room for needle change with port.  Patient stated he did not want home health care taking care of his port and wants the CC to manage port dressings and needle changes each week.  Port deaccessed without difficulty.  Patient receives hydration at home by wife who is given the supplies by [employer].  Wife stated she flushes the port with heparin after the completion of each hydration.  Reviewed care of port and saline flushes with heparin with the wife with all questions asked and answered.  Port flushed easily with good blood return noted.  Dressing intact with no bruising or swelling noted at site.

## 2018-08-27 ENCOUNTER — Encounter (HOSPITAL_COMMUNITY): Payer: Self-pay | Admitting: *Deleted

## 2018-08-27 DIAGNOSIS — E860 Dehydration: Secondary | ICD-10-CM | POA: Diagnosis not present

## 2018-08-27 DIAGNOSIS — C7951 Secondary malignant neoplasm of bone: Secondary | ICD-10-CM | POA: Diagnosis not present

## 2018-08-27 DIAGNOSIS — Z791 Long term (current) use of non-steroidal anti-inflammatories (NSAID): Secondary | ICD-10-CM | POA: Diagnosis not present

## 2018-08-27 DIAGNOSIS — R112 Nausea with vomiting, unspecified: Secondary | ICD-10-CM | POA: Diagnosis not present

## 2018-08-27 DIAGNOSIS — C3490 Malignant neoplasm of unspecified part of unspecified bronchus or lung: Secondary | ICD-10-CM | POA: Diagnosis not present

## 2018-08-27 DIAGNOSIS — Z452 Encounter for adjustment and management of vascular access device: Secondary | ICD-10-CM | POA: Diagnosis not present

## 2018-08-27 NOTE — Progress Notes (Signed)
I received a call from Jerelene Redden, RN, patient's home health nurse, and she reports:  VS stable, abdomen taunt, urine amber/tea colored and decreased output, lungs clear, skin turgor fair, no lower extremity swelling and patient reports feeling full and feels like his abdomen is distended.    I gave report to Dr. Delton Coombes and he states for patient to decrease his fluids to only 500 ml over 2 hours / day at home infusion and for patient to keep his appointment with our office on Friday this week.    I returned call to Jerelene Redden, RN and gave her the new orders. She read back the order.

## 2018-08-28 ENCOUNTER — Ambulatory Visit (HOSPITAL_COMMUNITY): Payer: Medicare HMO

## 2018-08-28 ENCOUNTER — Encounter (HOSPITAL_COMMUNITY): Payer: Medicare HMO | Admitting: Dietician

## 2018-08-28 ENCOUNTER — Encounter (HOSPITAL_COMMUNITY): Payer: Self-pay | Admitting: Dietician

## 2018-08-28 ENCOUNTER — Ambulatory Visit (HOSPITAL_COMMUNITY): Payer: Medicare HMO | Admitting: Hematology

## 2018-08-28 NOTE — Progress Notes (Signed)
Nutrition Brief Note  RD scheduled to see pt today at his infusion. However, pt's infusions were canceled last week on 12/11 after MD felt pts performance status was too poor for any further tx. Hospice was recommended, but pt was not ready to accept and asked to take 1-2 weeks to see if he could regain enough strength to tolerate chemoherapy.   Today, RD called pt's home and spoke w/ spouse. Unfortunately, pt does not sound to have made any improvement. In fact, he sounds to be much worse. Spouse says pt is not eating or drinking at all because he is too nauseous. He also has stopped taking any meds that "are not essential" because "he cant tolerate anything on this stomach". She notes he fell this morning d/t dizziness. She says he is still getting IVF from Home health.   At this time, pt is too nauseated to take anything by mouth. Will discuss with nurse navigator about any potential IV medications for nausea, but it sounds that, given his continued decline, pt will not be a candidate for further tx.  Clinical nutrition to sign off.   Burtis Junes RD, LDN, CNSC Clinical Nutrition Available Tues-Sat via Pager: [phone] 08/28/2018 11:29 AM

## 2018-08-29 ENCOUNTER — Telehealth (HOSPITAL_COMMUNITY): Payer: Self-pay | Admitting: Surgery

## 2018-08-29 NOTE — Telephone Encounter (Signed)
Malachy Mood from [place] had left a voicemail wanting to confirm that Dr. Raliegh Ip is the ordering physician for this pt.  Spoke with Dr. Raliegh Ip and Lala Lund to confirm this, and then called Malachy Mood back to let her know that Dr. Raliegh Ip is the ordering physician.

## 2018-08-30 ENCOUNTER — Inpatient Hospital Stay (HOSPITAL_COMMUNITY): Payer: Medicare HMO

## 2018-08-30 DIAGNOSIS — C3490 Malignant neoplasm of unspecified part of unspecified bronchus or lung: Secondary | ICD-10-CM

## 2018-08-30 DIAGNOSIS — B370 Candidal stomatitis: Secondary | ICD-10-CM | POA: Diagnosis not present

## 2018-08-30 DIAGNOSIS — G893 Neoplasm related pain (acute) (chronic): Secondary | ICD-10-CM | POA: Diagnosis not present

## 2018-08-30 DIAGNOSIS — Z79899 Other long term (current) drug therapy: Secondary | ICD-10-CM | POA: Diagnosis not present

## 2018-08-30 DIAGNOSIS — Z87891 Personal history of nicotine dependence: Secondary | ICD-10-CM | POA: Diagnosis not present

## 2018-08-30 DIAGNOSIS — C3492 Malignant neoplasm of unspecified part of left bronchus or lung: Secondary | ICD-10-CM | POA: Diagnosis not present

## 2018-08-30 DIAGNOSIS — Z9225 Personal history of immunosupression therapy: Secondary | ICD-10-CM | POA: Diagnosis not present

## 2018-08-30 DIAGNOSIS — C787 Secondary malignant neoplasm of liver and intrahepatic bile duct: Secondary | ICD-10-CM | POA: Diagnosis not present

## 2018-08-30 DIAGNOSIS — R69 Illness, unspecified: Secondary | ICD-10-CM | POA: Diagnosis not present

## 2018-08-30 DIAGNOSIS — Z5112 Encounter for antineoplastic immunotherapy: Secondary | ICD-10-CM | POA: Diagnosis not present

## 2018-08-30 DIAGNOSIS — C7951 Secondary malignant neoplasm of bone: Secondary | ICD-10-CM | POA: Diagnosis not present

## 2018-08-30 LAB — CBC WITH DIFFERENTIAL/PLATELET
Abs Immature Granulocytes: 0.04 10*3/uL (ref 0.00–0.07)
BASOS PCT: 1 %
Basophils Absolute: 0 10*3/uL (ref 0.0–0.1)
Eosinophils Absolute: 0.3 10*3/uL (ref 0.0–0.5)
Eosinophils Relative: 6 %
HCT: 34.4 % — ABNORMAL LOW (ref 39.0–52.0)
Hemoglobin: 10.3 g/dL — ABNORMAL LOW (ref 13.0–17.0)
Immature Granulocytes: 1 %
Lymphocytes Relative: 8 %
Lymphs Abs: 0.4 10*3/uL — ABNORMAL LOW (ref 0.7–4.0)
MCH: 27.9 pg (ref 26.0–34.0)
MCHC: 29.9 g/dL — ABNORMAL LOW (ref 30.0–36.0)
MCV: 93.2 fL (ref 80.0–100.0)
Monocytes Absolute: 0.4 10*3/uL (ref 0.1–1.0)
Monocytes Relative: 9 %
NRBC: 0 % (ref 0.0–0.2)
Neutro Abs: 3.5 10*3/uL (ref 1.7–7.7)
Neutrophils Relative %: 75 %
Platelets: 153 10*3/uL (ref 150–400)
RBC: 3.69 MIL/uL — ABNORMAL LOW (ref 4.22–5.81)
RDW: 16.1 % — ABNORMAL HIGH (ref 11.5–15.5)
WBC: 4.6 10*3/uL (ref 4.0–10.5)

## 2018-08-30 LAB — COMPREHENSIVE METABOLIC PANEL
ALBUMIN: 3.1 g/dL — AB (ref 3.5–5.0)
ALT: 39 U/L (ref 0–44)
AST: 43 U/L — ABNORMAL HIGH (ref 15–41)
Alkaline Phosphatase: 91 U/L (ref 38–126)
Anion gap: 9 (ref 5–15)
BUN: 13 mg/dL (ref 6–20)
CHLORIDE: 103 mmol/L (ref 98–111)
CO2: 24 mmol/L (ref 22–32)
Calcium: 9.2 mg/dL (ref 8.9–10.3)
Creatinine, Ser: 0.9 mg/dL (ref 0.61–1.24)
GFR calc Af Amer: >60 mL/min (ref >60)
GFR calc non Af Amer: >60 mL/min (ref >60)
Glucose, Bld: 169 mg/dL — ABNORMAL HIGH (ref 70–99)
Potassium: 3.8 mmol/L (ref 3.5–5.1)
Sodium: 136 mmol/L (ref 135–145)
Total Bilirubin: 0.6 mg/dL (ref 0.3–1.2)
Total Protein: 7.2 g/dL (ref 6.5–8.1)

## 2018-08-30 LAB — LACTATE DEHYDROGENASE: LDH: 439 U/L — ABNORMAL HIGH (ref 98–192)

## 2018-08-30 LAB — TSH: TSH: 1.232 u[IU]/mL (ref 0.350–4.500)

## 2018-08-31 ENCOUNTER — Encounter (HOSPITAL_COMMUNITY): Payer: Self-pay | Admitting: Hematology

## 2018-08-31 ENCOUNTER — Ambulatory Visit (HOSPITAL_COMMUNITY): Payer: Medicare HMO | Admitting: Hematology

## 2018-08-31 ENCOUNTER — Inpatient Hospital Stay (HOSPITAL_COMMUNITY): Payer: Medicare HMO | Attending: Hematology | Admitting: Hematology

## 2018-08-31 VITALS — BP 132/78 | HR 132 | Temp 97.9°F | Resp 20 | Wt 157.0 lb

## 2018-08-31 DIAGNOSIS — C787 Secondary malignant neoplasm of liver and intrahepatic bile duct: Secondary | ICD-10-CM | POA: Diagnosis not present

## 2018-08-31 DIAGNOSIS — Z9225 Personal history of immunosupression therapy: Secondary | ICD-10-CM

## 2018-08-31 DIAGNOSIS — Z87891 Personal history of nicotine dependence: Secondary | ICD-10-CM | POA: Insufficient documentation

## 2018-08-31 DIAGNOSIS — G893 Neoplasm related pain (acute) (chronic): Secondary | ICD-10-CM

## 2018-08-31 DIAGNOSIS — C3490 Malignant neoplasm of unspecified part of unspecified bronchus or lung: Secondary | ICD-10-CM

## 2018-08-31 DIAGNOSIS — B370 Candidal stomatitis: Secondary | ICD-10-CM | POA: Insufficient documentation

## 2018-08-31 DIAGNOSIS — C3492 Malignant neoplasm of unspecified part of left bronchus or lung: Secondary | ICD-10-CM | POA: Diagnosis not present

## 2018-08-31 DIAGNOSIS — R69 Illness, unspecified: Secondary | ICD-10-CM | POA: Diagnosis not present

## 2018-08-31 DIAGNOSIS — C7951 Secondary malignant neoplasm of bone: Secondary | ICD-10-CM | POA: Diagnosis not present

## 2018-08-31 DIAGNOSIS — F5089 Other specified eating disorder: Secondary | ICD-10-CM | POA: Diagnosis not present

## 2018-08-31 DIAGNOSIS — R110 Nausea: Secondary | ICD-10-CM

## 2018-08-31 MED ORDER — HYDROMORPHONE HCL 1 MG/ML IJ SOLN
INTRAMUSCULAR | Status: AC
  Filled 2018-08-31: qty 1

## 2018-08-31 MED ORDER — FLUCONAZOLE 100 MG PO TABS: 100.0000 mg | ORAL_TABLET | Freq: Every day | ORAL | 0 refills | Status: DC

## 2018-08-31 MED ORDER — SODIUM CHLORIDE 0.9 % IV SOLN
INTRAVENOUS | Status: AC
  Administered 2018-08-31: 16:00:00 via INTRAVENOUS

## 2018-08-31 MED ORDER — SODIUM CHLORIDE 0.9% FLUSH: 10.0000 mL | INTRAVENOUS | Status: DC | PRN

## 2018-08-31 MED ORDER — HYDROMORPHONE HCL 1 MG/ML IJ SOLN
1.0000 mg | Freq: Once | INTRAMUSCULAR | Status: AC
  Administered 2018-08-31: 1 mg via INTRAVENOUS

## 2018-08-31 MED ORDER — PROMETHAZINE HCL 25 MG PO TABS: 25.0000 mg | ORAL_TABLET | Freq: Four times a day (QID) | ORAL | 0 refills | Status: AC | PRN

## 2018-08-31 MED ORDER — DRONABINOL 2.5 MG PO CAPS: 2.5000 mg | ORAL_CAPSULE | Freq: Two times a day (BID) | ORAL | 0 refills | Status: AC

## 2018-08-31 NOTE — Patient Instructions (Addendum)
[place] at [facility] Discharge Instructions  Received hydration and pain medication today. Follow-up as scheduled. Call clinic for any questions or concerns   Thank you for choosing [place] at [facility] to provide your oncology and hematology care.  To afford each patient quality time with our provider, please arrive at least 15 minutes before your scheduled appointment time.   If you have a lab appointment with the [place] please come in thru the  Main Entrance and check in at the main information desk  You need to re-schedule your appointment should you arrive 10 or more minutes late.  We strive to give you quality time with our providers, and arriving late affects you and other patients whose appointments are after yours.  Also, if you no show three or more times for appointments you may be dismissed from the clinic at the providers discretion.     Again, thank you for choosing [facility].  Our hope is that these requests will decrease the amount of time that you wait before being seen by our physicians.       _____________________________________________________________  Should you have questions after your visit to [facility], please contact our office at [phone] between the hours of 8:00 a.m. and 4:30 p.m.  Voicemails left after 4:00 p.m. will not be returned until the following business day.  For prescription refill requests, have your pharmacy contact our office and allow 72 hours.    Cancer Center Support Programs:   > Cancer Support Group  2nd Tuesday of the month 1pm-2pm, Journey Room

## 2018-08-31 NOTE — Progress Notes (Signed)
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     06/15/2018 -  Chemotherapy    The patient had ipilimumab (YERVOY) 90 mg in sodium chloride 0.9 % 50 mL chemo infusion, 1 mg/kg = 90 mg, Intravenous,  Once, 3 of 4 cycles Administration: 90 mg (06/22/2018), 90 mg (07/13/2018), 85 mg (08/03/2018) nivolumab (OPDIVO) 270 mg in sodium chloride 0.9 % 100 mL chemo infusion, 3 mg/kg = 270 mg, Intravenous, Once, 3 of 8 cycles Administration: 270 mg (06/22/2018), 240 mg (07/13/2018), 240 mg (08/03/2018)  for chemotherapy treatment.       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 Snelson 57 y.o. male returns for routine follow-up for extensive stage small cell lung cancer. He is here today with his wife. He did walk up to the unit today but got nauseous and vomited when he get here. The nausea is worse with movement. The scopolamine patches are working some  for him. He also has thrush on his tongue and throat again. He is trying to eat but has early satiety or the food doesn't taste good. His taste buds have been messed up since he received chemo. Denies any diarrhea. Denies any new pains. Had not noticed any recent bleeding such as epistaxis, hematuria or hematochezia. Denies recent chest pain on exertion, pre-syncopal episodes, or palpitations. Denies any numbness or tingling in hands or feet. Denies any recent fevers, infections, or recent hospitalizations.     REVIEW OF SYSTEMS:  Review of Systems  Gastrointestinal: Positive for nausea.  All other systems reviewed and are negative.    PAST MEDICAL/SURGICAL HISTORY:  Past Medical History:  Diagnosis Date  . Anxiety   . Asthma   . COPD (chronic obstructive pulmonary disease) ([place])   . DDD (degenerative disc disease)   . Diverticulitis   . GERD (gastroesophageal reflux disease)   . Headache   . Hepatitis    in the late 70's   . HTN (hypertension)   . Mediastinal adenopathy   . Seizures ([place])    as of 2019 none for 20 years  . Sleep apnea    does not  use cpap  . Stroke [facility])    TIA - Feb. 2018   Past Surgical History:  Procedure Laterality Date  . CERVICAL DISC SURGERY     X4  . CHOLECYSTECTOMY    . COLONOSCOPY  08/24/2011   Procedure: COLONOSCOPY;  Surgeon: Daneil Dolin, MD;  Location: [facility] SU[facility];  Service: Endoscopy;  Laterality: N/A;  7:30AM  . ELBOW SURGERY     right  . PORTACATH PLACEMENT Right 12/01/2017   Procedure: INSERTION PORT-A-CATH;  Surgeon: Aviva Signs, MD;  Location: [facility] ORS;  Service: General;  Laterality: Right;  Marland Kitchen VIDEO BRONCHOSCOPY WITH ENDOBRONCHIAL ULTRASOUND N/A 10/31/2017   Procedure: VIDEO BRONCHOSCOPY WITH ENDOBRONCHIAL ULTRASOUND;  Surgeon: Ivin Poot, MD;  Location: [place];  Service: Thoracic;  Laterality: N/A;  . VIDEO BRONCHOSCOPY WITH ENDOBRONCHIAL ULTRASOUND N/A 11/22/2017   Procedure: VIDEO BRONCHOSCOPY WITH ENDOBRONCHIAL  ULTRASOUND;  Surgeon: Melrose Nakayama, MD;  Location: [place];  Service: Thoracic;  Laterality: N/A;     SOCIAL HISTORY:  Social History   Socioeconomic History  . Marital status: Married    Spouse name: Not on file  . Number of children: Not on file  . Years of education: Not on file  . Highest education level: Not on file  Occupational History  . Occupation: disability  Social Needs  . Financial resource strain: Not on file  . Food insecurity:    Worry: Not on file    Inability: Not on file  . Transportation needs:    Medical: No    Non-medical: No  Tobacco Use  . Smoking status: Former Smoker    Packs/day: 1.50    Years: 30.00    Pack years: 45.00    Types: Cigarettes    Last attempt to quit: 10/13/2017    Years since quitting: 0.8  . Smokeless tobacco: Never Used  Substance and Sexual Activity  . Alcohol use: No  . Drug use: No  . Sexual activity: Yes    Birth control/protection: None  Lifestyle  . Physical activity:    Days per week: Not on file    Minutes per session: Not on file  . Stress: Not on file  Relationships  . Social connections:    Talks on phone: Not on file    Gets together: Not on file    Attends religious service: Not on file    Active member of club or organization: Not on file    Attends meetings of clubs or organizations: Not on file    Relationship status: Not on file  . Intimate partner violence:    Fear of current or ex partner: Not on file    Emotionally abused: Not on file    Physically abused: Not on file    Forced sexual activity: Not on file  Other Topics Concern  . Not on file  Social History Narrative   06-20-18 Unable to ask abuse questions wife with him today.   07-25-18  Unable to ask abuse questions wife with him today.    FAMILY HISTORY:  Family History  Problem Relation Age of Onset  . Hypertension Father   . Heart attack Father   . Kidney failure Mother   . Diabetes Mother   . Hypertension Mother   .  Colon cancer Neg Hx   . Anesthesia problems Neg Hx   . Hypotension Neg Hx   . Malignant hyperthermia Neg Hx   . Pseudochol deficiency Neg Hx     CURRENT MEDICATIONS:  Outpatient Encounter Medications as of 08/31/2018  Medication Sig Note  . Absorbable Collagen Hemostat (ACTIFOAM COLLAGEN SPONGE) MISC by Misc.(Non-Drug; Combo Route) route.   Marland Kitchen albuterol (PROVENTIL HFA;VENTOLIN HFA) 108 (90 BASE) MCG/ACT inhaler Inhale 2 puffs into the lungs every 6 (six) hours as needed for wheezing or shortness of breath.    Marland Kitchen albuterol (PROVENTIL) (2.5  MG/3ML) 0.083% nebulizer solution Take 2.5 mg by nebulization every 6 (six) hours as needed for wheezing or shortness of breath.    . alprazolam (XANAX) 2 MG tablet Take 2 mg by mouth every 6 (six) hours as needed for sleep or anxiety.  07/20/2018: Takes a 1/4 of a tablet to help sleep to prevent next day drowsiness.   . Calcium-Magnesium-Zinc 333-133-5 MG TABS Take 3 tablets by mouth daily.    . cyclobenzaprine (FLEXERIL) 10 MG tablet Take 10 mg by mouth daily as needed for muscle spasms.    . diclofenac sodium (VOLTAREN) 1 % GEL Apply 2 g topically 4 (four) times daily. Apply to left forearm phlebitic area   . docusate sodium (COLACE) 100 MG capsule Take 100 mg by mouth 2 (two) times daily.   . Flaxseed, Linseed, (FLAXSEED OIL PO) Take 1-2 capsules by mouth daily.   . fluconazole (DIFLUCAN) 100 MG tablet Take 1 tablet (100 mg total) by mouth daily.   . fluticasone (FLONASE) 50 MCG/ACT nasal spray Place 2 sprays into both nostrils daily as needed for allergies.    Marland Kitchen levETIRAcetam (KEPPRA) 500 MG tablet Take 500 mg by mouth every evening.    . lidocaine (LIDODERM) 5 % Place 1 patch onto the skin daily. Remove & Discard patch within 12 hours or as directed by MD   . lidocaine (XYLOCAINE) 2 % solution Use as directed 15 mLs in the mouth or throat every 4 (four) hours as needed for mouth pain.   Marland Kitchen lidocaine-prilocaine (EMLA) cream APPLY TO PORTACATH SITE AS  DIRECTED (Patient taking differently: Apply 1 application topically once. )   . lisinopril (PRINIVIL,ZESTRIL) 20 MG tablet Take 20 mg by mouth daily.    . meclizine (ANTIVERT) 12.5 MG tablet Take 1 tablet (12.5 mg total) by mouth 3 (three) times daily as needed for dizziness.   . MULTIPLE VITAMINS PO Take 1 tablet by mouth daily.    . naproxen (NAPROSYN) 500 MG tablet Take 500 mg by mouth at bedtime.    Marland Kitchen oxyCODONE (ROXICODONE) 15 MG immediate release tablet Take 15 mg by mouth 4 (four) times daily as needed for pain.    Marland Kitchen oxycodone (ROXICODONE) 30 MG immediate release tablet Take 30 mg by mouth every 4 (four) hours as needed for pain.   . pantoprazole (PROTONIX) 40 MG tablet Take 40 mg by mouth at bedtime.    . polyethylene glycol powder (GLYCOLAX/MIRALAX) powder Take 17 g by mouth daily as needed for moderate constipation.   . promethazine (PHENERGAN) 25 MG tablet Take 1 tablet (25 mg total) by mouth every 6 (six) hours as needed for nausea or vomiting.   Marland Kitchen scopolamine (TRANSDERM-SCOP) 1 MG/3DAYS Place 1 patch (1.5 mg total) onto the skin every 3 (three) days.   . sucralfate (CARAFATE) 1 g tablet Take 1 tablet (1 g total) by mouth 4 (four) times daily. 07/20/2018: Takes two hours before and two hours after other medications.   . tamsulosin (FLOMAX) 0.4 MG CAPS capsule Take 0.4 mg by mouth every evening.    . temazepam (RESTORIL) 30 MG capsule Take 1 capsule (30 mg total) by mouth at bedtime as needed for sleep.   Marland Kitchen triamcinolone cream (KENALOG) 0.1 % Apply 1 application topically 2 (two) times daily.    . TURMERIC PO Take 1 capsule by mouth daily.   . [DISCONTINUED] promethazine (PHENERGAN) 25 MG tablet Take 1 tablet (25 mg total) by mouth every 6 (six) hours as needed for nausea or vomiting.   Marland Kitchen  dronabinol (MARINOL) 2.5 MG capsule Take 1 capsule (2.5 mg total) by mouth 2 (two) times daily before lunch and supper.   . fluconazole (DIFLUCAN) 100 MG tablet Take 1 tablet (100 mg total) by mouth  daily. Take 2 pills the first day    Facility-Administered Encounter Medications as of 08/31/2018  Medication  . 0.9 %  sodium chloride infusion  . 0.9 %  sodium chloride infusion  . [COMPLETED] HYDROmorphone (DILAUDID) injection 1 mg  . morphine 2 MG/ML injection 2 mg  . sodium chloride flush (NS) 0.9 % injection 10 mL    ALLERGIES:  Allergies  Allergen Reactions  . Tecentriq [Atezolizumab] Rash  . Xgeva [Denosumab] Rash     PHYSICAL EXAM:  ECOG Performance status: 1  Vitals:   08/31/18 1423  BP: 132/78  Pulse: (!) 132  Resp: 20  Temp: 97.9 F (36.6 C)  SpO2: 100%   Filed Weights   08/31/18 1423  Weight: 157 lb (71.2 kg)    Physical Exam Constitutional:      Appearance: He is ill-appearing.  Cardiovascular:     Rate and Rhythm: Normal rate and regular rhythm.     Heart sounds: Normal heart sounds.  Pulmonary:     Effort: Pulmonary effort is normal.     Breath sounds: Normal breath sounds.  Musculoskeletal:     Comments: wheelchair  Skin:    General: Skin is warm and dry.     Coloration: Skin is pale.  Neurological:     Mental Status: He is alert and oriented to person, place, and time. Mental status is at baseline.  Psychiatric:        Mood and Affect: Mood normal.        Behavior: Behavior normal.        Thought Content: Thought content normal.        Judgment: Judgment normal.   Abdomen is soft with mild vague tenderness in the right upper quadrant.  No ascites. Extremities: No edema or cyanosis.   LABORATORY DATA:  I have reviewed the labs as listed.  CBC    Component Value Date/Time   WBC 4.6 08/30/2018 1036   RBC 3.69 (L) 08/30/2018 1036   HGB 10.3 (L) 08/30/2018 1036   HCT 34.4 (L) 08/30/2018 1036   PLT 153 08/30/2018 1036   MCV 93.2 08/30/2018 1036   MCH 27.9 08/30/2018 1036   MCHC 29.9 (L) 08/30/2018 1036   RDW 16.1 (H) 08/30/2018 1036   LYMPHSABS 0.4 (L) 08/30/2018 1036   MONOABS 0.4 08/30/2018 1036   EOSABS 0.3 08/30/2018 1036     BASOSABS 0.0 08/30/2018 1036   CMP Latest Ref Rng & Units 08/30/2018 08/05/2018 07/30/2018  Glucose 70 - 99 mg/dL 169(H) 122(H) 97  BUN 6 - 20 mg/dL 13 14 10   Creatinine 0.61 - 1.24 mg/dL 0.90 1.02 1.01  Sodium 135 - 145 mmol/L 136 137 140  Potassium 3.5 - 5.1 mmol/L 3.8 4.3 3.2(L)  Chloride 98 - 111 mmol/L 103 103 109  CO2 22 - 32 mmol/L 24 25 22   Calcium 8.9 - 10.3 mg/dL 9.2 9.6 8.3(L)  Total Protein 6.5 - 8.1 g/dL 7.2 7.5 6.7  Total Bilirubin 0.3 - 1.2 mg/dL 0.6 0.7 0.8  Alkaline Phos 38 - 126 U/L 91 53 51  AST 15 - 41 U/L 43(H) 35 30  ALT 0 - 44 U/L 39 18 31       DIAGNOSTIC IMAGING:  I have independently reviewed the scans and discussed with the  patient.   I have reviewed Francene Finders, NP's note and agree with the documentation.  I personally performed a face-to-face visit, made revisions and my assessment and plan is as follows.    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  Extensive stage small cell lung cancer: -4 cycles of carboplatin, etoposide and Atezolizumab from 11/27/2017 through 01/29/2018 -CT scan of the chest, abdomen after 3 cycles of chemo immunotherapy on 01/25/2018 shows near complete resolution of the adenopathy and lung mass.  Sclerosis and bone mets noted.  - Maintenance Atezolizumab started on 02/28/2018, second dose on 03/22/2018, each time resulting in severe urticarial rash on the upper trunk and lower extremities, improved on steroid therapy. -He has gotten severe urticarial rash from Atezolizumab.  I have discussed the results of the CT scan of the chest, abdomen and pelvis dated 04/06/2018 which showed no evidence of disease progression.  Previously measured mediastinal lymph nodes are barely detectable.  Dominant left hilar lymph node measures 2.1 x 0.9 cm.  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  Mild T11 compression fracture with less than 10% height loss was also seen. - I have sent him  for a second opinion with Dr. Elmyra Ricks at [facility]. -He was evaluated by Dr. Berta Minor on 04/19/2018.  He was also referred to [facility] dermatology Dr. Royanne Foots. -It was suggested to give another trial of Atezolizumab.  He discontinued dexamethasone 4 mg daily and started taking prednisone 10 mg daily.  He was also prescribed triamcinolone 0.1% cream to be used twice daily. - He developed breathing difficulty with shortness of breath on exertion and on and off chest pain for the last 3 weeks.  He reportedly went to the ER on 06/04/2018 with a did a CT scan of the chest PE protocol and CT of the abdomen and pelvis. - I have reviewed the images of the CT scan independently and discussed the results with the patient.  It showed progression of the left hilar adenopathy compared to the prior exam from 2 months ago.  This currently measures about 2.3 x 4 cm in size.  Subcarinal lymph node also increased in size compared to prior exam.  No new areas were seen. - Atezolizumab discontinued after last dose on 05/18/2018 secondary to progression. - He was evaluated by Dr. Elmyra Ricks at [facility].  Combination immunotherapy with ipilimumab and nivolumab was recommended, as responses with chemotherapy is very low as his cancer has progressed within 3 months of platinum induction regimen. - Cycle 1 of combination immunotherapy was given on 06/22/2018.  Patient has been having low-grade fevers at nighttime.  He was evaluated in the ER on 07/03/2018 and all the work-up was negative.  Blood cultures did not show any growth. - MRI of the brain on 06/28/2018 showed 5 new small round enhancing brain lesions compatible with metastasis.  He finished whole brain radiation therapy on 07/13/2018. - Cycle 2 of immunotherapy on 07/13/2018. -He was admitted to the hospital from 07/20/2018 through 07/25/2018 with malignant otitis externa.  He received vancomycin and Zosyn while inpatient and is  currently receiving cefepime which will finish today. - Cycle 3 immunotherapy on 08/03/2018. - CT CAP dated 08/21/2018 shows interval development of numerous hypervascular hepatic lesions consistent with metastatic disease.  Largest lesion measures 2 cm.  Widespread bone metastatic disease is stable.  -he does have back pain which radiates laterally, predominantly on the left side.  He is currently taking oxycodone 15 mg alternating with 30 mg. - At last visit on 08/22/2018, I did not consider him as a good candidate for any chemotherapy due to his declining performance status.  He wanted to wait 1 to 2 weeks and see if he improves. - He has been receiving IV fluids at home. -According to the wife, the patient is most fully in the bed during the daytime.  His oral intake has reduced drastically. - At last visit we started him on scopolamine patch and Phenergan.  Both are helping.  He is also using Compazine and Zofran as needed. - He reports that he does not have any appetite.  We will start him on Marinol 2.5 mg twice daily and increase it to 5 mg twice daily as needed. - I do not believe he is a candidate for any systemic therapy.  He reports that he made an appointment to see Dr. Berta Minor at [facility] in the first week of January. - We have sent prescription for Diflucan 100 mg for 5 days for oral thrush.  2. Bone metastasis: - He had 5 teeth pulled in the lower jaw on 07/31/2018.  His Zometa is on hold.       Orders placed this encounter:  No orders of the defined types were placed in this encounter.     Derek Jack, MD [place] [phone]

## 2018-08-31 NOTE — Progress Notes (Signed)
Tameron Lama [place] tolerated hydration and pain medication well without complaints or incident. Port de-accessed then re-accessed with 20 gauge with good blood return noted. Port was saline locked and left accessed for continued use. Pt discharged via wheelchair in satisfactory condition accompanied by his wife

## 2018-08-31 NOTE — Assessment & Plan Note (Addendum)
1.  Extensive stage small cell lung cancer: -4 cycles of carboplatin, etoposide and Atezolizumab from 11/27/2017 through 01/29/2018 -CT scan of the chest, abdomen after 3 cycles of chemo immunotherapy on 01/25/2018 shows near complete resolution of the adenopathy and lung mass.  Sclerosis and bone mets noted.  - Maintenance Atezolizumab started on 02/28/2018, second dose on 03/22/2018, each time resulting in severe urticarial rash on the upper trunk and lower extremities, improved on steroid therapy. -He has gotten severe urticarial rash from Atezolizumab.  I have discussed the results of the CT scan of the chest, abdomen and pelvis dated 04/06/2018 which showed no evidence of disease progression.  Previously measured mediastinal lymph nodes are barely detectable.  Dominant left hilar lymph node measures 2.1 x 0.9 cm.  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  Mild T11 compression fracture with less than 10% height loss was also seen. - I have sent him for a second opinion with Dr. Elmyra Ricks at [facility]. -He was evaluated by Dr. Berta Minor on 04/19/2018.  He was also referred to [facility] dermatology Dr. Royanne Foots. -It was suggested to give another trial of Atezolizumab.  He discontinued dexamethasone 4 mg daily and started taking prednisone 10 mg daily.  He was also prescribed triamcinolone 0.1% cream to be used twice daily. - He developed breathing difficulty with shortness of breath on exertion and on and off chest pain for the last 3 weeks.  He reportedly went to the ER on 06/04/2018 with a did a CT scan of the chest PE protocol and CT of the abdomen and pelvis. - I have reviewed the images of the CT scan independently and discussed the results with the patient.  It showed progression of the left hilar adenopathy compared to the prior exam from 2 months ago.  This currently measures about 2.3 x 4 cm in size.  Subcarinal lymph node also increased in size compared to prior exam.   No new areas were seen. - Atezolizumab discontinued after last dose on 05/18/2018 secondary to progression. - He was evaluated by Dr. Elmyra Ricks at [facility].  Combination immunotherapy with ipilimumab and nivolumab was recommended, as responses with chemotherapy is very low as his cancer has progressed within 3 months of platinum induction regimen. - Cycle 1 of combination immunotherapy was given on 06/22/2018.  Patient has been having low-grade fevers at nighttime.  He was evaluated in the ER on 07/03/2018 and all the work-up was negative.  Blood cultures did not show any growth. - MRI of the brain on 06/28/2018 showed 5 new small round enhancing brain lesions compatible with metastasis.  He finished whole brain radiation therapy on 07/13/2018. - Cycle 2 of immunotherapy on 07/13/2018. -He was admitted to the hospital from 07/20/2018 through 07/25/2018 with malignant otitis externa.  He received vancomycin and Zosyn while inpatient and is currently receiving cefepime which will finish today. - Cycle 3 immunotherapy on 08/03/2018. - CT CAP dated 08/21/2018 shows interval development of numerous hypervascular hepatic lesions consistent with metastatic disease.  Largest lesion measures 2 cm.  Widespread bone metastatic disease is stable.  -he does have back pain which radiates laterally, predominantly on the left side.  He is currently taking oxycodone 15 mg alternating with 30 mg. - At last visit on 08/22/2018, I did not consider him as a good candidate for any chemotherapy due to his declining performance status.  He wanted to wait 1 to 2 weeks and see if he improves. -  He has been receiving IV fluids at home. -According to the wife, the patient is most fully in the bed during the daytime.  His oral intake has reduced drastically. - At last visit we started him on scopolamine patch and Phenergan.  Both are helping.  He is also using Compazine and Zofran as needed. - He reports that  he does not have any appetite.  We will start him on Marinol 2.5 mg twice daily and increase it to 5 mg twice daily as needed. - I do not believe he is a candidate for any systemic therapy.  He reports that he made an appointment to see Dr. Berta Minor at [facility] in the first week of January. - We have sent prescription for Diflucan 100 mg for 5 days for oral thrush.  2. Bone metastasis: - He had 5 teeth pulled in the lower jaw on 07/31/2018.  His Zometa is on hold.

## 2018-09-03 ENCOUNTER — Other Ambulatory Visit (HOSPITAL_COMMUNITY): Payer: Medicare HMO

## 2018-09-03 ENCOUNTER — Ambulatory Visit (HOSPITAL_COMMUNITY): Payer: Medicare HMO | Admitting: Hematology

## 2018-09-03 DIAGNOSIS — C7951 Secondary malignant neoplasm of bone: Secondary | ICD-10-CM | POA: Diagnosis not present

## 2018-09-03 DIAGNOSIS — E860 Dehydration: Secondary | ICD-10-CM | POA: Diagnosis not present

## 2018-09-03 DIAGNOSIS — C3490 Malignant neoplasm of unspecified part of unspecified bronchus or lung: Secondary | ICD-10-CM | POA: Diagnosis not present

## 2018-09-06 ENCOUNTER — Other Ambulatory Visit: Payer: Self-pay | Admitting: Radiation Oncology

## 2018-09-06 ENCOUNTER — Other Ambulatory Visit: Payer: Self-pay

## 2018-09-06 ENCOUNTER — Encounter: Payer: Self-pay | Admitting: Radiation Oncology

## 2018-09-06 ENCOUNTER — Ambulatory Visit
Admission: RE | Admit: 2018-09-06 | Discharge: 2018-09-06 | Disposition: A | Discharge status: Home / Self Care | Payer: Medicare HMO | Source: Ambulatory Visit | Attending: Radiation Oncology | Admitting: Radiation Oncology

## 2018-09-06 VITALS — BP 132/87 | HR 117 | Temp 98.1°F | Resp 17 | Wt 160.8 lb

## 2018-09-06 DIAGNOSIS — F419 Anxiety disorder, unspecified: Secondary | ICD-10-CM | POA: Diagnosis not present

## 2018-09-06 DIAGNOSIS — Z79899 Other long term (current) drug therapy: Secondary | ICD-10-CM | POA: Diagnosis not present

## 2018-09-06 DIAGNOSIS — R110 Nausea: Secondary | ICD-10-CM | POA: Diagnosis not present

## 2018-09-06 DIAGNOSIS — K5730 Diverticulosis of large intestine without perforation or abscess without bleeding: Secondary | ICD-10-CM | POA: Diagnosis not present

## 2018-09-06 DIAGNOSIS — C3492 Malignant neoplasm of unspecified part of left bronchus or lung: Secondary | ICD-10-CM | POA: Insufficient documentation

## 2018-09-06 DIAGNOSIS — J90 Pleural effusion, not elsewhere classified: Secondary | ICD-10-CM | POA: Insufficient documentation

## 2018-09-06 DIAGNOSIS — R42 Dizziness and giddiness: Secondary | ICD-10-CM | POA: Diagnosis not present

## 2018-09-06 DIAGNOSIS — I700 Atherosclerosis of aorta: Secondary | ICD-10-CM | POA: Diagnosis not present

## 2018-09-06 DIAGNOSIS — C7931 Secondary malignant neoplasm of brain: Secondary | ICD-10-CM | POA: Diagnosis not present

## 2018-09-06 DIAGNOSIS — R69 Illness, unspecified: Secondary | ICD-10-CM | POA: Diagnosis not present

## 2018-09-06 DIAGNOSIS — C7951 Secondary malignant neoplasm of bone: Secondary | ICD-10-CM | POA: Insufficient documentation

## 2018-09-06 DIAGNOSIS — K769 Liver disease, unspecified: Secondary | ICD-10-CM | POA: Insufficient documentation

## 2018-09-06 DIAGNOSIS — C3490 Malignant neoplasm of unspecified part of unspecified bronchus or lung: Secondary | ICD-10-CM

## 2018-09-06 MED ORDER — LORAZEPAM 0.5 MG PO TABS
0.5000 mg | ORAL_TABLET | Freq: Once | ORAL | Status: AC
  Administered 2018-09-06: 0.5 mg via ORAL
  Filled 2018-09-06: qty 1

## 2018-09-06 MED ORDER — HYDROMORPHONE HCL 1 MG/ML IJ SOLN
1.0000 mg | Freq: Once | INTRAMUSCULAR | Status: AC
  Administered 2018-09-06: 1 mg via INTRAVENOUS
  Filled 2018-09-06: qty 1

## 2018-09-06 NOTE — Progress Notes (Signed)
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 In summary this is a pleasant 57 y.o. gentleman who has a diagnosis of extensive stage small cell lung cancer of the left lung who recently completed a palliative course of radiotherapy to the left lung, and T/L spine in addition to whole brain radiotherapy to treat brain metastases. He has recently been off systemic therapy due to performance status. He is scheduled to see Dr Berta Minor at [facility] on 09/20/18.                               On review of systems, the patient states he is doing okay but on further discussion it sounds as though he's very down about not being a candidate for systemic therapy but admits to progressive weigh loss and poor appetite. He is to begin Merinol to see if this helps. He has had persistent nausea and dizziness as well as dry heaving. He had recent head CT while he was hospitalized which showed an inner ear effusion along the left. He has been using Zofran, Compazine, and Phenergan which are not helping his symptoms. He also has a Scopalamine patch which he states has made him so dry, and his saliva is so thick he feels like he chokes on it. He describes persistent pain in his mid and low spine and states he can't tell a difference since radiation was given regarding his shooting pain. He continues on his regimen of Oxycodone. He's been on this for about 10 years and his pain  predates his cancer, however he reports he does not want to try any long acting medication due to how it made him feel many years ago. He is also concerned about how a fentanyl patch could pose concerns about absorption. He also admits that he feels there is a stigma about these medications and that they're given to patients who are dying, and he doesn't want to be in that same category. He denies any loss of control of his bowel or bladder, or progressive weakness. No other complaints are noted.  ALLERGIES:  is allergic to tecentriq [atezolizumab] and xgeva [denosumab].  Meds: Current Outpatient Medications  Medication Sig Dispense Refill  . Absorbable Collagen Hemostat (ACTIFOAM COLLAGEN SPONGE) MISC by Misc.(Non-Drug; Combo Route) route.    Marland Kitchen albuterol (PROVENTIL HFA;VENTOLIN HFA) 108 (90 BASE) MCG/ACT inhaler Inhale 2 puffs into the lungs every 6 (six) hours as needed for wheezing or shortness of breath.     Marland Kitchen albuterol (PROVENTIL) (2.5 MG/3ML) 0.083% nebulizer solution Take 2.5 mg by nebulization every 6 (six) hours as needed for wheezing or shortness of breath.     . alprazolam (XANAX) 2 MG tablet Take 2 mg by mouth every 6 (six) hours as needed for sleep or anxiety.     . Calcium-Magnesium-Zinc 333-133-5 MG TABS Take 3 tablets by mouth daily.     Marland Kitchen  cyclobenzaprine (FLEXERIL) 10 MG tablet Take 10 mg by mouth daily as needed for muscle spasms.     . diclofenac sodium (VOLTAREN) 1 % GEL Apply 2 g topically 4 (four) times daily. Apply to left forearm phlebitic area 100 g 1  . docusate sodium (COLACE) 100 MG capsule Take 100 mg by mouth 2 (two) times daily.    Marland Kitchen dronabinol (MARINOL) 2.5 MG capsule Take 1 capsule (2.5 mg total) by mouth 2 (two) times daily before lunch and supper. 60 capsule 0  . Flaxseed, Linseed, (FLAXSEED OIL PO) Take 1-2 capsules by mouth daily.    . fluconazole (DIFLUCAN) 100 MG tablet Take 1 tablet (100 mg total) by mouth daily. 5 tablet 0  . fluconazole (DIFLUCAN) 100 MG  tablet Take 1 tablet (100 mg total) by mouth daily. Take 2 pills the first day 6 tablet 0  . fluticasone (FLONASE) 50 MCG/ACT nasal spray Place 2 sprays into both nostrils daily as needed for allergies.     Marland Kitchen levETIRAcetam (KEPPRA) 500 MG tablet Take 500 mg by mouth every evening.     . lidocaine (LIDODERM) 5 % Place 1 patch onto the skin daily. Remove & Discard patch within 12 hours or as directed by MD 30 patch 0  . lidocaine (XYLOCAINE) 2 % solution Use as directed 15 mLs in the mouth or throat every 4 (four) hours as needed for mouth pain. 480 mL 1  . lidocaine-prilocaine (EMLA) cream APPLY TO PORTACATH SITE AS DIRECTED (Patient taking differently: Apply 1 application topically once. ) 30 g 0  . lisinopril (PRINIVIL,ZESTRIL) 20 MG tablet Take 20 mg by mouth daily.     . meclizine (ANTIVERT) 12.5 MG tablet Take 1 tablet (12.5 mg total) by mouth 3 (three) times daily as needed for dizziness. 30 tablet 0  . MULTIPLE VITAMINS PO Take 1 tablet by mouth daily.     . naproxen (NAPROSYN) 500 MG tablet Take 500 mg by mouth at bedtime.     Marland Kitchen oxyCODONE (ROXICODONE) 15 MG immediate release tablet Take 15 mg by mouth 4 (four) times daily as needed for pain.     Marland Kitchen oxycodone (ROXICODONE) 30 MG immediate release tablet Take 30 mg by mouth every 4 (four) hours as needed for pain.    . pantoprazole (PROTONIX) 40 MG tablet Take 40 mg by mouth at bedtime.     . polyethylene glycol powder (GLYCOLAX/MIRALAX) powder Take 17 g by mouth daily as needed for moderate constipation.    . promethazine (PHENERGAN) 25 MG tablet Take 1 tablet (25 mg total) by mouth every 6 (six) hours as needed for nausea or vomiting. 30 tablet 0  . scopolamine (TRANSDERM-SCOP) 1 MG/3DAYS Place 1 patch (1.5 mg total) onto the skin every 3 (three) days. 10 patch 12  . sucralfate (CARAFATE) 1 g tablet Take 1 tablet (1 g total) by mouth 4 (four) times daily. 120 tablet 2  . tamsulosin (FLOMAX) 0.4 MG CAPS capsule Take 0.4 mg by mouth every  evening.     . temazepam (RESTORIL) 30 MG capsule Take 1 capsule (30 mg total) by mouth at bedtime as needed for sleep. 30 capsule 1  . triamcinolone cream (KENALOG) 0.1 % Apply 1 application topically 2 (two) times daily.     . TURMERIC PO Take 1 capsule by mouth daily.    Marland Kitchen gabapentin (NEURONTIN) 300 MG capsule Take 1 capsule (300 mg total) by mouth 3 (three) times daily. 90 capsule 0  . LORazepam (ATIVAN) 0.5 MG  tablet Take 1-2 tablets (0.5-1 mg total) by mouth every 6 (six) hours as needed for anxiety (or nausea). 60 tablet 0   No current facility-administered medications for this encounter.    Facility-Administered Medications Ordered in Other Encounters  Medication Dose Route Frequency Provider Last Rate Last Dose  . 0.9 %  sodium chloride infusion   Intravenous Continuous Derek Jack, MD 500 mL/hr at 07/16/18 1234    . morphine 2 MG/ML injection 2 mg  2 mg Intravenous Q1H PRN Derek Jack, MD   2 mg at 07/16/18 1345    Physical Findings:  weight is 160 lb 12.8 oz (72.9 kg). His oral temperature is 98.1 F (36.7 C). His blood pressure is 132/87 and his pulse is 117 (abnormal). His respiration is 17 and oxygen saturation is 100%.  Pain Assessment Pain Score: 5 /10 In general this is a chronically ill appearing caucasian male in no acute distress. He's alert and oriented x4 and appropriate throughout the examination. Cardiopulmonary assessment is negative for acute distress and he exhibits normal effort. Evaluation of bilateral external auditory canals reveal serous fluid posterior to the TMs bilaterally, L>R.   Lab Findings: Lab Results  Component Value Date   WBC 4.6 08/30/2018   HGB 10.3 (L) 08/30/2018   HCT 34.4 (L) 08/30/2018   MCV 93.2 08/30/2018   PLT 153 08/30/2018     Radiographic Findings: Ct Chest W Contrast  Result Date: 08/21/2018 CLINICAL DATA:  57 year old male with extensive stage small cell lung cancer undergoing ongoing chemotherapy. Severe  right-sided groin pain. Nausea and diaphoresis today. EXAM: CT CHEST, ABDOMEN, AND PELVIS WITH CONTRAST TECHNIQUE: Multidetector CT imaging of the chest, abdomen and pelvis was performed following the standard protocol during bolus administration of intravenous contrast. CONTRAST:  154mL ISOVUE-300 IOPAMIDOL (ISOVUE-300) INJECTION 61% COMPARISON:  CT the chest, abdomen and pelvis 06/04/2018. FINDINGS: CT CHEST FINDINGS Cardiovascular: Heart size is normal. There is no significant pericardial fluid, thickening or pericardial calcification. Mild atherosclerosis in the thoracic aorta. No definite coronary artery calcifications identified. Right subclavian single-lumen porta cath with tip terminating in the distal superior vena cava. Mediastinum/Nodes: No pathologically enlarged mediastinal or hilar lymph nodes. Esophagus is unremarkable in appearance. No axillary lymphadenopathy. Lungs/Pleura: Stable nodules in the periphery of the left upper lobe measuring up to 5 mm (axial image 59 of series 4). No other new suspicious appearing pulmonary nodules or masses are noted. No acute consolidative airspace disease. Trace bilateral pleural effusions lying dependently. Musculoskeletal: Multiple sclerotic lesions are again noted throughout the visualized axial and appendicular skeleton, similar to the prior study, compatible with widespread metastatic disease. The largest of these is in the T11 vertebral body measuring 3.1 x 3.2 cm (axial image 127 of series 4). CT ABDOMEN PELVIS FINDINGS Hepatobiliary: Multiple new hypovascular hepatic lesions are noted, highly concerning for widespread metastatic disease. The largest of these lesions measures 2.1 x 1.5 cm in segment 7 of the liver (axial image 46 of series 2). No intra or extrahepatic biliary ductal dilatation. Status post cholecystectomy. Pancreas: No pancreatic mass. No pancreatic ductal dilatation. No pancreatic or peripancreatic fluid or inflammatory changes. Spleen:  Unremarkable. Adrenals/Urinary Tract: Bilateral kidneys and bilateral adrenal glands are normal in appearance. No hydroureteronephrosis. Urinary bladder is normal in appearance. Stomach/Bowel: Normal appearance of the stomach. No pathologic dilatation of small bowel or colon. A few scattered colonic diverticulae are noted, without surrounding inflammatory changes to suggest an acute diverticulitis at this time. Normal appendix. Vascular/Lymphatic: Aortic atherosclerosis, without evidence of aneurysm  or dissection in the abdominal or pelvic vasculature. No lymphadenopathy noted in the abdomen or pelvis. Reproductive: Prostate gland and seminal vesicles are unremarkable in appearance. Other: No significant volume of ascites.  No pneumoperitoneum. Musculoskeletal: Numerous predominantly sclerotic lesions are again noted throughout the visualized axial and appendicular skeleton, similar to the prior study, compatible with widespread metastatic disease to the bones, largest of which is in the medial aspect of the right ilium measuring 3.5 x 1.7 cm (axial image 98 of series 2). IMPRESSION: 1. Interval development of numerous hypovascular hepatic lesions highly concerning for new metastatic disease to the liver. 2. Widespread metastatic disease to the bones appear similar to the prior examination. 3. New trace bilateral pleural effusions lying dependently. 4. Aortic atherosclerosis. 5. Colonic diverticulosis without evidence of acute diverticulitis at this time. 6. Additional incidental findings, as above. Electronically Signed   By: Vinnie Langton M.D.   On: 08/21/2018 19:19   Ct Abdomen Pelvis W Contrast  Result Date: 08/21/2018 CLINICAL DATA:  57 year old male with extensive stage small cell lung cancer undergoing ongoing chemotherapy. Severe right-sided groin pain. Nausea and diaphoresis today. EXAM: CT CHEST, ABDOMEN, AND PELVIS WITH CONTRAST TECHNIQUE: Multidetector CT imaging of the chest, abdomen and pelvis  was performed following the standard protocol during bolus administration of intravenous contrast. CONTRAST:  150mL ISOVUE-300 IOPAMIDOL (ISOVUE-300) INJECTION 61% COMPARISON:  CT the chest, abdomen and pelvis 06/04/2018. FINDINGS: CT CHEST FINDINGS Cardiovascular: Heart size is normal. There is no significant pericardial fluid, thickening or pericardial calcification. Mild atherosclerosis in the thoracic aorta. No definite coronary artery calcifications identified. Right subclavian single-lumen porta cath with tip terminating in the distal superior vena cava. Mediastinum/Nodes: No pathologically enlarged mediastinal or hilar lymph nodes. Esophagus is unremarkable in appearance. No axillary lymphadenopathy. Lungs/Pleura: Stable nodules in the periphery of the left upper lobe measuring up to 5 mm (axial image 59 of series 4). No other new suspicious appearing pulmonary nodules or masses are noted. No acute consolidative airspace disease. Trace bilateral pleural effusions lying dependently. Musculoskeletal: Multiple sclerotic lesions are again noted throughout the visualized axial and appendicular skeleton, similar to the prior study, compatible with widespread metastatic disease. The largest of these is in the T11 vertebral body measuring 3.1 x 3.2 cm (axial image 127 of series 4). CT ABDOMEN PELVIS FINDINGS Hepatobiliary: Multiple new hypovascular hepatic lesions are noted, highly concerning for widespread metastatic disease. The largest of these lesions measures 2.1 x 1.5 cm in segment 7 of the liver (axial image 46 of series 2). No intra or extrahepatic biliary ductal dilatation. Status post cholecystectomy. Pancreas: No pancreatic mass. No pancreatic ductal dilatation. No pancreatic or peripancreatic fluid or inflammatory changes. Spleen: Unremarkable. Adrenals/Urinary Tract: Bilateral kidneys and bilateral adrenal glands are normal in appearance. No hydroureteronephrosis. Urinary bladder is normal in appearance.  Stomach/Bowel: Normal appearance of the stomach. No pathologic dilatation of small bowel or colon. A few scattered colonic diverticulae are noted, without surrounding inflammatory changes to suggest an acute diverticulitis at this time. Normal appendix. Vascular/Lymphatic: Aortic atherosclerosis, without evidence of aneurysm or dissection in the abdominal or pelvic vasculature. No lymphadenopathy noted in the abdomen or pelvis. Reproductive: Prostate gland and seminal vesicles are unremarkable in appearance. Other: No significant volume of ascites.  No pneumoperitoneum. Musculoskeletal: Numerous predominantly sclerotic lesions are again noted throughout the visualized axial and appendicular skeleton, similar to the prior study, compatible with widespread metastatic disease to the bones, largest of which is in the medial aspect of the right ilium measuring 3.5  x 1.7 cm (axial image 98 of series 2). IMPRESSION: 1. Interval development of numerous hypovascular hepatic lesions highly concerning for new metastatic disease to the liver. 2. Widespread metastatic disease to the bones appear similar to the prior examination. 3. New trace bilateral pleural effusions lying dependently. 4. Aortic atherosclerosis. 5. Colonic diverticulosis without evidence of acute diverticulitis at this time. 6. Additional incidental findings, as above. Electronically Signed   By: Vinnie Langton M.D.   On: 08/21/2018 19:19    Impression/Plan: 1. 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 We spent significant time discussing his ongoing symptoms related to his disease as well as his disease overall. He is hopeful to get back to systemic therapy. He is also interested in considering palliative care along the way. We also discussed that early involvement in hospice care could also be considered when and if he is ready. For now he plans to follow up at [facility] in a few weeks and  wants to focus on improving his symptoms. We would plan to follow him in brain oncology program and get an MRI of the brain in early February. He is in agreement and will be contacted to schedule this. He prefers to be seen to review results after it's discussed in conference. 2. Symptom management. It's difficult to dissect out all of his symptoms. I'm disappointed he hasn't had relief of his symptoms in this time frame since radiation. We discussed continuing his oxycodone for now, as well as adding neurontin. He is willing to try this. He does not appear to have any neurologic compromise to indicate issues with his cord but if he has sensory or motor function deficits he will let us know. Regarding his progressive nausea I think addition of Ativan (and holding Xanax) for shorter acting relief of nausea and anxiety may help. We discussed stopping scopolamine patch due his side effects. Regarding his dizziness, I think this could be issues of polypharmacy but also his left ear effusion. While he has HTN and we have to be careful of this, decongestants OTC such as sudafed may help to alleviate eustachian tube dysfunction and we discussed the risks, benefits, and side effect profiles of all these options. We also discussed he may want to consider long acting pain medication to reduce the breakthrough pain he's having, and he is going to think about this.  3. Code status. The patient would like to be DNR after further discussion, but was not ready to have me sign the forms. He was given two yellow DNR forms to read over and will likely ask Dr. Delton Coombes to fill these out. I also offered to fill them out and mail them to the patient. We discussed having two of them so he could have one while out an about, and one at home.       Carola Rhine, PAC

## 2018-09-07 ENCOUNTER — Encounter (HOSPITAL_COMMUNITY): Payer: Self-pay

## 2018-09-07 ENCOUNTER — Inpatient Hospital Stay (HOSPITAL_COMMUNITY): Payer: Medicare HMO

## 2018-09-07 ENCOUNTER — Other Ambulatory Visit: Payer: Self-pay

## 2018-09-07 ENCOUNTER — Other Ambulatory Visit: Payer: Self-pay | Admitting: Radiation Oncology

## 2018-09-07 DIAGNOSIS — C7951 Secondary malignant neoplasm of bone: Secondary | ICD-10-CM | POA: Diagnosis not present

## 2018-09-07 DIAGNOSIS — Z9225 Personal history of immunosupression therapy: Secondary | ICD-10-CM | POA: Diagnosis not present

## 2018-09-07 DIAGNOSIS — G893 Neoplasm related pain (acute) (chronic): Secondary | ICD-10-CM | POA: Diagnosis not present

## 2018-09-07 DIAGNOSIS — Z5112 Encounter for antineoplastic immunotherapy: Secondary | ICD-10-CM | POA: Diagnosis not present

## 2018-09-07 DIAGNOSIS — Z87891 Personal history of nicotine dependence: Secondary | ICD-10-CM | POA: Diagnosis not present

## 2018-09-07 DIAGNOSIS — C3492 Malignant neoplasm of unspecified part of left bronchus or lung: Secondary | ICD-10-CM | POA: Diagnosis not present

## 2018-09-07 DIAGNOSIS — B370 Candidal stomatitis: Secondary | ICD-10-CM | POA: Diagnosis not present

## 2018-09-07 DIAGNOSIS — R69 Illness, unspecified: Secondary | ICD-10-CM | POA: Diagnosis not present

## 2018-09-07 DIAGNOSIS — C787 Secondary malignant neoplasm of liver and intrahepatic bile duct: Secondary | ICD-10-CM | POA: Diagnosis not present

## 2018-09-07 DIAGNOSIS — Z79899 Other long term (current) drug therapy: Secondary | ICD-10-CM | POA: Diagnosis not present

## 2018-09-07 MED ORDER — SODIUM CHLORIDE 0.9% FLUSH
10.0000 mL | INTRAVENOUS | Status: DC | PRN
  Administered 2018-09-07: 10 mL via INTRAVENOUS
  Filled 2018-09-07: qty 10

## 2018-09-07 MED ORDER — GABAPENTIN 300 MG PO CAPS: 300.0000 mg | ORAL_CAPSULE | Freq: Three times a day (TID) | ORAL | 0 refills | Status: AC

## 2018-09-07 MED ORDER — LORAZEPAM 0.5 MG PO TABS: 0.5000 mg | ORAL_TABLET | Freq: Four times a day (QID) | ORAL | 0 refills | Status: AC | PRN

## 2018-09-07 NOTE — Progress Notes (Signed)
[name] [name] presented for Portacath access and flush.  Proper placement of portacath confirmed by CXR.  Portacath located right chest wall accessed with  H 20 needle.  Good blood return present. Portacath flushed with 70ml NS and 500U/11ml Heparin and needle removed intact.  Procedure tolerated well and without incident.  Discharged via wheelchair

## 2018-09-07 NOTE — Addendum Note (Signed)
Encounter addended by: Sherrlyn Hock, LPN on: 76/18/4859 2:76 PM  Actions taken: Order Reconciliation Section accessed

## 2018-09-10 DIAGNOSIS — E860 Dehydration: Secondary | ICD-10-CM | POA: Diagnosis not present

## 2018-09-10 DIAGNOSIS — C3490 Malignant neoplasm of unspecified part of unspecified bronchus or lung: Secondary | ICD-10-CM | POA: Diagnosis not present

## 2018-09-10 DIAGNOSIS — C7951 Secondary malignant neoplasm of bone: Secondary | ICD-10-CM | POA: Diagnosis not present

## 2018-09-13 DIAGNOSIS — J449 Chronic obstructive pulmonary disease, unspecified: Secondary | ICD-10-CM | POA: Diagnosis not present

## 2018-09-14 ENCOUNTER — Inpatient Hospital Stay (HOSPITAL_COMMUNITY): Payer: Medicare HMO | Attending: Hematology

## 2018-09-14 ENCOUNTER — Encounter (HOSPITAL_COMMUNITY): Payer: Self-pay

## 2018-09-14 DIAGNOSIS — Z87891 Personal history of nicotine dependence: Secondary | ICD-10-CM | POA: Insufficient documentation

## 2018-09-14 DIAGNOSIS — G893 Neoplasm related pain (acute) (chronic): Secondary | ICD-10-CM | POA: Insufficient documentation

## 2018-09-14 DIAGNOSIS — C3492 Malignant neoplasm of unspecified part of left bronchus or lung: Secondary | ICD-10-CM | POA: Diagnosis present

## 2018-09-14 DIAGNOSIS — Z79899 Other long term (current) drug therapy: Secondary | ICD-10-CM | POA: Insufficient documentation

## 2018-09-14 DIAGNOSIS — Z9225 Personal history of immunosupression therapy: Secondary | ICD-10-CM | POA: Insufficient documentation

## 2018-09-14 DIAGNOSIS — C787 Secondary malignant neoplasm of liver and intrahepatic bile duct: Secondary | ICD-10-CM | POA: Diagnosis not present

## 2018-09-14 DIAGNOSIS — C7951 Secondary malignant neoplasm of bone: Secondary | ICD-10-CM | POA: Insufficient documentation

## 2018-09-14 MED ORDER — HEPARIN SOD (PORK) LOCK FLUSH 100 UNIT/ML IV SOLN
500.0000 [IU] | Freq: Once | INTRAVENOUS | Status: AC
  Administered 2018-09-14: 500 [IU] via INTRAVENOUS

## 2018-09-14 MED ORDER — SODIUM CHLORIDE 0.9% FLUSH
10.0000 mL | INTRAVENOUS | Status: DC | PRN
  Administered 2018-09-14: 10 mL via INTRAVENOUS
  Filled 2018-09-14: qty 10

## 2018-09-14 NOTE — Progress Notes (Signed)
[name] [name] tolerated port needle and dressing change with flush well without complaints or incident. Port de-accessed and re-accessed with 20 gauge needle with blood return noted then flushed with 10 ml NS and 5 ml Heparin easily per protocol and saline locked. Pt and wife feel like the IV hydration he receives daily at home is really helping him and would like to continue this.He is still not eating or drinking much but the N+V are much improved. Francene Finders NP made aware and previous hydration orders continued for 30 days. [facility] pharmacy notified and verbalized understanding. Pt discharged via wheelchair in satisfactory condition accompanied by his wife

## 2018-09-14 NOTE — Patient Instructions (Signed)
[place] at [facility] Discharge Instructions  Portacath needle and dressing changed with portacath flushed per protocol. Follow-up as scheduled. Call clinic for any questions or concerns   Thank you for choosing [place] at [facility] to provide your oncology and hematology care.  To afford each patient quality time with our provider, please arrive at least 15 minutes before your scheduled appointment time.   If you have a lab appointment with the [facility] please come in thru the  Main Entrance and check in at the main information desk  You need to re-schedule your appointment should you arrive 10 or more minutes late.  We strive to give you quality time with our providers, and arriving late affects you and other patients whose appointments are after yours.  Also, if you no show three or more times for appointments you may be dismissed from the clinic at the providers discretion.     Again, thank you for choosing [facility].  Our hope is that these requests will decrease the amount of time that you wait before being seen by our physicians.       _____________________________________________________________  Should you have questions after your visit to [facility], please contact our office at [phone] between the hours of 8:00 a.m. and 4:30 p.m.  Voicemails left after 4:00 p.m. will not be returned until the following business day.  For prescription refill requests, have your pharmacy contact our office and allow 72 hours.    Cancer Center Support Programs:   > Cancer Support Group  2nd Tuesday of the month 1pm-2pm, Journey Room

## 2018-09-17 DIAGNOSIS — C3490 Malignant neoplasm of unspecified part of unspecified bronchus or lung: Secondary | ICD-10-CM | POA: Diagnosis not present

## 2018-09-17 DIAGNOSIS — C7951 Secondary malignant neoplasm of bone: Secondary | ICD-10-CM | POA: Diagnosis not present

## 2018-09-17 DIAGNOSIS — G4733 Obstructive sleep apnea (adult) (pediatric): Secondary | ICD-10-CM | POA: Diagnosis not present

## 2018-09-17 DIAGNOSIS — E860 Dehydration: Secondary | ICD-10-CM | POA: Diagnosis not present

## 2018-09-19 DIAGNOSIS — C3490 Malignant neoplasm of unspecified part of unspecified bronchus or lung: Secondary | ICD-10-CM | POA: Diagnosis not present

## 2018-09-19 DIAGNOSIS — J449 Chronic obstructive pulmonary disease, unspecified: Secondary | ICD-10-CM | POA: Diagnosis not present

## 2018-09-19 DIAGNOSIS — J301 Allergic rhinitis due to pollen: Secondary | ICD-10-CM | POA: Diagnosis not present

## 2018-09-19 DIAGNOSIS — C787 Secondary malignant neoplasm of liver and intrahepatic bile duct: Secondary | ICD-10-CM | POA: Diagnosis not present

## 2018-09-20 DIAGNOSIS — C787 Secondary malignant neoplasm of liver and intrahepatic bile duct: Secondary | ICD-10-CM | POA: Diagnosis not present

## 2018-09-20 DIAGNOSIS — C3490 Malignant neoplasm of unspecified part of unspecified bronchus or lung: Secondary | ICD-10-CM | POA: Diagnosis not present

## 2018-09-21 ENCOUNTER — Ambulatory Visit (HOSPITAL_COMMUNITY): Payer: Medicare HMO

## 2018-09-21 ENCOUNTER — Other Ambulatory Visit: Payer: Self-pay

## 2018-09-21 ENCOUNTER — Other Ambulatory Visit (HOSPITAL_COMMUNITY): Payer: Self-pay | Admitting: Nurse Practitioner

## 2018-09-21 ENCOUNTER — Inpatient Hospital Stay (HOSPITAL_COMMUNITY): Payer: Medicare HMO

## 2018-09-21 DIAGNOSIS — Z79899 Other long term (current) drug therapy: Secondary | ICD-10-CM | POA: Diagnosis not present

## 2018-09-21 DIAGNOSIS — Z9225 Personal history of immunosupression therapy: Secondary | ICD-10-CM | POA: Diagnosis not present

## 2018-09-21 DIAGNOSIS — C787 Secondary malignant neoplasm of liver and intrahepatic bile duct: Secondary | ICD-10-CM | POA: Diagnosis not present

## 2018-09-21 DIAGNOSIS — G893 Neoplasm related pain (acute) (chronic): Secondary | ICD-10-CM | POA: Diagnosis not present

## 2018-09-21 DIAGNOSIS — C7951 Secondary malignant neoplasm of bone: Secondary | ICD-10-CM | POA: Diagnosis not present

## 2018-09-21 DIAGNOSIS — C3492 Malignant neoplasm of unspecified part of left bronchus or lung: Secondary | ICD-10-CM | POA: Diagnosis not present

## 2018-09-21 DIAGNOSIS — M47812 Spondylosis without myelopathy or radiculopathy, cervical region: Secondary | ICD-10-CM | POA: Diagnosis not present

## 2018-09-21 DIAGNOSIS — Z87891 Personal history of nicotine dependence: Secondary | ICD-10-CM | POA: Diagnosis not present

## 2018-09-21 MED ORDER — PROMETHAZINE HCL 25 MG/ML IJ SOLN
25.0000 mg | Freq: Four times a day (QID) | INTRAMUSCULAR | Status: DC | PRN
  Administered 2018-09-21: 25 mg via INTRAVENOUS

## 2018-09-21 MED ORDER — HYDROMORPHONE HCL 1 MG/ML IJ SOLN
INTRAMUSCULAR | Status: AC
  Filled 2018-09-21: qty 1

## 2018-09-21 MED ORDER — PROMETHAZINE HCL 25 MG/ML IJ SOLN
INTRAMUSCULAR | Status: AC
  Filled 2018-09-21: qty 1

## 2018-09-21 MED ORDER — HYDROMORPHONE HCL 1 MG/ML IJ SOLN
1.0000 mg | Freq: Once | INTRAMUSCULAR | Status: AC
  Administered 2018-09-21: 1 mg via INTRAVENOUS

## 2018-09-21 MED ORDER — SODIUM CHLORIDE 0.9 % IV SOLN
INTRAVENOUS | Status: DC
  Administered 2018-09-21: 15:00:00 via INTRAVENOUS

## 2018-09-21 NOTE — Progress Notes (Signed)
[name] [name] tolerated hydration and pain and nausea medication well without complaints or incident. VSS upon discharge. Pt discharged via wheelchair in satisfactory condition accompanied by her wife

## 2018-09-21 NOTE — Progress Notes (Signed)
1415- patient here today to change port needle out.Pateint requesting to have fluids, pain medication and nausea medication today while he is here in the clinic. Patient states he has been out today at appointments and has not had anything to eat or drink much and has not had his IVF today that he receives at home. Dr. Delton Coombes notified and will given medications and fluids as ordered.

## 2018-09-24 ENCOUNTER — Inpatient Hospital Stay (HOSPITAL_BASED_OUTPATIENT_CLINIC_OR_DEPARTMENT_OTHER): Payer: Medicare HMO | Admitting: Hematology

## 2018-09-24 ENCOUNTER — Inpatient Hospital Stay (HOSPITAL_COMMUNITY): Payer: Medicare HMO | Attending: Hematology

## 2018-09-24 ENCOUNTER — Encounter (HOSPITAL_COMMUNITY): Payer: Self-pay | Admitting: Hematology

## 2018-09-24 DIAGNOSIS — Z87891 Personal history of nicotine dependence: Secondary | ICD-10-CM | POA: Diagnosis not present

## 2018-09-24 DIAGNOSIS — Z79899 Other long term (current) drug therapy: Secondary | ICD-10-CM | POA: Diagnosis not present

## 2018-09-24 DIAGNOSIS — G893 Neoplasm related pain (acute) (chronic): Secondary | ICD-10-CM | POA: Diagnosis not present

## 2018-09-24 DIAGNOSIS — C7951 Secondary malignant neoplasm of bone: Secondary | ICD-10-CM | POA: Insufficient documentation

## 2018-09-24 DIAGNOSIS — C3490 Malignant neoplasm of unspecified part of unspecified bronchus or lung: Secondary | ICD-10-CM

## 2018-09-24 DIAGNOSIS — C3492 Malignant neoplasm of unspecified part of left bronchus or lung: Secondary | ICD-10-CM | POA: Insufficient documentation

## 2018-09-24 DIAGNOSIS — C787 Secondary malignant neoplasm of liver and intrahepatic bile duct: Secondary | ICD-10-CM

## 2018-09-24 DIAGNOSIS — Z9225 Personal history of immunosupression therapy: Secondary | ICD-10-CM | POA: Insufficient documentation

## 2018-09-24 MED ORDER — PROMETHAZINE HCL 25 MG/ML IJ SOLN
INTRAMUSCULAR | Status: AC
  Filled 2018-09-24: qty 1

## 2018-09-24 MED ORDER — SODIUM CHLORIDE 0.9 % IV SOLN
INTRAVENOUS | Status: DC
  Administered 2018-09-24: 13:00:00 via INTRAVENOUS

## 2018-09-24 MED ORDER — PROMETHAZINE HCL 25 MG/ML IJ SOLN
12.5000 mg | Freq: Once | INTRAMUSCULAR | Status: AC
  Administered 2018-09-24: 12.5 mg via INTRAVENOUS

## 2018-09-24 MED ORDER — HYDROMORPHONE HCL 1 MG/ML IJ SOLN
1.0000 mg | Freq: Once | INTRAMUSCULAR | Status: AC
  Administered 2018-09-24: 1 mg via INTRAVENOUS

## 2018-09-24 MED ORDER — HYDROMORPHONE HCL 1 MG/ML IJ SOLN
INTRAMUSCULAR | Status: AC
  Filled 2018-09-24: qty 1

## 2018-09-24 NOTE — Patient Instructions (Signed)
[facility] at [facility] Discharge Instructions     Thank you for choosing [facility] at [facility] to provide your oncology and hematology care.  To afford each patient quality time with our provider, please arrive at least 15 minutes before your scheduled appointment time.   If you have a lab appointment with the Cancer Center please come in thru the  Main Entrance and check in at the main information desk  You need to re-schedule your appointment should you arrive 10 or more minutes late.  We strive to give you quality time with our providers, and arriving late affects you and other patients whose appointments are after yours.  Also, if you no show three or more times for appointments you may be dismissed from the clinic at the providers discretion.     Again, thank you for choosing [facility].  Our hope is that these requests will decrease the amount of time that you wait before being seen by our physicians.       _____________________________________________________________  Should you have questions after your visit to [facility], please contact our office at [phone] between the hours of 8:00 a.m. and 4:30 p.m.  Voicemails left after 4:00 p.m. will not be returned until the following business day.  For prescription refill requests, have your pharmacy contact our office and allow 72 hours.    Cancer Center Support Programs:   > Cancer Support Group  2nd Tuesday of the month 1pm-2pm, Journey Room    

## 2018-09-24 NOTE — Progress Notes (Signed)
Tolerated infusion and injections w/o adverse reaction.  Denies nausea and reports pain improved.  Discharged via wheelchair in c/o spouse.

## 2018-09-24 NOTE — Progress Notes (Signed)
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     06/15/2018 -  Chemotherapy    The patient had ipilimumab (YERVOY) 90 mg in sodium chloride 0.9 % 50 mL chemo infusion, 1 mg/kg = 90 mg, Intravenous,  Once, 3 of 4 cycles Administration: 90 mg (06/22/2018), 90 mg (07/13/2018), 85 mg (08/03/2018) nivolumab (OPDIVO) 270 mg in sodium chloride 0.9 % 100 mL chemo infusion, 3 mg/kg = 270 mg, Intravenous, Once, 3 of 8 cycles Administration: 270 mg (06/22/2018), 240 mg (07/13/2018), 240 mg (08/03/2018)  for chemotherapy treatment.       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 male returns for routine follow-up for small cell lung cancer. He is here today with his wife. He reports that his appetite has improved a little. He is still having nausea and dry heaves. He is walking with a cane to get around his house. If he has to go  long distances he needs a wheelchair. He does have constipation due to the pain medication and the nausea medication. Denies any diarrhea. Denies any new pains. Had not noticed any recent bleeding such as epistaxis, hematuria or hematochezia. Denies recent chest pain on exertion, shortness of breath on minimal exertion, pre-syncopal episodes, or palpitations. Denies any numbness or tingling in hands or feet. Denies any recent fevers, infections, or recent hospitalizations. He reports his appetite at 25% and he has no energy .     REVIEW OF SYSTEMS:  Review of Systems  Constitutional: Positive for fatigue.  Gastrointestinal: Positive for constipation, nausea and vomiting.  All other systems reviewed and are negative.    PAST MEDICAL/SURGICAL HISTORY:  Past Medical History:  Diagnosis Date  . Anxiety   . Asthma   . COPD (chronic obstructive pulmonary disease) ([place])   . DDD (degenerative disc disease)   . Diverticulitis   . GERD (gastroesophageal reflux disease)   . Headache   . Hepatitis    in the late 70's   . HTN (hypertension)   . Mediastinal adenopathy   . Seizures ([place])    as of 2019 none for 20 years  . Sleep apnea    does not  use cpap  . Stroke [facility])    TIA - Feb. 2018   Past Surgical History:  Procedure Laterality Date  . CERVICAL DISC SURGERY     X4  . CHOLECYSTECTOMY    . COLONOSCOPY  08/24/2011   Procedure: COLONOSCOPY;  Surgeon: Cristopher Estimable  Rourk, MD;  Location: [facility] ENDO SUITE;  Service: Endoscopy;  Laterality: N/A;  7:30AM  . ELBOW SURGERY     right  . PORTACATH PLACEMENT Right 12/01/2017   Procedure: INSERTION PORT-A-CATH;  Surgeon: Aviva Signs, MD;  Location: [facility] ORS;  Service: General;  Laterality: Right;  Marland Kitchen VIDEO BRONCHOSCOPY WITH ENDOBRONCHIAL ULTRASOUND N/A 10/31/2017   Procedure: VIDEO BRONCHOSCOPY WITH ENDOBRONCHIAL ULTRASOUND;  Surgeon: Ivin Poot, MD;  Location: [facility];  Service: Thoracic;  Laterality: N/A;  . VIDEO BRONCHOSCOPY WITH ENDOBRONCHIAL  ULTRASOUND N/A 11/22/2017   Procedure: VIDEO BRONCHOSCOPY WITH ENDOBRONCHIAL ULTRASOUND;  Surgeon: Melrose Nakayama, MD;  Location: [facility];  Service: Thoracic;  Laterality: N/A;     SOCIAL HISTORY:  Social History   Socioeconomic History  . Marital status: Married    Spouse name: Not on file  . Number of children: Not on file  . Years of education: Not on file  . Highest education level: Not on file  Occupational History  . Occupation: disability  Social Needs  . Financial resource strain: Not on file  . Food insecurity:    Worry: Not on file    Inability: Not on file  . Transportation needs:    Medical: No    Non-medical: No  Tobacco Use  . Smoking status: Former Smoker    Packs/day: 1.50    Years: 30.00    Pack years: 45.00    Types: Cigarettes    Last attempt to quit: 10/13/2017    Years since quitting: 0.9  . Smokeless tobacco: Never Used  Substance and Sexual Activity  . Alcohol use: No  . Drug use: No  . Sexual activity: Yes    Birth control/protection: None  Lifestyle  . Physical activity:    Days per week: Not on file    Minutes per session: Not on file  . Stress: Not on file  Relationships  . Social connections:    Talks on phone: Not on file    Gets together: Not on file    Attends religious service: Not on file    Active member of club or organization: Not on file    Attends meetings of clubs or organizations: Not on file    Relationship status: Not on file  . Intimate partner violence:    Fear of current or ex partner: Not on file    Emotionally abused: Not on file    Physically abused: Not on file    Forced sexual activity: Not on file  Other Topics Concern  . Not on file  Social History Narrative   06-20-18 Unable to ask abuse questions wife with him today.   07-25-18  Unable to ask abuse questions wife with him today.    FAMILY HISTORY:  Family History  Problem Relation Age of Onset  . Hypertension Father   . Heart attack Father   .  Kidney failure Mother   . Diabetes Mother   . Hypertension Mother   . Colon cancer Neg Hx   . Anesthesia problems Neg Hx   . Hypotension Neg Hx   . Malignant hyperthermia Neg Hx   . Pseudochol deficiency Neg Hx     CURRENT MEDICATIONS:  Outpatient Encounter Medications as of 09/24/2018  Medication Sig Note  . albuterol (PROVENTIL HFA;VENTOLIN HFA) 108 (90 BASE) MCG/ACT inhaler Inhale 2 puffs into the lungs every 6 (six) hours as needed for wheezing or shortness of breath.    Marland Kitchen albuterol (PROVENTIL) (2.5 MG/3ML) 0.083% nebulizer solution  Take 2.5 mg by nebulization every 6 (six) hours as needed for wheezing or shortness of breath.    . alprazolam (XANAX) 2 MG tablet Take 2 mg by mouth every 6 (six) hours as needed for sleep or anxiety.  07/20/2018: Takes a 1/4 of a tablet to help sleep to prevent next day drowsiness.   . Calcium-Magnesium-Zinc 333-133-5 MG TABS Take 3 tablets by mouth daily.    . cyclobenzaprine (FLEXERIL) 10 MG tablet Take 10 mg by mouth daily as needed for muscle spasms.    . diclofenac sodium (VOLTAREN) 1 % GEL Apply 2 g topically 4 (four) times daily. Apply to left forearm phlebitic area   . docusate sodium (COLACE) 100 MG capsule Take 100 mg by mouth 2 (two) times daily.   Marland Kitchen dronabinol (MARINOL) 2.5 MG capsule Take 1 capsule (2.5 mg total) by mouth 2 (two) times daily before lunch and supper.   . Flaxseed, Linseed, (FLAXSEED OIL PO) Take 1-2 capsules by mouth daily.   . fluticasone (FLONASE) 50 MCG/ACT nasal spray Place 2 sprays into both nostrils daily as needed for allergies.    Marland Kitchen levETIRAcetam (KEPPRA) 500 MG tablet Take 500 mg by mouth every evening.    . lidocaine-prilocaine (EMLA) cream APPLY TO PORTACATH SITE AS DIRECTED (Patient taking differently: Apply 1 application topically once. )   . lisinopril (PRINIVIL,ZESTRIL) 20 MG tablet Take 20 mg by mouth daily.    . MULTIPLE VITAMINS PO Take 1 tablet by mouth daily.    . naproxen (NAPROSYN) 500 MG tablet Take 500 mg  by mouth at bedtime.    Marland Kitchen oxyCODONE (ROXICODONE) 15 MG immediate release tablet Take 15 mg by mouth 4 (four) times daily as needed for pain.    Marland Kitchen oxycodone (ROXICODONE) 30 MG immediate release tablet Take 30 mg by mouth every 4 (four) hours as needed for pain.   . polyethylene glycol powder (GLYCOLAX/MIRALAX) powder Take 17 g by mouth daily as needed for moderate constipation.   . promethazine (PHENERGAN) 25 MG tablet Take 1 tablet (25 mg total) by mouth every 6 (six) hours as needed for nausea or vomiting.   . tamsulosin (FLOMAX) 0.4 MG CAPS capsule Take 0.4 mg by mouth every evening.    . temazepam (RESTORIL) 30 MG capsule Take 1 capsule (30 mg total) by mouth at bedtime as needed for sleep.   Marland Kitchen triamcinolone cream (KENALOG) 0.1 % Apply 1 application topically 2 (two) times daily.    . TURMERIC PO Take 1 capsule by mouth daily.   . [DISCONTINUED] Absorbable Collagen Hemostat (ACTIFOAM COLLAGEN SPONGE) MISC by Misc.(Non-Drug; Combo Route) route.   . [DISCONTINUED] fluconazole (DIFLUCAN) 100 MG tablet Take 1 tablet (100 mg total) by mouth daily.   . [DISCONTINUED] fluconazole (DIFLUCAN) 100 MG tablet Take 1 tablet (100 mg total) by mouth daily. Take 2 pills the first day   . [DISCONTINUED] meclizine (ANTIVERT) 12.5 MG tablet Take 1 tablet (12.5 mg total) by mouth 3 (three) times daily as needed for dizziness.   . [DISCONTINUED] pantoprazole (PROTONIX) 40 MG tablet Take 40 mg by mouth at bedtime.    . gabapentin (NEURONTIN) 300 MG capsule Take 1 capsule (300 mg total) by mouth 3 (three) times daily. (Patient not taking: Reported on 09/24/2018)   . lidocaine (LIDODERM) 5 % Place 1 patch onto the skin daily. Remove & Discard patch within 12 hours or as directed by MD (Patient not taking: Reported on 09/24/2018)   . lidocaine (XYLOCAINE) 2 % solution Use as  directed 15 mLs in the mouth or throat every 4 (four) hours as needed for mouth pain. (Patient not taking: Reported on 09/24/2018)   . LORazepam  (ATIVAN) 0.5 MG tablet Take 1-2 tablets (0.5-1 mg total) by mouth every 6 (six) hours as needed for anxiety (or nausea). (Patient not taking: Reported on 09/24/2018)   . scopolamine (TRANSDERM-SCOP) 1 MG/3DAYS Place 1 patch (1.5 mg total) onto the skin every 3 (three) days. (Patient not taking: Reported on 09/24/2018)   . sucralfate (CARAFATE) 1 g tablet Take 1 tablet (1 g total) by mouth 4 (four) times daily. (Patient not taking: Reported on 09/24/2018) 07/20/2018: Takes two hours before and two hours after other medications.    Facility-Administered Encounter Medications as of 09/24/2018  Medication  . 0.9 %  sodium chloride infusion  . morphine 2 MG/ML injection 2 mg    ALLERGIES:  Allergies  Allergen Reactions  . Tecentriq [Atezolizumab] Rash  . Xgeva [Denosumab] Rash     PHYSICAL EXAM:  ECOG Performance status: 2  Vitals:   09/24/18 1100  BP: 109/72  Pulse: (!) 123  Resp: 20  Temp: 98.3 F (36.8 C)  SpO2: 97%   Filed Weights   09/24/18 1100  Weight: 151 lb 9 oz (68.7 kg)    Physical Exam Constitutional:      Appearance: He is ill-appearing.  Musculoskeletal:     Comments: Walks with cane or wheelchair  Skin:    General: Skin is warm and dry.  Neurological:     Mental Status: He is alert and oriented to person, place, and time. Mental status is at baseline.  Psychiatric:        Mood and Affect: Mood normal.        Behavior: Behavior normal.        Thought Content: Thought content normal.        Judgment: Judgment normal.      LABORATORY DATA:  I have reviewed the labs as listed.  CBC    Component Value Date/Time   WBC 4.6 08/30/2018 1036   RBC 3.69 (L) 08/30/2018 1036   HGB 10.3 (L) 08/30/2018 1036   HCT 34.4 (L) 08/30/2018 1036   PLT 153 08/30/2018 1036   MCV 93.2 08/30/2018 1036   MCH 27.9 08/30/2018 1036   MCHC 29.9 (L) 08/30/2018 1036   RDW 16.1 (H) 08/30/2018 1036   LYMPHSABS 0.4 (L) 08/30/2018 1036   MONOABS 0.4 08/30/2018 1036   EOSABS 0.3  08/30/2018 1036   BASOSABS 0.0 08/30/2018 1036   CMP Latest Ref Rng & Units 08/30/2018 08/05/2018 07/30/2018  Glucose 70 - 99 mg/dL 169(H) 122(H) 97  BUN 6 - 20 mg/dL 13 14 10   Creatinine 0.61 - 1.24 mg/dL 0.90 1.02 1.01  Sodium 135 - 145 mmol/L 136 137 140  Potassium 3.5 - 5.1 mmol/L 3.8 4.3 3.2(L)  Chloride 98 - 111 mmol/L 103 103 109  CO2 22 - 32 mmol/L 24 25 22   Calcium 8.9 - 10.3 mg/dL 9.2 9.6 8.3(L)  Total Protein 6.5 - 8.1 g/dL 7.2 7.5 6.7  Total Bilirubin 0.3 - 1.2 mg/dL 0.6 0.7 0.8  Alkaline Phos 38 - 126 U/L 91 53 51  AST 15 - 41 U/L 43(H) 35 30  ALT 0 - 44 U/L 39 18 31       DIAGNOSTIC IMAGING:  I have independently reviewed the scans and discussed with the patient.   I have reviewed Francene Finders, NP's note and agree with the documentation.  I personally  performed a face-to-face visit, made revisions and my assessment and plan is as follows.    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  Extensive stage small cell lung cancer: -4 cycles of carboplatin, etoposide and Atezolizumab from 11/27/2017 through 01/29/2018 -CT scan of the chest, abdomen after 3 cycles of chemo immunotherapy on 01/25/2018 shows near complete resolution of the adenopathy and lung mass.  Sclerosis and bone mets noted.  - Maintenance Atezolizumab started on 02/28/2018, second dose on 03/22/2018, each time resulting in severe urticarial rash on the upper trunk and lower extremities, improved on steroid therapy. -He has gotten severe urticarial rash from Atezolizumab.  I have discussed the results of the CT scan of the chest, abdomen and pelvis dated 04/06/2018 which showed no evidence of disease progression.  Previously measured mediastinal lymph nodes are barely detectable.  Dominant left hilar lymph node measures 2.1 x 0.9 cm.  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  Mild T11 compression fracture with less than 10% height loss was also seen. -  I have sent him for a second opinion with Dr. Elmyra Ricks at [facility]. -He was evaluated by Dr. Berta Minor on 04/19/2018.  He was also referred to [facility] dermatology Dr. Royanne Foots. -It was suggested to give another trial of Atezolizumab.  He discontinued dexamethasone 4 mg daily and started taking prednisone 10 mg daily.  He was also prescribed triamcinolone 0.1% cream to be used twice daily. - He developed breathing difficulty with shortness of breath on exertion and on and off chest pain for the last 3 weeks.  He reportedly went to the ER on 06/04/2018 with a did a CT scan of the chest PE protocol and CT of the abdomen and pelvis. - I have reviewed the images of the CT scan independently and discussed the results with the patient.  It showed progression of the left hilar adenopathy compared to the prior exam from 2 months ago.  This currently measures about 2.3 x 4 cm in size.  Subcarinal lymph node also increased in size compared to prior exam.  No new areas were seen. - Atezolizumab discontinued after last dose on 05/18/2018 secondary to progression. - He was evaluated by Dr. Elmyra Ricks at [facility].  Combination immunotherapy with ipilimumab and nivolumab was recommended, as responses with chemotherapy is very low as his cancer has progressed within 3 months of platinum induction regimen. - Cycle 1 of combination immunotherapy was given on 06/22/2018.  Patient has been having low-grade fevers at nighttime.  He was evaluated in the ER on 07/03/2018 and all the work-up was negative.  Blood cultures did not show any growth. - MRI of the brain on 06/28/2018 showed 5 new small round enhancing brain lesions compatible with metastasis.  He finished whole brain radiation therapy on 07/13/2018. - Cycle 2 of immunotherapy on 07/13/2018. -He was admitted to the hospital from 07/20/2018 through 07/25/2018 with malignant otitis externa.  He received vancomycin and Zosyn while  inpatient and is currently receiving cefepime which will finish today. - Cycle 3 immunotherapy on 08/03/2018. - CT CAP dated 08/21/2018 shows interval development of numerous hypervascular hepatic lesions consistent with metastatic disease.  Largest lesion measures 2 cm.  Widespread bone metastatic disease is stable.  -he does have back pain which radiates laterally, predominantly on the left side.  He is currently taking oxycodone 15 mg alternating with 30 mg. - At last visit on 08/22/2018, I  did not consider him as a good candidate for any chemotherapy due to his declining performance status.  He wanted to wait 1 to 2 weeks and see if he improves. - He is receiving IV fluids with 1 L normal saline every day.  Yesterday he did not receive any fluids because he felt better and was eating better. - He is taking Zofran, Compazine and Phenergan around-the-clock for nausea.  He does have dry heaves. -He was evaluated by Dr. Elmyra Ricks at [facility] on 09/20/2018. -I have reiterated the fact that he is not a candidate for treatment.  The response rates in this setting are usually less than 5%. -We talked at length about hospice.  Patient refused hospice at this time.  He reports that he wants to get better. -He will continue fluids at home.  I am not offering any palliative chemotherapy at this point, as I do not believe he is a candidate.  2. Bone metastasis: - Zometa was held as he had tooth pulled on 07/31/2018.  Because of his progressive disease, we have discontinued Zometa.       Orders placed this encounter:  Orders Placed This Encounter  Procedures  . Lactate dehydrogenase  . CBC with Differential/Platelet  . Comprehensive metabolic panel  . TSH      Derek Jack, [place] [phone]

## 2018-09-24 NOTE — Assessment & Plan Note (Signed)
1.  Extensive stage small cell lung cancer: -4 cycles of carboplatin, etoposide and Atezolizumab from 11/27/2017 through 01/29/2018 -CT scan of the chest, abdomen after 3 cycles of chemo immunotherapy on 01/25/2018 shows near complete resolution of the adenopathy and lung mass.  Sclerosis and bone mets noted.  - Maintenance Atezolizumab started on 02/28/2018, second dose on 03/22/2018, each time resulting in severe urticarial rash on the upper trunk and lower extremities, improved on steroid therapy. -He has gotten severe urticarial rash from Atezolizumab.  I have discussed the results of the CT scan of the chest, abdomen and pelvis dated 04/06/2018 which showed no evidence of disease progression.  Previously measured mediastinal lymph nodes are barely detectable.  Dominant left hilar lymph node measures 2.1 x 0.9 cm.  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  Mild T11 compression fracture with less than 10% height loss was also seen. - I have sent him for a second opinion with Dr. Elmyra Ricks at [facility]. -He was evaluated by Dr. Berta Minor on 04/19/2018.  He was also referred to [facility] dermatology Dr. Royanne Foots. -It was suggested to give another trial of Atezolizumab.  He discontinued dexamethasone 4 mg daily and started taking prednisone 10 mg daily.  He was also prescribed triamcinolone 0.1% cream to be used twice daily. - He developed breathing difficulty with shortness of breath on exertion and on and off chest pain for the last 3 weeks.  He reportedly went to the ER on 06/04/2018 with a did a CT scan of the chest PE protocol and CT of the abdomen and pelvis. - I have reviewed the images of the CT scan independently and discussed the results with the patient.  It showed progression of the left hilar adenopathy compared to the prior exam from 2 months ago.  This currently measures about 2.3 x 4 cm in size.  Subcarinal lymph node also increased in size compared to prior exam.   No new areas were seen. - Atezolizumab discontinued after last dose on 05/18/2018 secondary to progression. - He was evaluated by Dr. Elmyra Ricks at [facility].  Combination immunotherapy with ipilimumab and nivolumab was recommended, as responses with chemotherapy is very low as his cancer has progressed within 3 months of platinum induction regimen. - Cycle 1 of combination immunotherapy was given on 06/22/2018.  Patient has been having low-grade fevers at nighttime.  He was evaluated in the ER on 07/03/2018 and all the work-up was negative.  Blood cultures did not show any growth. - MRI of the brain on 06/28/2018 showed 5 new small round enhancing brain lesions compatible with metastasis.  He finished whole brain radiation therapy on 07/13/2018. - Cycle 2 of immunotherapy on 07/13/2018. -He was admitted to the hospital from 07/20/2018 through 07/25/2018 with malignant otitis externa.  He received vancomycin and Zosyn while inpatient and is currently receiving cefepime which will finish today. - Cycle 3 immunotherapy on 08/03/2018. - CT CAP dated 08/21/2018 shows interval development of numerous hypervascular hepatic lesions consistent with metastatic disease.  Largest lesion measures 2 cm.  Widespread bone metastatic disease is stable.  -he does have back pain which radiates laterally, predominantly on the left side.  He is currently taking oxycodone 15 mg alternating with 30 mg. - At last visit on 08/22/2018, I did not consider him as a good candidate for any chemotherapy due to his declining performance status.  He wanted to wait 1 to 2 weeks and see if he improves. -  He is receiving IV fluids with 1 L normal saline every day.  Yesterday he did not receive any fluids because he felt better and was eating better. - He is taking Zofran, Compazine and Phenergan around-the-clock for nausea.  He does have dry heaves. -He was evaluated by Dr. Elmyra Ricks at [facility] on 09/20/2018. -I have  reiterated the fact that he is not a candidate for treatment.  The response rates in this setting are usually less than 5%. -We talked at length about hospice.  Patient refused hospice at this time.  He reports that he wants to get better. -He will continue fluids at home.  I am not offering any palliative chemotherapy at this point, as I do not believe he is a candidate.  2. Bone metastasis: - Zometa was held as he had tooth pulled on 07/31/2018.  Because of his progressive disease, we have discontinued Zometa.

## 2018-09-25 DIAGNOSIS — C3490 Malignant neoplasm of unspecified part of unspecified bronchus or lung: Secondary | ICD-10-CM | POA: Diagnosis not present

## 2018-09-25 DIAGNOSIS — E860 Dehydration: Secondary | ICD-10-CM | POA: Diagnosis not present

## 2018-09-25 DIAGNOSIS — C7951 Secondary malignant neoplasm of bone: Secondary | ICD-10-CM | POA: Diagnosis not present

## 2018-09-28 ENCOUNTER — Inpatient Hospital Stay (HOSPITAL_COMMUNITY): Payer: Medicare HMO

## 2018-09-28 ENCOUNTER — Encounter (HOSPITAL_COMMUNITY): Payer: Self-pay

## 2018-09-28 ENCOUNTER — Other Ambulatory Visit: Payer: Self-pay | Admitting: *Deleted

## 2018-09-28 DIAGNOSIS — Z79899 Other long term (current) drug therapy: Secondary | ICD-10-CM | POA: Diagnosis not present

## 2018-09-28 DIAGNOSIS — Z87891 Personal history of nicotine dependence: Secondary | ICD-10-CM | POA: Diagnosis not present

## 2018-09-28 DIAGNOSIS — C3492 Malignant neoplasm of unspecified part of left bronchus or lung: Secondary | ICD-10-CM | POA: Diagnosis not present

## 2018-09-28 DIAGNOSIS — D4989 Neoplasm of unspecified behavior of other specified sites: Secondary | ICD-10-CM

## 2018-09-28 DIAGNOSIS — G893 Neoplasm related pain (acute) (chronic): Secondary | ICD-10-CM | POA: Diagnosis not present

## 2018-09-28 DIAGNOSIS — C7951 Secondary malignant neoplasm of bone: Secondary | ICD-10-CM | POA: Diagnosis not present

## 2018-09-28 DIAGNOSIS — C787 Secondary malignant neoplasm of liver and intrahepatic bile duct: Secondary | ICD-10-CM | POA: Diagnosis not present

## 2018-09-28 DIAGNOSIS — Z9225 Personal history of immunosupression therapy: Secondary | ICD-10-CM | POA: Diagnosis not present

## 2018-09-28 MED ORDER — PROMETHAZINE HCL 25 MG/ML IJ SOLN
25.0000 mg | Freq: Once | INTRAMUSCULAR | Status: AC
  Administered 2018-09-28: 25 mg via INTRAVENOUS
  Filled 2018-09-28: qty 1

## 2018-09-28 MED ORDER — HYDROMORPHONE HCL 1 MG/ML IJ SOLN
INTRAMUSCULAR | Status: AC
  Filled 2018-09-28: qty 1

## 2018-09-28 MED ORDER — SODIUM CHLORIDE 0.9 % IV SOLN
INTRAVENOUS | Status: AC
  Administered 2018-09-28: 14:00:00 via INTRAVENOUS

## 2018-09-28 MED ORDER — HEPARIN SOD (PORK) LOCK FLUSH 100 UNIT/ML IV SOLN
500.0000 [IU] | Freq: Once | INTRAVENOUS | Status: AC
  Administered 2018-09-28: 500 [IU] via INTRAVENOUS
  Filled 2018-09-28: qty 5

## 2018-09-28 MED ORDER — HYDROMORPHONE HCL 1 MG/ML IJ SOLN
1.0000 mg | Freq: Once | INTRAMUSCULAR | Status: AC
  Administered 2018-09-28: 1 mg via INTRAVENOUS
  Filled 2018-09-28: qty 1

## 2018-09-28 MED ORDER — HYDROMORPHONE HCL 1 MG/ML IJ SOLN
1.0000 mg | Freq: Once | INTRAMUSCULAR | Status: AC
  Administered 2018-09-28: 1 mg via INTRAVENOUS

## 2018-09-28 MED ORDER — SODIUM CHLORIDE 0.9% FLUSH
10.0000 mL | INTRAVENOUS | Status: DC | PRN
  Administered 2018-09-28: 10 mL via INTRAVENOUS
  Filled 2018-09-28: qty 10

## 2018-09-28 NOTE — Patient Instructions (Signed)
[place] at [facility] Discharge Instructions  Received hydration with antiemetic and pain medication given as well. Follow-up as scheduled. Call clinic for any questions or concerns   Thank you for choosing [place] at [facility] to provide your oncology and hematology care.  To afford each patient quality time with our provider, please arrive at least 15 minutes before your scheduled appointment time.   If you have a lab appointment with the [facility] please come in thru the  Main Entrance and check in at the main information desk  You need to re-schedule your appointment should you arrive 10 or more minutes late.  We strive to give you quality time with our providers, and arriving late affects you and other patients whose appointments are after yours.  Also, if you no show three or more times for appointments you may be dismissed from the clinic at the providers discretion.     Again, thank you for choosing [facility].  Our hope is that these requests will decrease the amount of time that you wait before being seen by our physicians.       _____________________________________________________________  Should you have questions after your visit to [facility], please contact our office at [phone] between the hours of 8:00 a.m. and 4:30 p.m.  Voicemails left after 4:00 p.m. will not be returned until the following business day.  For prescription refill requests, have your pharmacy contact our office and allow 72 hours.    Cancer Center Support Programs:   > Cancer Support Group  2nd Tuesday of the month 1pm-2pm, Journey Room

## 2018-09-28 NOTE — Progress Notes (Signed)
[name] [name] tolerated hydration and antiemetic and pain medication well without further complaints . VSS upon discharge. Pt discharged via wheelchair in satisfactory condition accompanied by his wife

## 2018-10-05 ENCOUNTER — Encounter (HOSPITAL_COMMUNITY): Payer: Self-pay

## 2018-10-05 ENCOUNTER — Inpatient Hospital Stay (HOSPITAL_COMMUNITY): Payer: Medicare HMO

## 2018-10-05 ENCOUNTER — Ambulatory Visit (HOSPITAL_COMMUNITY): Payer: Medicare HMO

## 2018-10-05 DIAGNOSIS — Z9225 Personal history of immunosupression therapy: Secondary | ICD-10-CM | POA: Diagnosis not present

## 2018-10-05 DIAGNOSIS — C787 Secondary malignant neoplasm of liver and intrahepatic bile duct: Secondary | ICD-10-CM | POA: Diagnosis not present

## 2018-10-05 DIAGNOSIS — C3492 Malignant neoplasm of unspecified part of left bronchus or lung: Secondary | ICD-10-CM | POA: Diagnosis not present

## 2018-10-05 DIAGNOSIS — Z87891 Personal history of nicotine dependence: Secondary | ICD-10-CM | POA: Diagnosis not present

## 2018-10-05 DIAGNOSIS — G893 Neoplasm related pain (acute) (chronic): Secondary | ICD-10-CM | POA: Diagnosis not present

## 2018-10-05 DIAGNOSIS — C7951 Secondary malignant neoplasm of bone: Secondary | ICD-10-CM | POA: Diagnosis not present

## 2018-10-05 DIAGNOSIS — C3490 Malignant neoplasm of unspecified part of unspecified bronchus or lung: Secondary | ICD-10-CM

## 2018-10-05 DIAGNOSIS — Z79899 Other long term (current) drug therapy: Secondary | ICD-10-CM | POA: Diagnosis not present

## 2018-10-05 MED ORDER — HEPARIN SOD (PORK) LOCK FLUSH 100 UNIT/ML IV SOLN: 500.0000 [IU] | Freq: Once | INTRAVENOUS | Status: DC

## 2018-10-05 MED ORDER — SODIUM CHLORIDE 0.9% FLUSH
10.0000 mL | Freq: Once | INTRAVENOUS | Status: AC
  Administered 2018-10-05: 10 mL via INTRAVENOUS

## 2018-10-05 NOTE — Patient Instructions (Signed)
[facility]  Discharge Instructions:   _______________________________________________________________  Thank you for choosing [facility] at [facility] to provide your oncology and hematology care.  To afford each patient quality time with our providers, please arrive at least 15 minutes before your scheduled appointment.  You need to re-schedule your appointment if you arrive 10 or more minutes late.  We strive to give you quality time with our providers, and arriving late affects you and other patients whose appointments are after yours.  Also, if you no show three or more times for appointments you may be dismissed from the clinic.  Again, thank you for choosing [facility]. Our hope is that these requests will allow you access to exceptional care and in a timely manner. _______________________________________________________________  If you have questions after your visit, please contact our office at [phone] between the hours of 8:30 a.m. and 5:00 p.m. Voicemails left after 4:30 p.m. will not be returned until the following business day. _______________________________________________________________  For prescription refill requests, have your pharmacy contact our office. _______________________________________________________________  Recommendations made by the consultant and any test results will be sent to your referring physician. _______________________________________________________________ 

## 2018-10-08 DIAGNOSIS — R630 Anorexia: Secondary | ICD-10-CM | POA: Diagnosis not present

## 2018-10-08 DIAGNOSIS — M353 Polymyalgia rheumatica: Secondary | ICD-10-CM | POA: Diagnosis not present

## 2018-10-08 DIAGNOSIS — Z6821 Body mass index (BMI) 21.0-21.9, adult: Secondary | ICD-10-CM | POA: Diagnosis not present

## 2018-10-08 DIAGNOSIS — G894 Chronic pain syndrome: Secondary | ICD-10-CM | POA: Diagnosis not present

## 2018-10-12 ENCOUNTER — Encounter (HOSPITAL_COMMUNITY): Payer: Self-pay

## 2018-10-12 ENCOUNTER — Ambulatory Visit (HOSPITAL_COMMUNITY): Payer: Medicare HMO

## 2018-10-12 ENCOUNTER — Inpatient Hospital Stay (HOSPITAL_COMMUNITY): Payer: Medicare HMO

## 2018-10-12 NOTE — Progress Notes (Signed)
Kallie Edward Servidio presented for Portacath access and flush and needle change. Portacath located in the right chest wall accessed with  H 20 needle. Clean, Dry and Intact Good blood return present. Portacath flushed with 42ml NS and 500U/48ml Heparin per protocol and needle removed intact. Procedure without incident. Patient tolerated procedure well.

## 2018-10-14 DIAGNOSIS — J449 Chronic obstructive pulmonary disease, unspecified: Secondary | ICD-10-CM | POA: Diagnosis not present

## 2018-10-16 ENCOUNTER — Other Ambulatory Visit (HOSPITAL_COMMUNITY): Payer: Self-pay | Admitting: *Deleted

## 2018-10-16 DIAGNOSIS — C3490 Malignant neoplasm of unspecified part of unspecified bronchus or lung: Secondary | ICD-10-CM

## 2018-10-16 NOTE — Progress Notes (Signed)
Patient requested due to his weakness and inability to get out safely and without difficulty in breathing, that we get his already established home health agency to resume care of his port in addition to the fluids he is already receiving.  Orders have been placed and [facility] aware.

## 2018-10-17 ENCOUNTER — Telehealth (HOSPITAL_COMMUNITY): Payer: Self-pay | Admitting: Hematology

## 2018-10-17 NOTE — Telephone Encounter (Signed)
Per Cassandra at [facility] [place], patient's wife, [name], called and requested their services.  Referral / order / patient records faxed to [phone].  Transmission confirmation received.

## 2018-10-18 ENCOUNTER — Ambulatory Visit (HOSPITAL_COMMUNITY): Payer: Medicare HMO

## 2018-10-18 ENCOUNTER — Encounter (HOSPITAL_COMMUNITY): Payer: Self-pay | Admitting: Lab

## 2018-10-18 ENCOUNTER — Telehealth: Payer: Self-pay

## 2018-10-18 DIAGNOSIS — G4733 Obstructive sleep apnea (adult) (pediatric): Secondary | ICD-10-CM | POA: Diagnosis not present

## 2018-10-18 NOTE — Telephone Encounter (Signed)
Phone call placed to patient's wife to schedule visit with Palliative care. VM left

## 2018-10-18 NOTE — Progress Notes (Unsigned)
Referral sent to [facility] .  Records on  Faxed 2/6

## 2018-10-19 ENCOUNTER — Ambulatory Visit (HOSPITAL_COMMUNITY): Payer: Medicare HMO

## 2018-10-19 ENCOUNTER — Encounter (HOSPITAL_COMMUNITY): Payer: Self-pay | Admitting: *Deleted

## 2018-10-19 NOTE — Progress Notes (Signed)
Patient's wife called to notify us that they have accepted hospice services. They wish to cancel all future appointments with [place].

## 2018-10-23 ENCOUNTER — Ambulatory Visit: Payer: Self-pay | Admitting: Radiation Oncology

## 2018-10-25 NOTE — Telephone Encounter (Signed)
a 

## 2018-10-26 ENCOUNTER — Ambulatory Visit (HOSPITAL_COMMUNITY): Payer: Medicare HMO

## 2018-11-02 ENCOUNTER — Ambulatory Visit (HOSPITAL_COMMUNITY): Payer: Medicare HMO

## 2018-11-09 ENCOUNTER — Ambulatory Visit (HOSPITAL_COMMUNITY): Payer: Medicare HMO

## 2018-11-11 DEATH — deceased

## 2018-11-15 ENCOUNTER — Encounter (HOSPITAL_COMMUNITY): Payer: Self-pay | Admitting: Hematology

## 2018-11-16 ENCOUNTER — Ambulatory Visit (HOSPITAL_COMMUNITY): Payer: Medicare HMO

## 2018-11-23 ENCOUNTER — Ambulatory Visit (HOSPITAL_COMMUNITY): Payer: Medicare HMO

## 2018-11-30 ENCOUNTER — Ambulatory Visit (HOSPITAL_COMMUNITY): Payer: Medicare HMO

## 2018-12-07 ENCOUNTER — Ambulatory Visit (HOSPITAL_COMMUNITY): Payer: Medicare HMO

## 2018-12-14 ENCOUNTER — Ambulatory Visit (HOSPITAL_COMMUNITY): Payer: Medicare HMO

## 2018-12-20 ENCOUNTER — Ambulatory Visit (HOSPITAL_COMMUNITY): Payer: Medicare HMO

## 2018-12-28 ENCOUNTER — Ambulatory Visit (HOSPITAL_COMMUNITY): Payer: Medicare HMO | Admitting: Hematology

## 2018-12-28 ENCOUNTER — Ambulatory Visit (HOSPITAL_COMMUNITY): Payer: Medicare HMO

## 2019-07-04 IMAGING — CT NM PET TUM IMG INITIAL (PI) SKULL BASE T - THIGH
9 series · 25 of 25 positions shown · non-contrast
Comparison: None.

CLINICAL DATA: Subsequent treatment strategy for lymphadenopathy in
the chest.

EXAM:
NUCLEAR MEDICINE PET SKULL BASE TO THIGH
TECHNIQUE: 8.6 mCi F-18 FDG was injected intravenously. Full-ring PET imaging
was performed from the skull base to thigh after the radiotracer. CT
data was obtained and used for attenuation correction and anatomic
localization.
Fasting blood glucose: 106 mg/dl
Mediastinal blood pool activity: SUV max

[Series 3: pet sk_thigh ac · axial · 5.0mm · 4.07mm/px · z∈[-981,-69]mm · 4 of 229 slices shown]
[im 1/229]
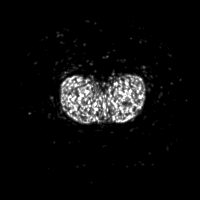
[im 77/229]
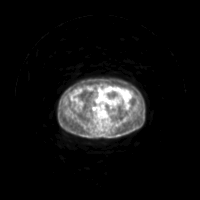
[im 153/229]
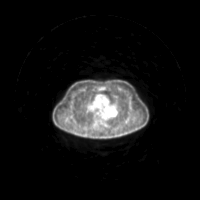
[im 229/229]
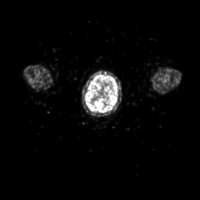

[Series 4: ct sk_thigh 5.0 b31f · axial · 5.0mm · 0.98mm/px · z∈[-981,-69]mm · 4 of 229 slices shown]
[im 1/229]
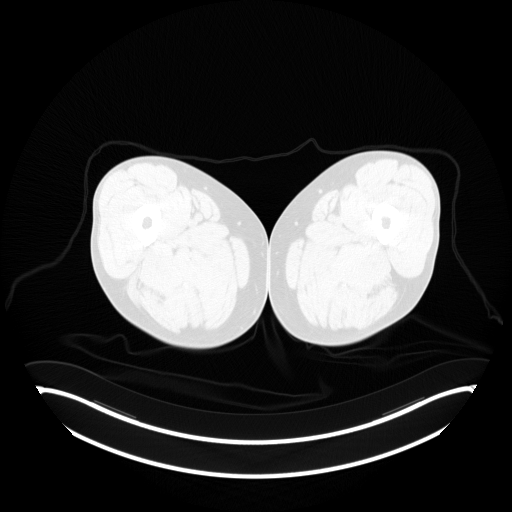
[im 77/229]
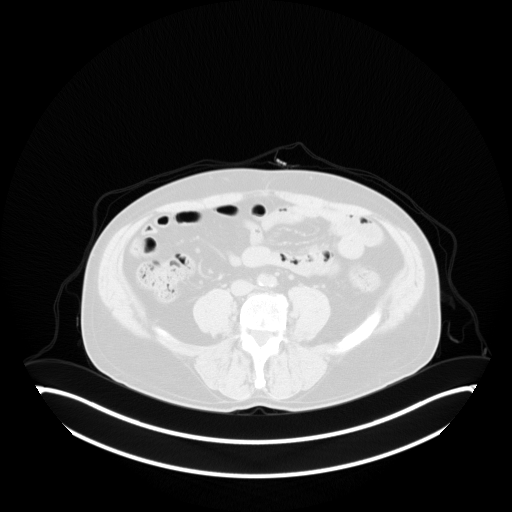
[im 153/229]
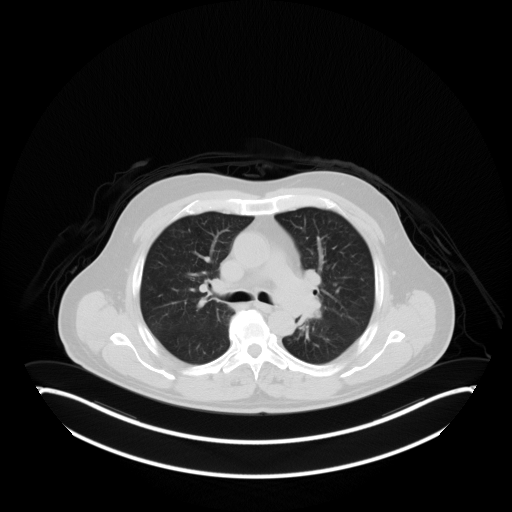
[im 229/229  brain]
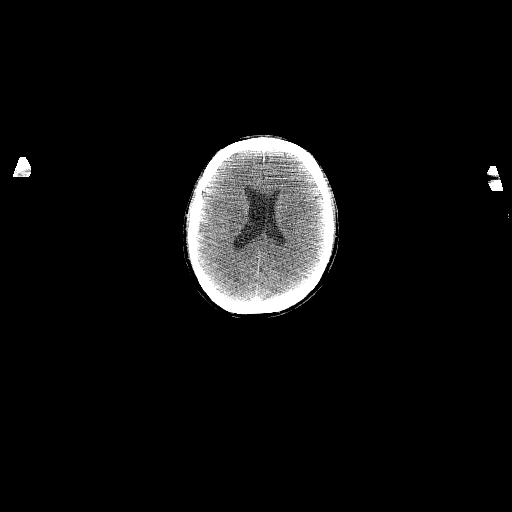

[Series 5: pet sk_thigh nac · axial · 5.0mm · 4.07mm/px · z∈[-981,-69]mm · 5 of 229 slices shown]
[im 1/229]
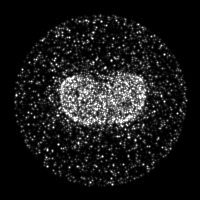
[im 58/229]
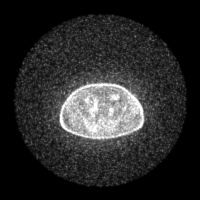
[im 115/229]
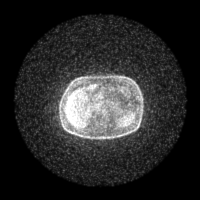
[im 172/229]
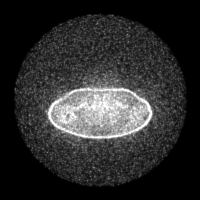
[im 229/229]
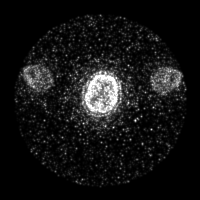

[Series 8: ct sk_thigh 5.0 b70f lung_bone · axial · 5.0mm · 0.68mm/px · z∈[-523,-263]mm · 2 of 66 slices shown]
[im 1/66  bone]
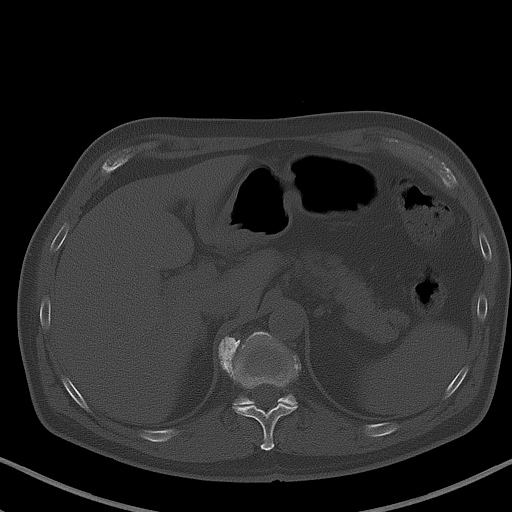
[im 66/66  bone]
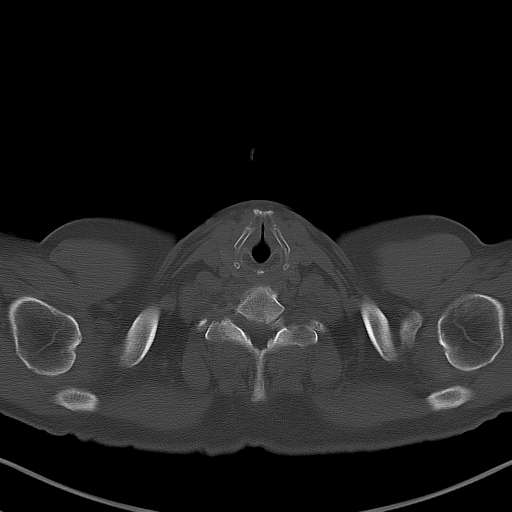

[Series 604: range-ct sk_thigh 5.0 (id)<alpha range(1)> · 2 of 82 slices shown]
[im 1/82]
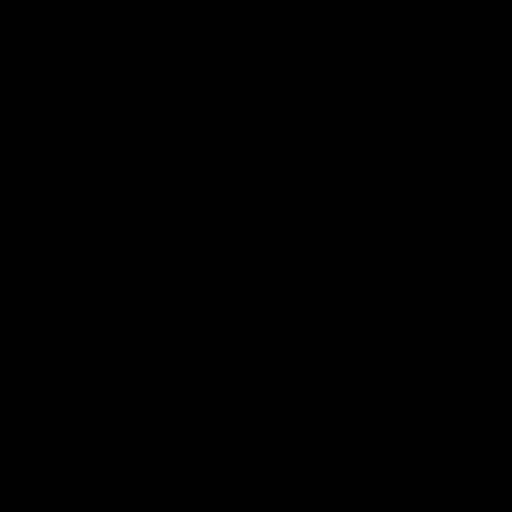
[im 82/82]
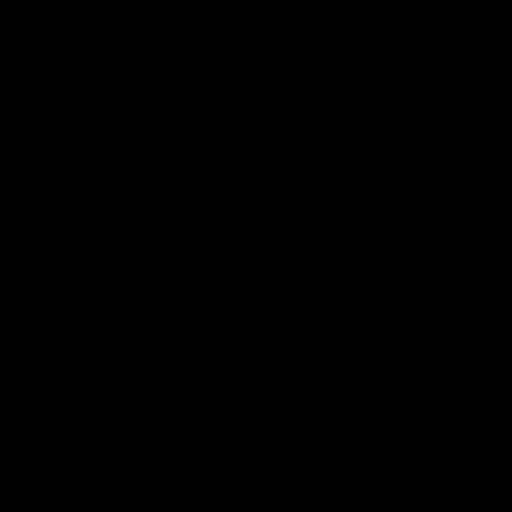

[Series 605: mip range 3 · coronal · 1.89mm/px · 1 of 32 slices shown]
[im 1/32]
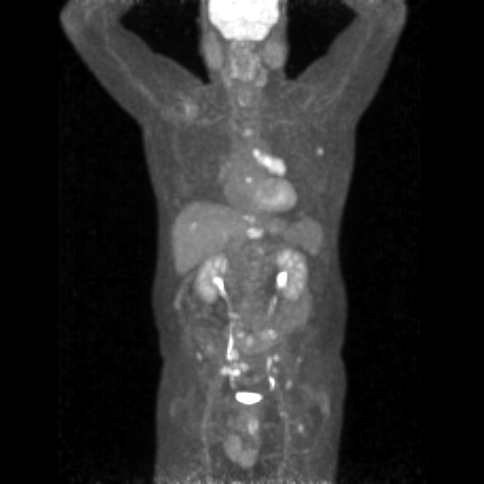

[Series 606: range-ct sk_thigh 5.0 (id)<alpha range> · 5 of 213 slices shown]
[im 1/213]
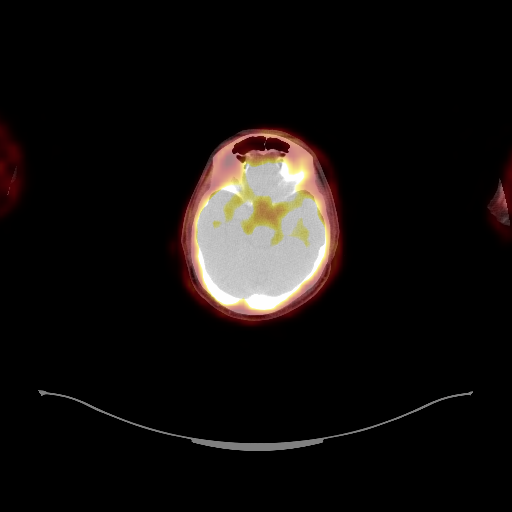
[im 54/213]
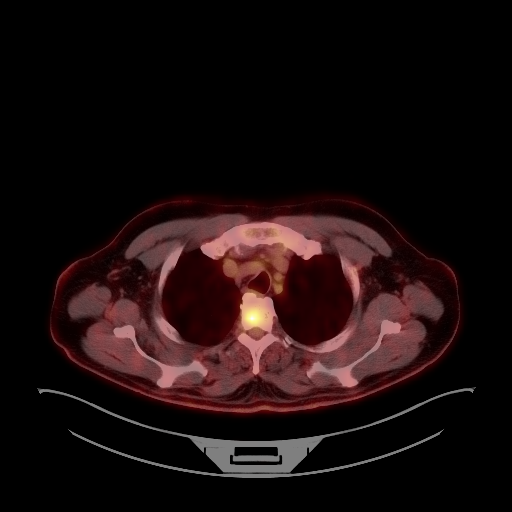
[im 107/213]
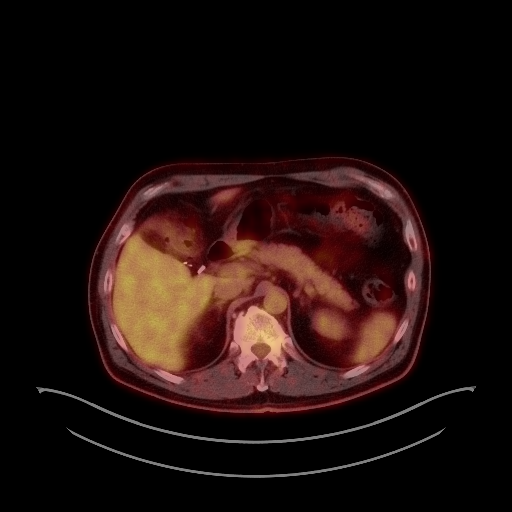
[im 160/213]
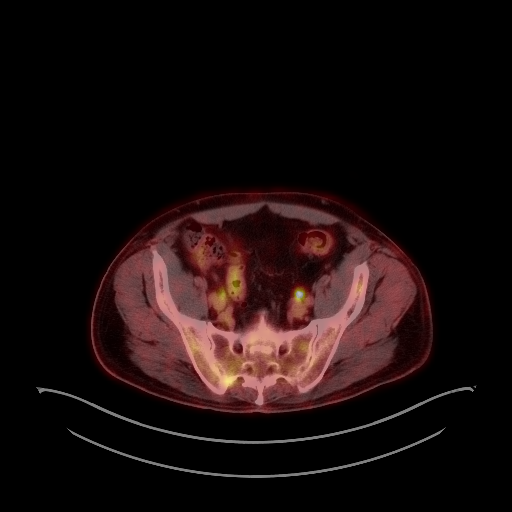
[im 213/213]
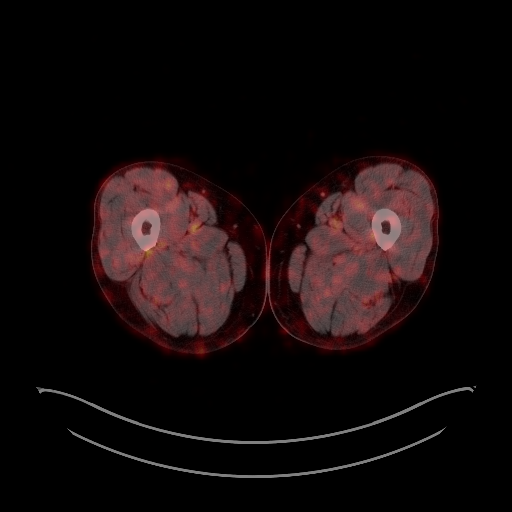

[Series 1140: results mm oncology reading · 5.0mm · 1.06mm/px · 1 of 11 slices shown (1 of 2)]
[im 1/11]
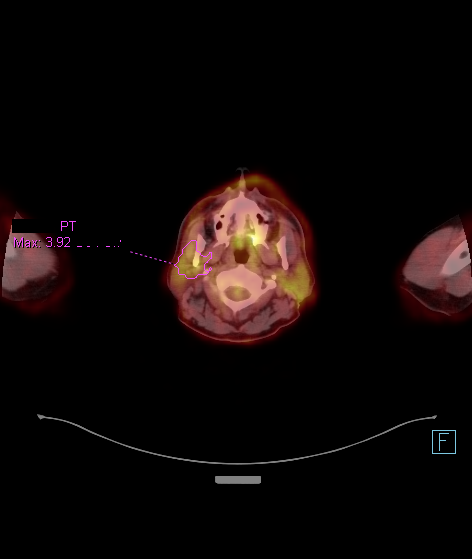

[Series 1246: results mm oncology reading · 5.0mm · 1.31mm/px · 1 of 1 slices shown (2 of 2)]
[im 1/1]
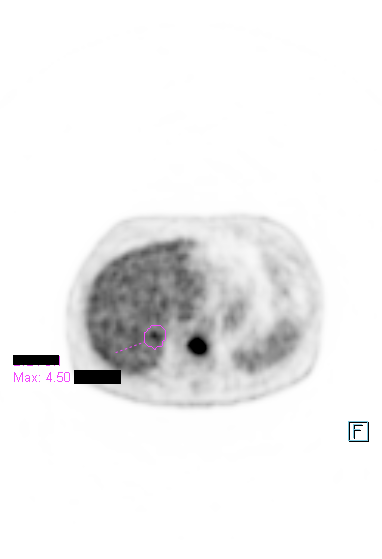

[25 of 25 positions shown; findings below may reference images not displayed]

FINDINGS: NECK: No hypermetabolic lymph nodes in the neck.

Incidental CT findings: none

CHEST: Stable small subpleural nodular densities in the left upper
without hypermetabolism.

Left central lung lesion with left hilar and mediastinal
lymphadenopathy is hypermetabolic with SUV max of 12.7. There is
mass effect on the left lower lobe bronchus and the lesion may be
partially intrabronchial.

No new pulmonary nodules to suggest pulmonary metastatic disease. No
pleural effusion.

Incidental CT findings: none

ABDOMEN/PELVIS: Vague 10 mm low-attenuation lesion in the right
hepatic lobe on image number 109 is hypermetabolic with SUV max of
5.4 and worrisome for a hepatic metastatic lesion. There is an even
smaller lesion more medially which has an SUV max of 4.5 and is
likely an other metastatic focus.

No enlarged or hypermetabolic mesenteric or retroperitoneal lymph
nodes and no adrenal gland lesions.

In the pelvis is an area of hypermetabolism involving the sigmoid
colon. There could be an inflamed diverticulum or possible epiploic
appendagitis. I do not think this represents a neoplastic process.
SUV max is 13.8. Correlation with the patient's colon cancer
screening history is recommended. If screening is not up-to-date,
appropriate screening should be considered.

Incidental CT findings: none

SKELETON: Diffuse osseous metastatic disease is demonstrated.
Numerous lesions involving the spine and pelvis. At L5 there are
lesions in the vertebral bodies, both pedicles and left transverse
process and facet. No spinal canal compromise is demonstrated.

Incidental CT findings: none
IMPRESSION: 1. Left central lung mass compressing the left lower lobe bronchus
with adjacent left hilar and mediastinal lymphadenopathy, all
hypermetabolic and consistent with neoplasm.
2. Diffuse osseous metastatic disease.
3. Hepatic metastatic disease.

## 2019-07-09 NOTE — Progress Notes (Signed)
This encounter was created in error - please disregard.
